# Patient Record
Sex: Female | Born: 1950 | Race: White | Hispanic: No | Marital: Married | Smoking: Current every day smoker
Health system: Southern US, Community
[De-identification: ages and names within clinical notes are randomized; demographics above are authoritative.]

## PROBLEM LIST (undated history)

## (undated) DIAGNOSIS — F329 Major depressive disorder, single episode, unspecified: Secondary | ICD-10-CM

## (undated) DIAGNOSIS — J439 Emphysema, unspecified: Secondary | ICD-10-CM

## (undated) DIAGNOSIS — G709 Myoneural disorder, unspecified: Secondary | ICD-10-CM

## (undated) DIAGNOSIS — E785 Hyperlipidemia, unspecified: Secondary | ICD-10-CM

## (undated) DIAGNOSIS — J45909 Unspecified asthma, uncomplicated: Secondary | ICD-10-CM

## (undated) DIAGNOSIS — I509 Heart failure, unspecified: Secondary | ICD-10-CM

## (undated) DIAGNOSIS — E079 Disorder of thyroid, unspecified: Secondary | ICD-10-CM

## (undated) DIAGNOSIS — Z5189 Encounter for other specified aftercare: Secondary | ICD-10-CM

## (undated) DIAGNOSIS — D649 Anemia, unspecified: Secondary | ICD-10-CM

## (undated) DIAGNOSIS — N189 Chronic kidney disease, unspecified: Secondary | ICD-10-CM

## (undated) DIAGNOSIS — K219 Gastro-esophageal reflux disease without esophagitis: Secondary | ICD-10-CM

## (undated) DIAGNOSIS — R569 Unspecified convulsions: Secondary | ICD-10-CM

## (undated) DIAGNOSIS — F32A Depression, unspecified: Secondary | ICD-10-CM

## (undated) DIAGNOSIS — J449 Chronic obstructive pulmonary disease, unspecified: Secondary | ICD-10-CM

## (undated) DIAGNOSIS — C4490 Unspecified malignant neoplasm of skin, unspecified: Secondary | ICD-10-CM

## (undated) DIAGNOSIS — G4730 Sleep apnea, unspecified: Secondary | ICD-10-CM

## (undated) DIAGNOSIS — M810 Age-related osteoporosis without current pathological fracture: Secondary | ICD-10-CM

## (undated) DIAGNOSIS — M1990 Unspecified osteoarthritis, unspecified site: Secondary | ICD-10-CM

## (undated) DIAGNOSIS — H269 Unspecified cataract: Secondary | ICD-10-CM

## (undated) DIAGNOSIS — T7840XA Allergy, unspecified, initial encounter: Secondary | ICD-10-CM

## (undated) DIAGNOSIS — E119 Type 2 diabetes mellitus without complications: Secondary | ICD-10-CM

## (undated) DIAGNOSIS — I10 Essential (primary) hypertension: Secondary | ICD-10-CM

## (undated) DIAGNOSIS — D689 Coagulation defect, unspecified: Secondary | ICD-10-CM

## (undated) DIAGNOSIS — F419 Anxiety disorder, unspecified: Secondary | ICD-10-CM

## (undated) HISTORY — PX: OTHER SURGICAL HISTORY: SHX169

## (undated) HISTORY — DX: Depression, unspecified: F32.A

## (undated) HISTORY — DX: Unspecified osteoarthritis, unspecified site: M19.90

## (undated) HISTORY — DX: Unspecified cataract: H26.9

## (undated) HISTORY — DX: Hyperlipidemia, unspecified: E78.5

## (undated) HISTORY — DX: Anemia, unspecified: D64.9

## (undated) HISTORY — DX: Emphysema, unspecified: J43.9

## (undated) HISTORY — DX: Gastro-esophageal reflux disease without esophagitis: K21.9

## (undated) HISTORY — PX: APPENDECTOMY: SHX54

## (undated) HISTORY — PX: CAROTID ENDARTERECTOMY: SUR193

## (undated) HISTORY — DX: Major depressive disorder, single episode, unspecified: F32.9

## (undated) HISTORY — DX: Myoneural disorder, unspecified: G70.9

## (undated) HISTORY — DX: Type 2 diabetes mellitus without complications: E11.9

## (undated) HISTORY — DX: Encounter for other specified aftercare: Z51.89

## (undated) HISTORY — PX: JOINT REPLACEMENT: SHX530

## (undated) HISTORY — DX: Chronic obstructive pulmonary disease, unspecified: J44.9

## (undated) HISTORY — DX: Anxiety disorder, unspecified: F41.9

## (undated) HISTORY — PX: CERVICAL SPINE SURGERY: SHX589

## (undated) HISTORY — DX: Essential (primary) hypertension: I10

## (undated) HISTORY — PX: EYE SURGERY: SHX253

## (undated) HISTORY — DX: Unspecified asthma, uncomplicated: J45.909

## (undated) HISTORY — PX: FRACTURE SURGERY: SHX138

## (undated) HISTORY — DX: Heart failure, unspecified: I50.9

## (undated) HISTORY — PX: SPINE SURGERY: SHX786

## (undated) HISTORY — DX: Unspecified convulsions: R56.9

## (undated) HISTORY — DX: Chronic kidney disease, unspecified: N18.9

## (undated) HISTORY — DX: Unspecified malignant neoplasm of skin, unspecified: C44.90

## (undated) HISTORY — DX: Allergy, unspecified, initial encounter: T78.40XA

## (undated) HISTORY — DX: Age-related osteoporosis without current pathological fracture: M81.0

## (undated) HISTORY — DX: Coagulation defect, unspecified: D68.9

## (undated) HISTORY — DX: Disorder of thyroid, unspecified: E07.9

## (undated) HISTORY — DX: Sleep apnea, unspecified: G47.30

---

## 1983-07-13 DIAGNOSIS — G4730 Sleep apnea, unspecified: Secondary | ICD-10-CM

## 1983-07-13 HISTORY — DX: Sleep apnea, unspecified: G47.30

## 2013-07-12 HISTORY — PX: SPINAL CORD STIMULATOR IMPLANT: SHX2422

## 2017-08-24 ENCOUNTER — Encounter (INDEPENDENT_AMBULATORY_CARE_PROVIDER_SITE_OTHER): Payer: Self-pay

## 2017-08-24 ENCOUNTER — Encounter: Payer: Self-pay | Admitting: Family Medicine

## 2017-08-24 ENCOUNTER — Ambulatory Visit: Payer: Medicare PPO | Admitting: Family Medicine

## 2017-08-24 VITALS — BP 124/65 | HR 89 | Temp 98.5°F | Ht 65.0 in | Wt 225.0 lb

## 2017-08-24 DIAGNOSIS — G894 Chronic pain syndrome: Secondary | ICD-10-CM

## 2017-08-24 DIAGNOSIS — J449 Chronic obstructive pulmonary disease, unspecified: Secondary | ICD-10-CM | POA: Insufficient documentation

## 2017-08-24 DIAGNOSIS — E039 Hypothyroidism, unspecified: Secondary | ICD-10-CM

## 2017-08-24 DIAGNOSIS — M19072 Primary osteoarthritis, left ankle and foot: Secondary | ICD-10-CM

## 2017-08-24 DIAGNOSIS — F339 Major depressive disorder, recurrent, unspecified: Secondary | ICD-10-CM | POA: Diagnosis not present

## 2017-08-24 DIAGNOSIS — J439 Emphysema, unspecified: Secondary | ICD-10-CM

## 2017-08-24 DIAGNOSIS — G40909 Epilepsy, unspecified, not intractable, without status epilepticus: Secondary | ICD-10-CM | POA: Diagnosis not present

## 2017-08-24 DIAGNOSIS — E785 Hyperlipidemia, unspecified: Secondary | ICD-10-CM | POA: Diagnosis not present

## 2017-08-24 DIAGNOSIS — I10 Essential (primary) hypertension: Secondary | ICD-10-CM

## 2017-08-24 DIAGNOSIS — M810 Age-related osteoporosis without current pathological fracture: Secondary | ICD-10-CM

## 2017-08-24 NOTE — Progress Notes (Signed)
BP 124/65   Pulse 89   Temp 98.5 F (36.9 C) (Oral)   Ht '5\' 5"'  (1.651 m)   Wt 225 lb (102.1 kg)   BMI 37.44 kg/m    Subjective:    Patient ID: Tasha Clarke, female    DOB: 1951-05-02, 67 y.o.   MRN: [id number]  HPI: Tasha Clarke is a 67 y.o. female presenting on 08/24/2017 for [place] (sees pain management specialist in [place], Dr. Leontine Locket)   HPI Hyperlipidemia Patient comes in to establish care with our office.  Patient is coming in for recheck of his hyperlipidemia. The patient is currently taking Lipitor. They deny any issues with myalgias or history of liver damage from it. They deny any focal numbness or weakness or chest pain.   Hypertension Patient is currently on losartan and metoprolol, and their blood pressure today is 124/65. Patient denies any lightheadedness or dizziness. Patient denies headaches, blurred vision, chest pains, shortness of breath, or weakness. Denies any side effects from medication and is content with current medication.   Hypothyroidism recheck Patient is coming in for thyroid recheck today as well. They deny any issues with hair changes or heat or cold problems or diarrhea or constipation. They deny any chest pain or palpitations. They are currently on levothyroxine 59mcrograms   COPD Has known COPD and is coming to establish care for this.  She is currently on DuoNeb and Brio Ellipta and Advair albuterol.  She has been feeling more stable on this recently and feels like her breathing is doing good.  She still does have cough and congestion and wheezing.  She is chronically on 3 L nasal cannula oxygen.  Osteoporosis Patient says she was just diagnosed with osteoporosis late last year and has been on Fosamax for about 4 or 5 months.  She has no major fractures in the past few years and has been taking the Fosamax along with vitamin D3 and calcium.  Chronic pain syndrome and left ankle arthritis Patient has chronic pain in both ankle and  back and [place] is still planning to be seen there for management of this.  She takes Cymbalta for this and for her depression as well as gabapentin.  She says she is stable on these and will continue to see them for this.  Seizure disorder Patient is currently on [place] seizure disorder and denies any seizures in the last year at least.  She does not currently have a neurologist after moving here and needs one.  Relevant past medical, surgical, family and social history reviewed and updated as indicated. Interim medical history since our last visit reviewed. Allergies and medications reviewed and updated.  Review of Systems  Constitutional: Negative for chills and fever.  HENT: Positive for congestion. Negative for ear discharge, ear pain, postnasal drip, rhinorrhea, sinus pressure, sneezing and sore throat.   Eyes: Negative for pain, redness and visual disturbance.  Respiratory: Positive for cough, shortness of breath and wheezing. Negative for chest tightness.   Cardiovascular: Negative for chest pain, palpitations and leg swelling.  Endocrine: Negative for cold intolerance and heat intolerance.  Genitourinary: Negative for difficulty urinating and dysuria.  Musculoskeletal: Positive for arthralgias and back pain. Negative for gait problem.  Skin: Negative for rash.  Neurological: Negative for seizures, light-headedness and headaches.  Psychiatric/Behavioral: Negative for agitation and behavioral problems.  All other systems reviewed and are negative.   Per HPI unless specifically indicated above  Social History  Socioeconomic History  . Marital status: Married    Spouse name: Not on file  . Number of children: Not on file  . Years of education: Not on file  . Highest education level: Not on file  Social Needs  . Financial resource strain: Not on file  . Food insecurity - worry: Not on file  . Food insecurity - inability: Not on  file  . Transportation needs - medical: Not on file  . Transportation needs - non-medical: Not on file  Occupational History  . Not on file  Tobacco Use  . Smoking status: Current Every Day Smoker    Packs/day: 0.75    Types: Cigarettes  . Smokeless tobacco: Never Used  Substance and Sexual Activity  . Alcohol use: No    Frequency: Never  . Drug use: No  . Sexual activity: Not on file  Other Topics Concern  . Not on file  Social History Narrative  . Not on file    Past Surgical History:  Procedure Laterality Date  . APPENDECTOMY    . EYE SURGERY    . FRACTURE SURGERY    . heart stents    . JOINT REPLACEMENT    . SPINAL CORD STIMULATOR IMPLANT  2015  . SPINE SURGERY      Family History  Problem Relation Age of Onset  . Arthritis Mother   . Asthma Mother   . Cancer Mother        lung  . Depression Mother   . Diabetes Mother   . Arthritis Father   . Asthma Father   . Depression Father   . Asthma Sister   . Depression Sister   . Diabetes Sister   . Arthritis Maternal Grandmother   . Asthma Maternal Grandmother   . Depression Maternal Grandmother   . Arthritis Maternal Grandfather   . Asthma Maternal Grandfather   . Arthritis Paternal Grandmother   . Asthma Paternal Grandmother   . Arthritis Paternal Grandfather   . Asthma Paternal Grandfather   . COPD Paternal Grandfather     Allergies as of 08/24/2017      Reactions   Cephalexin Hives   Ketorolac Tromethamine Hives   Lac Bovis Nausea And Vomiting   Imdur  [isosorbide Dinitrate]    Iodinated Diagnostic Agents    Other Other (See Comments)   Alka seltzer causes blurred vision, fainting   Sodium Bicarbonate-citric Acid    Milk-related Compounds Nausea And Vomiting      Medication List        Accurate as of 08/24/17 10:52 AM. Always use your most recent med list.          albuterol 108 (90 Base) MCG/ACT inhaler Commonly known as:  PROVENTIL HFA;VENTOLIN HFA Inhale 2 puffs into the lungs every 6  (six) hours as needed for wheezing or shortness of breath.   alendronate 70 MG tablet Commonly known as:  FOSAMAX Take 70 mg by mouth once a week. Take with a full glass of water on an empty stomach.   aspirin EC 81 MG tablet Take 81 mg by mouth daily.   atorvastatin 40 MG tablet Commonly known as:  LIPITOR Take 40 mg by mouth daily.   BREO ELLIPTA 200-25 MCG/INH Aepb Generic drug:  fluticasone furoate-vilanterol Inhale 1 puff into the lungs daily.   cetirizine 10 MG tablet Commonly known as:  ZYRTEC Take 10 mg by mouth daily.   cyclobenzaprine 10 MG tablet Commonly known as:  FLEXERIL Take 10 mg  by mouth 2 (two) times daily as needed for muscle spasms.   DULoxetine 30 MG capsule Commonly known as:  CYMBALTA Take 30 mg by mouth daily.   EPINEPHrine 0.3 mg/0.3 mL Soaj injection Commonly known as:  EPI-PEN Inject 0.3 mg into the muscle as needed.   Fish Oil 1000 MG Caps Take 1,000 mg by mouth daily.   FLONASE 50 MCG/ACT nasal spray Generic drug:  fluticasone Place 2 sprays into both nostrils daily.   Fluticasone-Salmeterol 250-50 MCG/DOSE Aepb Commonly known as:  ADVAIR Inhale 1 puff into the lungs 2 (two) times daily.   gabapentin 600 MG tablet Commonly known as:  NEURONTIN Take 600 mg by mouth 4 (four) times daily.   ipratropium-albuterol 0.5-2.5 (3) MG/3ML Soln Commonly known as:  DUONEB Take 3 mLs by nebulization every 6 (six) hours as needed.   Iron 325 (65 Fe) MG Tabs Take 68 mg by mouth daily.   levETIRAcetam 750 MG tablet Commonly known as:  KEPPRA Take 750 mg by mouth every morning.   levETIRAcetam 1000 MG tablet Commonly known as:  KEPPRA Take 1,000 mg by mouth at bedtime.   levothyroxine 50 MCG tablet Commonly known as:  SYNTHROID, LEVOTHROID Take 50 mcg by mouth daily before breakfast.   losartan 50 MG tablet Commonly known as:  COZAAR Take 50 mg by mouth daily.   Magnesium 400 MG Caps Take 400 mg by mouth daily.   metoprolol  tartrate 25 MG tablet Commonly known as:  LOPRESSOR Take 25 mg by mouth 2 (two) times daily.   Morphine-Naltrexone 20-0.8 MG Cpcr Take 1 capsule by mouth 2 (two) times daily.   multivitamin tablet Take 1 tablet by mouth daily.   NARCAN 4 MG/0.1ML Liqd nasal spray kit Generic drug:  naloxone Place 1 spray into the nose.   TYLENOL PM EXTRA STRENGTH 25-500 MG Tabs tablet Generic drug:  diphenhydramine-acetaminophen Take 1 tablet by mouth at bedtime as needed.   Vitamin D-3 5000 units Tabs Take 5,000 Units by mouth daily.          Objective:    BP 124/65   Pulse 89   Temp 98.5 F (36.9 C) (Oral)   Ht '5\' 5"'  (1.651 m)   Wt 225 lb (102.1 kg)   BMI 37.44 kg/m   Wt Readings from Last 3 Encounters:  08/24/17 225 lb (102.1 kg)    Physical Exam  Constitutional: She is oriented to person, place, and time. She appears well-developed and well-nourished. No distress.  Eyes: Conjunctivae are normal.  Neck: Neck supple. No thyromegaly present.  Cardiovascular: Normal rate, regular rhythm, normal heart sounds and intact distal pulses.  No murmur heard. Pulmonary/Chest: Effort normal. No respiratory distress. She has wheezes. She has no rales. She exhibits no tenderness.  Musculoskeletal: Normal range of motion. She exhibits edema (Trace edema).  Lymphadenopathy:    She has no cervical adenopathy.  Neurological: She is alert and oriented to person, place, and time. Coordination normal.  Skin: Skin is warm and dry. No rash noted. She is not diaphoretic.  Psychiatric: She has a normal mood and affect. Her behavior is normal.  Nursing note and vitals reviewed.   No results found for this or any previous visit.    Assessment & Plan:   Problem List Items Addressed This Visit      Cardiovascular and Mediastinum   Hypertension - Primary   Relevant Medications   atorvastatin (LIPITOR) 40 MG tablet   losartan (COZAAR) 50 MG tablet   metoprolol  tartrate (LOPRESSOR) 25 MG tablet    EPINEPHrine 0.3 mg/0.3 mL IJ SOAJ injection   aspirin EC 81 MG tablet     Respiratory   COPD (chronic obstructive pulmonary disease) (HCC)   Relevant Medications   fluticasone (FLONASE) 50 MCG/ACT nasal spray   fluticasone furoate-vilanterol (BREO ELLIPTA) 200-25 MCG/INH AEPB   cetirizine (ZYRTEC) 10 MG tablet   ipratropium-albuterol (DUONEB) 0.5-2.5 (3) MG/3ML SOLN   albuterol (PROVENTIL HFA;VENTOLIN HFA) 108 (90 Base) MCG/ACT inhaler   Fluticasone-Salmeterol (ADVAIR) 250-50 MCG/DOSE AEPB   Other Relevant Orders   CBC with Differential/Platelet (Completed)   Ambulatory referral to Pulmonology     Endocrine   Hypothyroid   Relevant Medications   levothyroxine (SYNTHROID, LEVOTHROID) 50 MCG tablet   metoprolol tartrate (LOPRESSOR) 25 MG tablet   Other Relevant Orders   TSH (Completed)     Nervous and Auditory   Seizure disorder (HCC)   Relevant Medications   gabapentin (NEURONTIN) 600 MG tablet   levETIRAcetam (KEPPRA) 750 MG tablet   levETIRAcetam (KEPPRA) 1000 MG tablet   diphenhydramine-acetaminophen (TYLENOL PM EXTRA STRENGTH) 25-500 MG TABS tablet   Other Relevant Orders   CMP14+EGFR (Completed)     Musculoskeletal and Integument   Osteoporosis   Relevant Medications   alendronate (FOSAMAX) 70 MG tablet   Cholecalciferol (VITAMIN D-3) 5000 units TABS     Other   Hyperlipidemia LDL goal <100   Relevant Medications   atorvastatin (LIPITOR) 40 MG tablet   losartan (COZAAR) 50 MG tablet   metoprolol tartrate (LOPRESSOR) 25 MG tablet   EPINEPHrine 0.3 mg/0.3 mL IJ SOAJ injection   aspirin EC 81 MG tablet   Other Relevant Orders   Lipid panel (Completed)   Depression, recurrent (HCC)   Relevant Medications   DULoxetine (CYMBALTA) 30 MG capsule   Other Relevant Orders   CBC with Differential/Platelet (Completed)   Ambulatory referral to Psychiatry   Chronic pain syndrome    Other Visit Diagnoses    Arthritis of left ankle       Relevant Medications    cyclobenzaprine (FLEXERIL) 10 MG tablet   Morphine-Naltrexone 20-0.8 MG CPCR   aspirin EC 81 MG tablet   Other Relevant Orders   Ambulatory referral to Orthopedic Surgery       Follow up plan: Return in about 4 weeks (around 09/21/2017), or if symptoms worsen or fail to improve, for depression recheck and htn .  Caryl Pina, MD [facility] 08/24/2017, 10:52 AM

## 2017-08-25 ENCOUNTER — Inpatient Hospital Stay (HOSPITAL_COMMUNITY)
Admission: EM | Admit: 2017-08-25 | Discharge: 2017-08-27 | DRG: 812 | Disposition: A | Discharge status: Home / Self Care | Payer: Medicare Other | Attending: Family Medicine | Admitting: Family Medicine

## 2017-08-25 ENCOUNTER — Encounter (HOSPITAL_COMMUNITY): Payer: Self-pay | Admitting: Emergency Medicine

## 2017-08-25 ENCOUNTER — Emergency Department (HOSPITAL_COMMUNITY): Payer: Medicare Other

## 2017-08-25 ENCOUNTER — Other Ambulatory Visit: Payer: Self-pay

## 2017-08-25 ENCOUNTER — Inpatient Hospital Stay (HOSPITAL_COMMUNITY): Payer: Medicare Other

## 2017-08-25 ENCOUNTER — Encounter: Payer: Self-pay | Admitting: Family Medicine

## 2017-08-25 DIAGNOSIS — E039 Hypothyroidism, unspecified: Secondary | ICD-10-CM | POA: Diagnosis present

## 2017-08-25 DIAGNOSIS — Z79899 Other long term (current) drug therapy: Secondary | ICD-10-CM | POA: Diagnosis not present

## 2017-08-25 DIAGNOSIS — G894 Chronic pain syndrome: Secondary | ICD-10-CM | POA: Diagnosis present

## 2017-08-25 DIAGNOSIS — Z7982 Long term (current) use of aspirin: Secondary | ICD-10-CM | POA: Diagnosis not present

## 2017-08-25 DIAGNOSIS — F329 Major depressive disorder, single episode, unspecified: Secondary | ICD-10-CM | POA: Diagnosis present

## 2017-08-25 DIAGNOSIS — F17210 Nicotine dependence, cigarettes, uncomplicated: Secondary | ICD-10-CM | POA: Diagnosis present

## 2017-08-25 DIAGNOSIS — Z91041 Radiographic dye allergy status: Secondary | ICD-10-CM

## 2017-08-25 DIAGNOSIS — Z91018 Allergy to other foods: Secondary | ICD-10-CM

## 2017-08-25 DIAGNOSIS — D649 Anemia, unspecified: Principal | ICD-10-CM

## 2017-08-25 DIAGNOSIS — E785 Hyperlipidemia, unspecified: Secondary | ICD-10-CM | POA: Diagnosis present

## 2017-08-25 DIAGNOSIS — Z833 Family history of diabetes mellitus: Secondary | ICD-10-CM | POA: Diagnosis not present

## 2017-08-25 DIAGNOSIS — Z818 Family history of other mental and behavioral disorders: Secondary | ICD-10-CM

## 2017-08-25 DIAGNOSIS — Z825 Family history of asthma and other chronic lower respiratory diseases: Secondary | ICD-10-CM | POA: Diagnosis not present

## 2017-08-25 DIAGNOSIS — D509 Iron deficiency anemia, unspecified: Secondary | ICD-10-CM

## 2017-08-25 DIAGNOSIS — I509 Heart failure, unspecified: Secondary | ICD-10-CM

## 2017-08-25 DIAGNOSIS — J309 Allergic rhinitis, unspecified: Secondary | ICD-10-CM | POA: Diagnosis present

## 2017-08-25 DIAGNOSIS — Z7983 Long term (current) use of bisphosphonates: Secondary | ICD-10-CM | POA: Diagnosis not present

## 2017-08-25 DIAGNOSIS — R0602 Shortness of breath: Secondary | ICD-10-CM | POA: Diagnosis present

## 2017-08-25 DIAGNOSIS — Z79890 Hormone replacement therapy (postmenopausal): Secondary | ICD-10-CM

## 2017-08-25 DIAGNOSIS — Z888 Allergy status to other drugs, medicaments and biological substances status: Secondary | ICD-10-CM

## 2017-08-25 DIAGNOSIS — J449 Chronic obstructive pulmonary disease, unspecified: Secondary | ICD-10-CM | POA: Diagnosis present

## 2017-08-25 DIAGNOSIS — M810 Age-related osteoporosis without current pathological fracture: Secondary | ICD-10-CM | POA: Diagnosis present

## 2017-08-25 DIAGNOSIS — Z955 Presence of coronary angioplasty implant and graft: Secondary | ICD-10-CM

## 2017-08-25 DIAGNOSIS — I110 Hypertensive heart disease with heart failure: Secondary | ICD-10-CM | POA: Diagnosis present

## 2017-08-25 DIAGNOSIS — F339 Major depressive disorder, recurrent, unspecified: Secondary | ICD-10-CM | POA: Diagnosis present

## 2017-08-25 DIAGNOSIS — G40909 Epilepsy, unspecified, not intractable, without status epilepticus: Secondary | ICD-10-CM | POA: Diagnosis present

## 2017-08-25 LAB — URINALYSIS, ROUTINE W REFLEX MICROSCOPIC
BILIRUBIN URINE: NEGATIVE
Glucose, UA: NEGATIVE mg/dL
Hgb urine dipstick: NEGATIVE
Ketones, ur: NEGATIVE mg/dL
Nitrite: POSITIVE — AB
PROTEIN: NEGATIVE mg/dL
SPECIFIC GRAVITY, URINE: 1.008 (ref 1.005–1.030)
pH: 5 (ref 5.0–8.0)

## 2017-08-25 LAB — RETICULOCYTES
RBC.: 3.3 MIL/uL — ABNORMAL LOW (ref 3.87–5.11)
Retic Count, Absolute: 85.8 10*3/uL (ref 19.0–186.0)
Retic Ct Pct: 2.6 % (ref 0.4–3.1)

## 2017-08-25 LAB — CMP14+EGFR
ALK PHOS: 92 IU/L (ref 39–117)
ALT: 14 IU/L (ref 0–32)
AST: 19 IU/L (ref 0–40)
Albumin/Globulin Ratio: 1.3 (ref 1.2–2.2)
Albumin: 3.5 g/dL — ABNORMAL LOW (ref 3.6–4.8)
BUN / CREAT RATIO: 24 (ref 12–28)
BUN: 20 mg/dL (ref 8–27)
Bilirubin Total: <0.2 mg/dL (ref 0.0–1.2)
CO2: 20 mmol/L (ref 20–29)
Calcium: 9.2 mg/dL (ref 8.7–10.3)
Chloride: 106 mmol/L (ref 96–106)
Creatinine, Ser: 0.82 mg/dL (ref 0.57–1.00)
GFR calc Af Amer: 87 mL/min/{1.73_m2} (ref >59)
GFR, EST NON AFRICAN AMERICAN: 75 mL/min/{1.73_m2} (ref >59)
GLOBULIN, TOTAL: 2.7 g/dL (ref 1.5–4.5)
Glucose: 80 mg/dL (ref 65–99)
Potassium: 5.2 mmol/L (ref 3.5–5.2)
Sodium: 139 mmol/L (ref 134–144)
Total Protein: 6.2 g/dL (ref 6.0–8.5)

## 2017-08-25 LAB — CBC WITH DIFFERENTIAL/PLATELET
BASOS: 0 %
Basophils Absolute: 0 10*3/uL (ref 0.0–0.2)
Basophils Absolute: 0 10*3/uL (ref 0.0–0.1)
Basophils Relative: 0 %
EOS (ABSOLUTE): 0.2 10*3/uL (ref 0.0–0.4)
EOS PCT: 4 %
EOS: 3 %
Eosinophils Absolute: 0.3 10*3/uL (ref 0.0–0.7)
HCT: 24.8 % — ABNORMAL LOW (ref 36.0–46.0)
HEMATOCRIT: 24.1 % — AB (ref 34.0–46.6)
Hemoglobin: 6.6 g/dL — CL (ref 11.1–15.9)
Hemoglobin: 6.7 g/dL — CL (ref 12.0–15.0)
Immature Grans (Abs): 0 10*3/uL (ref 0.0–0.1)
Immature Granulocytes: 0 %
LYMPHS ABS: 1.5 10*3/uL (ref 0.7–3.1)
LYMPHS ABS: 1.7 10*3/uL (ref 0.7–4.0)
LYMPHS PCT: 21 %
Lymphs: 21 %
MCH: 20.1 pg — ABNORMAL LOW (ref 26.6–33.0)
MCH: 20.7 pg — AB (ref 26.0–34.0)
MCHC: 27.4 g/dL — AB (ref 31.5–35.7)
MCHC: 27 g/dL — ABNORMAL LOW (ref 30.0–36.0)
MCV: 74 fL — AB (ref 79–97)
MCV: 76.5 fL — ABNORMAL LOW (ref 78.0–100.0)
MONO ABS: 0.8 10*3/uL (ref 0.1–1.0)
MONOS ABS: 0.9 10*3/uL (ref 0.1–0.9)
MONOS PCT: 10 %
Monocytes: 12 %
NEUTROS ABS: 4.5 10*3/uL (ref 1.4–7.0)
Neutro Abs: 5.4 10*3/uL (ref 1.7–7.7)
Neutrophils Relative %: 65 %
Neutrophils: 64 %
PLATELETS: 301 10*3/uL (ref 150–400)
Platelets: 259 10*3/uL (ref 150–379)
RBC: 3.28 x10E6/uL — ABNORMAL LOW (ref 3.77–5.28)
RBC: 3.24 MIL/uL — ABNORMAL LOW (ref 3.87–5.11)
RDW: 20.3 % — AB (ref 12.3–15.4)
RDW: 20.5 % — AB (ref 11.5–15.5)
WBC: 7.1 10*3/uL (ref 3.4–10.8)
WBC: 8.2 10*3/uL (ref 4.0–10.5)

## 2017-08-25 LAB — COMPREHENSIVE METABOLIC PANEL
ALBUMIN: 3.5 g/dL (ref 3.5–5.0)
ALK PHOS: 92 U/L (ref 38–126)
ALT: 15 U/L (ref 14–54)
ANION GAP: 9 (ref 5–15)
AST: 22 U/L (ref 15–41)
BUN: 24 mg/dL — ABNORMAL HIGH (ref 6–20)
CO2: 24 mmol/L (ref 22–32)
Calcium: 9.3 mg/dL (ref 8.9–10.3)
Chloride: 105 mmol/L (ref 101–111)
Creatinine, Ser: 0.95 mg/dL (ref 0.44–1.00)
GFR calc Af Amer: >60 mL/min (ref >60)
GFR calc non Af Amer: >60 mL/min (ref >60)
Glucose, Bld: 108 mg/dL — ABNORMAL HIGH (ref 65–99)
POTASSIUM: 4.2 mmol/L (ref 3.5–5.1)
SODIUM: 138 mmol/L (ref 135–145)
Total Bilirubin: 0.4 mg/dL (ref 0.3–1.2)
Total Protein: 6.8 g/dL (ref 6.5–8.1)

## 2017-08-25 LAB — LIPID PANEL
CHOL/HDL RATIO: 5.2 ratio — AB (ref 0.0–4.4)
Cholesterol, Total: 134 mg/dL (ref 100–199)
HDL: 26 mg/dL — ABNORMAL LOW (ref >39)
LDL Calculated: 75 mg/dL (ref 0–99)
TRIGLYCERIDES: 164 mg/dL — AB (ref 0–149)
VLDL Cholesterol Cal: 33 mg/dL (ref 5–40)

## 2017-08-25 LAB — TSH: TSH: 3.5 u[IU]/mL (ref 0.450–4.500)

## 2017-08-25 LAB — BRAIN NATRIURETIC PEPTIDE: B Natriuretic Peptide: 202.3 pg/mL — ABNORMAL HIGH (ref 0.0–100.0)

## 2017-08-25 LAB — ABO/RH: ABO/RH(D): B POS

## 2017-08-25 LAB — TROPONIN I: TROPONIN I: 0.03 ng/mL — AB (ref <0.03)

## 2017-08-25 LAB — SAVE SMEAR

## 2017-08-25 LAB — POC OCCULT BLOOD, ED: Fecal Occult Bld: NEGATIVE

## 2017-08-25 LAB — DIRECT ANTIGLOBULIN TEST (NOT AT ARMC)
DAT, IgG: NEGATIVE
DAT, complement: NEGATIVE

## 2017-08-25 LAB — PREPARE RBC (CROSSMATCH)

## 2017-08-25 LAB — LACTATE DEHYDROGENASE: LDH: 153 U/L (ref 98–192)

## 2017-08-25 MED ORDER — VITAMIN D 1000 UNITS PO TABS
5000.0000 [IU] | ORAL_TABLET | Freq: Every day | ORAL | Status: DC
  Administered 2017-08-26 – 2017-08-27 (×2): 5000 [IU] via ORAL
  Filled 2017-08-25 (×2): qty 5

## 2017-08-25 MED ORDER — OXYCODONE HCL 5 MG PO TABS
5.0000 mg | ORAL_TABLET | ORAL | Status: DC | PRN
  Administered 2017-08-25 – 2017-08-27 (×4): 5 mg via ORAL
  Filled 2017-08-25 (×4): qty 1

## 2017-08-25 MED ORDER — DULOXETINE HCL 30 MG PO CPEP
30.0000 mg | ORAL_CAPSULE | Freq: Every day | ORAL | Status: DC
  Administered 2017-08-25 – 2017-08-27 (×3): 30 mg via ORAL
  Filled 2017-08-25 (×3): qty 1

## 2017-08-25 MED ORDER — ACETAMINOPHEN 500 MG PO TABS: 500.0000 mg | ORAL_TABLET | Freq: Every evening | ORAL | Status: DC | PRN

## 2017-08-25 MED ORDER — ASPIRIN EC 81 MG PO TBEC
81.0000 mg | DELAYED_RELEASE_TABLET | Freq: Every day | ORAL | Status: DC
  Administered 2017-08-25 – 2017-08-27 (×3): 81 mg via ORAL
  Filled 2017-08-25 (×3): qty 1

## 2017-08-25 MED ORDER — LEVETIRACETAM 500 MG PO TABS
750.0000 mg | ORAL_TABLET | Freq: Every day | ORAL | Status: DC
  Administered 2017-08-26 – 2017-08-27 (×2): 750 mg via ORAL
  Filled 2017-08-25 (×2): qty 1

## 2017-08-25 MED ORDER — ALBUTEROL SULFATE HFA 108 (90 BASE) MCG/ACT IN AERS: 2.0000 | INHALATION_SPRAY | Freq: Four times a day (QID) | RESPIRATORY_TRACT | Status: DC | PRN

## 2017-08-25 MED ORDER — METOPROLOL TARTRATE 25 MG PO TABS
25.0000 mg | ORAL_TABLET | Freq: Two times a day (BID) | ORAL | Status: DC
  Administered 2017-08-25 – 2017-08-26 (×3): 25 mg via ORAL
  Filled 2017-08-25 (×4): qty 1

## 2017-08-25 MED ORDER — ONDANSETRON HCL 4 MG/2ML IJ SOLN: 4.0000 mg | Freq: Four times a day (QID) | INTRAMUSCULAR | Status: DC | PRN

## 2017-08-25 MED ORDER — FERROUS SULFATE 325 (65 FE) MG PO TABS
325.0000 mg | ORAL_TABLET | Freq: Every day | ORAL | Status: DC
  Administered 2017-08-26: 325 mg via ORAL
  Filled 2017-08-25: qty 1

## 2017-08-25 MED ORDER — GABAPENTIN 300 MG PO CAPS
600.0000 mg | ORAL_CAPSULE | Freq: Four times a day (QID) | ORAL | Status: DC
  Administered 2017-08-25 – 2017-08-27 (×9): 600 mg via ORAL
  Filled 2017-08-25 (×9): qty 2

## 2017-08-25 MED ORDER — ACETAMINOPHEN 325 MG PO TABS: 650.0000 mg | ORAL_TABLET | Freq: Four times a day (QID) | ORAL | Status: DC | PRN

## 2017-08-25 MED ORDER — CYCLOBENZAPRINE HCL 10 MG PO TABS: 10.0000 mg | ORAL_TABLET | Freq: Two times a day (BID) | ORAL | Status: DC | PRN

## 2017-08-25 MED ORDER — ENOXAPARIN SODIUM 60 MG/0.6ML ~~LOC~~ SOLN
50.0000 mg | SUBCUTANEOUS | Status: DC
  Administered 2017-08-25 – 2017-08-26 (×2): 50 mg via SUBCUTANEOUS
  Filled 2017-08-25 (×2): qty 0.6

## 2017-08-25 MED ORDER — OMEGA-3-ACID ETHYL ESTERS 1 G PO CAPS
1.0000 g | ORAL_CAPSULE | Freq: Two times a day (BID) | ORAL | Status: DC
  Administered 2017-08-25 – 2017-08-27 (×4): 1 g via ORAL
  Filled 2017-08-25 (×4): qty 1

## 2017-08-25 MED ORDER — IRON 325 (65 FE) MG PO TABS: 68.0000 mg | ORAL_TABLET | Freq: Every day | ORAL | Status: DC

## 2017-08-25 MED ORDER — MORPHINE-NALTREXONE 20-0.8 MG PO CPCR
1.0000 | ORAL_CAPSULE | Freq: Two times a day (BID) | ORAL | Status: DC
  Administered 2017-08-26 (×3): 1 via ORAL

## 2017-08-25 MED ORDER — LOSARTAN POTASSIUM 50 MG PO TABS
50.0000 mg | ORAL_TABLET | Freq: Every day | ORAL | Status: DC
  Administered 2017-08-25 – 2017-08-27 (×3): 50 mg via ORAL
  Filled 2017-08-25 (×3): qty 1

## 2017-08-25 MED ORDER — FLUTICASONE FUROATE-VILANTEROL 200-25 MCG/INH IN AEPB
1.0000 | INHALATION_SPRAY | Freq: Every day | RESPIRATORY_TRACT | Status: DC
  Filled 2017-08-25: qty 28

## 2017-08-25 MED ORDER — DIPHENHYDRAMINE HCL 25 MG PO CAPS: 25.0000 mg | ORAL_CAPSULE | Freq: Every evening | ORAL | Status: DC | PRN

## 2017-08-25 MED ORDER — ALBUTEROL SULFATE (2.5 MG/3ML) 0.083% IN NEBU: 2.5000 mg | INHALATION_SOLUTION | Freq: Four times a day (QID) | RESPIRATORY_TRACT | Status: DC | PRN

## 2017-08-25 MED ORDER — FLUTICASONE PROPIONATE 50 MCG/ACT NA SUSP
2.0000 | Freq: Every day | NASAL | Status: DC
  Filled 2017-08-25: qty 16

## 2017-08-25 MED ORDER — LEVETIRACETAM 500 MG PO TABS
1000.0000 mg | ORAL_TABLET | Freq: Every day | ORAL | Status: DC
  Administered 2017-08-25 – 2017-08-26 (×2): 1000 mg via ORAL
  Filled 2017-08-25 (×2): qty 2

## 2017-08-25 MED ORDER — ATORVASTATIN CALCIUM 40 MG PO TABS
40.0000 mg | ORAL_TABLET | Freq: Every day | ORAL | Status: DC
  Administered 2017-08-25 – 2017-08-27 (×3): 40 mg via ORAL
  Filled 2017-08-25 (×3): qty 1

## 2017-08-25 MED ORDER — IPRATROPIUM-ALBUTEROL 0.5-2.5 (3) MG/3ML IN SOLN
3.0000 mL | Freq: Once | RESPIRATORY_TRACT | Status: AC
  Administered 2017-08-25: 3 mL via RESPIRATORY_TRACT
  Filled 2017-08-25: qty 3

## 2017-08-25 MED ORDER — ALENDRONATE SODIUM 70 MG PO TABS: 70.0000 mg | ORAL_TABLET | ORAL | Status: DC

## 2017-08-25 MED ORDER — FUROSEMIDE 10 MG/ML IJ SOLN
40.0000 mg | Freq: Once | INTRAMUSCULAR | Status: AC
  Administered 2017-08-25: 40 mg via INTRAVENOUS
  Filled 2017-08-25: qty 4

## 2017-08-25 MED ORDER — LORATADINE 10 MG PO TABS
10.0000 mg | ORAL_TABLET | Freq: Every day | ORAL | Status: DC
  Administered 2017-08-26 – 2017-08-27 (×2): 10 mg via ORAL
  Filled 2017-08-25 (×2): qty 1

## 2017-08-25 MED ORDER — ADULT MULTIVITAMIN W/MINERALS CH
1.0000 | ORAL_TABLET | Freq: Every day | ORAL | Status: DC
  Administered 2017-08-26 – 2017-08-27 (×2): 1 via ORAL
  Filled 2017-08-25 (×2): qty 1

## 2017-08-25 MED ORDER — SODIUM CHLORIDE 0.9 % IV SOLN
Freq: Once | INTRAVENOUS | Status: AC
  Administered 2017-08-25: 15:00:00 via INTRAVENOUS

## 2017-08-25 MED ORDER — DIPHENHYDRAMINE-APAP (SLEEP) 25-500 MG PO TABS: 1.0000 | ORAL_TABLET | Freq: Every evening | ORAL | Status: DC | PRN

## 2017-08-25 MED ORDER — FUROSEMIDE 10 MG/ML IJ SOLN
40.0000 mg | Freq: Two times a day (BID) | INTRAMUSCULAR | Status: DC
  Administered 2017-08-25 – 2017-08-27 (×5): 40 mg via INTRAVENOUS
  Filled 2017-08-25 (×5): qty 4

## 2017-08-25 MED ORDER — POLYETHYLENE GLYCOL 3350 17 G PO PACK: 17.0000 g | PACK | Freq: Every day | ORAL | Status: DC | PRN

## 2017-08-25 MED ORDER — ACETAMINOPHEN 650 MG RE SUPP: 650.0000 mg | Freq: Four times a day (QID) | RECTAL | Status: DC | PRN

## 2017-08-25 MED ORDER — ONDANSETRON HCL 4 MG PO TABS: 4.0000 mg | ORAL_TABLET | Freq: Four times a day (QID) | ORAL | Status: DC | PRN

## 2017-08-25 MED ORDER — LEVOTHYROXINE SODIUM 50 MCG PO TABS
50.0000 ug | ORAL_TABLET | Freq: Every day | ORAL | Status: DC
  Administered 2017-08-26 – 2017-08-27 (×2): 50 ug via ORAL
  Filled 2017-08-25 (×2): qty 1

## 2017-08-25 MED ORDER — MOMETASONE FURO-FORMOTEROL FUM 200-5 MCG/ACT IN AERO
2.0000 | INHALATION_SPRAY | Freq: Two times a day (BID) | RESPIRATORY_TRACT | Status: DC
  Administered 2017-08-25 – 2017-08-27 (×4): 2 via RESPIRATORY_TRACT
  Filled 2017-08-25: qty 8.8

## 2017-08-25 NOTE — ED Triage Notes (Signed)
Patient reports that she saw her PCP yesterday and was called today saying that HGB was low (6.6) and needs a transfusion. Pt has hx of transfusions. Denies any bleeding or dark stools that she is aware of.

## 2017-08-25 NOTE — ED Notes (Signed)
Date and time results received: 08/25/17 1204  Test: Troponin 1 Critical Value: 0.03  Name of Provider Notified: [facility]  Orders Received? Or Actions Taken?: Awaiting orders

## 2017-08-25 NOTE — ED Notes (Signed)
Xray at bedside to take to xray. Pt given warm blanket and aware will have IV started with her return.

## 2017-08-25 NOTE — ED Notes (Signed)
ED TO INPATIENT HANDOFF REPORT  Name/Age/Gender Tasha Clarke 67 y.o. female  Code Status    Code Status Orders  (From admission, onward)        Start     Ordered   08/25/17 1515  Full code  Continuous     08/25/17 1514    Code Status History    Date Active Date Inactive Code Status Order ID Comments User Context   This patient has a current code status but no historical code status.    Advance Directive Documentation     Most Recent Value  Type of Advance Directive  Healthcare Power of Attorney, Living will  Pre-existing out of facility DNR order (yellow form or pink MOST form)  No data  "MOST" Form in Place?  No data      Home/SNF/Other Home  Chief Complaint Needs Transfusion  Level of Care/Admitting Diagnosis ED Disposition    ED Disposition Condition Comment   Admit  Hospital Area: [facility] [[id number]]  Level of Care: Telemetry [5]  Admit to tele based on following criteria: Acute CHF  Diagnosis: Anemia [242683]  Admitting Physician: Cristy Folks [[id number]]  Attending Physician: Cristy Folks [phone]  Estimated length of stay: past midnight tomorrow  Certification:: I certify this patient will need inpatient services for at least 2 midnights  PT Class (Do Not Modify): Inpatient [101]  PT Acc Code (Do Not Modify): Private [1]       Medical History Past Medical History:  Diagnosis Date  . Allergy   . Anemia   . Anxiety   . Arthritis   . Asthma   . Blood transfusion without reported diagnosis   . Cancer ([place])   . Cataract   . CHF (congestive heart failure) ([place])   . Chronic kidney disease   . Clotting disorder ([place])   . COPD (chronic obstructive pulmonary disease) ([place])   . Depression   . Diabetes mellitus without complication ([place])   . Emphysema of lung ([place])   . GERD (gastroesophageal reflux disease)   . Hyperlipidemia   . Hypertension   . Neuromuscular disorder ([place])   . Osteoporosis   . Oxygen deficiency    . Seizures ([place])   . Sleep apnea 1985   wears cpap nightly  . Thyroid disease     Allergies Allergies  Allergen Reactions  . Cephalexin Hives  . Ketorolac Tromethamine Hives  . Lac Bovis Nausea And Vomiting  . Imdur  [Isosorbide Dinitrate]   . Iodinated Diagnostic Agents   . Other Other (See Comments)    Alka seltzer causes blurred vision, fainting  . Sodium Bicarbonate-Citric Acid   . Milk-Related Compounds Nausea And Vomiting    IV Location/Drains/Wounds Patient Lines/Drains/Airways Status   Active Line/Drains/Airways    Name:   Placement date:   Placement time:   Site:   Days:   Peripheral IV 08/25/17 Left Forearm   08/25/17    1120    Forearm   less than 1          Labs/Imaging Results for orders placed or performed during the hospital encounter of 08/25/17 (from the past 48 hour(s))  ABO/Rh     Status: None   Collection Time: 08/25/17 10:11 AM  Result Value Ref Range   ABO/RH(D)      B POS Performed at [facility], [place] [address], [place], [place] [address]   Comprehensive metabolic panel     Status: Abnormal   Collection Time:  08/25/17 10:20 AM  Result Value Ref Range   Sodium 138 135 - 145 mmol/L   Potassium 4.2 3.5 - 5.1 mmol/L   Chloride 105 101 - 111 mmol/L   CO2 24 22 - 32 mmol/L   Glucose, Bld 108 (H) 65 - 99 mg/dL   BUN 24 (H) 6 - 20 mg/dL   Creatinine, Ser 0.95 0.44 - 1.00 mg/dL   Calcium 9.3 8.9 - 10.3 mg/dL   Total Protein 6.8 6.5 - 8.1 g/dL   Albumin 3.5 3.5 - 5.0 g/dL   AST 22 15 - 41 U/L   ALT 15 14 - 54 U/L   Alkaline Phosphatase 92 38 - 126 U/L   Total Bilirubin 0.4 0.3 - 1.2 mg/dL   GFR calc non Af Amer >60 >60 mL/min   GFR calc Af Amer >60 >60 mL/min    Comment: (NOTE) The eGFR has been calculated using the CKD EPI equation. This calculation has not been validated in all clinical situations. eGFR's persistently <60 mL/min signify possible Chronic Kidney Disease.    Anion gap 9 5 - 15    Comment: Performed at  [facility], [place] [address], [place], [place] [address]  Type and screen [place]     Status: None (Preliminary result)   Collection Time: 08/25/17 10:20 AM  Result Value Ref Range   ABO/RH(D) B POS    Antibody Screen NEG    Sample Expiration 08/28/2017    Unit Number [id number]    Blood Component Type RED CELLS,LR    Unit division 00    Status of Unit ISSUED    Transfusion Status OK TO TRANSFUSE    Crossmatch Result      Compatible Performed at [facility], [place] [address], [place], [place] [address]   CBC with Differential/Platelet     Status: Abnormal   Collection Time: 08/25/17 10:20 AM  Result Value Ref Range   WBC 8.2 4.0 - 10.5 K/uL   RBC 3.24 (L) 3.87 - 5.11 MIL/uL   Hemoglobin 6.7 (LL) 12.0 - 15.0 g/dL    Comment: REPEATED TO VERIFY CRITICAL RESULT CALLED TO, READ BACK BY AND VERIFIED WITH:  YOSIF, M AT 1105 ON 08/25/17 BY MOHAMED, A    HCT 24.8 (L) 36.0 - 46.0 %   MCV 76.5 (L) 78.0 - 100.0 fL   MCH 20.7 (L) 26.0 - 34.0 pg   MCHC 27.0 (L) 30.0 - 36.0 g/dL   RDW 20.5 (H) 11.5 - 15.5 %   Platelets 301 150 - 400 K/uL   Neutrophils Relative % 65 %   Neutro Abs 5.4 1.7 - 7.7 K/uL   Lymphocytes Relative 21 %   Lymphs Abs 1.7 0.7 - 4.0 K/uL   Monocytes Relative 10 %   Monocytes Absolute 0.8 0.1 - 1.0 K/uL   Eosinophils Relative 4 %   Eosinophils Absolute 0.3 0.0 - 0.7 K/uL   Basophils Relative 0 %   Basophils Absolute 0.0 0.0 - 0.1 K/uL    Comment: Performed at [facility], [place] [address] [place], [place] [address]  Brain natriuretic peptide     Status: Abnormal   Collection Time: 08/25/17 10:20 AM  Result Value Ref Range   B Natriuretic Peptide 202.3 (H) 0.0 - 100.0 pg/mL    Comment: Performed at [facility], [place] [address], [place],  [address]  Troponin I     Status: Abnormal   Collection Time: 08/25/17 10:20 AM  Result Value Ref Range   Troponin I  0.03 (HH) <0.03 ng/mL    Comment: CRITICAL RESULT CALLED TO, READ BACK BY AND VERIFIED WITH: [facility] RN AT 1203 08/25/17 MULLINS,T Performed at [facility], [place] [address] [place], [place] [address]   POC occult blood, ED     Status: None   Collection Time: 08/25/17 11:55 AM  Result Value Ref Range   Fecal Occult Bld NEGATIVE NEGATIVE  Prepare RBC     Status: None   Collection Time: 08/25/17 12:00 PM  Result Value Ref Range   Order Confirmation      ORDER PROCESSED BY BLOOD BANK Performed at [facility] [address], [place], [place] [address]   Urinalysis, Routine w reflex microscopic     Status: Abnormal   Collection Time: 08/25/17  2:04 PM  Result Value Ref Range   Color, Urine YELLOW YELLOW   APPearance CLEAR CLEAR   Specific Gravity, Urine 1.008 1.005 - 1.030   pH 5.0 5.0 - 8.0   Glucose, UA NEGATIVE NEGATIVE mg/dL   Hgb urine dipstick NEGATIVE NEGATIVE   Bilirubin Urine NEGATIVE NEGATIVE   Ketones, ur NEGATIVE NEGATIVE mg/dL   Protein, ur NEGATIVE NEGATIVE mg/dL   Nitrite POSITIVE (A) NEGATIVE   Leukocytes, UA TRACE (A) NEGATIVE   RBC / HPF 0-5 0 - 5 RBC/hpf   WBC, UA 0-5 0 - 5 WBC/hpf   Bacteria, UA RARE (A) NONE SEEN   Squamous Epithelial / LPF 0-5 (A) NONE SEEN   Mucus PRESENT     Comment: Performed at [facility], [place] [address] [place], [place] [address]   Dg Chest 2 View  Result Date: 08/25/2017 CLINICAL DATA:  Shortness of breath, low hemoglobin. History of asthma-COPD, current smoker. EXAM: CHEST  2 VIEW COMPARISON:  None in PACs FINDINGS: The lungs are adequately inflated. There is no focal infiltrate. The cardiac silhouette is enlarged. The pulmonary vascularity is engorged. There is mild diffuse interstitial prominence. There is calcification in the wall of the aortic arch. There is mild multilevel degenerative disc disease of the thoracic spine. Nerve stimulator electrodes are present at  approximately the T9-T10 level of the thoracic spine. The patient has undergone previous lower anterior cervical fusion. IMPRESSION: CHF with mild interstitial edema.  No acute pneumonia. Thoracic aortic atherosclerosis. Electronically Signed   By: David  Martinique M.D.   On: 08/25/2017 11:16    Pending Labs Unresulted Labs (From admission, onward)   Start     Ordered   08/26/17 0350  Basic metabolic panel  Tomorrow morning,   R     08/25/17 1514   08/26/17 0500  CBC  Tomorrow morning,   R     08/25/17 1514   08/25/17 1515  HIV antibody (Routine Testing)  Once,   R     08/25/17 1514   08/25/17 1515  Reticulocytes  STAT,   AD     08/25/17 1514   08/25/17 1515  Vitamin B12  STAT,   AD     08/25/17 1514   08/25/17 1515  Folate RBC  STAT,   AD     08/25/17 1514   08/25/17 1515  Iron and TIBC  STAT,   AD     08/25/17 1514   08/25/17 1515  Ferritin  STAT,   AD     08/25/17 1514   08/25/17 1515  Save smear  STAT,   AD     08/25/17 1514  08/25/17 1515  Lactate dehydrogenase  STAT,   AD     08/25/17 1514   08/25/17 1515  Direct antiglobulin test (not at [facility])  STAT,   AD     08/25/17 1514   08/25/17 1515  Haptoglobin  STAT,   AD     08/25/17 1514      Vitals/Pain Today's Vitals   08/25/17 1330 08/25/17 1407 08/25/17 1435 08/25/17 1453  BP: (!) 152/73 127/67 120/76 (!) 146/72  Pulse: 69 69 69 70  Resp: '12 12 14 12  ' Temp:   98.2 F (36.8 C) 98 F (36.7 C)  TempSrc:   Oral Oral  SpO2: 100% 100% 96% 100%  Weight:      Height:      PainSc:        Isolation Precautions No active isolations  Medications Medications  albuterol (PROVENTIL HFA;VENTOLIN HFA) 108 (90 Base) MCG/ACT inhaler 2 puff (not administered)  alendronate (FOSAMAX) tablet 70 mg (not administered)  aspirin EC tablet 81 mg (not administered)  atorvastatin (LIPITOR) tablet 40 mg (not administered)  loratadine (CLARITIN) tablet 10 mg (not administered)  Vitamin D-3 TABS 5,000 Units (not administered)   cyclobenzaprine (FLEXERIL) tablet 10 mg (not administered)  diphenhydramine-acetaminophen (TYLENOL PM) 25-500 MG per tablet 1 tablet (not administered)  DULoxetine (CYMBALTA) DR capsule 30 mg (not administered)  Iron TABS 325 mg (not administered)  fluticasone (FLONASE) 50 MCG/ACT nasal spray 2 spray (not administered)  fluticasone furoate-vilanterol (BREO ELLIPTA) 200-25 MCG/INH 1 puff (not administered)  mometasone-formoterol (DULERA) 200-5 MCG/ACT inhaler 2 puff (not administered)  gabapentin (NEURONTIN) tablet 600 mg (not administered)  levETIRAcetam (KEPPRA) tablet 1,000 mg (not administered)  levETIRAcetam (KEPPRA) tablet 750 mg (not administered)  levothyroxine (SYNTHROID, LEVOTHROID) tablet 50 mcg (not administered)  losartan (COZAAR) tablet 50 mg (not administered)  metoprolol tartrate (LOPRESSOR) tablet 25 mg (not administered)  Morphine-Naltrexone 20-0.8 MG CPCR 1 capsule (not administered)  multivitamin tablet 1 tablet (not administered)  omega-3 acid ethyl esters (LOVAZA) capsule 1 g (not administered)  enoxaparin (LOVENOX) injection 40 mg (not administered)  acetaminophen (TYLENOL) tablet 650 mg (not administered)    Or  acetaminophen (TYLENOL) suppository 650 mg (not administered)  polyethylene glycol (MIRALAX / GLYCOLAX) packet 17 g (not administered)  oxyCODONE (Oxy IR/ROXICODONE) immediate release tablet 5 mg (not administered)  ondansetron (ZOFRAN) tablet 4 mg (not administered)    Or  ondansetron (ZOFRAN) injection 4 mg (not administered)  furosemide (LASIX) injection 40 mg (not administered)  ipratropium-albuterol (DUONEB) 0.5-2.5 (3) MG/3ML nebulizer solution 3 mL (3 mLs Nebulization Given 08/25/17 1157)  0.9 %  sodium chloride infusion ( Intravenous New Bag/Given 08/25/17 1438)  furosemide (LASIX) injection 40 mg (40 mg Intravenous Given 08/25/17 1158)    Mobility walks with device (wheelchair, walker, and cane)

## 2017-08-25 NOTE — ED Notes (Signed)
Unsuccessful IV attempt to right forearm. Pt has an IV established to left forearm.

## 2017-08-25 NOTE — ED Notes (Signed)
Pure wick in place 

## 2017-08-25 NOTE — H&P (Signed)
History and Physical    Asaiah Hunnicutt KZS:[id number] DOB: [birth year] DOA: 08/25/2017  PCP: Dettinger, Fransisca Kaufmann, MD  Patient coming from: Home    Chief Complaint: Anemia  HPI: Tasha Clarke is a 67 y.o. female with medical history significant of multiple back surgeries complicated by chronic low back pain status post spinal cord stimulator, anemia of unclear etiology, hypothyroidism, coronary artery disease status post 2 stents, seizure disorder, COPD on 2 L chronically, active tobacco abuse, hypertension, hyperlipidemia who was called by her PCP and told to present to the ED for anemia.  Patient reports that she has been progressively more fatigued for the past several weeks.  She denies any blood in her stool.  She denies any hematemesis.  She denies any lower extremity edema that is new or worsening.  She denies any orthopnea that is new or worsening.  She denies any chest pain, abdominal pain, diarrhea, nausea vomiting.  She does report that she is had a worsening cough for the past several weeks.  She has not had any worsening wheezing.   She reports a long history of anemia ever since she was a child.  She has intermittently required transfusions throughout her life.  Her last transfusion was approximately 2 years ago.  She reports that this is "never been worked up".  She last had a colonoscopy about 6-7 years ago which per her family was unremarkable.  She denies any family history of anemia problems.  She has had a total abdominal hysterectomy and bilateral salpingo-oophorectomy several years ago.  However this was not for dysfunctional uterine bleeding.  Exline family had recently moved here from elsewhere [place] and had to establish care.  PCP check labs yesterday and noted that she was anemic and told her to present to the ED.   ED Course: In the ED vitals were fairly unremarkable.  Labs are notable for hemoglobin of 6.7.  Troponin was 0.03.  Patient was noted on chest x-ray to  have mild heart failure.  1 unit of blood was ordered and IV furosemide was ordered to be given along with it.  Patient was admitted for observation.  Review of Systems: As per HPI otherwise 10 point review of systems negative.     Past Medical History:  Diagnosis Date  . Allergy   . Anemia   . Anxiety   . Arthritis   . Asthma   . Blood transfusion without reported diagnosis   . Cancer ([place])   . Cataract   . CHF (congestive heart failure) ([place])   . Chronic kidney disease   . Clotting disorder ([place])   . COPD (chronic obstructive pulmonary disease) ([place])   . Depression   . Diabetes mellitus without complication ([place])   . Emphysema of lung ([place])   . GERD (gastroesophageal reflux disease)   . Hyperlipidemia   . Hypertension   . Neuromuscular disorder ([place])   . Osteoporosis   . Oxygen deficiency   . Seizures ([place])   . Sleep apnea 1985   wears cpap nightly  . Thyroid disease     Past Surgical History:  Procedure Laterality Date  . APPENDECTOMY    . EYE SURGERY    . FRACTURE SURGERY    . heart stents    . JOINT REPLACEMENT    . SPINAL CORD STIMULATOR IMPLANT  2015  . SPINE SURGERY       reports that she has been smoking cigarettes.  She has been smoking about 0.75 packs per  day. she has never used smokeless tobacco. She reports that she does not drink alcohol or use drugs.  Allergies  Allergen Reactions  . Cephalexin Hives  . Ketorolac Tromethamine Hives  . Lac Bovis Nausea And Vomiting  . Imdur  [Isosorbide Dinitrate]   . Iodinated Diagnostic Agents   . Other Other (See Comments)    Alka seltzer causes blurred vision, fainting  . Sodium Bicarbonate-Citric Acid   . Milk-Related Compounds Nausea And Vomiting    Family History  Problem Relation Age of Onset  . Arthritis Mother   . Asthma Mother   . Cancer Mother        lung  . Depression Mother   . Diabetes Mother   . Arthritis Father   . Asthma Father   . Depression Father   . Asthma Sister   .  Depression Sister   . Diabetes Sister   . Arthritis Maternal Grandmother   . Asthma Maternal Grandmother   . Depression Maternal Grandmother   . Arthritis Maternal Grandfather   . Asthma Maternal Grandfather   . Arthritis Paternal Grandmother   . Asthma Paternal Grandmother   . Arthritis Paternal Grandfather   . Asthma Paternal Grandfather   . COPD Paternal Grandfather    Unacceptable: Noncontributory, unremarkable, or negative. Acceptable: Family history reviewed and not pertinent (If you reviewed it)  Prior to Admission medications   Medication Sig Start Date End Date Taking? Authorizing Provider  albuterol (PROVENTIL HFA;VENTOLIN HFA) 108 (90 Base) MCG/ACT inhaler Inhale 2 puffs into the lungs every 6 (six) hours as needed for wheezing or shortness of breath.   Yes [provider]  alendronate (FOSAMAX) 70 MG tablet Take 70 mg by mouth every Monday. Take with a full glass of water on an empty stomach.    Yes [provider]  aspirin EC 81 MG tablet Take 81 mg by mouth daily.   Yes [provider]  atorvastatin (LIPITOR) 40 MG tablet Take 40 mg by mouth daily.   Yes [provider]  cetirizine (ZYRTEC) 10 MG tablet Take 10 mg by mouth daily.   Yes [provider]  Cholecalciferol (VITAMIN D-3) 5000 units TABS Take 5,000 Units by mouth daily.   Yes [provider]  cyclobenzaprine (FLEXERIL) 10 MG tablet Take 10 mg by mouth 2 (two) times daily as needed for muscle spasms.   Yes [provider]  diphenhydramine-acetaminophen (TYLENOL PM EXTRA STRENGTH) 25-500 MG TABS tablet Take 1 tablet by mouth at bedtime as needed (pain).    Yes [provider]  DULoxetine (CYMBALTA) 30 MG capsule Take 30 mg by mouth daily.   Yes [provider]  EPINEPHrine 0.3 mg/0.3 mL IJ SOAJ injection Inject 0.3 mg into the muscle as needed.   Yes [provider]  Ferrous Sulfate (IRON) 325 (65 Fe) MG TABS Take 68 mg by mouth  daily.   Yes [provider]  fluticasone (FLONASE) 50 MCG/ACT nasal spray Place 2 sprays into both nostrils daily.   Yes [provider]  fluticasone furoate-vilanterol (BREO ELLIPTA) 200-25 MCG/INH AEPB Inhale 1 puff into the lungs daily.   Yes [provider]  Fluticasone-Salmeterol (ADVAIR) 250-50 MCG/DOSE AEPB Inhale 1 puff into the lungs 2 (two) times daily.   Yes [provider]  gabapentin (NEURONTIN) 600 MG tablet Take 600 mg by mouth 4 (four) times daily.   Yes [provider]  ipratropium-albuterol (DUONEB) 0.5-2.5 (3) MG/3ML SOLN Take 3 mLs by nebulization every 6 (six)  hours as needed (shortness of breathe).    Yes [provider]  levETIRAcetam (KEPPRA) 1000 MG tablet Take 1,000 mg by mouth at bedtime.   Yes [provider]  levETIRAcetam (KEPPRA) 750 MG tablet Take 750 mg by mouth every morning.   Yes [provider]  levothyroxine (SYNTHROID, LEVOTHROID) 50 MCG tablet Take 50 mcg by mouth daily before breakfast.   Yes [provider]  losartan (COZAAR) 50 MG tablet Take 50 mg by mouth daily.   Yes [provider]  Magnesium 400 MG CAPS Take 400 mg by mouth daily.   Yes [provider]  metoprolol tartrate (LOPRESSOR) 25 MG tablet Take 25 mg by mouth 2 (two) times daily.   Yes [provider]  Morphine-Naltrexone 20-0.8 MG CPCR Take 1 capsule by mouth 2 (two) times daily.   Yes [provider]  Multiple Vitamin (MULTIVITAMIN) tablet Take 1 tablet by mouth daily.   Yes [provider]  naloxone (NARCAN) nasal spray 4 mg/0.1 mL Place 1 spray into the nose daily as needed (overdose.).    Yes [provider]  Omega-3 Fatty Acids (FISH OIL) 1000 MG CAPS Take 1,000 mg by mouth daily.   Yes [provider]    Physical Exam: Vitals:   08/25/17 1153 08/25/17 1205 08/25/17 1230 08/25/17 1242  BP: (!) 134/53 (!) 134/106 (!) 168/83 (!) 168/83  Pulse:  70 73 66 67  Resp: 14 17 11 11   Temp:      TempSrc:      SpO2: 98% 100% 100% 100%  Weight:      Height:        Constitutional: NAD, calm, comfortable Vitals:   08/25/17 1153 08/25/17 1205 08/25/17 1230 08/25/17 1242  BP: (!) 134/53 (!) 134/106 (!) 168/83 (!) 168/83  Pulse: 70 73 66 67  Resp: 14 17 11 11   Temp:      TempSrc:      SpO2: 98% 100% 100% 100%  Weight:      Height:       Eyes: Anicteric sclera, chronically ill-appearing, appears older than stated age ENMT: Moist mucous membranes, nasal cannula in place, very pale appearing mucous membranes.  Neck: normal, supple Respiratory: Audible wheezing, diffuse expiratory wheezes throughout, crackles at bases Cardiovascular: 2 out of 6 systolic murmur heard asked over right upper sternal border, mildly hyperdynamic, no rubs Abdomen: no tenderness, no masses palpated. No hepatosplenomegaly. Bowel sounds positive.  Musculoskeletal: Trace lower extremity edema.  Skin: Chronic venous stasis changes Neurologic: Intact, moving all extremities Psychiatric: Normal judgment and insight. Alert and oriented x 3. Normal mood.    Labs on Admission: I have personally reviewed following labs and imaging studies  CBC: Recent Labs  Lab 08/24/17 1102 08/25/17 1020  WBC 7.1 8.2  NEUTROABS 4.5 5.4  HGB 6.6* 6.7*  HCT 24.1* 24.8*  MCV 74* 76.5*  PLT 259 673   Basic Metabolic Panel: Recent Labs  Lab 08/24/17 1102 08/25/17 1020  NA 139 138  K 5.2 4.2  CL 106 105  CO2 20 24  GLUCOSE 80 108*  BUN 20 24*  CREATININE 0.82 0.95  CALCIUM 9.2 9.3   GFR: Estimated Creatinine Clearance: 69.9 mL/min (by C-G formula based on SCr of 0.95 mg/dL). Liver Function Tests: Recent Labs  Lab 08/24/17 1102 08/25/17 1020  AST 19 22  ALT 14 15  ALKPHOS 92 92  BILITOT <0.2 0.4  PROT 6.2 6.8  ALBUMIN 3.5* 3.5   No results for input(s):  LIPASE, AMYLASE in the last 168 hours. No results for input(s): AMMONIA in the last 168  hours. Coagulation Profile: No results for input(s): INR, PROTIME in the last 168 hours. Cardiac Enzymes: Recent Labs  Lab 08/25/17 1020  TROPONINI 0.03*   BNP (last 3 results) No results for input(s): PROBNP in the last 8760 hours. HbA1C: No results for input(s): HGBA1C in the last 72 hours. CBG: No results for input(s): GLUCAP in the last 168 hours. Lipid Profile: Recent Labs    08/24/17 1102  CHOL 134  HDL 26*  LDLCALC 75  TRIG 164*  CHOLHDL 5.2*   Thyroid Function Tests: Recent Labs    08/24/17 1102  TSH 3.500   Anemia Panel: No results for input(s): VITAMINB12, FOLATE, FERRITIN, TIBC, IRON, RETICCTPCT in the last 72 hours. Urine analysis: No results found for: COLORURINE, APPEARANCEUR, LABSPEC, PHURINE, GLUCOSEU, HGBUR, BILIRUBINUR, KETONESUR, PROTEINUR, UROBILINOGEN, NITRITE, LEUKOCYTESUR  Radiological Exams on Admission: Dg Chest 2 View  Result Date: 08/25/2017 CLINICAL DATA:  Shortness of breath, low hemoglobin. History of asthma-COPD, current smoker. EXAM: CHEST  2 VIEW COMPARISON:  None in PACs FINDINGS: The lungs are adequately inflated. There is no focal infiltrate. The cardiac silhouette is enlarged. The pulmonary vascularity is engorged. There is mild diffuse interstitial prominence. There is calcification in the wall of the aortic arch. There is mild multilevel degenerative disc disease of the thoracic spine. Nerve stimulator electrodes are present at approximately the T9-T10 level of the thoracic spine. The patient has undergone previous lower anterior cervical fusion. IMPRESSION: CHF with mild interstitial edema.  No acute pneumonia. Thoracic aortic atherosclerosis. Electronically Signed   By: David  Martinique M.D.   On: 08/25/2017 11:16    EKG: Independently reviewed.  Sinus, no acute ST segment changes  Assessment/Plan Principal Problem:   Anemia Active Problems:   COPD (chronic obstructive pulmonary disease) ([facility])   Seizure disorder ([facility])    Depression, recurrent ([facility])   Osteoporosis   Chronic pain syndrome   Hypothyroid   Hypertension    ##) Anemia: Unclear etiology.  She does not appear to have chronic GI losses as she had a colonoscopy approximately 6-7 years ago.  It is unlikely that she is having heavy periods that she has had a surgery to remove her uterus.  It is not clear that she is chronically hemolyzing.  She does have a murmur and could have macrovascular hemolysis however she does not have any evidence on her CMP of an elevated unconjugated bilirubin.  Her anemia is mildly microcytic however the RDW is extremely elevated.   -iron studies, B12, folate, reticulocyte count -LDH, haptoglobin, Coombs test, smear -We will transfuse 1 unit and give IV furosemide -Continue iron supplement  ##) Mild heart failure exacerbation: No echo on system to see if this is systolic or diastolic.  Would suspect likely diastolic is above patient does have coronary artery disease she has never had an MI.  Likely in the setting of-up with heart failure in the setting of symptomatic anemia. -furosemide 40 mg IV twice daily -Sodium and fluid restricted diet -Weigh daily, strict ins and outs  ##) COPD on 2 L: - PRN duo nebs -Continue inhaled corticosteroid, long-acting beta agonist  ##) Hypothyroidism: - Continue levothyroxine 50 mg  ##) Seizure disorder: -Continue levetiracetam  ##) Coronary artery disease status post 2 stents: -Atorvastatin 40 mg daily -Continue aspirin 81 mg -Continue metoprolol tartrate 25 mg twice a day -Continue losartan 50 mg daily  ##) Chronic pain: -Continue as needed  cyclobenzaprine - Continue duloxetine 30 mill grams daily - Continue gabapentin 600 mg 4 times daily -Continue morphine naltrexone combination pill twice a day excellent ##) Osteoporosis: -Continue alendronate 70 mg weekly  ##) Allergic rhinitis: -Continue cetirizine 10 mg daily -Continue inhaled fluticasone  ##) Hypothyroidism -  Continue levothyroxine 50 mcg daily  ##) Hypertension: - arb, beta-blocker  Cristy Folks MD [facility]  If 7PM-7AM, please contact night-coverage [email or web address] Password [email or web address]  08/25/2017, 1:05 PM

## 2017-08-25 NOTE — ED Provider Notes (Signed)
[facility] DEPT Provider Note  CSN: [id number] Arrival date & time: 08/25/17 1002  Chief Complaint(s) low HGb  HPI Tasha Clarke is a 67 y.o. female with an extensive past medical history listed below including anemia without known source requiring blood transfusions, COPD on 2 L nasal cannula at home, CHF.  She presents after screening labs while establishing care with a primary care provider yesterday that revealed a hemoglobin of 6.6.  Patient does report that she is been having worsening fatigue and dyspnea on exertion for the past several months.  Improves with rest.  she endorses associated lower extremity edema which has been improving.  Denies any chest pain, nausea, vomiting, abdominal pain, diarrhea.  Denies any other physical complaints.  The history is provided by the patient and the spouse.     Past Medical History Past Medical History:  Diagnosis Date  . Allergy   . Anemia   . Anxiety   . Arthritis   . Asthma   . Blood transfusion without reported diagnosis   . Cancer ([place])   . Cataract   . CHF (congestive heart failure) ([place])   . Chronic kidney disease   . Clotting disorder ([place])   . COPD (chronic obstructive pulmonary disease) ([place])   . Depression   . Diabetes mellitus without complication ([place])   . Emphysema of lung ([place])   . GERD (gastroesophageal reflux disease)   . Hyperlipidemia   . Hypertension   . Neuromuscular disorder ([place])   . Osteoporosis   . Oxygen deficiency   . Seizures ([place])   . Sleep apnea 1985   wears cpap nightly  . Thyroid disease    Patient Active Problem List   Diagnosis Date Noted  . COPD (chronic obstructive pulmonary disease) ([place]) 08/24/2017  . Hyperlipidemia LDL goal <100 08/24/2017  . Seizure disorder ([place]) 08/24/2017  . Depression, recurrent ([place]) 08/24/2017  . Osteoporosis 08/24/2017  . Chronic pain syndrome 08/24/2017  . Hypothyroid 08/24/2017  . Hypertension 08/24/2017   Home  Medication(s) Prior to Admission medications   Medication Sig Start Date End Date Taking? Authorizing Provider  albuterol (PROVENTIL HFA;VENTOLIN HFA) 108 (90 Base) MCG/ACT inhaler Inhale 2 puffs into the lungs every 6 (six) hours as needed for wheezing or shortness of breath.   Yes [provider]  alendronate (FOSAMAX) 70 MG tablet Take 70 mg by mouth every Monday. Take with a full glass of water on an empty stomach.    Yes [provider]  aspirin EC 81 MG tablet Take 81 mg by mouth daily.   Yes [provider]  atorvastatin (LIPITOR) 40 MG tablet Take 40 mg by mouth daily.   Yes [provider]  cetirizine (ZYRTEC) 10 MG tablet Take 10 mg by mouth daily.   Yes [provider]  Cholecalciferol (VITAMIN D-3) 5000 units TABS Take 5,000 Units by mouth daily.   Yes [provider]  cyclobenzaprine (FLEXERIL) 10 MG tablet Take 10 mg by mouth 2 (two) times daily as needed for muscle spasms.   Yes [provider]  diphenhydramine-acetaminophen (TYLENOL PM EXTRA STRENGTH) 25-500 MG TABS tablet Take 1 tablet by mouth at bedtime as needed (pain).    Yes [provider]  DULoxetine (CYMBALTA) 30 MG capsule Take 30 mg by mouth daily.   Yes [provider]  EPINEPHrine 0.3 mg/0.3 mL IJ SOAJ injection Inject 0.3 mg into the muscle as needed.   Yes [provider]  Ferrous Sulfate (IRON) 325 (65  Fe) MG TABS Take 68 mg by mouth daily.   Yes [provider]  fluticasone (FLONASE) 50 MCG/ACT nasal spray Place 2 sprays into both nostrils daily.   Yes [provider]  fluticasone furoate-vilanterol (BREO ELLIPTA) 200-25 MCG/INH AEPB Inhale 1 puff into the lungs daily.   Yes [provider]  Fluticasone-Salmeterol (ADVAIR) 250-50 MCG/DOSE AEPB Inhale 1 puff into the lungs 2 (two) times daily.   Yes [provider]  gabapentin (NEURONTIN) 600 MG tablet Take 600 mg by mouth 4 (four) times daily.    Yes [provider]  ipratropium-albuterol (DUONEB) 0.5-2.5 (3) MG/3ML SOLN Take 3 mLs by nebulization every 6 (six) hours as needed (shortness of breathe).    Yes [provider]  levETIRAcetam (KEPPRA) 1000 MG tablet Take 1,000 mg by mouth at bedtime.   Yes [provider]  levETIRAcetam (KEPPRA) 750 MG tablet Take 750 mg by mouth every morning.   Yes [provider]  levothyroxine (SYNTHROID, LEVOTHROID) 50 MCG tablet Take 50 mcg by mouth daily before breakfast.   Yes [provider]  losartan (COZAAR) 50 MG tablet Take 50 mg by mouth daily.   Yes [provider]  Magnesium 400 MG CAPS Take 400 mg by mouth daily.   Yes [provider]  metoprolol tartrate (LOPRESSOR) 25 MG tablet Take 25 mg by mouth 2 (two) times daily.   Yes [provider]  Morphine-Naltrexone 20-0.8 MG CPCR Take 1 capsule by mouth 2 (two) times daily.   Yes [provider]  Multiple Vitamin (MULTIVITAMIN) tablet Take 1 tablet by mouth daily.   Yes [provider]  naloxone (NARCAN) nasal spray 4 mg/0.1 mL Place 1 spray into the nose daily as needed (overdose.).    Yes [provider]  Omega-3 Fatty Acids (FISH OIL) 1000 MG CAPS Take 1,000 mg by mouth daily.   Yes [provider]                                                                                                                                    Past Surgical History Past Surgical History:  Procedure Laterality Date  . APPENDECTOMY    . EYE SURGERY    . FRACTURE SURGERY    . heart stents    . JOINT REPLACEMENT    . SPINAL CORD STIMULATOR IMPLANT  2015  . SPINE SURGERY     Family History Family History  Problem Relation Age of Onset  . Arthritis Mother   . Asthma Mother   . Cancer Mother        lung  . Depression Mother   . Diabetes Mother   . Arthritis Father   . Asthma Father   . Depression Father   . Asthma Sister   . Depression Sister    . Diabetes Sister   . Arthritis Maternal Grandmother   . Asthma Maternal Grandmother   .  Depression Maternal Grandmother   . Arthritis Maternal Grandfather   . Asthma Maternal Grandfather   . Arthritis Paternal Grandmother   . Asthma Paternal Grandmother   . Arthritis Paternal Grandfather   . Asthma Paternal Grandfather   . COPD Paternal Grandfather     Social History Social History   Tobacco Use  . Smoking status: Current Every Day Smoker    Packs/day: 0.75    Types: Cigarettes  . Smokeless tobacco: Never Used  Substance Use Topics  . Alcohol use: No    Frequency: Never  . Drug use: No   Allergies Cephalexin; Ketorolac tromethamine; [place]; Imdur  [isosorbide dinitrate]; Iodinated diagnostic agents; Other; Sodium bicarbonate-citric acid; and Milk-related compounds  Review of Systems Review of Systems All other systems are reviewed and are negative for acute change except as noted in the HPI  Physical Exam Vital Signs  I have reviewed the triage vital signs BP 104/68   Pulse 64   Temp 98.6 F (37 C) (Oral)   Resp 16   Ht 5\' 5"  (1.651 m)   Wt 102.1 kg (225 lb)   SpO2 90%   BMI 37.44 kg/m   Physical Exam  Constitutional: She is oriented to person, place, and time. She appears well-developed and well-nourished. No distress.  HENT:  Head: Normocephalic and atraumatic.  Nose: Nose normal.  Eyes: Conjunctivae and EOM are normal. Pupils are equal, round, and reactive to light. Right eye exhibits no discharge. Left eye exhibits no discharge. No scleral icterus.  Pale conjunctiva  Neck: Normal range of motion. Neck supple.  Cardiovascular: Normal rate and regular rhythm. Exam reveals no gallop and no friction rub.  No murmur heard. Pulmonary/Chest: Effort normal. No stridor. No respiratory distress. She has wheezes in the right upper field, the right middle field, the right lower field, the left upper field, the left middle field and the left lower field. She has  no rales.  Abdominal: Soft. She exhibits no distension. There is no tenderness.  Musculoskeletal: She exhibits no edema or tenderness.  1+ BLE edema  Neurological: She is alert and oriented to person, place, and time.  Skin: Skin is warm and dry. No rash noted. She is not diaphoretic. No erythema.  Psychiatric: She has a normal mood and affect.  Vitals reviewed.   ED Results and Treatments Labs (all labs ordered are listed, but only abnormal results are displayed) Labs Reviewed  COMPREHENSIVE METABOLIC PANEL - Abnormal; Notable for the following components:      Result Value   Glucose, Bld 108 (*)    BUN 24 (*)    All other components within normal limits  CBC WITH DIFFERENTIAL/PLATELET - Abnormal; Notable for the following components:   RBC 3.24 (*)    Hemoglobin 6.7 (*)    HCT 24.8 (*)    MCV 76.5 (*)    MCH 20.7 (*)    MCHC 27.0 (*)    RDW 20.5 (*)    All other components within normal limits  BRAIN NATRIURETIC PEPTIDE - Abnormal; Notable for the following components:   B Natriuretic Peptide 202.3 (*)    All other components within normal limits  TROPONIN I - Abnormal; Notable for the following components:   Troponin I 0.03 (*)    All other components within normal limits  URINALYSIS, ROUTINE W REFLEX MICROSCOPIC  POC OCCULT BLOOD, ED  TYPE AND SCREEN  ABO/RH  PREPARE RBC (CROSSMATCH)  EKG  EKG Interpretation  Date/Time:    Ventricular Rate:    PR Interval:    QRS Duration:   QT Interval:    QTC Calculation:   R Axis:     Text Interpretation:        Radiology Dg Chest 2 View  Result Date: 08/25/2017 CLINICAL DATA:  Shortness of breath, low hemoglobin. History of asthma-COPD, current smoker. EXAM: CHEST  2 VIEW COMPARISON:  None in PACs FINDINGS: The lungs are adequately inflated. There is no focal infiltrate. The cardiac silhouette is  enlarged. The pulmonary vascularity is engorged. There is mild diffuse interstitial prominence. There is calcification in the wall of the aortic arch. There is mild multilevel degenerative disc disease of the thoracic spine. Nerve stimulator electrodes are present at approximately the T9-T10 level of the thoracic spine. The patient has undergone previous lower anterior cervical fusion. IMPRESSION: CHF with mild interstitial edema.  No acute pneumonia. Thoracic aortic atherosclerosis. Electronically Signed   By: David  Martinique M.D.   On: 08/25/2017 11:16   Pertinent labs & imaging results that were available during my care of the patient were reviewed by me and considered in my medical decision making (see chart for details).  Medications Ordered in ED Medications  0.9 %  sodium chloride infusion (not administered)  ipratropium-albuterol (DUONEB) 0.5-2.5 (3) MG/3ML nebulizer solution 3 mL (3 mLs Nebulization Given 08/25/17 1157)  furosemide (LASIX) injection 40 mg (40 mg Intravenous Given 08/25/17 1158)                                                                                                                                    Procedures Procedures CRITICAL CARE Performed by: Grayce Sessions Cardama Total critical care time: 45 minutes Critical care time was exclusive of separately billable procedures and treating other patients. Critical care was necessary to treat or prevent imminent or life-threatening deterioration. Critical care was time spent personally by me on the following activities: development of treatment plan with patient and/or surrogate as well as nursing, discussions with consultants, evaluation of patient's response to treatment, examination of patient, obtaining history from patient or surrogate, ordering and performing treatments and interventions, ordering and review of laboratory studies, ordering and review of radiographic studies, pulse oximetry and re-evaluation of patient's  condition.  (including critical care time)  Medical Decision Making / ED Course I have reviewed the nursing notes for this encounter and the patient's prior records (if available in EHR or on provided paperwork).    Hemoglobin confirmed at 6.7. Stool guaiac negative but unable to obtain a good stool sample.  Workup also remarkable for CHF exacerbation with pulmonary edema.  Mildly elevated troponin likely secondary to demand ischemia from heart failure.  EKG without acute ischemic changes or evidence of pericarditis.  Will transfuse initial packed red blood cells in the emergency department at a slow rate and provide Lasix.   Case was discussed with  hospitalist who will admit the patient for further workup and management.  Final Clinical Impression(s) / ED Diagnoses Final diagnoses:  SOB (shortness of breath)  Symptomatic anemia  Acute on chronic congestive heart failure, unspecified heart failure type [facility])      This chart was dictated using voice recognition software.  Despite best efforts to proofread,  errors can occur which can change the documentation meaning.   Fatima Blank, MD 08/25/17 1210

## 2017-08-25 NOTE — CV Procedure (Signed)
Attempted 2D Echo, nurses were with patient, will try again tomorrow.  Tasha Clarke

## 2017-08-25 NOTE — Progress Notes (Signed)
Report received from ongoing nurse. Agreed with nurse assessment of patient and will cont to monitor. 

## 2017-08-25 NOTE — ED Notes (Signed)
Date and time results received: 08/25/17 1105 Test: hemoglobin  Critical Value: 6.7  Name of Provider Notified: Cardama MD  Orders Received? Or Actions Taken?: awaiting orders

## 2017-08-25 NOTE — ED Notes (Signed)
Lab at bedside attempting lab draw.

## 2017-08-26 ENCOUNTER — Inpatient Hospital Stay (HOSPITAL_COMMUNITY): Payer: Medicare Other

## 2017-08-26 DIAGNOSIS — D508 Other iron deficiency anemias: Secondary | ICD-10-CM

## 2017-08-26 DIAGNOSIS — R0602 Shortness of breath: Secondary | ICD-10-CM | POA: Diagnosis not present

## 2017-08-26 DIAGNOSIS — I509 Heart failure, unspecified: Secondary | ICD-10-CM | POA: Diagnosis not present

## 2017-08-26 DIAGNOSIS — I361 Nonrheumatic tricuspid (valve) insufficiency: Secondary | ICD-10-CM | POA: Diagnosis not present

## 2017-08-26 DIAGNOSIS — D649 Anemia, unspecified: Secondary | ICD-10-CM | POA: Diagnosis not present

## 2017-08-26 LAB — IRON AND TIBC
Iron: 24 ug/dL — ABNORMAL LOW (ref 28–170)
Saturation Ratios: 5 % — ABNORMAL LOW (ref 10.4–31.8)
TIBC: 484 ug/dL — ABNORMAL HIGH (ref 250–450)
UIBC: 460 ug/dL

## 2017-08-26 LAB — BASIC METABOLIC PANEL WITH GFR
BUN: 21 mg/dL — ABNORMAL HIGH (ref 6–20)
Calcium: 8.8 mg/dL — ABNORMAL LOW (ref 8.9–10.3)
Creatinine, Ser: 0.89 mg/dL (ref 0.44–1.00)
GFR calc non Af Amer: >60 mL/min (ref >60)
Sodium: 138 mmol/L (ref 135–145)

## 2017-08-26 LAB — CBC
HCT: 24.8 % — ABNORMAL LOW (ref 36.0–46.0)
Hemoglobin: 7.2 g/dL — ABNORMAL LOW (ref 12.0–15.0)
MCH: 22.4 pg — ABNORMAL LOW (ref 26.0–34.0)
MCHC: 29 g/dL — ABNORMAL LOW (ref 30.0–36.0)
MCV: 77 fL — ABNORMAL LOW (ref 78.0–100.0)
Platelets: 249 K/uL (ref 150–400)
RBC: 3.22 MIL/uL — ABNORMAL LOW (ref 3.87–5.11)
RDW: 20.1 % — ABNORMAL HIGH (ref 11.5–15.5)
WBC: 8.2 K/uL (ref 4.0–10.5)

## 2017-08-26 LAB — BASIC METABOLIC PANEL
Anion gap: 9 (ref 5–15)
CO2: 22 mmol/L (ref 22–32)
Chloride: 107 mmol/L (ref 101–111)
GFR calc Af Amer: >60 mL/min (ref >60)
Glucose, Bld: 85 mg/dL (ref 65–99)
Potassium: 4.7 mmol/L (ref 3.5–5.1)

## 2017-08-26 LAB — ECHOCARDIOGRAM COMPLETE
Height: 65 in
Weight: 3679.04 oz

## 2017-08-26 LAB — VITAMIN B12: Vitamin B-12: 194 pg/mL (ref 180–914)

## 2017-08-26 LAB — FERRITIN: Ferritin: 6 ng/mL — ABNORMAL LOW (ref 11–307)

## 2017-08-26 LAB — HIV ANTIBODY (ROUTINE TESTING W REFLEX): HIV Screen 4th Generation wRfx: NONREACTIVE

## 2017-08-26 LAB — GLUCOSE, CAPILLARY: Glucose-Capillary: 98 mg/dL (ref 65–99)

## 2017-08-26 MED ORDER — FERROUS SULFATE 325 (65 FE) MG PO TABS
325.0000 mg | ORAL_TABLET | Freq: Three times a day (TID) | ORAL | Status: DC
  Administered 2017-08-26 – 2017-08-27 (×4): 325 mg via ORAL
  Filled 2017-08-26 (×4): qty 1

## 2017-08-26 NOTE — Progress Notes (Signed)
  Echocardiogram 2D Echocardiogram has been performed.  Ritter Helsley T Maryella Abood 08/26/2017, 9:35 AM

## 2017-08-26 NOTE — Care Management Note (Signed)
Case Management Note  Patient Details  Name: Tasha Clarke MRN: [id number] Date of Birth: 11-26-1950  Subjective/Objective:  67 y/o f admitted w/anemia. From home-recent move from [place] @ home-[place]-active w/home [employer] Deeds are not in her current service area-they will d/c patient, & arrange for pick up of their home 02-[facility] rep Santiago Glad will follow for DME-arranging home 02-with qualifying 02 sats,dx,& order. PT-recc HHPT-provided patient w/HHC agency list-await choice,& HHPT order.                  Action/Plan:d/c home w/HHC   Expected Discharge Date:  (unknown)               Expected Discharge Plan:  [place]  In-House Referral:     Discharge planning Services  CM Consult  Post Acute Care Choice:  Durable Medical Equipment(home 02-Healthy @ home,rw) Choice offered to:  Patient  DME Arranged:    DME Agency:     HH Arranged:    [place] Agency:     Status of Service:     If discussed at [employer], dates discussed:    Additional Comments:  Dessa Phi, RN 08/26/2017, 4:13 PM

## 2017-08-26 NOTE — Progress Notes (Signed)
PROGRESS NOTE    Tasha Clarke  WEX:[id number] DOB: 1951-06-23 DOA: 08/25/2017 PCP: Dettinger, Fransisca Kaufmann, MD    Brief Narrative:  67 y.o. female with medical history significant of multiple back surgeries complicated by chronic low back pain status post spinal cord stimulator, anemia of unclear etiology, hypothyroidism, coronary artery disease status post 2 stents, seizure disorder, COPD on 2 L chronically, active tobacco abuse, hypertension, hyperlipidemia who was called by her PCP and told to present to the ED for anemia.  Currently getting work up and patient is s/p transfusion   Assessment & Plan:   Principal Problem:   Anemia - no active bleeding noted on exam. LDH is within normal limits - MCV is below 80 and iron levels are low. As such I suspect iron deficiency anemia. - Patient reportedly had a normal colonoscopy roughly 6 years ago. I would recommend follow-up with GI after hospital discharge - Patient will require iron supplementation, will place order for ferrous sulfate 325 mg po tid  Active Problems:   COPD (chronic obstructive pulmonary disease) ([facility]) - stable currently    Seizure disorder ([place]) - stable on Keppra    Depression, recurrent ([facility]) - Pt on cymbalta    Osteoporosis   Chronic pain syndrome - stable on current medication regimen.    Hypothyroid - stable on synthroid    Hypertension - Pt on cozaar, metoprolol   DVT prophylaxis: Lovenox Code Status: Full Family Communication: d/c patient directly and spouse at bedside. Disposition Plan: pending improvement in condition   Consultants:   None   Procedures: Echocardiogram: EF of 60-65% no regional wall motion abnormalities and normal Left ventricular diastolic function parameters  Per report   Antimicrobials: none   Subjective: Pt has no new complaints  Objective: Vitals:   08/26/17 1000 08/26/17 1327 08/26/17 1330 08/26/17 1331  BP: (!) 102/48 (!) 83/41 (!) 98/48 (!) 108/50    Pulse: 66 84    Resp:  18    Temp:  99 F (37.2 C)    TempSrc:  Oral    SpO2:  97%    Weight:      Height:        Intake/Output Summary (Last 24 hours) at 08/26/2017 1541 Last data filed at 08/26/2017 1300 Gross per 24 hour  Intake 869.92 ml  Output 1100 ml  Net -230.08 ml   Filed Weights   08/25/17 1009 08/25/17 1557 08/26/17 0519  Weight: 102.1 kg (225 lb) 102.9 kg (226 lb 13.7 oz) 104.3 kg (229 lb 15 oz)    Examination:  General exam: Appears calm and comfortable, in nad. Respiratory system: Clear to auscultation. Respiratory effort normal. Equal chest rise Cardiovascular system: S1 & S2 heard, RRR. No JVD, murmurs, rubs, gallops or clicks. No pedal edema. Gastrointestinal system: Abdomen is nondistended, soft and nontender. No organomegaly or masses felt. Normal bowel sounds heard. Central nervous system: Alert and oriented. No focal neurological deficits. Extremities: Symmetric 5 x 5 power. Skin: No rashes, lesions or ulcers Psychiatry:  Mood & affect appropriate.     Data Reviewed: I have personally reviewed following labs and imaging studies  CBC: Recent Labs  Lab 08/24/17 1102 08/25/17 1020 08/26/17 0454  WBC 7.1 8.2 8.2  NEUTROABS 4.5 5.4  --   HGB 6.6* 6.7* 7.2*  HCT 24.1* 24.8* 24.8*  MCV 74* 76.5* 77.0*  PLT 259 301 938   Basic Metabolic Panel: Recent Labs  Lab 08/24/17 1102 08/25/17 1020 08/26/17 0454  NA 139 138 138  K 5.2 4.2 4.7  CL 106 105 107  CO2 20 24 22   GLUCOSE 80 108* 85  BUN 20 24* 21*  CREATININE 0.82 0.95 0.89  CALCIUM 9.2 9.3 8.8*   GFR: Estimated Creatinine Clearance: 75.5 mL/min (by C-G formula based on SCr of 0.89 mg/dL). Liver Function Tests: Recent Labs  Lab 08/24/17 1102 08/25/17 1020  AST 19 22  ALT 14 15  ALKPHOS 92 92  BILITOT <0.2 0.4  PROT 6.2 6.8  ALBUMIN 3.5* 3.5   No results for input(s): LIPASE, AMYLASE in the last 168 hours. No results for input(s): AMMONIA in the last 168 hours. Coagulation  Profile: No results for input(s): INR, PROTIME in the last 168 hours. Cardiac Enzymes: Recent Labs  Lab 08/25/17 1020  TROPONINI 0.03*   BNP (last 3 results) No results for input(s): PROBNP in the last 8760 hours. HbA1C: No results for input(s): HGBA1C in the last 72 hours. CBG: Recent Labs  Lab 08/26/17 0728  GLUCAP 98   Lipid Profile: Recent Labs    08/24/17 1102  CHOL 134  HDL 26*  LDLCALC 75  TRIG 164*  CHOLHDL 5.2*   Thyroid Function Tests: Recent Labs    08/24/17 1102  TSH 3.500   Anemia Panel: Recent Labs    08/25/17 2054  VITAMINB12 194  FERRITIN 6*  TIBC 484*  IRON 24*  RETICCTPCT 2.6   Sepsis Labs: No results for input(s): PROCALCITON, LATICACIDVEN in the last 168 hours.  No results found for this or any previous visit (from the past 240 hour(s)).       Radiology Studies: Dg Chest 2 View  Result Date: 08/25/2017 CLINICAL DATA:  Shortness of breath, low hemoglobin. History of asthma-COPD, current smoker. EXAM: CHEST  2 VIEW COMPARISON:  None in PACs FINDINGS: The lungs are adequately inflated. There is no focal infiltrate. The cardiac silhouette is enlarged. The pulmonary vascularity is engorged. There is mild diffuse interstitial prominence. There is calcification in the wall of the aortic arch. There is mild multilevel degenerative disc disease of the thoracic spine. Nerve stimulator electrodes are present at approximately the T9-T10 level of the thoracic spine. The patient has undergone previous lower anterior cervical fusion. IMPRESSION: CHF with mild interstitial edema.  No acute pneumonia. Thoracic aortic atherosclerosis. Electronically Signed   By: David  Martinique M.D.   On: 08/25/2017 11:16        Scheduled Meds: . aspirin EC  81 mg Oral Daily  . atorvastatin  40 mg Oral Daily  . cholecalciferol  5,000 Units Oral Daily  . DULoxetine  30 mg Oral Daily  . enoxaparin (LOVENOX) injection  50 mg Subcutaneous Q24H  . ferrous sulfate  325  mg Oral Q breakfast  . fluticasone  2 spray Each Nare Daily  . furosemide  40 mg Intravenous BID  . gabapentin  600 mg Oral QID  . levETIRAcetam  1,000 mg Oral QHS  . levETIRAcetam  750 mg Oral Daily  . levothyroxine  50 mcg Oral QAC breakfast  . loratadine  10 mg Oral Daily  . losartan  50 mg Oral Daily  . metoprolol tartrate  25 mg Oral BID  . mometasone-formoterol  2 puff Inhalation BID  . Morphine-Naltrexone  1 capsule Oral BID  . multivitamin with minerals  1 tablet Oral Daily  . omega-3 acid ethyl esters  1 g Oral BID   Continuous Infusions:   LOS: 1 day    Time spent: > 35 minutes    Ivana Nicastro  Wendee Beavers, MD [facility] Pager [phone]  If 7PM-7AM, please contact night-coverage [email or web address] Password [facility] 08/26/2017, 3:41 PM

## 2017-08-26 NOTE — Progress Notes (Signed)
  Echocardiogram 2D Echocardiogram has been performed.  Saabir Blyth T Xena Propst 08/26/2017, 9:34 AM

## 2017-08-26 NOTE — Evaluation (Signed)
Physical Therapy Evaluation Patient Details Name: Tasha Clarke MRN: [id number] DOB: [birth year] Today's Date: 08/26/2017   History of Present Illness  67 y.o. female with medical history significant of multiple back surgeries complicated by chronic low back pain status post spinal cord stimulator, anemia of unclear etiology, hypothyroidism, coronary artery disease status post 2 stents, seizure disorder, COPD on 2 L chronically, active tobacco abuse, hypertension, hyperlipidemia who was called by her PCP and told to present to the ED for anemia.    Clinical Impression  Pt admitted with above diagnosis. Pt currently with functional limitations due to the deficits listed below (see PT Problem List). Supervision for transfer from bed to recliner. Pt reported 7/10 low back pain and mild dizziness in standing. BP supine 122/51, sitting 133/64, standing 108/45. I expect pt will progress and be able to return to her baseline of ambulating short, household distances independently. Pt will benefit from skilled PT to increase their independence and safety with mobility to allow discharge to the venue listed below.       Follow Up Recommendations Home health PT    Equipment Recommendations  None recommended by PT    Recommendations for Other Services       Precautions / Restrictions Precautions Precautions: Fall Precaution Comments: 10-15 falls in past 1 year Restrictions Weight Bearing Restrictions: No      Mobility  Bed Mobility Overal bed mobility: Needs Assistance Bed Mobility: Supine to Sit     Supine to sit: Modified independent (Device/Increase time);HOB elevated     General bed mobility comments: used rail, HOB up  Transfers Overall transfer level: Needs assistance Equipment used: Rolling walker (2 wheeled) Transfers: Sit to/from [facility] Sit to Stand: Supervision Stand pivot transfers: Supervision       General transfer comment: steady, no LOB, pt  reported mild dizziness (see orthostatic BPs in flowsheets)  Ambulation/Gait             General Gait Details: deferred 2* pain/fatigue/dizziness  Stairs            Wheelchair Mobility    Modified Rankin (Stroke Patients Only)       Balance Overall balance assessment: History of Falls;Needs assistance   Sitting balance-Leahy Scale: Good     Standing balance support: Bilateral upper extremity supported Standing balance-Leahy Scale: Poor Standing balance comment: relies on BUE support                             Pertinent Vitals/Pain Pain Assessment: 0-10 Pain Score: 7  Pain Location: low back and head (pt reports chronic pain) Pain Descriptors / Indicators: Aching Pain Intervention(s): Limited activity within patient's tolerance;Monitored during session;Patient requesting pain meds-RN notified    Home Living Family/patient expects to be discharged to:: Private residence Living Arrangements: Spouse/significant other Available Help at Discharge: Family;Available PRN/intermittently   Home Access: Stairs to enter Entrance Stairs-Rails: Can reach both;Right;Left Entrance Stairs-Number of Steps: 3 Home Layout: One level Home Equipment: Wheelchair - manual;Cane - single point;Walker - 4 wheels;Shower seat;Grab bars - tub/shower;Toilet riser      Prior Function Level of Independence: Needs assistance   Gait / Transfers Assistance Needed: assist for stairs, mod I for household ambulation     Comments: furniture walks to bathroom, assist for shower transfer, on 2L O2 at baseline     Hand Dominance        Extremity/Trunk Assessment   Upper Extremity Assessment Upper Extremity Assessment: Defer  to OT evaluation;Overall WFL for tasks assessed    Lower Extremity Assessment Lower Extremity Assessment: Generalized weakness(knee ext AROM -20*B, knee ext strength +3/5 within available ROM)    Cervical / Trunk Assessment Cervical / Trunk  Assessment: Normal  Communication   Communication: No difficulties  Cognition Arousal/Alertness: Awake/alert Behavior During Therapy: WFL for tasks assessed/performed Overall Cognitive Status: Within Functional Limits for tasks assessed                                        General Comments      Exercises     Assessment/Plan    PT Assessment Patient needs continued PT services  PT Problem List Decreased strength;Decreased mobility;Decreased activity tolerance;Cardiopulmonary status limiting activity;Decreased balance       PT Treatment Interventions Gait training;Therapeutic activities;Balance training;Functional mobility training;Patient/family education    PT Goals (Current goals can be found in the Care Plan section)  Acute Rehab PT Goals Patient Stated Goal: to be with her grandkids PT Goal Formulation: With patient Time For Goal Achievement: 09/09/17 Potential to Achieve Goals: Good    Frequency Min 3X/week   Barriers to discharge        Co-evaluation               AM-PAC PT "6 Clicks" Daily Activity  Outcome Measure Difficulty turning over in bed (including adjusting bedclothes, sheets and blankets)?: A Little Difficulty moving from lying on back to sitting on the side of the bed? : A Little Difficulty sitting down on and standing up from a chair with arms (e.g., wheelchair, bedside commode, etc,.)?: A Little Help needed moving to and from a bed to chair (including a wheelchair)?: A Little Help needed walking in hospital room?: A Lot Help needed climbing 3-5 steps with a railing? : Total 6 Click Score: 15    End of Session Equipment Utilized During Treatment: Oxygen;Gait belt Activity Tolerance: Patient limited by fatigue Patient left: in chair;with call bell/phone within reach;with nursing/sitter in room Nurse Communication: Mobility status PT Visit Diagnosis: Muscle weakness (generalized) (M62.81);Difficulty in walking, not  elsewhere classified (R26.2);Dizziness and giddiness (R42);Pain;History of falling (Z91.81) Pain - part of body: (back)    Time: 9485-4627 PT Time Calculation (min) (ACUTE ONLY): 32 min   Charges:   PT Evaluation $PT Eval Low Complexity: 1 Low PT Treatments $Therapeutic Activity: 8-22 mins   PT G Codes:          Philomena Doheny 08/26/2017, 2:38 PM [phone]

## 2017-08-27 DIAGNOSIS — I509 Heart failure, unspecified: Secondary | ICD-10-CM

## 2017-08-27 DIAGNOSIS — D649 Anemia, unspecified: Secondary | ICD-10-CM | POA: Diagnosis not present

## 2017-08-27 DIAGNOSIS — R0602 Shortness of breath: Secondary | ICD-10-CM | POA: Diagnosis not present

## 2017-08-27 LAB — CBC
HEMATOCRIT: 27.4 % — AB (ref 36.0–46.0)
HEMOGLOBIN: 7.6 g/dL — AB (ref 12.0–15.0)
MCH: 21.4 pg — AB (ref 26.0–34.0)
MCHC: 27.7 g/dL — AB (ref 30.0–36.0)
MCV: 77.2 fL — AB (ref 78.0–100.0)
Platelets: 238 10*3/uL (ref 150–400)
RBC: 3.55 MIL/uL — AB (ref 3.87–5.11)
RDW: 20 % — ABNORMAL HIGH (ref 11.5–15.5)
WBC: 9.3 10*3/uL (ref 4.0–10.5)

## 2017-08-27 LAB — HEMOGLOBIN AND HEMATOCRIT, BLOOD
HCT: 31.7 % — ABNORMAL LOW (ref 36.0–46.0)
Hemoglobin: 9 g/dL — ABNORMAL LOW (ref 12.0–15.0)

## 2017-08-27 LAB — PREPARE RBC (CROSSMATCH)

## 2017-08-27 LAB — HAPTOGLOBIN: Haptoglobin: 112 mg/dL (ref 34–200)

## 2017-08-27 MED ORDER — SODIUM CHLORIDE 0.9 % IV SOLN: Freq: Once | INTRAVENOUS | Status: DC

## 2017-08-27 MED ORDER — IRON 325 (65 FE) MG PO TABS: 68.0000 mg | ORAL_TABLET | Freq: Every day | ORAL | 0 refills | Status: AC

## 2017-08-27 NOTE — Care Management Note (Signed)
Case Management Note  Patient Details  Name: Tasha Clarke MRN: [id number] Date of Birth: 1951/03/28  Subjective/Objective:  COPD exac                  Action/Plan: Spoke to pt and she has oxygen at home. Pt has RW, wheelchair, RW with seat,  CPAP, neb machine, cane, and oxygen. [facility] arranged for HHPT. (please see previous NCM notes). Pt states she will keep her oxygen company. She has oxygen concentrator at home.   Expected Discharge Date:  08/27/17               Expected Discharge Plan:  [place]  In-House Referral:  NA  Discharge planning Services  CM Consult  Post Acute Care Choice:  Durable Medical Equipment(home 02-Healthy @ home,rw) Choice offered to:  Patient  DME Arranged:  N/A DME Agency:  NA  HH Arranged:  PT [place] Agency:  [place]  Status of Service:  Completed, signed off  If discussed at [place] of Stay Meetings, dates discussed:    Additional Comments:  Erenest Rasher, RN 08/27/2017, 4:51 PM

## 2017-08-27 NOTE — Discharge Summary (Signed)
Physician Discharge Summary  Tasha Clarke XIP:[id number] DOB: [birth year] DOA: 08/25/2017  PCP: Dettinger, Fransisca Kaufmann, MD  Admit date: 08/25/2017 Discharge date: 08/27/2017  Time spent: > 35 minutes  Recommendations for Outpatient Follow-up:  1. Monitor hgb levels 2. Ensure patient is taking Iron supplementation accordingly. Will d/c with Ferrous sulfate 325 mg po tid   Discharge Diagnoses:  Principal Problem:   Anemia Active Problems:   COPD (chronic obstructive pulmonary disease) (HCC)   Seizure disorder (HCC)   Depression, recurrent (HCC)   Osteoporosis   Chronic pain syndrome   Hypothyroid   Hypertension   Discharge Condition: stable  Diet recommendation:   Filed Weights   08/25/17 1557 08/26/17 0519 08/27/17 0649  Weight: 102.9 kg (226 lb 13.7 oz) 104.3 kg (229 lb 15 oz) 100.7 kg (222 lb 0.1 oz)    History of present illness:  67 y.o. female with medical history significant of multiple back surgeries complicated by chronic low back pain status post spinal cord stimulator, anemia of unclear etiology, hypothyroidism, coronary artery disease status post 2 stents, seizure disorder, COPD on 2 L chronically, active tobacco abuse, hypertension, hyperlipidemia who was called by her PCP and told to present to the ED for anemia.   Hospital Course:  Principal Problem:   Anemia - no active bleeding noted on exam. LDH is within normal limits - MCV is below 80 and iron levels are low. As such I suspect iron deficiency anemia. - Patient reportedly had a normal colonoscopy roughly 6 years ago. I would recommend follow-up with GI after hospital discharge - Will d/c on ferrous sulfate 325 mg po tid  Active Problems:   COPD (chronic obstructive pulmonary disease) ([place]) - stable currently continue prior to admission medication regimen.    Seizure disorder ([place]) - stable on Keppra continue on d/c    Depression, recurrent ([place]) - Pt on cymbalta continue on d/c    Osteoporosis   Chronic pain syndrome - continue prior to admission medication regimen.    Hypothyroid - stable on synthroid will continue on d/c    Hypertension - continue on d/c prior to admission medication regimen.   Procedures:  None  Consultations:  None  Discharge Exam: Vitals:   08/27/17 1136 08/27/17 1221  BP: (!) 117/43 (!) 128/45  Pulse: 72 70  Resp: 16 18  Temp: 98.3 F (36.8 C) 98.8 F (37.1 C)  SpO2: 97% 97%    General: Pt in nad, alert and awake Cardiovascular: rrr, no rubs Respiratory: no increased wob, no wheezes  Discharge Instructions   Discharge Instructions    Call MD for:  extreme fatigue   Complete by:  As directed    Call MD for:  temperature >100.4   Complete by:  As directed    Diet - low sodium heart healthy   Complete by:  As directed    Discharge instructions   Complete by:  As directed    Please follow up with your primary care physician in 1-2 weeks for further evaluation and recommendations regarding your recent iron deficiency anemia.   Increase activity slowly   Complete by:  As directed      Allergies as of 08/27/2017      Reactions   Cephalexin Hives   Ketorolac Tromethamine Hives   Lac Bovis Nausea And Vomiting   Imdur  [isosorbide Dinitrate]    Iodinated Diagnostic Agents    Other Other (See Comments)   Alka seltzer causes blurred vision, fainting   Sodium Bicarbonate-citric Acid  Milk-related Compounds Nausea And Vomiting      Medication List    TAKE these medications   albuterol 108 (90 Base) MCG/ACT inhaler Commonly known as:  PROVENTIL HFA;VENTOLIN HFA Inhale 2 puffs into the lungs every 6 (six) hours as needed for wheezing or shortness of breath.   alendronate 70 MG tablet Commonly known as:  FOSAMAX Take 70 mg by mouth every Monday. Take with a full glass of water on an empty stomach.   aspirin EC 81 MG tablet Take 81 mg by mouth daily.   atorvastatin 40 MG tablet Commonly known as:  LIPITOR Take 40 mg by  mouth daily.   BREO ELLIPTA 200-25 MCG/INH Aepb Generic drug:  fluticasone furoate-vilanterol Inhale 1 puff into the lungs daily.   cetirizine 10 MG tablet Commonly known as:  ZYRTEC Take 10 mg by mouth daily.   cyclobenzaprine 10 MG tablet Commonly known as:  FLEXERIL Take 10 mg by mouth 2 (two) times daily as needed for muscle spasms.   DULoxetine 30 MG capsule Commonly known as:  CYMBALTA Take 30 mg by mouth daily.   EPINEPHrine 0.3 mg/0.3 mL Soaj injection Commonly known as:  EPI-PEN Inject 0.3 mg into the muscle as needed.   Fish Oil 1000 MG Caps Take 1,000 mg by mouth daily.   FLONASE 50 MCG/ACT nasal spray Generic drug:  fluticasone Place 2 sprays into both nostrils daily.   Fluticasone-Salmeterol 250-50 MCG/DOSE Aepb Commonly known as:  ADVAIR Inhale 1 puff into the lungs 2 (two) times daily.   gabapentin 600 MG tablet Commonly known as:  NEURONTIN Take 600 mg by mouth 4 (four) times daily.   ipratropium-albuterol 0.5-2.5 (3) MG/3ML Soln Commonly known as:  DUONEB Take 3 mLs by nebulization every 6 (six) hours as needed (shortness of breathe).   Iron 325 (65 Fe) MG Tabs Take 1 tablet (325 mg total) by mouth daily. What changed:  how much to take   levETIRAcetam 750 MG tablet Commonly known as:  KEPPRA Take 750 mg by mouth every morning.   levETIRAcetam 1000 MG tablet Commonly known as:  KEPPRA Take 1,000 mg by mouth at bedtime.   levothyroxine 50 MCG tablet Commonly known as:  SYNTHROID, LEVOTHROID Take 50 mcg by mouth daily before breakfast.   losartan 50 MG tablet Commonly known as:  COZAAR Take 50 mg by mouth daily.   Magnesium 400 MG Caps Take 400 mg by mouth daily.   metoprolol tartrate 25 MG tablet Commonly known as:  LOPRESSOR Take 25 mg by mouth 2 (two) times daily.   Morphine-Naltrexone 20-0.8 MG Cpcr Take 1 capsule by mouth 2 (two) times daily.   multivitamin tablet Take 1 tablet by mouth daily.   NARCAN 4 MG/0.1ML Liqd nasal  spray kit Generic drug:  naloxone Place 1 spray into the nose daily as needed (overdose.).   TYLENOL PM EXTRA STRENGTH 25-500 MG Tabs tablet Generic drug:  diphenhydramine-acetaminophen Take 1 tablet by mouth at bedtime as needed (pain).   Vitamin D-3 5000 units Tabs Take 5,000 Units by mouth daily.      Allergies  Allergen Reactions  . Cephalexin Hives  . Ketorolac Tromethamine Hives  . Lac Bovis Nausea And Vomiting  . Imdur  [Isosorbide Dinitrate]   . Iodinated Diagnostic Agents   . Other Other (See Comments)    Alka seltzer causes blurred vision, fainting  . Sodium Bicarbonate-Citric Acid   . Milk-Related Compounds Nausea And Vomiting      The results of significant diagnostics  from this hospitalization (including imaging, microbiology, ancillary and laboratory) are listed below for reference.    Significant Diagnostic Studies: Dg Chest 2 View  Result Date: 08/25/2017 CLINICAL DATA:  Shortness of breath, low hemoglobin. History of asthma-COPD, current smoker. EXAM: CHEST  2 VIEW COMPARISON:  None in PACs FINDINGS: The lungs are adequately inflated. There is no focal infiltrate. The cardiac silhouette is enlarged. The pulmonary vascularity is engorged. There is mild diffuse interstitial prominence. There is calcification in the wall of the aortic arch. There is mild multilevel degenerative disc disease of the thoracic spine. Nerve stimulator electrodes are present at approximately the T9-T10 level of the thoracic spine. The patient has undergone previous lower anterior cervical fusion. IMPRESSION: CHF with mild interstitial edema.  No acute pneumonia. Thoracic aortic atherosclerosis. Electronically Signed   By: David  Martinique M.D.   On: 08/25/2017 11:16    Microbiology: No results found for this or any previous visit (from the past 240 hour(s)).   Labs: Basic Metabolic Panel: Recent Labs  Lab 08/24/17 1102 08/25/17 1020 08/26/17 0454  NA 139 138 138  K 5.2 4.2 4.7  CL  106 105 107  CO2 '20 24 22  ' GLUCOSE 80 108* 85  BUN 20 24* 21*  CREATININE 0.82 0.95 0.89  CALCIUM 9.2 9.3 8.8*   Liver Function Tests: Recent Labs  Lab 08/24/17 1102 08/25/17 1020  AST 19 22  ALT 14 15  ALKPHOS 92 92  BILITOT <0.2 0.4  PROT 6.2 6.8  ALBUMIN 3.5* 3.5   No results for input(s): LIPASE, AMYLASE in the last 168 hours. No results for input(s): AMMONIA in the last 168 hours. CBC: Recent Labs  Lab 08/24/17 1102 08/25/17 1020 08/26/17 0454 08/27/17 0006  WBC 7.1 8.2 8.2 9.3  NEUTROABS 4.5 5.4  --   --   HGB 6.6* 6.7* 7.2* 7.6*  HCT 24.1* 24.8* 24.8* 27.4*  MCV 74* 76.5* 77.0* 77.2*  PLT 259 301 249 238   Cardiac Enzymes: Recent Labs  Lab 08/25/17 1020  TROPONINI 0.03*   BNP: BNP (last 3 results) Recent Labs    08/25/17 1020  BNP 202.3*    ProBNP (last 3 results) No results for input(s): PROBNP in the last 8760 hours.  CBG: Recent Labs  Lab 08/26/17 0728  GLUCAP 98    Signed:  Velvet Bathe MD.  [facility] 08/27/2017, 2:56 PM

## 2017-08-28 LAB — TYPE AND SCREEN
ABO/RH(D): B POS
Antibody Screen: NEGATIVE
UNIT DIVISION: 0
Unit division: 0

## 2017-08-28 LAB — BPAM RBC
BLOOD PRODUCT EXPIRATION DATE: 201903062359
Blood Product Expiration Date: 201903052359
ISSUE DATE / TIME: 201902141423
ISSUE DATE / TIME: 201902161154
UNIT TYPE AND RH: 7300
Unit Type and Rh: 7300

## 2017-08-29 LAB — FOLATE RBC
Folate, Hemolysate: 333.9 ng/mL
Folate, RBC: 1274 ng/mL (ref >498)
Hematocrit: 26.2 % — ABNORMAL LOW (ref 34.0–46.6)

## 2017-08-30 ENCOUNTER — Other Ambulatory Visit: Payer: Self-pay | Admitting: Family Medicine

## 2017-08-30 NOTE — Telephone Encounter (Signed)
Patients husband will call us tomorrow with what medications are needed

## 2017-08-31 ENCOUNTER — Other Ambulatory Visit: Payer: Self-pay | Admitting: Family Medicine

## 2017-08-31 MED ORDER — FLUTICASONE FUROATE-VILANTEROL 200-25 MCG/INH IN AEPB: 1.0000 | INHALATION_SPRAY | Freq: Every day | RESPIRATORY_TRACT | 2 refills | Status: DC

## 2017-08-31 MED ORDER — LEVETIRACETAM 750 MG PO TABS: 750.0000 mg | ORAL_TABLET | ORAL | 2 refills | Status: DC

## 2017-08-31 MED ORDER — FLUTICASONE PROPIONATE 50 MCG/ACT NA SUSP: 2.0000 | Freq: Every day | NASAL | 2 refills | Status: DC

## 2017-08-31 MED ORDER — LEVETIRACETAM 1000 MG PO TABS: 1000.0000 mg | ORAL_TABLET | Freq: Every day | ORAL | 2 refills | Status: DC

## 2017-08-31 MED ORDER — CETIRIZINE HCL 10 MG PO TABS: 10.0000 mg | ORAL_TABLET | Freq: Every day | ORAL | 3 refills | Status: DC

## 2017-08-31 NOTE — Telephone Encounter (Signed)
Meds sent to pharmacy per patients request 

## 2017-09-14 DIAGNOSIS — M47812 Spondylosis without myelopathy or radiculopathy, cervical region: Secondary | ICD-10-CM | POA: Diagnosis not present

## 2017-09-14 DIAGNOSIS — Z9689 Presence of other specified functional implants: Secondary | ICD-10-CM | POA: Diagnosis not present

## 2017-09-14 DIAGNOSIS — G894 Chronic pain syndrome: Secondary | ICD-10-CM | POA: Diagnosis not present

## 2017-09-14 DIAGNOSIS — M961 Postlaminectomy syndrome, not elsewhere classified: Secondary | ICD-10-CM | POA: Diagnosis not present

## 2017-09-23 ENCOUNTER — Encounter: Payer: Self-pay | Admitting: Family Medicine

## 2017-09-23 ENCOUNTER — Ambulatory Visit: Payer: Medicare PPO | Admitting: Family Medicine

## 2017-09-23 VITALS — BP 124/69 | HR 71 | Temp 98.9°F

## 2017-09-23 DIAGNOSIS — I10 Essential (primary) hypertension: Secondary | ICD-10-CM | POA: Diagnosis not present

## 2017-09-23 DIAGNOSIS — M65332 Trigger finger, left middle finger: Secondary | ICD-10-CM

## 2017-09-23 DIAGNOSIS — D508 Other iron deficiency anemias: Secondary | ICD-10-CM

## 2017-09-23 DIAGNOSIS — F339 Major depressive disorder, recurrent, unspecified: Secondary | ICD-10-CM

## 2017-09-23 LAB — CBC WITH DIFFERENTIAL/PLATELET
BASOS ABS: 0.1 10*3/uL (ref 0.0–0.2)
BASOS: 1 %
EOS (ABSOLUTE): 0.3 10*3/uL (ref 0.0–0.4)
Eos: 5 %
Hematocrit: 31.5 % — ABNORMAL LOW (ref 34.0–46.6)
Hemoglobin: 9.1 g/dL — ABNORMAL LOW (ref 11.1–15.9)
IMMATURE GRANS (ABS): 0 10*3/uL (ref 0.0–0.1)
Immature Granulocytes: 0 %
LYMPHS: 27 %
Lymphocytes Absolute: 1.8 10*3/uL (ref 0.7–3.1)
MCH: 23.2 pg — AB (ref 26.6–33.0)
MCHC: 28.9 g/dL — AB (ref 31.5–35.7)
MCV: 80 fL (ref 79–97)
MONOS ABS: 0.8 10*3/uL (ref 0.1–0.9)
Monocytes: 12 %
NEUTROS ABS: 3.8 10*3/uL (ref 1.4–7.0)
Neutrophils: 55 %
PLATELETS: 267 10*3/uL (ref 150–379)
RBC: 3.93 x10E6/uL (ref 3.77–5.28)
RDW: 21.8 % — AB (ref 12.3–15.4)
WBC: 6.8 10*3/uL (ref 3.4–10.8)

## 2017-09-23 LAB — BMP8+EGFR
BUN / CREAT RATIO: 15 (ref 12–28)
BUN: 13 mg/dL (ref 8–27)
CHLORIDE: 101 mmol/L (ref 96–106)
CO2: 26 mmol/L (ref 20–29)
Calcium: 9.2 mg/dL (ref 8.7–10.3)
Creatinine, Ser: 0.88 mg/dL (ref 0.57–1.00)
GFR calc Af Amer: 80 mL/min/{1.73_m2} (ref >59)
GFR calc non Af Amer: 69 mL/min/{1.73_m2} (ref >59)
Glucose: 83 mg/dL (ref 65–99)
POTASSIUM: 4.5 mmol/L (ref 3.5–5.2)
SODIUM: 142 mmol/L (ref 134–144)

## 2017-09-23 MED ORDER — FUROSEMIDE 20 MG PO TABS: 20.0000 mg | ORAL_TABLET | Freq: Every day | ORAL | 2 refills | Status: DC | PRN

## 2017-09-26 ENCOUNTER — Telehealth: Payer: Self-pay | Admitting: Family Medicine

## 2017-09-26 MED ORDER — TELMISARTAN 40 MG PO TABS: 40.0000 mg | ORAL_TABLET | Freq: Every day | ORAL | 1 refills | Status: DC

## 2017-09-26 NOTE — Telephone Encounter (Signed)
Patient's husband aware °

## 2017-09-26 NOTE — Telephone Encounter (Signed)
Please tell the patient that I sent in micardis for her

## 2017-09-26 NOTE — Telephone Encounter (Signed)
-----   Message from Carrolyn Leigh, LPN sent at 02/05/6183 11:37 AM EDT ----- Husband aware, Her losartan has been recalled, needs replacement BP med

## 2017-09-27 ENCOUNTER — Telehealth: Payer: Self-pay | Admitting: Family Medicine

## 2017-09-27 MED ORDER — LOSARTAN POTASSIUM 100 MG PO TABS: ORAL_TABLET | ORAL | 1 refills | Status: DC

## 2017-09-27 NOTE — Telephone Encounter (Signed)
FYI, received call from [facility].  Since patient's Losartan 50 mg was on recall/backorder, we had substituted this with Telmisartan 40 mg.  The pharmacist called to let us know this drug is on backorder until May.  They do have Losartan 100 mg which has not been recalled, patient could take 1/2 tablet daily.  Calling for okay to substitute this.  I advised them this would be fine and sent in a new prescription for this.

## 2017-09-27 NOTE — Telephone Encounter (Signed)
Okay sounds good. thanks ?

## 2017-09-28 NOTE — Progress Notes (Signed)
BP 124/69   Pulse 71   Temp 98.9 F (37.2 C) (Oral)    Subjective:    Patient ID: Tasha Clarke, female    DOB: February 01, 1951, 67 y.o.   MRN: [id number]  HPI: Tasha Clarke is a 67 y.o. female presenting on 09/23/2017 for Hypertension (4 week follow up; discuss Losartan, received letter from [facility] about recalls) and Depression   HPI Hypertension Patient is currently on losartan and metoprolol, and their blood pressure today is 124/69. Patient denies any lightheadedness or dizziness. Patient denies headaches, blurred vision, chest pains, shortness of breath, or weakness. Denies any side effects from medication and is content with current medication.   Iron deficiency anemia Patient denies any known bleeding but just has been feeling more fatigued recently and wants to her labs checked out.  She has been trying to increase her dietary intake of protein and iron and also takes an iron supplement.  Depression Patient comes in to discuss depression and anxiety, patient is currently on Cymbalta and still has it from her previous provider and has been doing well on it.  From her anemia and wants to get both checked out.  Finger catching and popping Feels like the middle finger and pinky finger on her left hand will sometimes get caught flexed and is not to pop them to get them unlocked or get him back to straight completely.  She says she has had a similar surgery for her middle finger on her left hand but these just started bothering her over the past 2 weeks.  She denies any pain in it except for when they get caught.  She denies any redness or warmth  Relevant past medical, surgical, family and social history reviewed and updated as indicated. Interim medical history since our last visit reviewed. Allergies and medications reviewed and updated.  Review of Systems  Constitutional: Positive for fatigue. Negative for chills and fever.  HENT: Negative for congestion, ear discharge and ear  pain.   Eyes: Negative for visual disturbance.  Respiratory: Negative for chest tightness and shortness of breath.   Cardiovascular: Negative for chest pain and leg swelling.  Musculoskeletal: Positive for arthralgias. Negative for back pain, gait problem, joint swelling and myalgias.  Skin: Negative for color change and rash.  Neurological: Negative for light-headedness and headaches.  Psychiatric/Behavioral: Positive for dysphoric mood. Negative for agitation, behavioral problems, self-injury, sleep disturbance and suicidal ideas. The patient is nervous/anxious.   All other systems reviewed and are negative.   Per HPI unless specifically indicated above   Allergies as of 09/23/2017      Reactions   Cephalexin Hives   Ketorolac Tromethamine Hives   Lac Bovis Nausea And Vomiting   Imdur  [isosorbide Dinitrate]    Iodinated Diagnostic Agents    Other Other (See Comments)   Alka seltzer causes blurred vision, fainting   Sodium Bicarbonate-citric Acid    Milk-related Compounds Nausea And Vomiting      Medication List        Accurate as of 09/23/17 11:59 PM. Always use your most recent med list.          albuterol 108 (90 Base) MCG/ACT inhaler Commonly known as:  PROVENTIL HFA;VENTOLIN HFA Inhale 2 puffs into the lungs every 6 (six) hours as needed for wheezing or shortness of breath.   alendronate 70 MG tablet Commonly known as:  FOSAMAX Take 70 mg by mouth every Monday. Take with a full glass of water on an empty stomach.  aspirin EC 81 MG tablet Take 81 mg by mouth daily.   atorvastatin 40 MG tablet Commonly known as:  LIPITOR Take 40 mg by mouth daily.   cetirizine 10 MG tablet Commonly known as:  ZYRTEC Take 1 tablet (10 mg total) by mouth daily.   cyclobenzaprine 10 MG tablet Commonly known as:  FLEXERIL Take 10 mg by mouth 2 (two) times daily as needed for muscle spasms.   DULoxetine 30 MG capsule Commonly known as:  CYMBALTA Take 30 mg by mouth daily.     EPINEPHrine 0.3 mg/0.3 mL Soaj injection Commonly known as:  EPI-PEN Inject 0.3 mg into the muscle as needed.   Fish Oil 1000 MG Caps Take 1,000 mg by mouth daily.   fluticasone 50 MCG/ACT nasal spray Commonly known as:  FLONASE Place 2 sprays into both nostrils daily.   fluticasone furoate-vilanterol 200-25 MCG/INH Aepb Commonly known as:  BREO ELLIPTA Inhale 1 puff into the lungs daily.   Fluticasone-Salmeterol 250-50 MCG/DOSE Aepb Commonly known as:  ADVAIR Inhale 1 puff into the lungs 2 (two) times daily.   furosemide 20 MG tablet Commonly known as:  LASIX Take 1 tablet (20 mg total) by mouth daily as needed.   gabapentin 600 MG tablet Commonly known as:  NEURONTIN Take 600 mg by mouth 4 (four) times daily.   ipratropium-albuterol 0.5-2.5 (3) MG/3ML Soln Commonly known as:  DUONEB Take 3 mLs by nebulization every 6 (six) hours as needed (shortness of breathe).   Iron 325 (65 Fe) MG Tabs Take 1 tablet (325 mg total) by mouth daily.   levETIRAcetam 1000 MG tablet Commonly known as:  KEPPRA Take 1 tablet (1,000 mg total) by mouth at bedtime.   levETIRAcetam 750 MG tablet Commonly known as:  KEPPRA Take 1 tablet (750 mg total) by mouth every morning.   levothyroxine 50 MCG tablet Commonly known as:  SYNTHROID, LEVOTHROID Take 50 mcg by mouth daily before breakfast.   losartan 50 MG tablet Commonly known as:  COZAAR Take 50 mg by mouth daily.   Magnesium 400 MG Caps Take 400 mg by mouth daily.   metoprolol tartrate 25 MG tablet Commonly known as:  LOPRESSOR Take 25 mg by mouth 2 (two) times daily.   Morphine-Naltrexone 20-0.8 MG Cpcr Take 1 capsule by mouth 2 (two) times daily.   multivitamin tablet Take 1 tablet by mouth daily.   NARCAN 4 MG/0.1ML Liqd nasal spray kit Generic drug:  naloxone Place 1 spray into the nose daily as needed (overdose.).   TYLENOL PM EXTRA STRENGTH 25-500 MG Tabs tablet Generic drug:  diphenhydramine-acetaminophen Take  1 tablet by mouth at bedtime as needed (pain).   Vitamin D-3 5000 units Tabs Take 5,000 Units by mouth daily.          Objective:    BP 124/69   Pulse 71   Temp 98.9 F (37.2 C) (Oral)   Wt Readings from Last 3 Encounters:  08/27/17 222 lb 0.1 oz (100.7 kg)  08/24/17 225 lb (102.1 kg)    Physical Exam  Constitutional: She is oriented to person, place, and time. She appears well-developed and well-nourished. No distress.  Eyes: Conjunctivae are normal.  Neck: Neck supple. No thyromegaly present.  Cardiovascular: Normal rate, regular rhythm, normal heart sounds and intact distal pulses.  No murmur heard. Pulmonary/Chest: Effort normal and breath sounds normal. No respiratory distress. She has no wheezes. She has no rales.  Musculoskeletal: Normal range of motion. She exhibits tenderness (Tenderness and small nodule  over tendon on the palm just proximal to her left ring finger). She exhibits no edema.  Lymphadenopathy:    She has no cervical adenopathy.  Neurological: She is alert and oriented to person, place, and time. Coordination normal.  Skin: Skin is warm and dry. No rash noted. She is not diaphoretic.  Psychiatric: She has a normal mood and affect. Her behavior is normal.  Nursing note and vitals reviewed.       Assessment & Plan:   Problem List Items Addressed This Visit      Cardiovascular and Mediastinum   Hypertension   Relevant Medications   furosemide (LASIX) 20 MG tablet   Other Relevant Orders   BMP8+EGFR (Completed)     Other   Depression, recurrent (HCC)   Anemia - Primary   Relevant Orders   CBC with Differential/Platelet (Completed)   CBC with Differential/Platelet    Other Visit Diagnoses    Trigger middle finger of left hand       Also has an pinky finger of left hand, will do anti-inflammatories consider injection in future and orthopedic referral if needed       Follow up plan: Return if symptoms worsen or fail to  improve.  Counseling provided for all of the vaccine components Orders Placed This Encounter  Procedures  . CBC with Differential/Platelet  . CBC with Differential/Platelet  . BMP8+EGFR    Caryl Pina, MD [facility] 09/23/2017, 4:53 PM

## 2017-10-14 DIAGNOSIS — M47812 Spondylosis without myelopathy or radiculopathy, cervical region: Secondary | ICD-10-CM | POA: Diagnosis not present

## 2017-10-24 ENCOUNTER — Encounter: Payer: Self-pay | Admitting: Family Medicine

## 2017-10-24 ENCOUNTER — Ambulatory Visit (INDEPENDENT_AMBULATORY_CARE_PROVIDER_SITE_OTHER): Payer: Medicare PPO

## 2017-10-24 ENCOUNTER — Ambulatory Visit: Payer: Medicare PPO | Admitting: Family Medicine

## 2017-10-24 VITALS — BP 124/72 | HR 77 | Temp 98.4°F

## 2017-10-24 DIAGNOSIS — R222 Localized swelling, mass and lump, trunk: Secondary | ICD-10-CM

## 2017-10-24 DIAGNOSIS — R6889 Other general symptoms and signs: Secondary | ICD-10-CM | POA: Diagnosis not present

## 2017-10-24 DIAGNOSIS — I700 Atherosclerosis of aorta: Secondary | ICD-10-CM | POA: Diagnosis not present

## 2017-10-24 DIAGNOSIS — D508 Other iron deficiency anemias: Secondary | ICD-10-CM

## 2017-10-24 DIAGNOSIS — J439 Emphysema, unspecified: Secondary | ICD-10-CM | POA: Diagnosis not present

## 2017-10-24 NOTE — Progress Notes (Signed)
BP 124/72   Pulse 77   Temp 98.4 F (36.9 C) (Oral)    Subjective:    Patient ID: Tasha Clarke, female    DOB: 19-Nov-1950, 67 y.o.   MRN: [id number]  HPI: Tasha Clarke is a 67 y.o. female presenting on 10/24/2017 for Medication Refill (Gabapentin)   HPI Anemia recheck Patient has been feeling weak and just with less energy.  She does not mostly feel like it is of breathing but does need it refill on her inhaler.  She feels like it is her iron but slow would like to have it rechecked.  She denies any signs of bleeding that she has noticed.  She just feels more lightheaded and more weak than she was previously.  She has felt intermittently like this over the past month.  Anterior chest wall nodule Patient feels a palpable nodule/lump on the lower part of her sternum near the xiphoid process that has been there for a few weeks.  She does not know what the lump is and wants to get it checked out.  She says that it is nontender but she feels like she can move it around.  She does not know if one time in the past she may have fallen and broken her xiphoid process but has mainly noticed it over the past month.  Relevant past medical, surgical, family and social history reviewed and updated as indicated. Interim medical history since our last visit reviewed. Allergies and medications reviewed and updated.  Review of Systems  Constitutional: Positive for fatigue. Negative for chills and fever.  HENT: Negative for congestion, ear discharge and ear pain.   Eyes: Negative for redness and visual disturbance.  Respiratory: Positive for cough. Negative for chest tightness, shortness of breath and wheezing.   Cardiovascular: Negative for chest pain and leg swelling.  Genitourinary: Negative for difficulty urinating and dysuria.  Musculoskeletal: Negative for back pain and gait problem.  Skin: Negative for rash.  Neurological: Positive for weakness. Negative for dizziness, speech difficulty,  light-headedness, numbness and headaches.  Psychiatric/Behavioral: Negative for agitation and behavioral problems.  All other systems reviewed and are negative.   Per HPI unless specifically indicated above   Allergies as of 10/24/2017      Reactions   Cephalexin Hives   Ketorolac Tromethamine Hives   Lac Bovis Nausea And Vomiting   Imdur  [isosorbide Dinitrate]    Iodinated Diagnostic Agents    Other Other (See Comments)   Alka seltzer causes blurred vision, fainting   Sodium Bicarbonate-citric Acid    Milk-related Compounds Nausea And Vomiting      Medication List        Accurate as of 10/24/17  8:44 AM. Always use your most recent med list.          albuterol 108 (90 Base) MCG/ACT inhaler Commonly known as:  PROVENTIL HFA;VENTOLIN HFA Inhale 2 puffs into the lungs every 6 (six) hours as needed for wheezing or shortness of breath.   alendronate 70 MG tablet Commonly known as:  FOSAMAX Take 70 mg by mouth every Monday. Take with a full glass of water on an empty stomach.   aspirin EC 81 MG tablet Take 81 mg by mouth daily.   atorvastatin 40 MG tablet Commonly known as:  LIPITOR Take 40 mg by mouth daily.   cetirizine 10 MG tablet Commonly known as:  ZYRTEC Take 1 tablet (10 mg total) by mouth daily.   cyclobenzaprine 10 MG tablet Commonly known as:  FLEXERIL Take 10 mg by mouth 2 (two) times daily as needed for muscle spasms.   DULoxetine 30 MG capsule Commonly known as:  CYMBALTA Take 30 mg by mouth daily.   EPINEPHrine 0.3 mg/0.3 mL Soaj injection Commonly known as:  EPI-PEN Inject 0.3 mg into the muscle as needed.   Fish Oil 1000 MG Caps Take 1,000 mg by mouth daily.   fluticasone 50 MCG/ACT nasal spray Commonly known as:  FLONASE Place 2 sprays into both nostrils daily.   fluticasone furoate-vilanterol 200-25 MCG/INH Aepb Commonly known as:  BREO ELLIPTA Inhale 1 puff into the lungs daily.   Fluticasone-Salmeterol 250-50 MCG/DOSE  Aepb Commonly known as:  ADVAIR Inhale 1 puff into the lungs 2 (two) times daily.   furosemide 20 MG tablet Commonly known as:  LASIX Take 1 tablet (20 mg total) by mouth daily as needed.   gabapentin 600 MG tablet Commonly known as:  NEURONTIN Take 600 mg by mouth 4 (four) times daily.   ipratropium-albuterol 0.5-2.5 (3) MG/3ML Soln Commonly known as:  DUONEB Take 3 mLs by nebulization every 6 (six) hours as needed (shortness of breathe).   Iron 325 (65 Fe) MG Tabs Take 1 tablet (325 mg total) by mouth daily.   levETIRAcetam 1000 MG tablet Commonly known as:  KEPPRA Take 1 tablet (1,000 mg total) by mouth at bedtime.   levETIRAcetam 750 MG tablet Commonly known as:  KEPPRA Take 1 tablet (750 mg total) by mouth every morning.   levothyroxine 50 MCG tablet Commonly known as:  SYNTHROID, LEVOTHROID Take 50 mcg by mouth daily before breakfast.   losartan 100 MG tablet Commonly known as:  COZAAR Take 1/2 tablet daily.   Magnesium 400 MG Caps Take 400 mg by mouth daily.   metoprolol tartrate 25 MG tablet Commonly known as:  LOPRESSOR Take 25 mg by mouth 2 (two) times daily.   Morphine-Naltrexone 20-0.8 MG Cpcr Take 1 capsule by mouth 2 (two) times daily.   multivitamin tablet Take 1 tablet by mouth daily.   NARCAN 4 MG/0.1ML Liqd nasal spray kit Generic drug:  naloxone Place 1 spray into the nose daily as needed (overdose.).   TYLENOL PM EXTRA STRENGTH 25-500 MG Tabs tablet Generic drug:  diphenhydramine-acetaminophen Take 1 tablet by mouth at bedtime as needed (pain).   Vitamin D-3 5000 units Tabs Take 5,000 Units by mouth daily.          Objective:    BP 124/72   Pulse 77   Temp 98.4 F (36.9 C) (Oral)   Wt Readings from Last 3 Encounters:  08/27/17 222 lb 0.1 oz (100.7 kg)  08/24/17 225 lb (102.1 kg)    Physical Exam  Constitutional: She is oriented to person, place, and time. She appears well-developed and well-nourished. No distress.  Eyes:  Conjunctivae are normal.  Cardiovascular: Normal rate, regular rhythm, normal heart sounds and intact distal pulses.  No murmur heard. Pulmonary/Chest: Effort normal and breath sounds normal. No respiratory distress. She has no wheezes.  Abdominal: Soft. Bowel sounds are normal. She exhibits no distension. There is no tenderness.  Musculoskeletal: Normal range of motion. She exhibits no edema or tenderness.  Lymphadenopathy:  Palpable mobile nodule in the lower sternal region central, could be part of the xiphoid process or possibly a lymph node.  Neurological: She is alert and oriented to person, place, and time. Coordination normal.  Skin: Skin is warm and dry. No rash noted. She is not diaphoretic.  Psychiatric: She has a normal  mood and affect. Her behavior is normal.  Nursing note and vitals reviewed.       Assessment & Plan:   Problem List Items Addressed This Visit      Respiratory   COPD (chronic obstructive pulmonary disease) ([place])     Other   Anemia - Primary   Relevant Orders   Vitamin B12   Anemia Profile B (Completed)    Other Visit Diagnoses    Nodule of anterior chest wall       Relevant Orders   DG Chest 2 View (Completed)       Follow up plan: Return if symptoms worsen or fail to improve.  Counseling provided for all of the vaccine components Orders Placed This Encounter  Procedures  . DG Chest 2 View  . Vitamin B12  . Anemia Profile B    Caryl Pina, MD [facility] 10/24/2017, 8:44 AM

## 2017-10-25 ENCOUNTER — Other Ambulatory Visit: Payer: Self-pay | Admitting: *Deleted

## 2017-10-25 LAB — ANEMIA PROFILE B
BASOS ABS: 0 10*3/uL (ref 0.0–0.2)
Basos: 0 %
EOS (ABSOLUTE): 0.4 10*3/uL (ref 0.0–0.4)
Eos: 6 %
FOLATE: 3.6 ng/mL (ref >3.0)
Ferritin: 11 ng/mL — ABNORMAL LOW (ref 15–150)
Hematocrit: 33.1 % — ABNORMAL LOW (ref 34.0–46.6)
Hemoglobin: 9.7 g/dL — ABNORMAL LOW (ref 11.1–15.9)
Immature Grans (Abs): 0 10*3/uL (ref 0.0–0.1)
Immature Granulocytes: 0 %
Iron Saturation: 4 % — CL (ref 15–55)
Iron: 17 ug/dL — ABNORMAL LOW (ref 27–139)
LYMPHS ABS: 2 10*3/uL (ref 0.7–3.1)
Lymphs: 29 %
MCH: 23.3 pg — AB (ref 26.6–33.0)
MCHC: 29.3 g/dL — AB (ref 31.5–35.7)
MCV: 79 fL (ref 79–97)
MONOS ABS: 0.8 10*3/uL (ref 0.1–0.9)
Monocytes: 11 %
NEUTROS ABS: 3.8 10*3/uL (ref 1.4–7.0)
Neutrophils: 54 %
PLATELETS: 342 10*3/uL (ref 150–379)
RBC: 4.17 x10E6/uL (ref 3.77–5.28)
RDW: 18.9 % — AB (ref 12.3–15.4)
Retic Ct Pct: 1.3 % (ref 0.6–2.6)
Total Iron Binding Capacity: 463 ug/dL — ABNORMAL HIGH (ref 250–450)
UIBC: 446 ug/dL — AB (ref 118–369)
VITAMIN B 12: 286 pg/mL (ref 232–1245)
WBC: 7 10*3/uL (ref 3.4–10.8)

## 2017-10-26 ENCOUNTER — Other Ambulatory Visit: Payer: Self-pay

## 2017-10-26 ENCOUNTER — Other Ambulatory Visit: Payer: Self-pay | Admitting: *Deleted

## 2017-10-26 ENCOUNTER — Telehealth: Payer: Self-pay | Admitting: Family Medicine

## 2017-10-26 ENCOUNTER — Encounter (HOSPITAL_COMMUNITY): Payer: Self-pay | Admitting: Emergency Medicine

## 2017-10-26 DIAGNOSIS — M799 Soft tissue disorder, unspecified: Secondary | ICD-10-CM

## 2017-10-26 DIAGNOSIS — D508 Other iron deficiency anemias: Secondary | ICD-10-CM

## 2017-10-26 DIAGNOSIS — M7989 Other specified soft tissue disorders: Secondary | ICD-10-CM

## 2017-10-26 MED ORDER — GABAPENTIN 600 MG PO TABS: 600.0000 mg | ORAL_TABLET | Freq: Four times a day (QID) | ORAL | 2 refills | Status: DC

## 2017-10-26 NOTE — Telephone Encounter (Signed)
I guess we will just have to do the scan without contrast then

## 2017-10-26 NOTE — Telephone Encounter (Signed)
Anterior chest wall soft tissue mass in the lower sternal border, could be part of the xiphoid that is deformed a fracture.  Also could be lymphadenopathy, will do CT chest with contrast, chest x-ray was noninformative

## 2017-10-26 NOTE — Telephone Encounter (Signed)
Patient is allergic to contrast.  She says it is on her allergy list.  Please advise.

## 2017-10-28 ENCOUNTER — Emergency Department (HOSPITAL_COMMUNITY): Payer: Medicare PPO

## 2017-10-28 ENCOUNTER — Encounter (HOSPITAL_COMMUNITY): Payer: Self-pay

## 2017-10-28 ENCOUNTER — Inpatient Hospital Stay (HOSPITAL_COMMUNITY)
Admission: EM | Admit: 2017-10-28 | Discharge: 2017-10-31 | DRG: 871 | Disposition: A | Discharge status: Home / Self Care | Payer: Medicare PPO | Attending: Internal Medicine | Admitting: Internal Medicine

## 2017-10-28 DIAGNOSIS — F339 Major depressive disorder, recurrent, unspecified: Secondary | ICD-10-CM | POA: Diagnosis present

## 2017-10-28 DIAGNOSIS — Z9989 Dependence on other enabling machines and devices: Secondary | ICD-10-CM | POA: Diagnosis not present

## 2017-10-28 DIAGNOSIS — I959 Hypotension, unspecified: Secondary | ICD-10-CM | POA: Diagnosis not present

## 2017-10-28 DIAGNOSIS — E861 Hypovolemia: Secondary | ICD-10-CM | POA: Diagnosis not present

## 2017-10-28 DIAGNOSIS — D509 Iron deficiency anemia, unspecified: Secondary | ICD-10-CM | POA: Diagnosis present

## 2017-10-28 DIAGNOSIS — I493 Ventricular premature depolarization: Secondary | ICD-10-CM | POA: Diagnosis present

## 2017-10-28 DIAGNOSIS — M810 Age-related osteoporosis without current pathological fracture: Secondary | ICD-10-CM | POA: Diagnosis present

## 2017-10-28 DIAGNOSIS — N179 Acute kidney failure, unspecified: Secondary | ICD-10-CM | POA: Diagnosis not present

## 2017-10-28 DIAGNOSIS — Z7951 Long term (current) use of inhaled steroids: Secondary | ICD-10-CM

## 2017-10-28 DIAGNOSIS — Z825 Family history of asthma and other chronic lower respiratory diseases: Secondary | ICD-10-CM

## 2017-10-28 DIAGNOSIS — R579 Shock, unspecified: Secondary | ICD-10-CM | POA: Diagnosis not present

## 2017-10-28 DIAGNOSIS — G4730 Sleep apnea, unspecified: Secondary | ICD-10-CM | POA: Diagnosis present

## 2017-10-28 DIAGNOSIS — Z9049 Acquired absence of other specified parts of digestive tract: Secondary | ICD-10-CM

## 2017-10-28 DIAGNOSIS — Z79890 Hormone replacement therapy (postmenopausal): Secondary | ICD-10-CM

## 2017-10-28 DIAGNOSIS — Z888 Allergy status to other drugs, medicaments and biological substances status: Secondary | ICD-10-CM

## 2017-10-28 DIAGNOSIS — J439 Emphysema, unspecified: Secondary | ICD-10-CM | POA: Diagnosis present

## 2017-10-28 DIAGNOSIS — I10 Essential (primary) hypertension: Secondary | ICD-10-CM | POA: Diagnosis not present

## 2017-10-28 DIAGNOSIS — G40909 Epilepsy, unspecified, not intractable, without status epilepticus: Secondary | ICD-10-CM | POA: Diagnosis not present

## 2017-10-28 DIAGNOSIS — E785 Hyperlipidemia, unspecified: Secondary | ICD-10-CM | POA: Diagnosis present

## 2017-10-28 DIAGNOSIS — Z91011 Allergy to milk products: Secondary | ICD-10-CM

## 2017-10-28 DIAGNOSIS — I2510 Atherosclerotic heart disease of native coronary artery without angina pectoris: Secondary | ICD-10-CM | POA: Diagnosis present

## 2017-10-28 DIAGNOSIS — N390 Urinary tract infection, site not specified: Secondary | ICD-10-CM | POA: Diagnosis present

## 2017-10-28 DIAGNOSIS — Z8261 Family history of arthritis: Secondary | ICD-10-CM

## 2017-10-28 DIAGNOSIS — Z9689 Presence of other specified functional implants: Secondary | ICD-10-CM | POA: Diagnosis present

## 2017-10-28 DIAGNOSIS — G894 Chronic pain syndrome: Secondary | ICD-10-CM | POA: Diagnosis present

## 2017-10-28 DIAGNOSIS — F419 Anxiety disorder, unspecified: Secondary | ICD-10-CM | POA: Diagnosis present

## 2017-10-28 DIAGNOSIS — A4159 Other Gram-negative sepsis: Secondary | ICD-10-CM | POA: Diagnosis not present

## 2017-10-28 DIAGNOSIS — A419 Sepsis, unspecified organism: Secondary | ICD-10-CM | POA: Diagnosis present

## 2017-10-28 DIAGNOSIS — Z833 Family history of diabetes mellitus: Secondary | ICD-10-CM

## 2017-10-28 DIAGNOSIS — R911 Solitary pulmonary nodule: Secondary | ICD-10-CM | POA: Diagnosis present

## 2017-10-28 DIAGNOSIS — D508 Other iron deficiency anemias: Secondary | ICD-10-CM

## 2017-10-28 DIAGNOSIS — K449 Diaphragmatic hernia without obstruction or gangrene: Secondary | ICD-10-CM | POA: Diagnosis not present

## 2017-10-28 DIAGNOSIS — J449 Chronic obstructive pulmonary disease, unspecified: Secondary | ICD-10-CM | POA: Diagnosis present

## 2017-10-28 DIAGNOSIS — Z881 Allergy status to other antibiotic agents status: Secondary | ICD-10-CM

## 2017-10-28 DIAGNOSIS — N170 Acute kidney failure with tubular necrosis: Secondary | ICD-10-CM | POA: Diagnosis not present

## 2017-10-28 DIAGNOSIS — F17210 Nicotine dependence, cigarettes, uncomplicated: Secondary | ICD-10-CM | POA: Diagnosis present

## 2017-10-28 DIAGNOSIS — Z885 Allergy status to narcotic agent status: Secondary | ICD-10-CM

## 2017-10-28 DIAGNOSIS — Z79891 Long term (current) use of opiate analgesic: Secondary | ICD-10-CM

## 2017-10-28 DIAGNOSIS — R079 Chest pain, unspecified: Secondary | ICD-10-CM | POA: Diagnosis not present

## 2017-10-28 DIAGNOSIS — Z79899 Other long term (current) drug therapy: Secondary | ICD-10-CM

## 2017-10-28 DIAGNOSIS — Z955 Presence of coronary angioplasty implant and graft: Secondary | ICD-10-CM

## 2017-10-28 DIAGNOSIS — R6521 Severe sepsis with septic shock: Secondary | ICD-10-CM | POA: Diagnosis not present

## 2017-10-28 DIAGNOSIS — Z801 Family history of malignant neoplasm of trachea, bronchus and lung: Secondary | ICD-10-CM

## 2017-10-28 DIAGNOSIS — Z7982 Long term (current) use of aspirin: Secondary | ICD-10-CM

## 2017-10-28 DIAGNOSIS — B372 Candidiasis of skin and nail: Secondary | ICD-10-CM | POA: Diagnosis present

## 2017-10-28 DIAGNOSIS — E039 Hypothyroidism, unspecified: Secondary | ICD-10-CM | POA: Diagnosis not present

## 2017-10-28 DIAGNOSIS — E1136 Type 2 diabetes mellitus with diabetic cataract: Secondary | ICD-10-CM | POA: Diagnosis not present

## 2017-10-28 DIAGNOSIS — Z91041 Radiographic dye allergy status: Secondary | ICD-10-CM

## 2017-10-28 DIAGNOSIS — Z7983 Long term (current) use of bisphosphonates: Secondary | ICD-10-CM

## 2017-10-28 DIAGNOSIS — Z818 Family history of other mental and behavioral disorders: Secondary | ICD-10-CM

## 2017-10-28 LAB — BASIC METABOLIC PANEL
Anion gap: 13 (ref 5–15)
BUN: 30 mg/dL — AB (ref 6–20)
CALCIUM: 8.5 mg/dL — AB (ref 8.9–10.3)
CHLORIDE: 99 mmol/L — AB (ref 101–111)
CO2: 23 mmol/L (ref 22–32)
Creatinine, Ser: 1.83 mg/dL — ABNORMAL HIGH (ref 0.44–1.00)
GFR calc non Af Amer: 28 mL/min — ABNORMAL LOW (ref >60)
GFR, EST AFRICAN AMERICAN: 32 mL/min — AB (ref >60)
Glucose, Bld: 112 mg/dL — ABNORMAL HIGH (ref 65–99)
Potassium: 4.1 mmol/L (ref 3.5–5.1)
SODIUM: 135 mmol/L (ref 135–145)

## 2017-10-28 LAB — URINALYSIS, ROUTINE W REFLEX MICROSCOPIC
Bilirubin Urine: NEGATIVE
Glucose, UA: NEGATIVE mg/dL
Hgb urine dipstick: NEGATIVE
KETONES UR: NEGATIVE mg/dL
Nitrite: POSITIVE — AB
PH: 5 (ref 5.0–8.0)
Protein, ur: NEGATIVE mg/dL
SPECIFIC GRAVITY, URINE: 1.014 (ref 1.005–1.030)
SQUAMOUS EPITHELIAL / LPF: NONE SEEN

## 2017-10-28 LAB — CBC
HCT: 34.8 % — ABNORMAL LOW (ref 36.0–46.0)
Hemoglobin: 9.6 g/dL — ABNORMAL LOW (ref 12.0–15.0)
MCH: 22.7 pg — AB (ref 26.0–34.0)
MCHC: 27.6 g/dL — ABNORMAL LOW (ref 30.0–36.0)
MCV: 82.5 fL (ref 78.0–100.0)
PLATELETS: 334 10*3/uL (ref 150–400)
RBC: 4.22 MIL/uL (ref 3.87–5.11)
RDW: 18.5 % — ABNORMAL HIGH (ref 11.5–15.5)
WBC: 11.7 10*3/uL — ABNORMAL HIGH (ref 4.0–10.5)

## 2017-10-28 LAB — LACTIC ACID, PLASMA
LACTIC ACID, VENOUS: 1.4 mmol/L (ref 0.5–1.9)
LACTIC ACID, VENOUS: 1.3 mmol/L (ref 0.5–1.9)

## 2017-10-28 LAB — TROPONIN I

## 2017-10-28 LAB — BRAIN NATRIURETIC PEPTIDE: B Natriuretic Peptide: 53 pg/mL (ref 0.0–100.0)

## 2017-10-28 MED ORDER — ASPIRIN EC 81 MG PO TBEC
81.0000 mg | DELAYED_RELEASE_TABLET | Freq: Every day | ORAL | Status: DC
  Administered 2017-10-29 – 2017-10-31 (×3): 81 mg via ORAL
  Filled 2017-10-28 (×3): qty 1

## 2017-10-28 MED ORDER — OMEGA-3-ACID ETHYL ESTERS 1 G PO CAPS
1.0000 g | ORAL_CAPSULE | Freq: Every day | ORAL | Status: DC
  Administered 2017-10-29 – 2017-10-31 (×3): 1 g via ORAL
  Filled 2017-10-28 (×3): qty 1

## 2017-10-28 MED ORDER — SODIUM CHLORIDE 0.9 % IV BOLUS
1000.0000 mL | Freq: Once | INTRAVENOUS | Status: AC
  Administered 2017-10-28: 1000 mL via INTRAVENOUS

## 2017-10-28 MED ORDER — SODIUM CHLORIDE 0.9 % IV SOLN
Freq: Once | INTRAVENOUS | Status: AC
  Administered 2017-10-28: 21:00:00 via INTRAVENOUS

## 2017-10-28 MED ORDER — ATORVASTATIN CALCIUM 40 MG PO TABS
40.0000 mg | ORAL_TABLET | Freq: Every day | ORAL | Status: DC
  Administered 2017-10-29 – 2017-10-30 (×2): 40 mg via ORAL
  Filled 2017-10-28 (×2): qty 1

## 2017-10-28 MED ORDER — IPRATROPIUM-ALBUTEROL 0.5-2.5 (3) MG/3ML IN SOLN: 3.0000 mL | Freq: Four times a day (QID) | RESPIRATORY_TRACT | Status: DC | PRN

## 2017-10-28 MED ORDER — ACETAMINOPHEN 650 MG RE SUPP: 650.0000 mg | Freq: Four times a day (QID) | RECTAL | Status: DC | PRN

## 2017-10-28 MED ORDER — SODIUM CHLORIDE 0.9 % IV SOLN
INTRAVENOUS | Status: AC
  Administered 2017-10-28 – 2017-10-29 (×2): via INTRAVENOUS

## 2017-10-28 MED ORDER — MOMETASONE FURO-FORMOTEROL FUM 200-5 MCG/ACT IN AERO
2.0000 | INHALATION_SPRAY | Freq: Two times a day (BID) | RESPIRATORY_TRACT | Status: DC
  Administered 2017-10-29 – 2017-10-31 (×5): 2 via RESPIRATORY_TRACT
  Filled 2017-10-28: qty 8.8

## 2017-10-28 MED ORDER — LEVOTHYROXINE SODIUM 50 MCG PO TABS
50.0000 ug | ORAL_TABLET | Freq: Every day | ORAL | Status: DC
  Administered 2017-10-29 – 2017-10-31 (×3): 50 ug via ORAL
  Filled 2017-10-28: qty 2
  Filled 2017-10-28: qty 1
  Filled 2017-10-28: qty 2

## 2017-10-28 MED ORDER — LACTATED RINGERS IV BOLUS
1000.0000 mL | Freq: Once | INTRAVENOUS | Status: AC
  Administered 2017-10-28: 1000 mL via INTRAVENOUS

## 2017-10-28 MED ORDER — ONDANSETRON HCL 4 MG PO TABS: 4.0000 mg | ORAL_TABLET | Freq: Four times a day (QID) | ORAL | Status: DC | PRN

## 2017-10-28 MED ORDER — LEVETIRACETAM 500 MG PO TABS
750.0000 mg | ORAL_TABLET | Freq: Every day | ORAL | Status: DC
  Administered 2017-10-29 – 2017-10-31 (×3): 750 mg via ORAL
  Filled 2017-10-28 (×3): qty 1

## 2017-10-28 MED ORDER — FAMOTIDINE IN NACL 20-0.9 MG/50ML-% IV SOLN
20.0000 mg | Freq: Two times a day (BID) | INTRAVENOUS | Status: DC
  Administered 2017-10-29 (×3): 20 mg via INTRAVENOUS
  Filled 2017-10-28 (×3): qty 50

## 2017-10-28 MED ORDER — HEPARIN SODIUM (PORCINE) 5000 UNIT/ML IJ SOLN
5000.0000 [IU] | Freq: Three times a day (TID) | INTRAMUSCULAR | Status: DC
  Administered 2017-10-29 – 2017-10-31 (×8): 5000 [IU] via SUBCUTANEOUS
  Filled 2017-10-28 (×8): qty 1

## 2017-10-28 MED ORDER — VITAMIN D 1000 UNITS PO TABS
5000.0000 [IU] | ORAL_TABLET | Freq: Every day | ORAL | Status: DC
  Administered 2017-10-29 – 2017-10-31 (×3): 5000 [IU] via ORAL
  Filled 2017-10-28 (×3): qty 5

## 2017-10-28 MED ORDER — ACETAMINOPHEN 325 MG PO TABS
650.0000 mg | ORAL_TABLET | Freq: Four times a day (QID) | ORAL | Status: DC | PRN
  Filled 2017-10-28: qty 2

## 2017-10-28 MED ORDER — SODIUM CHLORIDE 0.9% FLUSH
3.0000 mL | Freq: Two times a day (BID) | INTRAVENOUS | Status: DC
  Administered 2017-10-29 – 2017-10-31 (×6): 3 mL via INTRAVENOUS

## 2017-10-28 MED ORDER — LEVOFLOXACIN IN D5W 750 MG/150ML IV SOLN
750.0000 mg | Freq: Once | INTRAVENOUS | Status: AC
  Administered 2017-10-28: 750 mg via INTRAVENOUS
  Filled 2017-10-28: qty 150

## 2017-10-28 MED ORDER — FLUCONAZOLE 100 MG PO TABS
200.0000 mg | ORAL_TABLET | Freq: Once | ORAL | Status: AC
Start: 2017-10-28 — End: 2017-10-29
  Administered 2017-10-29: 200 mg via ORAL
  Filled 2017-10-28: qty 2

## 2017-10-28 MED ORDER — DULOXETINE HCL 30 MG PO CPEP
30.0000 mg | ORAL_CAPSULE | Freq: Every day | ORAL | Status: DC
  Administered 2017-10-29 – 2017-10-31 (×3): 30 mg via ORAL
  Filled 2017-10-28 (×3): qty 1

## 2017-10-28 MED ORDER — SODIUM CHLORIDE 0.9 % IV SOLN
2.0000 g | Freq: Once | INTRAVENOUS | Status: AC
  Administered 2017-10-29: 2 g via INTRAVENOUS
  Filled 2017-10-28 (×2): qty 2

## 2017-10-28 MED ORDER — BISACODYL 5 MG PO TBEC: 5.0000 mg | DELAYED_RELEASE_TABLET | Freq: Every day | ORAL | Status: DC | PRN

## 2017-10-28 MED ORDER — HYDROCODONE-ACETAMINOPHEN 5-325 MG PO TABS
1.0000 | ORAL_TABLET | ORAL | Status: DC | PRN
  Administered 2017-10-29: 1 via ORAL
  Administered 2017-10-29 – 2017-10-31 (×6): 2 via ORAL
  Filled 2017-10-28: qty 1
  Filled 2017-10-28 (×6): qty 2

## 2017-10-28 MED ORDER — GABAPENTIN 300 MG PO CAPS
600.0000 mg | ORAL_CAPSULE | Freq: Four times a day (QID) | ORAL | Status: DC
  Administered 2017-10-29 – 2017-10-31 (×10): 600 mg via ORAL
  Filled 2017-10-28 (×8): qty 2
  Filled 2017-10-28: qty 6
  Filled 2017-10-28: qty 2

## 2017-10-28 MED ORDER — MAGNESIUM OXIDE 400 (241.3 MG) MG PO TABS
400.0000 mg | ORAL_TABLET | Freq: Every day | ORAL | Status: DC
  Administered 2017-10-29 – 2017-10-31 (×3): 400 mg via ORAL
  Filled 2017-10-28 (×3): qty 1

## 2017-10-28 MED ORDER — ONDANSETRON HCL 4 MG/2ML IJ SOLN
4.0000 mg | Freq: Four times a day (QID) | INTRAMUSCULAR | Status: DC | PRN
  Administered 2017-10-29: 4 mg via INTRAVENOUS
  Filled 2017-10-28: qty 2

## 2017-10-28 MED ORDER — ADULT MULTIVITAMIN W/MINERALS CH
1.0000 | ORAL_TABLET | Freq: Every day | ORAL | Status: DC
  Administered 2017-10-29 – 2017-10-31 (×3): 1 via ORAL
  Filled 2017-10-28 (×3): qty 1

## 2017-10-28 MED ORDER — NOREPINEPHRINE BITARTRATE 1 MG/ML IV SOLN
0.0000 ug/min | INTRAVENOUS | Status: DC
  Administered 2017-10-29: 2 ug/min via INTRAVENOUS
  Filled 2017-10-28: qty 4

## 2017-10-28 MED ORDER — SENNOSIDES-DOCUSATE SODIUM 8.6-50 MG PO TABS: 1.0000 | ORAL_TABLET | Freq: Every evening | ORAL | Status: DC | PRN

## 2017-10-28 MED ORDER — LEVETIRACETAM 500 MG PO TABS
1000.0000 mg | ORAL_TABLET | Freq: Every day | ORAL | Status: DC
  Administered 2017-10-29 – 2017-10-30 (×3): 1000 mg via ORAL
  Filled 2017-10-28 (×3): qty 2

## 2017-10-28 NOTE — ED Provider Notes (Signed)
Emergency Department Provider Note   I have reviewed the triage vital signs and the nursing notes.   HISTORY  Chief Complaint Chest Pain   HPI Tasha Clarke is a 67 y.o. female with multiple medical problems as diagnosed below the presents to the emergency department today secondary to hypotension and weakness.  The husband provides most the history as the patient is sleepy and not able to participate well in history.  The husband states that she has been more weak over the last 1 to 2 weeks but this specifically over the last few days he has noticed that she has been sniffily more weak and unable to help with ADLs as she normally would be able to do.  Today he was taken her blood pressures and they were going up and down but then they consistently were having's readings of 60 systolic so he brought her here for evaluation.  She is currently being treated for iron deficiency anemia and has had transfusions in the past but most recently she had hemoglobin 9.6 on Monday. No other associated or modifying symptoms.    Past Medical History:  Diagnosis Date  . Allergy   . Anemia   . Anxiety   . Arthritis   . Asthma   . Blood transfusion without reported diagnosis   . Cancer ([place])   . Cataract   . CHF (congestive heart failure) ([place])   . Chronic kidney disease   . Clotting disorder ([place])   . COPD (chronic obstructive pulmonary disease) ([place])   . Depression   . Diabetes mellitus without complication ([place])   . Emphysema of lung ([place])   . GERD (gastroesophageal reflux disease)   . Hyperlipidemia   . Hypertension   . Neuromuscular disorder ([place])   . Osteoporosis   . Oxygen deficiency   . Seizures ([place])   . Sleep apnea 1985   wears cpap nightly  . Thyroid disease     Patient Active Problem List   Diagnosis Date Noted  . AKI (acute kidney injury) ([place]) 10/28/2017  . Hypotension (arterial) 10/28/2017  . Anemia 08/25/2017  . COPD (chronic obstructive pulmonary disease) ([place])  08/24/2017  . Hyperlipidemia LDL goal <100 08/24/2017  . Seizure disorder ([place]) 08/24/2017  . Depression, recurrent ([place]) 08/24/2017  . Osteoporosis 08/24/2017  . Chronic pain syndrome 08/24/2017  . Hypothyroid 08/24/2017  . Hypertension 08/24/2017    Past Surgical History:  Procedure Laterality Date  . APPENDECTOMY    . EYE SURGERY    . FRACTURE SURGERY    . heart stents    . JOINT REPLACEMENT    . SPINAL CORD STIMULATOR IMPLANT  2015  . SPINE SURGERY      Current Outpatient Rx  . Order #: [id number] Class: Historical Med  . Order #: [id number] Class: Historical Med  . Order #: [id number] Class: Historical Med  . Order #: [id number] Class: Historical Med  . Order #: [id number] Class: Normal  . Order #: [id number] Class: Historical Med  . Order #: [id number] Class: Historical Med  . Order #: [id number] Class: Historical Med  . Order #: [id number] Class: Historical Med  . Order #: [id number] Class: Historical Med  . Order #: [id number] Class: Print  . Order #: [id number] Class: Normal  . Order #: [id number] Class: Normal  . Order #: [id number] Class: Historical Med  . Order #: [id number] Class: Normal  . Order #: [id number] Class: Normal  . Order #: [id number] Class: Historical Med  . Order #: [id number] Class: Normal  . Order #: [id number] Class: Normal  . Order #: [id number] Class: Historical Med  .  Order #: [id number] Class: Normal  . Order #: [id number] Class: Historical Med  . Order #: [id number] Class: Historical Med  . Order #: [id number] Class: Historical Med  . Order #: [id number] Class: Historical Med  . Order #: [id number] Class: Historical Med  . Order #: [id number] Class: Historical Med    Allergies Cephalexin; Ketorolac tromethamine; Lac bovis; Imdur  [isosorbide dinitrate]; Iodinated diagnostic agents; Other; Sodium bicarbonate-citric acid; and Milk-related compounds  Family History  Problem Relation Age of Onset  . Arthritis Mother   . Asthma Mother   . Cancer Mother        lung  . Depression  Mother   . Diabetes Mother   . Arthritis Father   . Asthma Father   . Depression Father   . Asthma Sister   . Depression Sister   . Diabetes Sister   . Arthritis Maternal Grandmother   . Asthma Maternal Grandmother   . Depression Maternal Grandmother   . Arthritis Maternal Grandfather   . Asthma Maternal Grandfather   . Arthritis Paternal Grandmother   . Asthma Paternal Grandmother   . Arthritis Paternal Grandfather   . Asthma Paternal Grandfather   . COPD Paternal Grandfather     Social History Social History   Tobacco Use  . Smoking status: Current Every Day Smoker    Packs/day: 0.75    Types: Cigarettes  . Smokeless tobacco: Never Used  Substance Use Topics  . Alcohol use: No    Frequency: Never  . Drug use: No    Review of Systems  All other systems negative except as documented in the HPI. All pertinent positives and negatives as reviewed in the HPI. ____________________________________________   PHYSICAL EXAM:  VITAL SIGNS: ED Triage Vitals  Enc Vitals Group     BP 10/28/17 1535 111/72     Pulse Rate 10/28/17 1535 63     Resp 10/28/17 1535 (!) 24     Temp 10/28/17 1535 97.6 F (36.4 C)     Temp Source 10/28/17 1535 Oral     SpO2 10/28/17 1535 94 %     Weight 10/28/17 1536 222 lb (100.7 kg)     Height --      Head Circumference --      Peak Flow --      Pain Score 10/28/17 1536 9     Pain Loc --      Pain Edu? --      Excl. in [place]? --     Constitutional: Alert and but not able to give history well. Well appearing and in no acute distress. Eyes: Conjunctivae are normal. PERRL. EOMI. Head: Atraumatic. Nose: No congestion/rhinnorhea. Mouth/Throat: Mucous membranes are moist.  Oropharynx non-erythematous. Neck: No stridor.  No meningeal signs.   Cardiovascular: Normal rate, regular rhythm. Good peripheral circulation. Grossly normal heart sounds.   Respiratory: Normal respiratory effort.  No retractions. Lungs with diffuse wheezing and  crackles. Gastrointestinal: Soft and nontender. No distention.  Musculoskeletal: No lower extremity tenderness nor edema. No gross deformities of extremities. Anterior chest ttp Neurologic:  Normal speech and language. No gross focal neurologic deficits are appreciated.  Skin:  Skin is warm, dry and intact. No rash noted.   ____________________________________________   LABS (all labs ordered are listed, but only abnormal results are displayed)  Labs Reviewed  BASIC METABOLIC PANEL - Abnormal; Notable for the following components:      Result Value   Chloride 99 (*)    Glucose, Bld 112 (*)    BUN 30 (*)  Creatinine, Ser 1.83 (*)    Calcium 8.5 (*)    GFR calc non Af Amer 28 (*)    GFR calc Af Amer 32 (*)    All other components within normal limits  CBC - Abnormal; Notable for the following components:   WBC 11.7 (*)    Hemoglobin 9.6 (*)    HCT 34.8 (*)    MCH 22.7 (*)    MCHC 27.6 (*)    RDW 18.5 (*)    All other components within normal limits  URINALYSIS, ROUTINE W REFLEX MICROSCOPIC - Abnormal; Notable for the following components:   APPearance HAZY (*)    Nitrite POSITIVE (*)    Leukocytes, UA MODERATE (*)    Bacteria, UA RARE (*)    All other components within normal limits  TROPONIN I  BRAIN NATRIURETIC PEPTIDE  LACTIC ACID, PLASMA  LACTIC ACID, PLASMA   ____________________________________________  EKG   EKG Interpretation  Date/Time:  Friday October 28 2017 15:36:00 EDT Ventricular Rate:  61 PR Interval:  146 QRS Duration: 80 QT Interval:  456 QTC Calculation: 459 R Axis:   23 Text Interpretation:  Sinus rhythm with occasional Premature ventricular complexes Otherwise normal ECG given previous baseline wander,  No significant change since last tracing Confirmed by Merrily Pew [phone]) on 10/28/2017 7:04:00 PM       ____________________________________________  RADIOLOGY  Ct Chest Wo Contrast  Result Date: 10/28/2017 CLINICAL DATA:  Chest  pressure beginning yesterday with dyspnea. EXAM: CT CHEST WITHOUT CONTRAST TECHNIQUE: Multidetector CT imaging of the chest was performed following the standard protocol without IV contrast. COMPARISON:  Same day CXR FINDINGS: Cardiovascular: Moderate atherosclerosis at the origins of the great vessels with normal branch pattern. Moderate atherosclerosis of the thoracic aorta without aneurysm. Mild dilatation of the main pulmonary artery to 3 cm. Left main and three-vessel coronary arteriosclerosis. Cardiomegaly without pericardial effusion. Mediastinum/Nodes: Mild diffuse thickening of the mid to distal esophagus query esophagitis. Small hiatal hernia. No mediastinal adenopathy. No definite hilar adenopathy though assessment is limited by lack of IV contrast. There is no axillary adenopathy. Trachea and mainstem bronchi are patent. Lungs/Pleura: Mild peribronchial thickening with faint bilateral multilobar ground-glass opacities likely representing areas of alveolitis or pneumonitis versus hypoventilatory change. There is a nodule in the medial right middle lobe measuring approximately 5.2 mm in average, series 4 image 74. No effusion or pneumothorax. There is bibasilar atelectasis. Upper Abdomen: No acute abnormality. Musculoskeletal: ACDF of the included lower cervical spine. Neural stimulator device projects at the midthoracic spine to the T8 level. IMPRESSION: 1. Cardiomegaly with moderate left main and three-vessel coronary arteriosclerosis. 2. Moderate atherosclerosis of the thoracic aorta without aneurysm. 3. Mild peribronchial thickening and ground-glass opacities bilaterally likely reflecting bronchitic change, alveolitis and/or pneumonitis. Hypoventilatory change may also contribute to some of the ground-glass appearance. 4. Mild diffuse thickening of the esophagus query esophagitis. Small hiatal hernia. 5. Nodule in the right middle lobe averaging 5.2 mm. Initial follow-up with CT at 6-12 months is  recommended to confirm persistence. If persistent, repeat CT is recommended every 2 years until 5 years of stability has been established. This recommendation follows the consensus statement: Guidelines for Management of Incidental Pulmonary Nodules Detected on CT Images: From the Fleischner Society 2017; Radiology 2017; 284:228-243. Aortic Atherosclerosis (ICD10-I70.0). Electronically Signed   By: Ashley Royalty M.D.   On: 10/28/2017 20:29   Dg Chest Port 1 View  Result Date: 10/28/2017 CLINICAL DATA:  Chest pain EXAM: PORTABLE CHEST 1 VIEW  COMPARISON:  10/24/2017 FINDINGS: Cardiac shadow remains enlarged. Aortic calcifications are again noted. Postsurgical changes are again seen and stable. The lungs are well aerated bilaterally. No focal infiltrate or sizable effusion is seen. No acute bony abnormality is noted. IMPRESSION: No active disease. Electronically Signed   By: [name] M.D.   On: 10/28/2017 16:51    ____________________________________________   PROCEDURES  Procedure(s) performed:   .Central Line Date/Time: 10/28/2017 10:52 PM Performed by: Merrily Pew, MD Authorized by: Merrily Pew, MD   Consent:    Consent obtained:  Verbal   Consent given by:  Patient and spouse   Risks discussed:  Incorrect placement and arterial puncture   Alternatives discussed:  No treatment Pre-procedure details:    Hand hygiene: Hand hygiene performed prior to insertion     Sterile barrier technique: All elements of maximal sterile technique followed     Skin preparation:  2% chlorhexidine   Skin preparation agent: Skin preparation agent completely dried prior to procedure   Anesthesia (see MAR for exact dosages):    Anesthesia method:  Local infiltration   Local anesthetic:  Lidocaine 1% w/o epi Procedure details:    Location:  R femoral   Site selection rationale:  Patient preference   Patient position:  Flat   Procedural supplies:  Triple lumen   Catheter size:  7.5 Fr   Landmarks  identified: no     Ultrasound guidance: yes     Sterile ultrasound techniques: Sterile gel and sterile probe covers were used     Number of attempts:  1   Successful placement: yes   Post-procedure details:    Post-procedure:  Dressing applied and line sutured   Assessment:  Blood return through all ports and free fluid flow   Patient tolerance of procedure:  Tolerated well, no immediate complications    CRITICAL CARE Performed by: Merrily Pew Total critical care time: 40 minutes Critical care time was exclusive of separately billable procedures and treating other patients. Critical care was necessary to treat or prevent imminent or life-threatening deterioration. Critical care was time spent personally by me on the following activities: development of treatment plan with patient and/or surrogate as well as nursing, discussions with consultants, evaluation of patient's response to treatment, examination of patient, obtaining history from patient or surrogate, ordering and performing treatments and interventions, ordering and review of laboratory studies, ordering and review of radiographic studies, pulse oximetry and re-evaluation of patient's condition.  ____________________________________________   INITIAL IMPRESSION / ASSESSMENT AND PLAN / ED COURSE  Hypotensive. Infectious vs hypovolemia? H/o CHF, but extremities warm, doubt cardiogenic shock. Will workup w/ cxr/urinalysis/labs.   Review of records she apparently has a nodule that was was get a CT scan of her chest.  Of her x-ray here is okay I will likely do a CT scan to evaluate this to make sure is not cancer or symptoms of infection that could be causing her to be sick.  Blood pressures improving on fluids, AKI, normal lactic, normal cxr. Will ct to eval for cause of crackles/wheezing. Continue fluid hydration. In and out for urine.   BP improved and stable after fluids. Still no UOP. Will admit.   On hbospitalist  evaluation, patient once again hypotensive. Will give more fluids, but may end up needing pressors so cvc placed as per procedure note. Patient preferred femoral line however she had some mild yeast infection on both sides.  The right side was selected as it was not as bad and  the line was placed well below the yeast infection.  Pertinent labs & imaging results that were available during my care of the patient were reviewed by me and considered in my medical decision making (see chart for details).  ____________________________________________  FINAL CLINICAL IMPRESSION(S) / ED DIAGNOSES  Final diagnoses:  Hypotension, unspecified hypotension type  Hypovolemia  AKI (acute kidney injury) ([place])     MEDICATIONS GIVEN DURING THIS VISIT:  Medications  lactated ringers bolus 1,000 mL (0 mLs Intravenous Stopped 10/28/17 1852)  lactated ringers bolus 1,000 mL (0 mLs Intravenous Stopped 10/28/17 2044)  0.9 %  sodium chloride infusion ( Intravenous New Bag/Given 10/28/17 2040)     NEW OUTPATIENT MEDICATIONS STARTED DURING THIS VISIT:  New Prescriptions   No medications on file    Note:  This note was prepared with assistance of Dragon voice recognition software. Occasional wrong-word or sound-a-like substitutions may have occurred due to the inherent limitations of voice recognition software.   Merrily Pew, MD 10/28/17 [phone]

## 2017-10-28 NOTE — ED Triage Notes (Addendum)
Pt brought in due to problems with BP. Using manual wrist machine to check BP  States bp has been as low as 60 systolically this morning and BP81/40 prior to arrival. States chest pressure began yesterday and more SOB. Husband states problems with anemia and has been referred to hematologist

## 2017-10-28 NOTE — ED Notes (Signed)
EDP made aware of pt's continued hypotension.

## 2017-10-28 NOTE — ED Notes (Signed)
EDP notified of pt's low BP.

## 2017-10-28 NOTE — H&P (Signed)
History and Physical    Tasha Clarke BTD:[id number] DOB: 09/23/1950 DOA: 10/28/2017  PCP: Dettinger, Fransisca Kaufmann, MD   Patient coming from: Home  Chief Complaint: Low BP, malaise, gen weakness   HPI: Tasha Clarke is a 67 y.o. female with medical history significant for hypertension, COPD, chronic pain, hypothyroidism, seizure disorder, and iron deficiency anemia, now presenting to the emergency department for evaluation of general malaise and low blood pressures.  Patient reports that she has been generally weak and experiencing a nonspecific malaise for the past several days.  She has been monitoring her blood pressure at home and it is been low, but today was worse with SBP in the 60s, persisting that way throughout the day, and eventually prompting her presentation here in the ED.  She denies fevers or chills, denies significant cough, reports mild chronic abdominal pain, reports chronic headaches, but denies change in vision or hearing, or focal numbness or weakness.  Reports that her chronic bilateral lower extremity swelling has gone down.  ED Course: Upon arrival to the ED, patient is found to be afebrile, saturating adequately on room air, blood pressure 69/29, and normal heart rate.  EKG features a sinus rhythm with PVCs and chest x-ray is negative for acute cardiopulmonary disease.  Chemistry panel reveals a BUN of 30 and creatinine 1.83, up from 0.88 last month.  CBC features a new leukocytosis to 11,700 and a stable chronic normocytic anemia with hemoglobin of 9.6.  Troponin is undetectable, BNP is normal, and lactic acid is reassuringly normal.  CT of the chest reveals cardiomegaly, coronary artery disease, mild peribronchial thickening, mild esophageal thickening, and an incidental lung nodule.  Patient was given 2 L of lactated Ringer's in the ED, blood pressure improved initially, but has since come back down.  She will be admitted to the ICU for ongoing evaluation and management of  shock, most likely hypovolemic.  Review of Systems:  All other systems reviewed and apart from HPI, are negative.  Past Medical History:  Diagnosis Date  . Allergy   . Anemia   . Anxiety   . Arthritis   . Asthma   . Blood transfusion without reported diagnosis   . Cancer ([place])   . Cataract   . CHF (congestive heart failure) ([place])   . Chronic kidney disease   . Clotting disorder ([place])   . COPD (chronic obstructive pulmonary disease) ([place])   . Depression   . Diabetes mellitus without complication ([place])   . Emphysema of lung ([place])   . GERD (gastroesophageal reflux disease)   . Hyperlipidemia   . Hypertension   . Neuromuscular disorder ([place])   . Osteoporosis   . Oxygen deficiency   . Seizures ([place])   . Sleep apnea 1985   wears cpap nightly  . Thyroid disease     Past Surgical History:  Procedure Laterality Date  . APPENDECTOMY    . EYE SURGERY    . FRACTURE SURGERY    . heart stents    . JOINT REPLACEMENT    . SPINAL CORD STIMULATOR IMPLANT  2015  . SPINE SURGERY       reports that she has been smoking cigarettes.  She has been smoking about 0.75 packs per day. She has never used smokeless tobacco. She reports that she does not drink alcohol or use drugs.  Allergies  Allergen Reactions  . Cephalexin Hives  . Ketorolac Tromethamine Hives  . Lac Bovis Nausea And Vomiting  . Imdur  [Isosorbide Dinitrate]   .  Iodinated Diagnostic Agents   . Other Other (See Comments)    Alka seltzer causes blurred vision, fainting  . Sodium Bicarbonate-Citric Acid   . Milk-Related Compounds Nausea And Vomiting    Family History  Problem Relation Age of Onset  . Arthritis Mother   . Asthma Mother   . Cancer Mother        lung  . Depression Mother   . Diabetes Mother   . Arthritis Father   . Asthma Father   . Depression Father   . Asthma Sister   . Depression Sister   . Diabetes Sister   . Arthritis Maternal Grandmother   . Asthma Maternal Grandmother   . Depression  Maternal Grandmother   . Arthritis Maternal Grandfather   . Asthma Maternal Grandfather   . Arthritis Paternal Grandmother   . Asthma Paternal Grandmother   . Arthritis Paternal Grandfather   . Asthma Paternal Grandfather   . COPD Paternal Grandfather      Prior to Admission medications   Medication Sig Start Date End Date Taking? Authorizing Provider  albuterol (PROVENTIL HFA;VENTOLIN HFA) 108 (90 Base) MCG/ACT inhaler Inhale 2 puffs into the lungs every 6 (six) hours as needed for wheezing or shortness of breath.   Yes [provider]  alendronate (FOSAMAX) 70 MG tablet Take 70 mg by mouth every Monday. Take with a full glass of water on an empty stomach.    Yes [provider]  aspirin EC 81 MG tablet Take 81 mg by mouth daily.   Yes [provider]  atorvastatin (LIPITOR) 40 MG tablet Take 40 mg by mouth daily.   Yes [provider]  cetirizine (ZYRTEC) 10 MG tablet Take 1 tablet (10 mg total) by mouth daily. 08/31/17  Yes Dettinger, Fransisca Kaufmann, MD  Cholecalciferol (VITAMIN D-3) 5000 units TABS Take 5,000 Units by mouth daily.   Yes [provider]  cyclobenzaprine (FLEXERIL) 10 MG tablet Take 10 mg by mouth 2 (two) times daily as needed for muscle spasms.   Yes [provider]  diphenhydramine-acetaminophen (TYLENOL PM EXTRA STRENGTH) 25-500 MG TABS tablet Take 1 tablet by mouth at bedtime as needed (pain).    Yes [provider]  DULoxetine (CYMBALTA) 30 MG capsule Take 30 mg by mouth daily.   Yes [provider]  EPINEPHrine 0.3 mg/0.3 mL IJ SOAJ injection Inject 0.3 mg into the muscle as needed.   Yes [provider]  Ferrous Sulfate (IRON) 325 (65 Fe) MG TABS Take 1 tablet (325 mg total) by mouth daily. Patient taking differently: Take 68 mg by mouth 2 (two) times daily.  08/27/17  Yes Velvet Bathe, MD  fluticasone (FLONASE) 50 MCG/ACT nasal spray Place 2 sprays into both nostrils daily. 08/31/17  Yes  Dettinger, Fransisca Kaufmann, MD  fluticasone furoate-vilanterol (BREO ELLIPTA) 200-25 MCG/INH AEPB Inhale 1 puff into the lungs daily. 08/31/17  Yes Dettinger, Fransisca Kaufmann, MD  Fluticasone-Salmeterol (ADVAIR) 250-50 MCG/DOSE AEPB Inhale 1 puff into the lungs 2 (two) times daily.   Yes [provider]  furosemide (LASIX) 20 MG tablet Take 1 tablet (20 mg total) by mouth daily as needed. 09/23/17  Yes Dettinger, Fransisca Kaufmann, MD  gabapentin (NEURONTIN) 600 MG tablet Take 1 tablet (600 mg total) by mouth 4 (four) times daily. 10/26/17  Yes Dettinger, Fransisca Kaufmann, MD  ipratropium-albuterol (DUONEB) 0.5-2.5 (3) MG/3ML SOLN Take 3 mLs by nebulization every 6 (six) hours as needed (shortness of breathe).    Yes [provider]  levETIRAcetam (KEPPRA) 1000 MG tablet Take 1 tablet (1,000 mg total) by mouth at bedtime. 08/31/17  Yes Dettinger, Fransisca Kaufmann, MD  levETIRAcetam (KEPPRA) 750 MG tablet Take 1 tablet (750 mg total) by mouth every morning. 08/31/17  Yes Dettinger, Fransisca Kaufmann, MD  levothyroxine (SYNTHROID, LEVOTHROID) 50 MCG tablet Take 50 mcg by mouth daily before breakfast.   Yes [provider]  losartan (COZAAR) 100 MG tablet Take 1/2 tablet daily. Patient taking differently: Take 50 mg by mouth daily. Take 1/2 tablet daily. 09/27/17  Yes Dettinger, Fransisca Kaufmann, MD  Magnesium 400 MG CAPS Take 400 mg by mouth daily.   Yes [provider]  metoprolol tartrate (LOPRESSOR) 25 MG tablet Take 25 mg by mouth 2 (two) times daily.   Yes [provider]  Morphine-Naltrexone 20-0.8 MG CPCR Take 1 capsule by mouth 2 (two) times daily.   Yes [provider]  Multiple Vitamin (MULTIVITAMIN) tablet Take 1 tablet by mouth daily.   Yes [provider]  naloxone (NARCAN) nasal spray 4 mg/0.1 mL Place 1 spray into the nose daily as needed (overdose.).    Yes [provider]  Omega-3 Fatty Acids (FISH OIL) 1000 MG CAPS Take 1,000 mg by mouth daily.   Yes [provider]     Physical Exam: Vitals:   10/28/17 1845 10/28/17 1900 10/28/17 1915 10/28/17 1930  BP: (!) 84/69 (!) 86/50 (!) 81/47 (!) 92/45  Pulse: (!) 52 (!) 52 (!) 53 (!) 52  Resp: 14 12 17 14   Temp:      TempSrc:      SpO2: 99% 99% 99% 93%  Weight:          Constitutional: NAD, calm  Eyes: PERTLA, lids and conjunctivae normal ENMT: Mucous membranes are moist. Posterior pharynx clear of any exudate or lesions.   Neck: normal, supple, no masses, no thyromegaly Respiratory: clear to auscultation bilaterally, occasional wheeze. No accessory muscle use.  Cardiovascular: S1 & S2 heard, regular rate and rhythm. No extremity edema. No significant JVD. Abdomen: No distension, soft, mild generalized tenderness. Bowel sounds actove.  Musculoskeletal: no clubbing / cyanosis. No joint deformity upper and lower extremities.   Skin: no significant rashes, lesions, ulcers. Poor turgor. Neurologic: CN 2-12 grossly intact. Sensation intact. Strength 5/5 in all 4 limbs.  Psychiatric: Alert and oriented x 3. Pleasant and cooperative.     Labs on Admission: I have personally reviewed following labs and imaging studies  CBC: Recent Labs  Lab 10/24/17 0851 10/28/17 1545  WBC 7.0 11.7*  NEUTROABS 3.8  --   HGB 9.7* 9.6*  HCT 33.1* 34.8*  MCV 79 82.5  PLT 342 403   Basic Metabolic Panel: Recent Labs  Lab 10/28/17 1545  NA 135  K 4.1  CL 99*  CO2 23  GLUCOSE 112*  BUN 30*  CREATININE 1.83*  CALCIUM 8.5*   GFR: Estimated Creatinine Clearance: 36 mL/min (A) (by C-G formula based on SCr of 1.83 mg/dL (H)). Liver Function Tests: No results for input(s): AST, ALT, ALKPHOS, BILITOT, PROT, ALBUMIN in the last 168 hours. No results for input(s): LIPASE, AMYLASE in the last 168 hours. No results for input(s): AMMONIA in the last 168 hours. Coagulation Profile: No results for input(s): INR, PROTIME in the last 168 hours. Cardiac Enzymes: Recent Labs  Lab 10/28/17 1545  TROPONINI <0.03    BNP (last 3 results) No results for input(s): PROBNP in the last 8760 hours. HbA1C: No results for input(s): HGBA1C in the last  72 hours. CBG: No results for input(s): GLUCAP in the last 168 hours. Lipid Profile: No results for input(s): CHOL, HDL, LDLCALC, TRIG, CHOLHDL, LDLDIRECT in the last 72 hours. Thyroid Function Tests: No results for input(s): TSH, T4TOTAL, FREET4, T3FREE, THYROIDAB in the last 72 hours. Anemia Panel: No results for input(s): VITAMINB12, FOLATE, FERRITIN, TIBC, IRON, RETICCTPCT in the last 72 hours. Urine analysis:    Component Value Date/Time   COLORURINE YELLOW 10/28/2017 2100   APPEARANCEUR HAZY (A) 10/28/2017 2100   LABSPEC 1.014 10/28/2017 2100   PHURINE 5.0 10/28/2017 2100   GLUCOSEU NEGATIVE 10/28/2017 2100   HGBUR NEGATIVE 10/28/2017 2100   [place] NEGATIVE 10/28/2017 2100   [place] NEGATIVE 10/28/2017 2100   PROTEINUR NEGATIVE 10/28/2017 2100   NITRITE POSITIVE (A) 10/28/2017 2100   LEUKOCYTESUR MODERATE (A) 10/28/2017 2100   Sepsis Labs: @LABRCNTIP (procalcitonin:4,lacticidven:4) )No results found for this or any previous visit (from the past 240 hour(s)).   Radiological Exams on Admission: Ct Chest Wo Contrast  Result Date: 10/28/2017 CLINICAL DATA:  Chest pressure beginning yesterday with dyspnea. EXAM: CT CHEST WITHOUT CONTRAST TECHNIQUE: Multidetector CT imaging of the chest was performed following the standard protocol without IV contrast. COMPARISON:  Same day CXR FINDINGS: Cardiovascular: Moderate atherosclerosis at the origins of the great vessels with normal branch pattern. Moderate atherosclerosis of the thoracic aorta without aneurysm. Mild dilatation of the main pulmonary artery to 3 cm. Left main and three-vessel coronary arteriosclerosis. Cardiomegaly without pericardial effusion. Mediastinum/Nodes: Mild diffuse thickening of the mid to distal esophagus query esophagitis. Small hiatal hernia. No mediastinal adenopathy. No  definite hilar adenopathy though assessment is limited by lack of IV contrast. There is no axillary adenopathy. Trachea and mainstem bronchi are patent. Lungs/Pleura: Mild peribronchial thickening with faint bilateral multilobar ground-glass opacities likely representing areas of alveolitis or pneumonitis versus hypoventilatory change. There is a nodule in the medial right middle lobe measuring approximately 5.2 mm in average, series 4 image 74. No effusion or pneumothorax. There is bibasilar atelectasis. Upper Abdomen: No acute abnormality. Musculoskeletal: ACDF of the included lower cervical spine. Neural stimulator device projects at the midthoracic spine to the T8 level. IMPRESSION: 1. Cardiomegaly with moderate left main and three-vessel coronary arteriosclerosis. 2. Moderate atherosclerosis of the thoracic aorta without aneurysm. 3. Mild peribronchial thickening and ground-glass opacities bilaterally likely reflecting bronchitic change, alveolitis and/or pneumonitis. Hypoventilatory change may also contribute to some of the ground-glass appearance. 4. Mild diffuse thickening of the esophagus query esophagitis. Small hiatal hernia. 5. Nodule in the right middle lobe averaging 5.2 mm. Initial follow-up with CT at 6-12 months is recommended to confirm persistence. If persistent, repeat CT is recommended every 2 years until 5 years of stability has been established. This recommendation follows the consensus statement: Guidelines for Management of Incidental Pulmonary Nodules Detected on CT Images: From the Fleischner Society 2017; Radiology 2017; 284:228-243. Aortic Atherosclerosis (ICD10-I70.0). Electronically Signed   By: Ashley Royalty M.D.   On: 10/28/2017 20:29   Dg Chest Port 1 View  Result Date: 10/28/2017 CLINICAL DATA:  Chest pain EXAM: PORTABLE CHEST 1 VIEW COMPARISON:  10/24/2017 FINDINGS: Cardiac shadow remains enlarged. Aortic calcifications are again noted. Postsurgical changes are again seen and  stable. The lungs are well aerated bilaterally. No focal infiltrate or sizable effusion is seen. No acute bony abnormality is noted. IMPRESSION: No active disease. Electronically Signed   By: [name] M.D.   On: 10/28/2017 16:51    EKG: Independently reviewed. Sinus rhythm, PVC's.   Assessment/Plan  1. Hypotension  - Presents with several days of malaise and low BP at home, worse today with SBP persisting in 60's  - SBP 70's in ED, improved with 2 liters LR, but soon dipped back down to 70's with MAP low-mid 50's  - Suspect this is hypovolemic but infection (UTI) could also be contributing; continued use of antihypertensives also contributing   - Plan to give a 3rd liter IVF now as bolus, continue NS infusion, and ED physician will place central line for pressors if she fails to stabilize with additional IVF    2. UTI  - UA suggests possible infection; no dysuria, but with abd discomfort, leukocytosis, and hypotension, will treat  - Send urine for culture, start Levaquin and Azactam in light of pen allergy   3. Acute kidney injury  - SCr is 1.83 on admission, up from 0.88 last month  - Likely prerenal azotemia in setting of hypotension; ATN possible  - Check urine studies, continue fluid-resuscitation, renally-dose medications, avoid nephrotoxins, repeat chem panel in am    4. Normocytic anemia  - Hgb is 9.6 on admission, similar to priors  - Denies melena or hematochezia  - She will continue iron-supplements and outpatient hematology follow-up   5. Chronic pain  - No pain complaints on admission  - Hold opiates until BP improves, continue Cymbalta   6. Hypothyroidism  - TSH wnl in February   - Continue Synthroid    7. Seizure disorder  - No seizure-like activity   - Continue Keppra   8. COPD  - Occasional wheezing noted, but no dyspnea on admission  - Continue ICS/LABA and prn nebs    DVT prophylaxis: sq heparin  Code Status: Full  Family Communication: Family  updated at bedside Consults called: None Admission status: Inpatient    Vianne Bulls, MD [facility] Pager [phone]  If 7PM-7AM, please contact night-coverage [email or web address] Password [email or web address]  10/28/2017, 10:14 PM

## 2017-10-28 NOTE — ED Notes (Signed)
ED Provider at bedside. 

## 2017-10-29 ENCOUNTER — Other Ambulatory Visit: Payer: Self-pay

## 2017-10-29 DIAGNOSIS — R6521 Severe sepsis with septic shock: Secondary | ICD-10-CM

## 2017-10-29 DIAGNOSIS — A419 Sepsis, unspecified organism: Secondary | ICD-10-CM

## 2017-10-29 LAB — COMPREHENSIVE METABOLIC PANEL
ALT: 13 U/L — ABNORMAL LOW (ref 14–54)
ANION GAP: 9 (ref 5–15)
AST: 19 U/L (ref 15–41)
Albumin: 2.8 g/dL — ABNORMAL LOW (ref 3.5–5.0)
Alkaline Phosphatase: 87 U/L (ref 38–126)
BUN: 24 mg/dL — ABNORMAL HIGH (ref 6–20)
CHLORIDE: 108 mmol/L (ref 101–111)
CO2: 22 mmol/L (ref 22–32)
Calcium: 7.6 mg/dL — ABNORMAL LOW (ref 8.9–10.3)
Creatinine, Ser: 1.05 mg/dL — ABNORMAL HIGH (ref 0.44–1.00)
GFR calc non Af Amer: 55 mL/min — ABNORMAL LOW (ref >60)
Glucose, Bld: 85 mg/dL (ref 65–99)
POTASSIUM: 4.6 mmol/L (ref 3.5–5.1)
SODIUM: 139 mmol/L (ref 135–145)
Total Bilirubin: 0.3 mg/dL (ref 0.3–1.2)
Total Protein: 5.8 g/dL — ABNORMAL LOW (ref 6.5–8.1)

## 2017-10-29 LAB — CBC WITH DIFFERENTIAL/PLATELET
Basophils Absolute: 0 10*3/uL (ref 0.0–0.1)
Basophils Relative: 0 %
Eosinophils Absolute: 0.4 10*3/uL (ref 0.0–0.7)
Eosinophils Relative: 5 %
HEMATOCRIT: 31 % — AB (ref 36.0–46.0)
HEMOGLOBIN: 8.5 g/dL — AB (ref 12.0–15.0)
LYMPHS ABS: 1.9 10*3/uL (ref 0.7–4.0)
Lymphocytes Relative: 22 %
MCH: 23.1 pg — AB (ref 26.0–34.0)
MCHC: 27.4 g/dL — AB (ref 30.0–36.0)
MCV: 84.2 fL (ref 78.0–100.0)
Monocytes Absolute: 0.9 10*3/uL (ref 0.1–1.0)
Monocytes Relative: 11 %
NEUTROS ABS: 5.4 10*3/uL (ref 1.7–7.7)
NEUTROS PCT: 62 %
Platelets: 273 10*3/uL (ref 150–400)
RBC: 3.68 MIL/uL — AB (ref 3.87–5.11)
RDW: 18.6 % — ABNORMAL HIGH (ref 11.5–15.5)
WBC: 8.6 10*3/uL (ref 4.0–10.5)

## 2017-10-29 LAB — TROPONIN I
Troponin I: <0.03 ng/mL (ref <0.03)
Troponin I: <0.03 ng/mL (ref <0.03)

## 2017-10-29 LAB — SODIUM, URINE, RANDOM: SODIUM UR: 50 mmol/L

## 2017-10-29 LAB — GLUCOSE, CAPILLARY: Glucose-Capillary: 91 mg/dL (ref 65–99)

## 2017-10-29 LAB — MRSA PCR SCREENING: MRSA BY PCR: POSITIVE — AB

## 2017-10-29 LAB — CREATININE, URINE, RANDOM: CREATININE, URINE: 94.05 mg/dL

## 2017-10-29 MED ORDER — NOREPINEPHRINE 4 MG/250ML-% IV SOLN
INTRAVENOUS | Status: AC
  Filled 2017-10-29: qty 250

## 2017-10-29 MED ORDER — LEVOFLOXACIN IN D5W 500 MG/100ML IV SOLN
500.0000 mg | INTRAVENOUS | Status: DC
  Administered 2017-10-29: 500 mg via INTRAVENOUS
  Filled 2017-10-29: qty 100

## 2017-10-29 MED ORDER — ORAL CARE MOUTH RINSE
15.0000 mL | Freq: Two times a day (BID) | OROMUCOSAL | Status: DC
  Administered 2017-10-29 – 2017-10-30 (×4): 15 mL via OROMUCOSAL

## 2017-10-29 MED ORDER — MUPIROCIN 2 % EX OINT
1.0000 "application " | TOPICAL_OINTMENT | Freq: Two times a day (BID) | CUTANEOUS | Status: DC
  Administered 2017-10-29 – 2017-10-31 (×5): 1 via NASAL
  Filled 2017-10-29: qty 22

## 2017-10-29 MED ORDER — CHLORHEXIDINE GLUCONATE CLOTH 2 % EX PADS
6.0000 | MEDICATED_PAD | Freq: Every day | CUTANEOUS | Status: DC
  Administered 2017-10-29 – 2017-10-31 (×3): 6 via TOPICAL

## 2017-10-29 MED ORDER — SODIUM CHLORIDE 0.9 % IV BOLUS
1000.0000 mL | Freq: Once | INTRAVENOUS | Status: AC
  Administered 2017-10-29: 1000 mL via INTRAVENOUS

## 2017-10-29 NOTE — Progress Notes (Signed)
PROGRESS NOTE    Tasha Clarke  LAG:[id number] DOB: 1950/11/03 DOA: 10/28/2017 PCP: Dettinger, Fransisca Kaufmann, MD     Brief Narrative:  67 year old woman admitted from home on 4/19 due to low blood pressure and generalized weakness.  Husband has noted low blood pressures for the past 2-3 days, however on day of admission systolic blood pressure was in the 60s prompting her presentation to the emergency department.  She has been feeling dizzy and fainting.  She has a past medical history significant for hypertension and has continue taking her chronic antihypertensive agents despite low blood pressures at home, COPD, hypothyroidism, seizure disorder as well as iron deficiency anemia.  Admission was requested.  She was found to have a UTI.   Assessment & Plan:   Principal Problem:   Severe sepsis with septic shock (HCC) Active Problems:   Acute lower UTI   COPD (chronic obstructive pulmonary disease) (HCC)   Seizure disorder (HCC)   Depression, recurrent (HCC)   Chronic pain syndrome   Hypothyroid   Iron deficiency anemia   AKI (acute kidney injury) ([place])   Severe sepsis with septic shock -Due to UTI. -She is currently pressor dependent on levophed received via femoral central line. -We will give bolus of IV fluids today and try to wean off pressors. -Continue Levaquin, will DC aztreonam. -Await urine and blood culture data.  Acute renal failure -Baseline creatinine around 0.88, was 1.83 on admission down to 1.05 today after IV fluid administration -Acute renal failure secondary to prerenal azotemia and ATN in the face of severe sepsis with shock.  Iron deficiency anemia -Hemoglobin around the low 9 range at baseline, 8.5 today, likely due to dilution effect.   -Recent iron studies show ferritin of 11 which is quite consistent with iron deficiency anemia. -Once off pressors we will give dose of IV iron and continue ferrous sulfate orally.  Next  Hypothyroidism -Continue  Synthroid  Seizure disorder -On Keppra, so far no seizure activity while hospitalized.  COPD -Compensated -Continue ICS/L ABA, as needed nebs.  Depression -Continue Cymbalta  Hyperlipidemia -Continue statin  History of hypertension -Now with septic shock, bowel home antihypertensive agents are being held which include metoprolol, losartan, Lasix.   DVT prophylaxis: Subcutaneous heparin Code Status: Full code Family Communication: Husband at bedside updated on plan of care and all questions answered Disposition Plan: Keep in ICU today, attempt to wean off pressors  Consultants:   None  Procedures:   None  Antimicrobials:  Anti-infectives (From admission, onward)   Start     Dose/Rate Route Frequency Ordered Stop   10/29/17 2200  levofloxacin (LEVAQUIN) IVPB 500 mg     500 mg 100 mL/hr over 60 Minutes Intravenous Every 24 hours 10/29/17 0733     10/29/17 0000  aztreonam (AZACTAM) 2 g in sodium chloride 0.9 % 100 mL IVPB     2 g 200 mL/hr over 30 Minutes Intravenous  Once 10/28/17 2214 10/29/17 0114   10/28/17 2300  fluconazole (DIFLUCAN) tablet 200 mg     200 mg Oral  Once 10/28/17 2254 10/29/17 0040   10/28/17 2215  levofloxacin (LEVAQUIN) IVPB 750 mg     750 mg 100 mL/hr over 90 Minutes Intravenous  Once 10/28/17 2214 10/29/17 0027       Subjective: Lying in bed, states she feels a little better, still weak.  Objective: Vitals:   10/29/17 0615 10/29/17 0630 10/29/17 0645 10/29/17 0755  BP: (!) 89/51 (!) 84/68 109/72   Pulse: 65 64  66   Resp: 15 14 11    Temp:    99.5 F (37.5 C)  TempSrc:      SpO2: 99% 98% 99%   Weight:      Height:        Intake/Output Summary (Last 24 hours) at 10/29/2017 0942 Last data filed at 10/29/2017 0160 Gross per 24 hour  Intake 1935.42 ml  Output 1000 ml  Net 935.42 ml   Filed Weights   10/28/17 1536 10/29/17 0007 10/29/17 0500  Weight: 100.7 kg (222 lb) 101 kg (222 lb 10.6 oz) 101 kg (222 lb 10.6 oz)     Examination:  General exam: Alert, awake, oriented x 3, drowsy, morbidly obese Respiratory system: Clear to auscultation. Respiratory effort normal. Cardiovascular system:RRR. No murmurs, rubs, gallops. Gastrointestinal system: Abdomen is nondistended, soft and nontender. No organomegaly or masses felt. Normal bowel sounds heard. Central nervous system: Alert and oriented. No focal neurological deficits. Extremities: No C/C/E, +pedal pulses Skin: No rashes, lesions or ulcers Psychiatry: Judgement and insight appear normal. Mood & affect appropriate.     Data Reviewed: I have personally reviewed following labs and imaging studies  CBC: Recent Labs  Lab 10/24/17 0851 10/28/17 1545 10/29/17 0413  WBC 7.0 11.7* 8.6  NEUTROABS 3.8  --  5.4  HGB 9.7* 9.6* 8.5*  HCT 33.1* 34.8* 31.0*  MCV 79 82.5 84.2  PLT 342 334 109   Basic Metabolic Panel: Recent Labs  Lab 10/28/17 1545 10/29/17 0413  NA 135 139  K 4.1 4.6  CL 99* 108  CO2 23 22  GLUCOSE 112* 85  BUN 30* 24*  CREATININE 1.83* 1.05*  CALCIUM 8.5* 7.6*   GFR: Estimated Creatinine Clearance: 62.9 mL/min (A) (by C-G formula based on SCr of 1.05 mg/dL (H)). Liver Function Tests: Recent Labs  Lab 10/29/17 0413  AST 19  ALT 13*  ALKPHOS 87  BILITOT 0.3  PROT 5.8*  ALBUMIN 2.8*   No results for input(s): LIPASE, AMYLASE in the last 168 hours. No results for input(s): AMMONIA in the last 168 hours. Coagulation Profile: No results for input(s): INR, PROTIME in the last 168 hours. Cardiac Enzymes: Recent Labs  Lab 10/28/17 1545 10/28/17 2300 10/29/17 0413  TROPONINI <0.03 <0.03 <0.03   BNP (last 3 results) No results for input(s): PROBNP in the last 8760 hours. HbA1C: No results for input(s): HGBA1C in the last 72 hours. CBG: Recent Labs  Lab 10/29/17 0743  GLUCAP 91   Lipid Profile: No results for input(s): CHOL, HDL, LDLCALC, TRIG, CHOLHDL, LDLDIRECT in the last 72 hours. Thyroid Function  Tests: No results for input(s): TSH, T4TOTAL, FREET4, T3FREE, THYROIDAB in the last 72 hours. Anemia Panel: No results for input(s): VITAMINB12, FOLATE, FERRITIN, TIBC, IRON, RETICCTPCT in the last 72 hours. Urine analysis:    Component Value Date/Time   COLORURINE YELLOW 10/28/2017 2100   APPEARANCEUR HAZY (A) 10/28/2017 2100   LABSPEC 1.014 10/28/2017 2100   PHURINE 5.0 10/28/2017 2100   GLUCOSEU NEGATIVE 10/28/2017 2100   HGBUR NEGATIVE 10/28/2017 2100   [place] NEGATIVE 10/28/2017 2100   [place] NEGATIVE 10/28/2017 2100   PROTEINUR NEGATIVE 10/28/2017 2100   NITRITE POSITIVE (A) 10/28/2017 2100   LEUKOCYTESUR MODERATE (A) 10/28/2017 2100   Sepsis Labs: @LABRCNTIP (procalcitonin:4,lacticidven:4)  ) Recent Results (from the past 240 hour(s))  Culture, blood (x 2)     Status: None (Preliminary result)   Collection Time: 10/28/17 11:00 PM  Result Value Ref Range Status   Specimen Description A-LINE  Final  Special Requests   Final    BOTTLES DRAWN AEROBIC AND ANAEROBIC Blood Culture adequate volume DRAWN BY RN   Culture   Final    NO GROWTH < 12 HOURS Performed at [facility], [address] [place], [place] [address]    Report Status PENDING  Incomplete  Culture, blood (x 2)     Status: None (Preliminary result)   Collection Time: 10/28/17 11:19 PM  Result Value Ref Range Status   Specimen Description BLOOD LEFT WRIST  Final   Special Requests   Final    BOTTLES DRAWN AEROBIC ONLY Blood Culture adequate volume   Culture   Final    NO GROWTH < 12 HOURS Performed at [facility], [address] [place], [place] [address]    Report Status PENDING  Incomplete  MRSA PCR Screening     Status: Abnormal   Collection Time: 10/28/17 11:50 PM  Result Value Ref Range Status   MRSA by PCR POSITIVE (A) NEGATIVE Final    Comment:        The GeneXpert MRSA Assay (FDA approved for NASAL specimens only), is one component of a comprehensive MRSA colonization surveillance  program. It is not intended to diagnose MRSA infection nor to guide or monitor treatment for MRSA infections. RESULT CALLED TO, READ BACK BY AND VERIFIED WITH: GAMMON,S@0630  BY MATTHEWS, B 4.20.19 Performed at [facility], [address] [place],  [address]          Radiology Studies: Ct Chest Wo Contrast  Result Date: 10/28/2017 CLINICAL DATA:  Chest pressure beginning yesterday with dyspnea. EXAM: CT CHEST WITHOUT CONTRAST TECHNIQUE: Multidetector CT imaging of the chest was performed following the standard protocol without IV contrast. COMPARISON:  Same day CXR FINDINGS: Cardiovascular: Moderate atherosclerosis at the origins of the great vessels with normal branch pattern. Moderate atherosclerosis of the thoracic aorta without aneurysm. Mild dilatation of the main pulmonary artery to 3 cm. Left main and three-vessel coronary arteriosclerosis. Cardiomegaly without pericardial effusion. Mediastinum/Nodes: Mild diffuse thickening of the mid to distal esophagus query esophagitis. Small hiatal hernia. No mediastinal adenopathy. No definite hilar adenopathy though assessment is limited by lack of IV contrast. There is no axillary adenopathy. Trachea and mainstem bronchi are patent. Lungs/Pleura: Mild peribronchial thickening with faint bilateral multilobar ground-glass opacities likely representing areas of alveolitis or pneumonitis versus hypoventilatory change. There is a nodule in the medial right middle lobe measuring approximately 5.2 mm in average, series 4 image 74. No effusion or pneumothorax. There is bibasilar atelectasis. Upper Abdomen: No acute abnormality. Musculoskeletal: ACDF of the included lower cervical spine. Neural stimulator device projects at the midthoracic spine to the T8 level. IMPRESSION: 1. Cardiomegaly with moderate left main and three-vessel coronary arteriosclerosis. 2. Moderate atherosclerosis of the thoracic aorta without aneurysm. 3. Mild peribronchial  thickening and ground-glass opacities bilaterally likely reflecting bronchitic change, alveolitis and/or pneumonitis. Hypoventilatory change may also contribute to some of the ground-glass appearance. 4. Mild diffuse thickening of the esophagus query esophagitis. Small hiatal hernia. 5. Nodule in the right middle lobe averaging 5.2 mm. Initial follow-up with CT at 6-12 months is recommended to confirm persistence. If persistent, repeat CT is recommended every 2 years until 5 years of stability has been established. This recommendation follows the consensus statement: Guidelines for Management of Incidental Pulmonary Nodules Detected on CT Images: From the Fleischner Society 2017; Radiology 2017; 284:228-243. Aortic Atherosclerosis (ICD10-I70.0). Electronically Signed   By: Ashley Royalty M.D.   On: 10/28/2017 20:29   Dg  Chest Port 1 View  Result Date: 10/28/2017 CLINICAL DATA:  Chest pain EXAM: PORTABLE CHEST 1 VIEW COMPARISON:  10/24/2017 FINDINGS: Cardiac shadow remains enlarged. Aortic calcifications are again noted. Postsurgical changes are again seen and stable. The lungs are well aerated bilaterally. No focal infiltrate or sizable effusion is seen. No acute bony abnormality is noted. IMPRESSION: No active disease. Electronically Signed   By: [name] M.D.   On: 10/28/2017 16:51        Scheduled Meds: . aspirin EC  81 mg Oral Daily  . atorvastatin  40 mg Oral q1800  . Chlorhexidine Gluconate Cloth  6 each Topical Q0600  . cholecalciferol  5,000 Units Oral Daily  . DULoxetine  30 mg Oral Daily  . gabapentin  600 mg Oral QID  . heparin  5,000 Units Subcutaneous Q8H  . levETIRAcetam  1,000 mg Oral QHS  . levETIRAcetam  750 mg Oral Daily  . levothyroxine  50 mcg Oral QAC breakfast  . magnesium oxide  400 mg Oral Daily  . mouth rinse  15 mL Mouth Rinse BID  . mometasone-formoterol  2 puff Inhalation BID  . multivitamin with minerals  1 tablet Oral Daily  . mupirocin ointment  1 application  Nasal BID  . omega-3 acid ethyl esters  1 g Oral Daily  . sodium chloride flush  3 mL Intravenous Q12H   Continuous Infusions: . sodium chloride 125 mL/hr at 10/29/17 0408  . famotidine (PEPCID) IV Stopped (10/29/17 0119)  . levofloxacin (LEVAQUIN) IV    . norepinephrine (LEVOPHED) Adult infusion Stopped (10/29/17 0800)  . sodium chloride       LOS: 1 day   Critical care time spent: 45 minutes. Greater than 50% of this time was spent in direct contact with this critically ill patient and patient's husband, coordinating care and discussing relevant ongoing clinical issues, including UTI, pressor dependent septic shock and treatment plan.Lelon Frohlich, MD [facility] Pager [phone]  If 7PM-7AM, please contact night-coverage [email or web address] Password [facility] 10/29/2017, 9:42 AM

## 2017-10-29 NOTE — Progress Notes (Signed)
Pharmacy Antibiotic Note  Tasha Clarke is a 67 y.o. female admitted on 10/28/2017 with UTI.  Pharmacy has been consulted for Levaquin dosing.  Plan: Levaquin 750mg  x 1 then 500mg  IV q24hrs Monitor labs, progress, c/s  Height: 5\' 5"  (165.1 cm) Weight: 222 lb 10.6 oz (101 kg) IBW/kg (Calculated) : 57  Temp (24hrs), Avg:98.4 F (36.9 C), Min:97.6 F (36.4 C), Max:99.5 F (37.5 C)  Recent Labs  Lab 10/24/17 0851 10/28/17 1545 10/28/17 1657 10/28/17 1941 10/29/17 0413  WBC 7.0 11.7*  --   --  8.6  CREATININE  --  1.83*  --   --  1.05*  LATICACIDVEN  --   --  1.4 1.3  --     Estimated Creatinine Clearance: 62.9 mL/min (A) (by C-G formula based on SCr of 1.05 mg/dL (H)).    Allergies  Allergen Reactions  . Cephalexin Hives  . Ketorolac Tromethamine Hives  . Lac Bovis Nausea And Vomiting  . Imdur  [Isosorbide Dinitrate]   . Iodinated Diagnostic Agents   . Other Other (See Comments)    Alka seltzer causes blurred vision, fainting  . Sodium Bicarbonate-Citric Acid   . Milk-Related Compounds Nausea And Vomiting   Antimicrobials this admission: Levaquin 4/19 >>  Aztreonam 4/19 x 1  Dose adjustments this admission:  Microbiology results: 4/19 BCx: pending 4/19 UCx: pending 4/19 MRSA PCR: positive  Thank you for allowing pharmacy to be a part of this patient's care.  Hart Robinsons A 10/29/2017 10:11 AM

## 2017-10-29 NOTE — Progress Notes (Signed)
Turned off Levophed for about 15/12minutes to see how patient tolerated, her BP dropped to the 40H systolic; Levophed resumed at 67mcg and pressure back up. Will continue to monitor.  Patient unable to tolerate BP on her upper arm; she requests forearm BP only.

## 2017-10-30 LAB — BASIC METABOLIC PANEL
Anion gap: 7 (ref 5–15)
BUN: 14 mg/dL (ref 6–20)
CO2: 22 mmol/L (ref 22–32)
Calcium: 8.5 mg/dL — ABNORMAL LOW (ref 8.9–10.3)
Chloride: 109 mmol/L (ref 101–111)
Creatinine, Ser: 0.54 mg/dL (ref 0.44–1.00)
GFR calc non Af Amer: >60 mL/min (ref >60)
Glucose, Bld: 89 mg/dL (ref 65–99)
Potassium: 5 mmol/L (ref 3.5–5.1)
SODIUM: 138 mmol/L (ref 135–145)

## 2017-10-30 LAB — CBC
HCT: 29.7 % — ABNORMAL LOW (ref 36.0–46.0)
Hemoglobin: 8.2 g/dL — ABNORMAL LOW (ref 12.0–15.0)
MCH: 23.2 pg — AB (ref 26.0–34.0)
MCHC: 27.6 g/dL — ABNORMAL LOW (ref 30.0–36.0)
MCV: 83.9 fL (ref 78.0–100.0)
PLATELETS: 260 10*3/uL (ref 150–400)
RBC: 3.54 MIL/uL — AB (ref 3.87–5.11)
RDW: 18 % — ABNORMAL HIGH (ref 11.5–15.5)
WBC: 6 10*3/uL (ref 4.0–10.5)

## 2017-10-30 LAB — UREA NITROGEN, URINE: Urea Nitrogen, Ur: 180 mg/dL

## 2017-10-30 LAB — GLUCOSE, CAPILLARY: Glucose-Capillary: 101 mg/dL — ABNORMAL HIGH (ref 65–99)

## 2017-10-30 MED ORDER — LEVOFLOXACIN 500 MG PO TABS
500.0000 mg | ORAL_TABLET | Freq: Every day | ORAL | Status: DC
  Administered 2017-10-30 – 2017-10-31 (×2): 500 mg via ORAL
  Filled 2017-10-30 (×2): qty 1

## 2017-10-30 MED ORDER — SODIUM CHLORIDE 0.9 % IV BOLUS: 500.0000 mL | Freq: Once | INTRAVENOUS | Status: DC

## 2017-10-30 MED ORDER — FAMOTIDINE 20 MG PO TABS
20.0000 mg | ORAL_TABLET | Freq: Two times a day (BID) | ORAL | Status: DC
  Administered 2017-10-30 – 2017-10-31 (×3): 20 mg via ORAL
  Filled 2017-10-30 (×3): qty 1

## 2017-10-30 MED ORDER — SODIUM CHLORIDE 0.9 % IV SOLN
510.0000 mg | Freq: Once | INTRAVENOUS | Status: AC
  Administered 2017-10-30: 510 mg via INTRAVENOUS
  Filled 2017-10-30: qty 17

## 2017-10-30 NOTE — Progress Notes (Signed)
Pharmacy Antibiotic Note  Tasha Clarke is a 67 y.o. female admitted on 10/28/2017 with UTI.  Pharmacy has been consulted for Levaquin dosing.  SCr has improved.   Plan: Levaquin 500mg  PO q24hrs x 5 days total Monitor labs, progress, c/s Pharmacy will sign off.  Height: 5\' 5"  (165.1 cm) Weight: 225 lb 1.4 oz (102.1 kg) IBW/kg (Calculated) : 57  Temp (24hrs), Avg:98.5 F (36.9 C), Min:98 F (36.7 C), Max:99 F (37.2 C)  Recent Labs  Lab 10/24/17 0851 10/28/17 1545 10/28/17 1657 10/28/17 1941 10/29/17 0413 10/30/17 0444  WBC 7.0 11.7*  --   --  8.6 6.0  CREATININE  --  1.83*  --   --  1.05* 0.54  LATICACIDVEN  --   --  1.4 1.3  --   --     Estimated Creatinine Clearance: 83 mL/min (by C-G formula based on SCr of 0.54 mg/dL).    Allergies  Allergen Reactions  . Cephalexin Hives  . Ketorolac Tromethamine Hives  . Lac Bovis Nausea And Vomiting  . Imdur  [Isosorbide Dinitrate]   . Iodinated Diagnostic Agents   . Other Other (See Comments)    Alka seltzer causes blurred vision, fainting  . Sodium Bicarbonate-Citric Acid   . Milk-Related Compounds Nausea And Vomiting   Antimicrobials this admission: Levaquin 4/19 >> 4/24 Aztreonam 4/19 x 1  Dose adjustments this admission:  Microbiology results: 4/19 BCx: pending 4/19 UCx: pending 4/19 MRSA PCR: positive  Thank you for allowing pharmacy to be a part of this patient's care.  Hart Robinsons A 10/30/2017 9:00 AM

## 2017-10-30 NOTE — Progress Notes (Signed)
The patient is receiving PEPCID by the intravenous route.  Based on criteria approved by the Pharmacy and [place], the medication is being converted to the equivalent oral dose form.  These criteria include: -No Active GI bleeding -Able to tolerate diet of full liquids (or better) or tube feeding OR able to tolerate other medications by the oral or enteral route  If you have any questions about this conversion, please contact the Pharmacy Department (ext [phone]).  Thank you.  Ena Dawley, [facility] 10/30/2017 9:03 AM

## 2017-10-30 NOTE — Progress Notes (Signed)
PROGRESS NOTE    Tasha Clarke  XTK:[id number] DOB: 1951-02-10 DOA: 10/28/2017 PCP: Dettinger, Fransisca Kaufmann, MD     Brief Narrative:  67 year old woman admitted from home on 4/19 due to low blood pressure and generalized weakness.  Husband has noted low blood pressures for the past 2-3 days, however on day of admission systolic blood pressure was in the 60s prompting her presentation to the emergency department.  She has been feeling dizzy and fainting.  She has a past medical history significant for hypertension and has continue taking her chronic antihypertensive agents despite low blood pressures at home, COPD, hypothyroidism, seizure disorder as well as iron deficiency anemia.  Admission was requested.  She was found to have a UTI.   Assessment & Plan:   Principal Problem:   Severe sepsis with septic shock (HCC) Active Problems:   Acute lower UTI   COPD (chronic obstructive pulmonary disease) (HCC)   Seizure disorder (HCC)   Depression, recurrent (HCC)   Chronic pain syndrome   Hypothyroid   Iron deficiency anemia   AKI (acute kidney injury) ([place])   Severe sepsis with septic shock -Due to UTI. -We were able to wean off pressors afternoon of 4/20. -Since off pressors for 24 hours and has good peripheral IV access, will DC femoral central line. -Continue Levaquin, while we await urine and blood culture data.  Acute renal failure -Resolved. Baseline creatinine around 0.88, was 1.83 on admission down to 0.54 on 4/21 today after IV fluid administration -Acute renal failure secondary to prerenal azotemia and ATN in the face of severe sepsis with shock.  Iron deficiency anemia -Hemoglobin around the low 9 range at baseline, 8.2 today, likely due to dilution effect.   -Recent iron studies show ferritin of 11 which is quite consistent with iron deficiency anemia. -Give ferraheme today and continue PO iron supplementation in am.  Hypothyroidism -Continue Synthroid  Seizure  disorder -On Keppra, so far no seizure activity while hospitalized.  COPD -Compensated -Continue ICS/L ABA, as needed nebs.  Depression -Continue Cymbalta  Hyperlipidemia -Continue statin  History of hypertension -Presented in septic shock. -Off pressors for close to 24 hours. -If BP remains elevated in am, consider reinitiating some of her home antihypertensive agents.   DVT prophylaxis: Subcutaneous heparin Code Status: Full code Family Communication: Patient only Disposition Plan: Transfer to floor.  Consultants:   None  Procedures:   None  Antimicrobials:  Anti-infectives (From admission, onward)   Start     Dose/Rate Route Frequency Ordered Stop   10/30/17 1800  levofloxacin (LEVAQUIN) tablet 500 mg     500 mg Oral Daily 10/30/17 0900 11/03/17 0959   10/29/17 2200  levofloxacin (LEVAQUIN) IVPB 500 mg  Status:  Discontinued     500 mg 100 mL/hr over 60 Minutes Intravenous Every 24 hours 10/29/17 0733 10/30/17 0900   10/29/17 0000  aztreonam (AZACTAM) 2 g in sodium chloride 0.9 % 100 mL IVPB     2 g 200 mL/hr over 30 Minutes Intravenous  Once 10/28/17 2214 10/29/17 0114   10/28/17 2300  fluconazole (DIFLUCAN) tablet 200 mg     200 mg Oral  Once 10/28/17 2254 10/29/17 0040   10/28/17 2215  levofloxacin (LEVAQUIN) IVPB 750 mg     750 mg 100 mL/hr over 90 Minutes Intravenous  Once 10/28/17 2214 10/29/17 0027       Subjective: Lying in bed, sleeping, no complaints other than weakness.  Objective: Vitals:   10/30/17 0302 10/30/17 0400 10/30/17 0500 10/30/17  0800  BP: (!) 87/64     Pulse: 66     Resp: 13     Temp:  98.6 F (37 C)  98 F (36.7 C)  TempSrc:  Oral  Oral  SpO2: 95%     Weight:   102.1 kg (225 lb 1.4 oz)   Height:        Intake/Output Summary (Last 24 hours) at 10/30/2017 0959 Last data filed at 10/30/2017 0650 Gross per 24 hour  Intake 62 ml  Output 2550 ml  Net -2488 ml   Filed Weights   10/29/17 0007 10/29/17 0500 10/30/17 0500    Weight: 101 kg (222 lb 10.6 oz) 101 kg (222 lb 10.6 oz) 102.1 kg (225 lb 1.4 oz)    Examination:  General exam: Alert, awake, oriented x 3, sleepy Respiratory system: Clear to auscultation. Respiratory effort normal. Cardiovascular system:RRR. No murmurs, rubs, gallops. Gastrointestinal system: Abdomen is nondistended, soft and nontender. No organomegaly or masses felt. Normal bowel sounds heard. Central nervous system: Alert and oriented. No focal neurological deficits. Extremities: No C/C/E, +pedal pulses Skin: No rashes, lesions or ulcers Psychiatry: Judgement and insight appear normal. Mood & affect appropriate.      Data Reviewed: I have personally reviewed following labs and imaging studies  CBC: Recent Labs  Lab 10/24/17 0851 10/28/17 1545 10/29/17 0413 10/30/17 0444  WBC 7.0 11.7* 8.6 6.0  NEUTROABS 3.8  --  5.4  --   HGB 9.7* 9.6* 8.5* 8.2*  HCT 33.1* 34.8* 31.0* 29.7*  MCV 79 82.5 84.2 83.9  PLT 342 334 273 824   Basic Metabolic Panel: Recent Labs  Lab 10/28/17 1545 10/29/17 0413 10/30/17 0444  NA 135 139 138  K 4.1 4.6 5.0  CL 99* 108 109  CO2 23 22 22   GLUCOSE 112* 85 89  BUN 30* 24* 14  CREATININE 1.83* 1.05* 0.54  CALCIUM 8.5* 7.6* 8.5*   GFR: Estimated Creatinine Clearance: 83 mL/min (by C-G formula based on SCr of 0.54 mg/dL). Liver Function Tests: Recent Labs  Lab 10/29/17 0413  AST 19  ALT 13*  ALKPHOS 87  BILITOT 0.3  PROT 5.8*  ALBUMIN 2.8*   No results for input(s): LIPASE, AMYLASE in the last 168 hours. No results for input(s): AMMONIA in the last 168 hours. Coagulation Profile: No results for input(s): INR, PROTIME in the last 168 hours. Cardiac Enzymes: Recent Labs  Lab 10/28/17 1545 10/28/17 2300 10/29/17 0413 10/29/17 1014  TROPONINI <0.03 <0.03 <0.03 <0.03   BNP (last 3 results) No results for input(s): PROBNP in the last 8760 hours. HbA1C: No results for input(s): HGBA1C in the last 72 hours. CBG: Recent Labs   Lab 10/29/17 0743 10/30/17 0751  GLUCAP 91 101*   Lipid Profile: No results for input(s): CHOL, HDL, LDLCALC, TRIG, CHOLHDL, LDLDIRECT in the last 72 hours. Thyroid Function Tests: No results for input(s): TSH, T4TOTAL, FREET4, T3FREE, THYROIDAB in the last 72 hours. Anemia Panel: No results for input(s): VITAMINB12, FOLATE, FERRITIN, TIBC, IRON, RETICCTPCT in the last 72 hours. Urine analysis:    Component Value Date/Time   COLORURINE YELLOW 10/28/2017 2100   APPEARANCEUR HAZY (A) 10/28/2017 2100   LABSPEC 1.014 10/28/2017 2100   PHURINE 5.0 10/28/2017 2100   GLUCOSEU NEGATIVE 10/28/2017 2100   HGBUR NEGATIVE 10/28/2017 2100   [place] NEGATIVE 10/28/2017 2100   [place] NEGATIVE 10/28/2017 2100   PROTEINUR NEGATIVE 10/28/2017 2100   NITRITE POSITIVE (A) 10/28/2017 2100   LEUKOCYTESUR MODERATE (A) 10/28/2017 2100  Sepsis Labs: @LABRCNTIP (procalcitonin:4,lacticidven:4)  ) Recent Results (from the past 240 hour(s))  Culture, blood (x 2)     Status: None (Preliminary result)   Collection Time: 10/28/17 11:00 PM  Result Value Ref Range Status   Specimen Description A-LINE  Final   Special Requests   Final    BOTTLES DRAWN AEROBIC AND ANAEROBIC Blood Culture adequate volume DRAWN BY RN   Culture   Final    NO GROWTH 2 DAYS Performed at [facility], [address] [place], [place] [address]    Report Status PENDING  Incomplete  Culture, blood (x 2)     Status: None (Preliminary result)   Collection Time: 10/28/17 11:19 PM  Result Value Ref Range Status   Specimen Description BLOOD LEFT WRIST  Final   Special Requests   Final    BOTTLES DRAWN AEROBIC ONLY Blood Culture adequate volume   Culture   Final    NO GROWTH 2 DAYS Performed at [facility], [address] [place], [place] [address]    Report Status PENDING  Incomplete  MRSA PCR Screening     Status: Abnormal   Collection Time: 10/28/17 11:50 PM  Result Value Ref Range Status   MRSA by PCR POSITIVE (A)  NEGATIVE Final    Comment:        The GeneXpert MRSA Assay (FDA approved for NASAL specimens only), is one component of a comprehensive MRSA colonization surveillance program. It is not intended to diagnose MRSA infection nor to guide or monitor treatment for MRSA infections. RESULT CALLED TO, READ BACK BY AND VERIFIED WITH: GAMMON,S@0630  BY MATTHEWS, B 4.20.19 Performed at [facility], [address], [place], [place] [address]          Radiology Studies: Ct Chest Wo Contrast  Result Date: 10/28/2017 CLINICAL DATA:  Chest pressure beginning yesterday with dyspnea. EXAM: CT CHEST WITHOUT CONTRAST TECHNIQUE: Multidetector CT imaging of the chest was performed following the standard protocol without IV contrast. COMPARISON:  Same day CXR FINDINGS: Cardiovascular: Moderate atherosclerosis at the origins of the great vessels with normal branch pattern. Moderate atherosclerosis of the thoracic aorta without aneurysm. Mild dilatation of the main pulmonary artery to 3 cm. Left main and three-vessel coronary arteriosclerosis. Cardiomegaly without pericardial effusion. Mediastinum/Nodes: Mild diffuse thickening of the mid to distal esophagus query esophagitis. Small hiatal hernia. No mediastinal adenopathy. No definite hilar adenopathy though assessment is limited by lack of IV contrast. There is no axillary adenopathy. Trachea and mainstem bronchi are patent. Lungs/Pleura: Mild peribronchial thickening with faint bilateral multilobar ground-glass opacities likely representing areas of alveolitis or pneumonitis versus hypoventilatory change. There is a nodule in the medial right middle lobe measuring approximately 5.2 mm in average, series 4 image 74. No effusion or pneumothorax. There is bibasilar atelectasis. Upper Abdomen: No acute abnormality. Musculoskeletal: ACDF of the included lower cervical spine. Neural stimulator device projects at the midthoracic spine to the T8 level. IMPRESSION: 1.  Cardiomegaly with moderate left main and three-vessel coronary arteriosclerosis. 2. Moderate atherosclerosis of the thoracic aorta without aneurysm. 3. Mild peribronchial thickening and ground-glass opacities bilaterally likely reflecting bronchitic change, alveolitis and/or pneumonitis. Hypoventilatory change may also contribute to some of the ground-glass appearance. 4. Mild diffuse thickening of the esophagus query esophagitis. Small hiatal hernia. 5. Nodule in the right middle lobe averaging 5.2 mm. Initial follow-up with CT at 6-12 months is recommended to confirm persistence. If persistent, repeat CT is recommended every 2 years until 5 years of stability has been established.  This recommendation follows the consensus statement: Guidelines for Management of Incidental Pulmonary Nodules Detected on CT Images: From the Fleischner Society 2017; Radiology 2017; 284:228-243. Aortic Atherosclerosis (ICD10-I70.0). Electronically Signed   By: Ashley Royalty M.D.   On: 10/28/2017 20:29   Dg Chest Port 1 View  Result Date: 10/28/2017 CLINICAL DATA:  Chest pain EXAM: PORTABLE CHEST 1 VIEW COMPARISON:  10/24/2017 FINDINGS: Cardiac shadow remains enlarged. Aortic calcifications are again noted. Postsurgical changes are again seen and stable. The lungs are well aerated bilaterally. No focal infiltrate or sizable effusion is seen. No acute bony abnormality is noted. IMPRESSION: No active disease. Electronically Signed   By: [name] M.D.   On: 10/28/2017 16:51        Scheduled Meds: . aspirin EC  81 mg Oral Daily  . atorvastatin  40 mg Oral q1800  . Chlorhexidine Gluconate Cloth  6 each Topical Q0600  . cholecalciferol  5,000 Units Oral Daily  . DULoxetine  30 mg Oral Daily  . famotidine  20 mg Oral BID  . gabapentin  600 mg Oral QID  . heparin  5,000 Units Subcutaneous Q8H  . levETIRAcetam  1,000 mg Oral QHS  . levETIRAcetam  750 mg Oral Daily  . levofloxacin  500 mg Oral Daily  . levothyroxine  50  mcg Oral QAC breakfast  . magnesium oxide  400 mg Oral Daily  . mouth rinse  15 mL Mouth Rinse BID  . mometasone-formoterol  2 puff Inhalation BID  . multivitamin with minerals  1 tablet Oral Daily  . mupirocin ointment  1 application Nasal BID  . omega-3 acid ethyl esters  1 g Oral Daily  . sodium chloride flush  3 mL Intravenous Q12H   Continuous Infusions: . norepinephrine (LEVOPHED) Adult infusion Stopped (10/29/17 0800)  . sodium chloride Stopped (10/30/17 0636)     LOS: 2 days   Time spent: 25 minutes.     Lelon Frohlich, MD [facility] Pager [phone]  If 7PM-7AM, please contact night-coverage [email or web address] Password [facility] 10/30/2017, 9:59 AM

## 2017-10-30 NOTE — Progress Notes (Signed)
Has been alert and oriented since transfer from CCU.  Right femoral line site dressing dry and intact since  line pulled earlier by [facility] from CCU.

## 2017-10-31 ENCOUNTER — Telehealth: Payer: Self-pay | Admitting: *Deleted

## 2017-10-31 LAB — URINE CULTURE

## 2017-10-31 LAB — CBC
HCT: 31.7 % — ABNORMAL LOW (ref 36.0–46.0)
HEMOGLOBIN: 9 g/dL — AB (ref 12.0–15.0)
MCH: 23.5 pg — ABNORMAL LOW (ref 26.0–34.0)
MCHC: 28.4 g/dL — AB (ref 30.0–36.0)
MCV: 82.8 fL (ref 78.0–100.0)
Platelets: 254 10*3/uL (ref 150–400)
RBC: 3.83 MIL/uL — ABNORMAL LOW (ref 3.87–5.11)
RDW: 17.2 % — AB (ref 11.5–15.5)
WBC: 5.7 10*3/uL (ref 4.0–10.5)

## 2017-10-31 LAB — BASIC METABOLIC PANEL
ANION GAP: 8 (ref 5–15)
BUN: 9 mg/dL (ref 6–20)
CALCIUM: 9.5 mg/dL (ref 8.9–10.3)
CO2: 27 mmol/L (ref 22–32)
Chloride: 103 mmol/L (ref 101–111)
Creatinine, Ser: 0.55 mg/dL (ref 0.44–1.00)
GFR calc Af Amer: >60 mL/min (ref >60)
GLUCOSE: 99 mg/dL (ref 65–99)
Potassium: 4.7 mmol/L (ref 3.5–5.1)
SODIUM: 138 mmol/L (ref 135–145)

## 2017-10-31 LAB — GLUCOSE, CAPILLARY: GLUCOSE-CAPILLARY: 97 mg/dL (ref 65–99)

## 2017-10-31 MED ORDER — LEVOFLOXACIN 500 MG PO TABS: 500.0000 mg | ORAL_TABLET | Freq: Every day | ORAL | 0 refills | Status: AC

## 2017-10-31 NOTE — Progress Notes (Signed)
Patient IV removed, tolerated well. Patient and husband given discharge at bedside.

## 2017-10-31 NOTE — Evaluation (Signed)
Physical Therapy Evaluation Patient Details Name: Tasha Clarke MRN: [id number] DOB: [birth year] Today's Date: 10/31/2017   History of Present Illness  Tasha Clarke is a 67 y.o. female with medical history significant for hypertension, COPD, chronic pain, hypothyroidism, seizure disorder, and iron deficiency anemia, now presenting to the emergency department for evaluation of general malaise and low blood pressures.  Patient reports that she has been generally weak and experiencing a nonspecific malaise for the past several days.  She has been monitoring her blood pressure at home and it is been low, but today was worse with SBP in the 60s, persisting that way throughout the day, and eventually prompting her presentation here in the ED.  She denies fevers or chills, denies significant cough, reports mild chronic abdominal pain, reports chronic headaches, but denies change in vision or hearing, or focal numbness or weakness.  Reports that her chronic bilateral lower extremity swelling has gone down.    Clinical Impression  Patient functioning at baseline for functional mobility and gait.  Patient states she limits herself to walking short distances only at home due to fear of falling secondary to having left foot drop and numerous neck and back surgeries.  Plan:  Patient discharged from physical therapy to care of nursing for out of bed as tolerated for length of stay.    Follow Up Recommendations No PT follow up    Equipment Recommendations  None recommended by PT    Recommendations for Other Services       Precautions / Restrictions Precautions Precautions: Fall Restrictions Weight Bearing Restrictions: No      Mobility  Bed Mobility Overal bed mobility: Modified Independent                Transfers Overall transfer level: Modified independent                  Ambulation/Gait Ambulation/Gait assistance: Supervision Ambulation Distance (Feet): 2 Feet Assistive  device: None Gait Pattern/deviations: Decreased step length - left;Decreased stance time - right;Decreased stride length Gait velocity: decreased   General Gait Details: limited to 3-4 steps at bedside due to c/o SOB and fear of falling  Stairs            Wheelchair Mobility    Modified Rankin (Stroke Patients Only)       Balance Overall balance assessment: Mild deficits observed, not formally tested                                           Pertinent Vitals/Pain Pain Assessment: No/denies pain    Home Living Family/patient expects to be discharged to:: Private residence Living Arrangements: Spouse/significant other Available Help at Discharge: Family;Available PRN/intermittently Type of Home: House Home Access: Ramped entrance     Home Layout: One level Home Equipment: Wheelchair - manual;Bedside commode;Shower seat;Walker - 4 wheels      Prior Function Level of Independence: Independent with assistive device(s)         Comments: ambulates short household distances with Rollator, uses wheelchair mostly, does not walk mostly due to fear of falling, O2 dependent     Hand Dominance        Extremity/Trunk Assessment   Upper Extremity Assessment Upper Extremity Assessment: Overall WFL for tasks assessed    Lower Extremity Assessment Lower Extremity Assessment: Overall WFL for tasks assessed    Cervical / Trunk Assessment Cervical /  Trunk Assessment: Normal  Communication   Communication: No difficulties  Cognition Arousal/Alertness: Awake/alert Behavior During Therapy: WFL for tasks assessed/performed Overall Cognitive Status: Within Functional Limits for tasks assessed                                        General Comments      Exercises     Assessment/Plan    PT Assessment Patent does not need any further PT services  PT Problem List         PT Treatment Interventions      PT Goals (Current goals  can be found in the Care Plan section)  Acute Rehab PT Goals Patient Stated Goal: return home with spouse to assist PT Goal Formulation: With patient Time For Goal Achievement: 11/14/2017 Potential to Achieve Goals: Good    Frequency     Barriers to discharge        Co-evaluation               AM-PAC PT "6 Clicks" Daily Activity  Outcome Measure Difficulty turning over in bed (including adjusting bedclothes, sheets and blankets)?: None Difficulty moving from lying on back to sitting on the side of the bed? : None Difficulty sitting down on and standing up from a chair with arms (e.g., wheelchair, bedside commode, etc,.)?: None Help needed moving to and from a bed to chair (including a wheelchair)?: None Help needed walking in hospital room?: A Little Help needed climbing 3-5 steps with a railing? : A Little 6 Click Score: 22    End of Session Equipment Utilized During Treatment: Oxygen Activity Tolerance: Patient tolerated treatment well;Patient limited by fatigue Patient left: in chair;with call bell/phone within reach Nurse Communication: Mobility status;Other (comment)(RN notified patient left in chair) PT Visit Diagnosis: Unsteadiness on feet (R26.81);Other abnormalities of gait and mobility (R26.89);Muscle weakness (generalized) (M62.81)    Time: 6606-3016 PT Time Calculation (min) (ACUTE ONLY): 23 min   Charges:   PT Evaluation $PT Eval Moderate Complexity: 1 Mod PT Treatments $Therapeutic Activity: 23-37 mins   PT G Codes:        11:43 AM, Nov 14, 2017 Lonell Grandchild, MPT Physical Therapist with [facility] 336 [phone] office [phone] mobile phone

## 2017-10-31 NOTE — Care Management Important Message (Signed)
Important Message  Patient Details  Name: Tasha Clarke MRN: [id number] Date of Birth: 02/17/1951   Medicare Important Message Given:  Yes    Carlissa Pesola, Chauncey [place], RN 10/31/2017, 10:26 AM

## 2017-10-31 NOTE — Discharge Summary (Signed)
Physician Discharge Summary  Tasha Clarke AYT:[id number] DOB: Mar 27, 1951 DOA: 10/28/2017  PCP: Dettinger, Fransisca Kaufmann, MD  Admit date: 10/28/2017 Discharge date: 10/31/2017  Time spent: 45 minutes  Recommendations for Outpatient Follow-up:  -To be discharged home today. -Advise follow-up with PCP in 1 week. -To complete a course of Levaquin for her UTI.  Discharge Diagnoses:  Principal Problem:   Severe sepsis with septic shock (HCC) Active Problems:   Acute lower UTI   COPD (chronic obstructive pulmonary disease) (HCC)   Seizure disorder (HCC)   Depression, recurrent (HCC)   Chronic pain syndrome   Hypothyroid   Iron deficiency anemia   AKI (acute kidney injury) ([place])   Discharge Condition: Stable and improved  Filed Weights   10/29/17 0500 10/30/17 0500 10/31/17 0550  Weight: 101 kg (222 lb 10.6 oz) 102.1 kg (225 lb 1.4 oz) 97.7 kg (215 lb 6.2 oz)    History of present illness:  As per Dr. Myna Hidalgo on 4/19: Tasha Clarke is a 67 y.o. female with medical history significant for hypertension, COPD, chronic pain, hypothyroidism, seizure disorder, and iron deficiency anemia, now presenting to the emergency department for evaluation of general malaise and low blood pressures.  Patient reports that she has been generally weak and experiencing a nonspecific malaise for the past several days.  She has been monitoring her blood pressure at home and it is been low, but today was worse with SBP in the 60s, persisting that way throughout the day, and eventually prompting her presentation here in the ED.  She denies fevers or chills, denies significant cough, reports mild chronic abdominal pain, reports chronic headaches, but denies change in vision or hearing, or focal numbness or weakness.  Reports that her chronic bilateral lower extremity swelling has gone down.  ED Course: Upon arrival to the ED, patient is found to be afebrile, saturating adequately on room air, blood pressure 69/29,  and normal heart rate.  EKG features a sinus rhythm with PVCs and chest x-ray is negative for acute cardiopulmonary disease.  Chemistry panel reveals a BUN of 30 and creatinine 1.83, up from 0.88 last month.  CBC features a new leukocytosis to 11,700 and a stable chronic normocytic anemia with hemoglobin of 9.6.  Troponin is undetectable, BNP is normal, and lactic acid is reassuringly normal.  CT of the chest reveals cardiomegaly, coronary artery disease, mild peribronchial thickening, mild esophageal thickening, and an incidental lung nodule.  Patient was given 2 L of lactated Ringer's in the ED, blood pressure improved initially, but has since come back down.  She will be admitted to the ICU for ongoing evaluation and management of shock, most likely hypovolemic.     Hospital Course:   Severe sepsis with septic shock -Due to UTI. -We were able to wean off pressors afternoon of 4/20. -Central line has been discontinued -Urine culture with pansensitive Klebsiella, continue Levaquin to complete 4 more days of therapy on discharge.  Acute renal failure -Resolved. -Baseline creatinine around 0.88, was 1.83 on admission down to 0.54 on 4/21 today after IV fluid administration -Acute renal failure secondary to prerenal azotemia and ATN in the face of severe sepsis with shock. -Losartan is being held on discharge.  Iron deficiency anemia -Hemoglobin around the low 9 range at baseline, 8.2 today, likely due to dilution effect.   -Recent iron studies show ferritin of 11 which is quite consistent with iron deficiency anemia. -Was provided with a dose of feraheme while in the hospital.  Hypothyroidism -  Continue Synthroid  Seizure disorder -On Keppra, so far no seizure activity while hospitalized.  COPD -Compensated -Continue ICS/L ABA, as needed nebs.  Depression -Continue Cymbalta  Hyperlipidemia -Continue statin  History of hypertension -Presented in septic shock. -Off  pressors for close to 24 hours. -We will resume metoprolol on discharge, losartan remains on hold.     Procedures:  None   Consultations:  None  Discharge Instructions  Discharge Instructions    Diet - low sodium heart healthy   Complete by:  As directed    Increase activity slowly   Complete by:  As directed      Allergies as of 10/31/2017      Reactions   Cephalexin Hives   Ketorolac Tromethamine Hives   Lac Bovis Nausea And Vomiting   Imdur  [isosorbide Dinitrate]    Iodinated Diagnostic Agents    Other Other (See Comments)   Alka seltzer causes blurred vision, fainting   Sodium Bicarbonate-citric Acid    Milk-related Compounds Nausea And Vomiting      Medication List    STOP taking these medications   butalbital-acetaminophen-caffeine 50-325-40 MG tablet Commonly known as:  FIORICET, ESGIC   furosemide 20 MG tablet Commonly known as:  LASIX   losartan 100 MG tablet Commonly known as:  COZAAR     TAKE these medications   albuterol 108 (90 Base) MCG/ACT inhaler Commonly known as:  PROVENTIL HFA;VENTOLIN HFA Inhale 2 puffs into the lungs every 6 (six) hours as needed for wheezing or shortness of breath.   alendronate 70 MG tablet Commonly known as:  FOSAMAX Take 70 mg by mouth every Monday. Take with a full glass of water on an empty stomach.   aspirin EC 81 MG tablet Take 81 mg by mouth daily.   atorvastatin 40 MG tablet Commonly known as:  LIPITOR Take 40 mg by mouth daily.   cetirizine 10 MG tablet Commonly known as:  ZYRTEC Take 1 tablet (10 mg total) by mouth daily.   cyclobenzaprine 10 MG tablet Commonly known as:  FLEXERIL Take 10 mg by mouth 2 (two) times daily as needed for muscle spasms.   DULoxetine 30 MG capsule Commonly known as:  CYMBALTA Take 30 mg by mouth daily.   EPINEPHrine 0.3 mg/0.3 mL Soaj injection Commonly known as:  EPI-PEN Inject 0.3 mg into the muscle as needed.   Fish Oil 1000 MG Caps Take 1,000 mg by mouth  daily.   fluticasone 50 MCG/ACT nasal spray Commonly known as:  FLONASE Place 2 sprays into both nostrils daily.   fluticasone furoate-vilanterol 200-25 MCG/INH Aepb Commonly known as:  BREO ELLIPTA Inhale 1 puff into the lungs daily.   Fluticasone-Salmeterol 250-50 MCG/DOSE Aepb Commonly known as:  ADVAIR Inhale 1 puff into the lungs 2 (two) times daily.   gabapentin 600 MG tablet Commonly known as:  NEURONTIN Take 1 tablet (600 mg total) by mouth 4 (four) times daily.   ipratropium-albuterol 0.5-2.5 (3) MG/3ML Soln Commonly known as:  DUONEB Take 3 mLs by nebulization every 6 (six) hours as needed (shortness of breathe).   Iron 325 (65 Fe) MG Tabs Take 1 tablet (325 mg total) by mouth daily. What changed:    how much to take  when to take this   levETIRAcetam 1000 MG tablet Commonly known as:  KEPPRA Take 1 tablet (1,000 mg total) by mouth at bedtime.   levETIRAcetam 750 MG tablet Commonly known as:  KEPPRA Take 1 tablet (750 mg total) by mouth every  morning.   levofloxacin 500 MG tablet Commonly known as:  LEVAQUIN Take 1 tablet (500 mg total) by mouth daily for 3 days. Start taking on:  11/01/2017   levothyroxine 50 MCG tablet Commonly known as:  SYNTHROID, LEVOTHROID Take 50 mcg by mouth daily before breakfast.   Magnesium 400 MG Caps Take 400 mg by mouth daily.   metoprolol tartrate 25 MG tablet Commonly known as:  LOPRESSOR Take 25 mg by mouth 2 (two) times daily.   Morphine-Naltrexone 20-0.8 MG Cpcr Take 1 capsule by mouth 2 (two) times daily.   multivitamin tablet Take 1 tablet by mouth daily.   NARCAN 4 MG/0.1ML Liqd nasal spray kit Generic drug:  naloxone Place 1 spray into the nose daily as needed (overdose.).   TYLENOL PM EXTRA STRENGTH 25-500 MG Tabs tablet Generic drug:  diphenhydramine-acetaminophen Take 1 tablet by mouth at bedtime as needed (pain).   Vitamin D-3 5000 units Tabs Take 5,000 Units by mouth daily.      Allergies    Allergen Reactions  . Cephalexin Hives  . Ketorolac Tromethamine Hives  . Lac Bovis Nausea And Vomiting  . Imdur  [Isosorbide Dinitrate]   . Iodinated Diagnostic Agents   . Other Other (See Comments)    Alka seltzer causes blurred vision, fainting  . Sodium Bicarbonate-Citric Acid   . Milk-Related Compounds Nausea And Vomiting   Follow-up Information    Dettinger, Fransisca Kaufmann, MD. Schedule an appointment as soon as possible for a visit on 11/16/2017.   Specialties:  Family Medicine, Cardiology Why:  1:10 Contact information: [place] [id number] [phone]            The results of significant diagnostics from this hospitalization (including imaging, microbiology, ancillary and laboratory) are listed below for reference.    Significant Diagnostic Studies: Dg Chest 2 View  Result Date: 10/24/2017 CLINICAL DATA:  Reported soft tissue prominence anterior chest wall EXAM: CHEST - 2 VIEW COMPARISON:  August 25, 2017 FINDINGS: There is no edema or consolidation. Heart is upper normal in size with pulmonary vascularity within normal limits. No adenopathy. There is aortic atherosclerosis. Thoracic stimulator tips are in the midthoracic region. There is postoperative change in the lower cervical spine. There is postoperative change in the neck region to the right and left of midline. No bony lesions are evident. No soft tissue masses appreciable by radiography. IMPRESSION: No evident soft tissue mass appreciable by radiography. No edema or consolidation. Heart upper normal in size. There is aortic atherosclerosis. No adenopathy evident. If there remains concern for soft tissue lesion, chest CT, ideally with intravenous contrast, would be the imaging study of choice for further assessment. Aortic Atherosclerosis (ICD10-I70.0). Electronically Signed   By: Lowella Grip III M.D.   On: 10/24/2017 08:57   Ct Chest Wo Contrast  Result Date: 10/28/2017 CLINICAL DATA:  Chest  pressure beginning yesterday with dyspnea. EXAM: CT CHEST WITHOUT CONTRAST TECHNIQUE: Multidetector CT imaging of the chest was performed following the standard protocol without IV contrast. COMPARISON:  Same day CXR FINDINGS: Cardiovascular: Moderate atherosclerosis at the origins of the great vessels with normal branch pattern. Moderate atherosclerosis of the thoracic aorta without aneurysm. Mild dilatation of the main pulmonary artery to 3 cm. Left main and three-vessel coronary arteriosclerosis. Cardiomegaly without pericardial effusion. Mediastinum/Nodes: Mild diffuse thickening of the mid to distal esophagus query esophagitis. Small hiatal hernia. No mediastinal adenopathy. No definite hilar adenopathy though assessment is limited by lack of IV contrast. There is  no axillary adenopathy. Trachea and mainstem bronchi are patent. Lungs/Pleura: Mild peribronchial thickening with faint bilateral multilobar ground-glass opacities likely representing areas of alveolitis or pneumonitis versus hypoventilatory change. There is a nodule in the medial right middle lobe measuring approximately 5.2 mm in average, series 4 image 74. No effusion or pneumothorax. There is bibasilar atelectasis. Upper Abdomen: No acute abnormality. Musculoskeletal: ACDF of the included lower cervical spine. Neural stimulator device projects at the midthoracic spine to the T8 level. IMPRESSION: 1. Cardiomegaly with moderate left main and three-vessel coronary arteriosclerosis. 2. Moderate atherosclerosis of the thoracic aorta without aneurysm. 3. Mild peribronchial thickening and ground-glass opacities bilaterally likely reflecting bronchitic change, alveolitis and/or pneumonitis. Hypoventilatory change may also contribute to some of the ground-glass appearance. 4. Mild diffuse thickening of the esophagus query esophagitis. Small hiatal hernia. 5. Nodule in the right middle lobe averaging 5.2 mm. Initial follow-up with CT at 6-12 months is  recommended to confirm persistence. If persistent, repeat CT is recommended every 2 years until 5 years of stability has been established. This recommendation follows the consensus statement: Guidelines for Management of Incidental Pulmonary Nodules Detected on CT Images: From the Fleischner Society 2017; Radiology 2017; 284:228-243. Aortic Atherosclerosis (ICD10-I70.0). Electronically Signed   By: Ashley Royalty M.D.   On: 10/28/2017 20:29   Dg Chest Port 1 View  Result Date: 10/28/2017 CLINICAL DATA:  Chest pain EXAM: PORTABLE CHEST 1 VIEW COMPARISON:  10/24/2017 FINDINGS: Cardiac shadow remains enlarged. Aortic calcifications are again noted. Postsurgical changes are again seen and stable. The lungs are well aerated bilaterally. No focal infiltrate or sizable effusion is seen. No acute bony abnormality is noted. IMPRESSION: No active disease. Electronically Signed   By: [name] M.D.   On: 10/28/2017 16:51    Microbiology: Recent Results (from the past 240 hour(s))  Urine culture     Status: Abnormal   Collection Time: 10/28/17  8:32 PM  Result Value Ref Range Status   Specimen Description   Final    URINE, CATHETERIZED Performed at [facility], [address], [place], [place] [address]    Special Requests   Final    NONE Performed at [facility], [address] [place], [place] [address]    Culture >=100,000 COLONIES/mL KLEBSIELLA PNEUMONIAE (A)  Final   Report Status 10/31/2017 FINAL  Final   Organism ID, Bacteria KLEBSIELLA PNEUMONIAE (A)  Final      Susceptibility   Klebsiella pneumoniae - MIC*    AMPICILLIN >=32 RESISTANT Resistant     CEFAZOLIN <=4 SENSITIVE Sensitive     CEFTRIAXONE <=1 SENSITIVE Sensitive     CIPROFLOXACIN <=0.25 SENSITIVE Sensitive     GENTAMICIN <=1 SENSITIVE Sensitive     IMIPENEM <=0.25 SENSITIVE Sensitive     NITROFURANTOIN 64 INTERMEDIATE Intermediate     TRIMETH/SULFA <=20 SENSITIVE Sensitive     AMPICILLIN/SULBACTAM 4 SENSITIVE Sensitive      PIP/TAZO <=4 SENSITIVE Sensitive     Extended ESBL NEGATIVE Sensitive     * >=100,000 COLONIES/mL KLEBSIELLA PNEUMONIAE  Culture, blood (x 2)     Status: None (Preliminary result)   Collection Time: 10/28/17 11:00 PM  Result Value Ref Range Status   Specimen Description A-LINE  Final   Special Requests   Final    BOTTLES DRAWN AEROBIC AND ANAEROBIC Blood Culture adequate volume DRAWN BY RN   Culture   Final    NO GROWTH 3 DAYS Performed at [facility], [address], [place], [place] [address]  Report Status PENDING  Incomplete  Culture, blood (x 2)     Status: None (Preliminary result)   Collection Time: 10/28/17 11:19 PM  Result Value Ref Range Status   Specimen Description BLOOD LEFT WRIST  Final   Special Requests   Final    BOTTLES DRAWN AEROBIC ONLY Blood Culture adequate volume   Culture   Final    NO GROWTH 3 DAYS Performed at [facility], [address], [place], [place] [address]    Report Status PENDING  Incomplete  MRSA PCR Screening     Status: Abnormal   Collection Time: 10/28/17 11:50 PM  Result Value Ref Range Status   MRSA by PCR POSITIVE (A) NEGATIVE Final    Comment:        The GeneXpert MRSA Assay (FDA approved for NASAL specimens only), is one component of a comprehensive MRSA colonization surveillance program. It is not intended to diagnose MRSA infection nor to guide or monitor treatment for MRSA infections. RESULT CALLED TO, READ BACK BY AND VERIFIED WITH: GAMMON,S'@0630'  BY MATTHEWS, B 4.20.19 Performed at [facility] [place], [place] [address]      Labs: Basic Metabolic Panel: Recent Labs  Lab 10/28/17 1545 10/29/17 0413 10/30/17 0444 10/31/17 0607  NA 135 139 138 138  K 4.1 4.6 5.0 4.7  CL 99* 108 109 103  CO2 '23 22 22 27  ' GLUCOSE 112* 85 89 99  BUN 30* 24* 14 9  CREATININE 1.83* 1.05* 0.54 0.55  CALCIUM 8.5* 7.6* 8.5* 9.5   Liver Function Tests: Recent Labs  Lab 10/29/17 0413  AST 19  ALT 13*  ALKPHOS 87   BILITOT 0.3  PROT 5.8*  ALBUMIN 2.8*   No results for input(s): LIPASE, AMYLASE in the last 168 hours. No results for input(s): AMMONIA in the last 168 hours. CBC: Recent Labs  Lab 10/28/17 1545 10/29/17 0413 10/30/17 0444 10/31/17 0607  WBC 11.7* 8.6 6.0 5.7  NEUTROABS  --  5.4  --   --   HGB 9.6* 8.5* 8.2* 9.0*  HCT 34.8* 31.0* 29.7* 31.7*  MCV 82.5 84.2 83.9 82.8  PLT 334 273 260 254   Cardiac Enzymes: Recent Labs  Lab 10/28/17 1545 10/28/17 2300 10/29/17 0413 10/29/17 1014  TROPONINI <0.03 <0.03 <0.03 <0.03   BNP: BNP (last 3 results) Recent Labs    08/25/17 1020 10/28/17 1657  BNP 202.3* 53.0    ProBNP (last 3 results) No results for input(s): PROBNP in the last 8760 hours.  CBG: Recent Labs  Lab 10/29/17 0743 10/30/17 0751 10/31/17 0726  GLUCAP 91 101* 97       Signed:  [facility] Pager: [phone] 10/31/2017, 12:51 PM

## 2017-10-31 NOTE — Telephone Encounter (Signed)
Left message on patient's personal to return my call for transitional care management. Patient previously scheduled a hospital follow up on 11/16/17.

## 2017-10-31 NOTE — Care Management (Addendum)
Patient discharging home. Evaluated by PT, patient at baseline, transfers only, reports not really ambulating at home. Declines HH services. Has had in the past and feels they are not needed.

## 2017-11-02 ENCOUNTER — Ambulatory Visit (HOSPITAL_COMMUNITY): Payer: Medicare PPO

## 2017-11-02 LAB — CULTURE, BLOOD (ROUTINE X 2)
Culture: NO GROWTH
Culture: NO GROWTH
Special Requests: ADEQUATE

## 2017-11-02 NOTE — Telephone Encounter (Signed)
2nd attempt to contact patient to discuss transitional care management was unsuccessful. Voicemail left for her to return my call to discuss transitional care management.   Because two contacts were attempted I will document in the appointment notes that transitional care management can be initiated at her hospital follow up appointment on 11/16/17.  Chong Sicilian, RN

## 2017-11-16 ENCOUNTER — Inpatient Hospital Stay (HOSPITAL_COMMUNITY): Payer: Medicare PPO

## 2017-11-16 ENCOUNTER — Inpatient Hospital Stay (HOSPITAL_COMMUNITY): Payer: Medicare PPO | Attending: Hematology | Admitting: Hematology

## 2017-11-16 ENCOUNTER — Ambulatory Visit: Payer: Medicare PPO | Admitting: Family Medicine

## 2017-11-16 ENCOUNTER — Other Ambulatory Visit: Payer: Self-pay

## 2017-11-16 ENCOUNTER — Encounter (HOSPITAL_COMMUNITY): Payer: Self-pay | Admitting: Hematology

## 2017-11-16 VITALS — BP 110/57 | HR 73 | Temp 98.5°F | Resp 18 | Ht 65.0 in | Wt 220.0 lb

## 2017-11-16 DIAGNOSIS — M810 Age-related osteoporosis without current pathological fracture: Secondary | ICD-10-CM | POA: Diagnosis not present

## 2017-11-16 DIAGNOSIS — Z801 Family history of malignant neoplasm of trachea, bronchus and lung: Secondary | ICD-10-CM | POA: Diagnosis not present

## 2017-11-16 DIAGNOSIS — E785 Hyperlipidemia, unspecified: Secondary | ICD-10-CM | POA: Insufficient documentation

## 2017-11-16 DIAGNOSIS — D649 Anemia, unspecified: Secondary | ICD-10-CM

## 2017-11-16 DIAGNOSIS — I509 Heart failure, unspecified: Secondary | ICD-10-CM | POA: Diagnosis not present

## 2017-11-16 DIAGNOSIS — R5382 Chronic fatigue, unspecified: Secondary | ICD-10-CM

## 2017-11-16 DIAGNOSIS — G4730 Sleep apnea, unspecified: Secondary | ICD-10-CM | POA: Diagnosis not present

## 2017-11-16 DIAGNOSIS — I130 Hypertensive heart and chronic kidney disease with heart failure and stage 1 through stage 4 chronic kidney disease, or unspecified chronic kidney disease: Secondary | ICD-10-CM | POA: Insufficient documentation

## 2017-11-16 DIAGNOSIS — D508 Other iron deficiency anemias: Secondary | ICD-10-CM

## 2017-11-16 DIAGNOSIS — J449 Chronic obstructive pulmonary disease, unspecified: Secondary | ICD-10-CM | POA: Insufficient documentation

## 2017-11-16 DIAGNOSIS — Z7982 Long term (current) use of aspirin: Secondary | ICD-10-CM | POA: Diagnosis not present

## 2017-11-16 DIAGNOSIS — Z79899 Other long term (current) drug therapy: Secondary | ICD-10-CM | POA: Insufficient documentation

## 2017-11-16 DIAGNOSIS — K219 Gastro-esophageal reflux disease without esophagitis: Secondary | ICD-10-CM

## 2017-11-16 DIAGNOSIS — N189 Chronic kidney disease, unspecified: Secondary | ICD-10-CM

## 2017-11-16 DIAGNOSIS — F17210 Nicotine dependence, cigarettes, uncomplicated: Secondary | ICD-10-CM | POA: Diagnosis not present

## 2017-11-16 DIAGNOSIS — Z9989 Dependence on other enabling machines and devices: Secondary | ICD-10-CM | POA: Diagnosis not present

## 2017-11-16 DIAGNOSIS — H539 Unspecified visual disturbance: Secondary | ICD-10-CM | POA: Diagnosis not present

## 2017-11-16 DIAGNOSIS — R51 Headache: Secondary | ICD-10-CM | POA: Diagnosis not present

## 2017-11-16 DIAGNOSIS — Z9981 Dependence on supplemental oxygen: Secondary | ICD-10-CM | POA: Diagnosis not present

## 2017-11-16 DIAGNOSIS — E119 Type 2 diabetes mellitus without complications: Secondary | ICD-10-CM | POA: Diagnosis not present

## 2017-11-16 LAB — IRON AND TIBC
IRON: 37 ug/dL (ref 28–170)
Saturation Ratios: 12 % (ref 10.4–31.8)
TIBC: 319 ug/dL (ref 250–450)
UIBC: 282 ug/dL

## 2017-11-16 LAB — CBC WITH DIFFERENTIAL/PLATELET
BASOS ABS: 0 10*3/uL (ref 0.0–0.1)
Basophils Relative: 0 %
EOS ABS: 0.3 10*3/uL (ref 0.0–0.7)
Eosinophils Relative: 3 %
HCT: 35.7 % — ABNORMAL LOW (ref 36.0–46.0)
HEMOGLOBIN: 10.1 g/dL — AB (ref 12.0–15.0)
LYMPHS PCT: 17 %
Lymphs Abs: 1.5 10*3/uL (ref 0.7–4.0)
MCH: 24.5 pg — ABNORMAL LOW (ref 26.0–34.0)
MCHC: 28.3 g/dL — ABNORMAL LOW (ref 30.0–36.0)
MCV: 86.4 fL (ref 78.0–100.0)
Monocytes Absolute: 0.5 10*3/uL (ref 0.1–1.0)
Monocytes Relative: 6 %
NEUTROS ABS: 6.3 10*3/uL (ref 1.7–7.7)
NEUTROS PCT: 74 %
Platelets: 332 10*3/uL (ref 150–400)
RBC: 4.13 MIL/uL (ref 3.87–5.11)
RDW: 21.2 % — ABNORMAL HIGH (ref 11.5–15.5)
WBC: 8.6 10*3/uL (ref 4.0–10.5)

## 2017-11-16 LAB — COMPREHENSIVE METABOLIC PANEL
ALBUMIN: 3.5 g/dL (ref 3.5–5.0)
ALK PHOS: 94 U/L (ref 38–126)
ALT: 15 U/L (ref 14–54)
AST: 20 U/L (ref 15–41)
Anion gap: 8 (ref 5–15)
BUN: 31 mg/dL — ABNORMAL HIGH (ref 6–20)
CALCIUM: 9 mg/dL (ref 8.9–10.3)
CO2: 25 mmol/L (ref 22–32)
Chloride: 104 mmol/L (ref 101–111)
Creatinine, Ser: 0.91 mg/dL (ref 0.44–1.00)
GFR calc Af Amer: >60 mL/min (ref >60)
GFR calc non Af Amer: >60 mL/min (ref >60)
GLUCOSE: 90 mg/dL (ref 65–99)
POTASSIUM: 4 mmol/L (ref 3.5–5.1)
SODIUM: 137 mmol/L (ref 135–145)
Total Bilirubin: 0.5 mg/dL (ref 0.3–1.2)
Total Protein: 6.9 g/dL (ref 6.5–8.1)

## 2017-11-16 LAB — TSH: TSH: 0.854 u[IU]/mL (ref 0.350–4.500)

## 2017-11-16 LAB — RETICULOCYTES
RBC.: 4.13 MIL/uL (ref 3.87–5.11)
Retic Count, Absolute: 161.1 10*3/uL (ref 19.0–186.0)
Retic Ct Pct: 3.9 % — ABNORMAL HIGH (ref 0.4–3.1)

## 2017-11-16 LAB — FOLATE: Folate: 7 ng/mL (ref >5.9)

## 2017-11-16 LAB — FERRITIN: FERRITIN: 57 ng/mL (ref 11–307)

## 2017-11-16 LAB — VITAMIN B12: Vitamin B-12: 398 pg/mL (ref 180–914)

## 2017-11-16 NOTE — Assessment & Plan Note (Signed)
1.  Normocytic anemia: - Recent admission with 2 units of blood transfusion.  Patient has been on oral iron supplements for a long time.  Currently taking 1 tablet 3 times a day.  Stool was tested negative for occult blood on 08/25/2017.  Reportedly had a colonoscopy 3 years ago which was normal.  This was done at an outside facility.  Iron panel and ferritin during last admission shows iron deficiency anemia.  Patient received parenteral iron once in the hospital.  Vitamin W62 and folic acid were borderline.  Patient taking vitamin B12 1 tablet once a month. - I have recommended parenteral iron therapy weekly x2.  I will obtain baseline labs today and repeat them in 6 weeks.  We will see her back in 6 weeks to discuss the results and see the improvement in hemoglobin after parenteral iron.  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  She will likely need maintenance iron infusions.

## 2017-11-16 NOTE — Progress Notes (Signed)
[facility]  CONSULT NOTE  Patient Care Team: Dettinger, Fransisca Kaufmann, MD as PCP - General (Family Medicine)  CHIEF COMPLAINTS/PURPOSE OF CONSULTATION:  Anemia   HISTORY OF PRESENTING ILLNESS:  Tasha Clarke 67 y.o. female is here for initial consultation for anemia.   Chart reviewed. Recent hospitalization noted from 10/28/17-10/31/17 for septic shock.  She was noted to be anemic during hospitalization. She reportedly received IV iron in the hospital; her last Hgb on 10/31/17 was 9 g/dL.   Here today with her husband.    Denies blood in her stools, dark/tarry stools.  Her husband shares that he thinks she had colonoscopy done in [place], [place] about 3-4 years ago; he is not sure about the results "but I don't remember them ever telling us that anything was wrong."   States that she used to take vitamin B12 injections in the past. She states that she takes "vitamin B12 pill once a month."   She has dealt with anemia "since I was a child."  She has been on oral iron for many years, now taking TID since recent hospitalization.  She does report h/o blood transfusion about 1 month ago at [facility];  She thinks she got 2 units PRBCs.  She has never had a bone marrow biopsy in the past that she is aware of.  She has never seen a hematologist in the past that she is aware of.  Reports that she had IV iron infusion during recent hospitalization; reports that she did not feel much better after the IV iron.   She states, "I had bleeding before, but they couldn't tell me where I was bleeding."  Her husband is not aware.   She has chronic fatigue and decreased energy; this has been going on for years. Her energy levels are slightly worse in the past few months.  Her appetite is low as well.  Endorses headaches and vision changes; she has h/o migraine headaches.   She is on chronic O2 via nasal cannula. She is seen today in a wheelchair; at home, she is walking more with rolling walker.    No  h/o gastric bypass. Denies fever, night sweats, or unintentional weight loss in the past 6 months.    She has chronic pain, which is managed by neurology.  She has chronic orthopedic issues as well; had a workplace injury many years ago.    Recommend 2 doses of IV iron in the coming weeks.  Will collect additional labs today. Return to cancer center in about 6 weeks for follow-up with labs a few days prior.     MEDICAL HISTORY:  Past Medical History:  Diagnosis Date  . Allergy   . Anemia   . Anxiety   . Arthritis   . Asthma   . Blood transfusion without reported diagnosis   . Cataract   . CHF (congestive heart failure) ([place])   . Chronic kidney disease   . Clotting disorder ([place])   . COPD (chronic obstructive pulmonary disease) ([place])   . Depression   . Diabetes mellitus without complication ([place])   . Emphysema of lung ([place])   . GERD (gastroesophageal reflux disease)   . Hyperlipidemia   . Hypertension   . Neuromuscular disorder ([place])   . Osteoporosis   . Oxygen deficiency   . Seizures ([place])   . Skin cancer    2 areas removed more than 10 years ago  . Sleep apnea 1985   wears cpap nightly  .  Thyroid disease     SURGICAL HISTORY: Past Surgical History:  Procedure Laterality Date  . APPENDECTOMY    . EYE SURGERY    . FRACTURE SURGERY    . heart stents    . JOINT REPLACEMENT    . SPINAL CORD STIMULATOR IMPLANT  2015  . SPINE SURGERY      SOCIAL HISTORY: Social History   Socioeconomic History  . Marital status: Married    Spouse name: Not on file  . Number of children: Not on file  . Years of education: Not on file  . Highest education level: Not on file  Occupational History  . Not on file  Social Needs  . Financial resource strain: Not on file  . Food insecurity:    Worry: Not on file    Inability: Not on file  . Transportation needs:    Medical: Not on file    Non-medical: Not on file  Tobacco Use  . Smoking status: Current Every Day Smoker     Packs/day: 0.75    Years: 43.00    Pack years: 32.25    Types: Cigarettes  . Smokeless tobacco: Never Used  Substance and Sexual Activity  . Alcohol use: No    Frequency: Never  . Drug use: No  . Sexual activity: Not on file  Lifestyle  . Physical activity:    Days per week: Not on file    Minutes per session: Not on file  . Stress: Not on file  Relationships  . Social connections:    Talks on phone: Not on file    Gets together: Not on file    Attends religious service: Not on file    Active member of club or organization: Not on file    Attends meetings of clubs or organizations: Not on file    Relationship status: Not on file  . Intimate partner violence:    Fear of current or ex partner: Not on file    Emotionally abused: Not on file    Physically abused: Not on file    Forced sexual activity: Not on file  Other Topics Concern  . Not on file  Social History Narrative  . Not on file    FAMILY HISTORY: Family History  Problem Relation Age of Onset  . Arthritis Mother   . Asthma Mother   . Cancer Mother        lung  . Depression Mother   . Diabetes Mother   . Arthritis Father   . Asthma Father   . Depression Father   . Asthma Sister   . Depression Sister   . Diabetes Sister   . Arthritis Maternal Grandmother   . Asthma Maternal Grandmother   . Depression Maternal Grandmother   . Arthritis Maternal Grandfather   . Asthma Maternal Grandfather   . Arthritis Paternal Grandmother   . Asthma Paternal Grandmother   . Arthritis Paternal Grandfather   . Asthma Paternal Grandfather   . COPD Paternal Grandfather   . Diabetes Sister   . Mental illness Sister   . Anemia Sister   . Hypertension Son   . Post-traumatic stress disorder Son   . Allergies Daughter     ALLERGIES:  is allergic to cephalexin; ketorolac tromethamine; lac bovis; imdur  [isosorbide dinitrate]; iodinated diagnostic agents; other; sodium bicarbonate-citric acid; and milk-related  compounds.  MEDICATIONS:  Current Outpatient Medications  Medication Sig Dispense Refill  . albuterol (PROVENTIL HFA) 108 (90 Base) MCG/ACT inhaler Inhale into  the lungs.    Marland Kitchen alendronate (FOSAMAX) 70 MG tablet Take 70 mg by mouth every Monday. Take with a full glass of water on an empty stomach.     Marland Kitchen aspirin EC 81 MG tablet Take 81 mg by mouth daily.    Marland Kitchen atorvastatin (LIPITOR) 40 MG tablet Take 40 mg by mouth daily.    . cetirizine (ZYRTEC) 10 MG tablet Take 1 tablet (10 mg total) by mouth daily. 30 tablet 3  . Cholecalciferol (VITAMIN D-3) 5000 units TABS Take 5,000 Units by mouth daily.    . cyclobenzaprine (FLEXERIL) 10 MG tablet Take 10 mg by mouth 2 (two) times daily as needed for muscle spasms.    . diphenhydramine-acetaminophen (TYLENOL PM EXTRA STRENGTH) 25-500 MG TABS tablet Take 1 tablet by mouth at bedtime as needed (pain).     . DULoxetine (CYMBALTA) 30 MG capsule Take 30 mg by mouth daily.    Marland Kitchen EPINEPHrine 0.3 mg/0.3 mL IJ SOAJ injection Inject 0.3 mg into the muscle as needed.    . Ferrous Sulfate (IRON) 325 (65 Fe) MG TABS Take 1 tablet (325 mg total) by mouth daily. (Patient taking differently: Take 68 mg by mouth 2 (two) times daily. ) 90 each 0  . fluticasone (FLONASE) 50 MCG/ACT nasal spray Place 2 sprays into both nostrils daily. 16 g 2  . fluticasone furoate-vilanterol (BREO ELLIPTA) 200-25 MCG/INH AEPB Inhale 1 puff into the lungs daily. 28 each 2  . Fluticasone-Salmeterol (ADVAIR) 250-50 MCG/DOSE AEPB Inhale 1 puff into the lungs 2 (two) times daily.    Marland Kitchen gabapentin (NEURONTIN) 600 MG tablet Take 1 tablet (600 mg total) by mouth 4 (four) times daily. 120 tablet 2  . ipratropium-albuterol (DUONEB) 0.5-2.5 (3) MG/3ML SOLN Take 3 mLs by nebulization every 6 (six) hours as needed (shortness of breathe).     . levETIRAcetam (KEPPRA) 1000 MG tablet Take 1 tablet (1,000 mg total) by mouth at bedtime. 30 tablet 2  . levETIRAcetam (KEPPRA) 750 MG tablet Take 1 tablet (750 mg  total) by mouth every morning. 30 tablet 2  . levothyroxine (SYNTHROID, LEVOTHROID) 50 MCG tablet Take 50 mcg by mouth daily before breakfast.    . Magnesium 400 MG CAPS Take 400 mg by mouth daily.    . metoprolol tartrate (LOPRESSOR) 25 MG tablet Take 25 mg by mouth 2 (two) times daily.    . Morphine-Naltrexone 20-0.8 MG CPCR Take 1 capsule by mouth 2 (two) times daily.    . Multiple Vitamin (MULTIVITAMIN) tablet Take 1 tablet by mouth daily.    . naloxone (NARCAN) nasal spray 4 mg/0.1 mL Place into the nose.    . Omega-3 Fatty Acids (FISH OIL) 1000 MG CAPS Take 1,000 mg by mouth daily.     No current facility-administered medications for this visit.     REVIEW OF SYSTEMS:   Constitutional: Denies fevers, chills or abnormal night sweats Eyes: Denies blurriness of vision, double vision or watery eyes Ears, nose, mouth, throat, and face: Denies mucositis or sore throat Respiratory: Denies cough, or wheezes.  Dyspnea on exertion. Cardiovascular: Denies palpitation, chest discomfort or lower extremity swelling Gastrointestinal:  Denies nausea, heartburn or change in bowel habits Skin: Denies abnormal skin rashes Lymphatics: Denies new lymphadenopathy or easy bruising Neurological:Denies numbness, tingling or new weaknesses Behavioral/Psych: Mood is stable, no new changes  All other systems were reviewed with the patient and are negative.  PHYSICAL EXAMINATION: ECOG PERFORMANCE STATUS: 1 - Symptomatic but completely ambulatory  Vitals:  11/16/17 1146  BP: (!) 110/57  Pulse: 73  Resp: 18  Temp: 98.5 F (36.9 C)  SpO2: 96%   Filed Weights   11/16/17 1146  Weight: 220 lb (99.8 kg)    GENERAL:alert, no distress and comfortable SKIN: skin color, texture, turgor are normal, no rashes or significant lesions EYES: normal, conjunctiva are pink and non-injected, sclera clear OROPHARYNX:no exudate, no erythema and lips, buccal mucosa, and tongue normal  NECK: supple, thyroid normal  size, non-tender, without nodularity LYMPH:  no palpable lymphadenopathy in the cervical, axillary or inguinal LUNGS: clear to auscultation and percussion with normal breathing effort HEART: regular rate & rhythm and no murmurs and no lower extremity edema ABDOMEN:abdomen soft, non-tender and normal bowel sounds Musculoskeletal:no cyanosis of digits and no clubbing  PSYCH: alert & oriented x 3 with fluent speech  LABS: I have reviewed the data as listed Recent Results (from the past 2160 hour(s))  TSH     Status: None   Collection Time: 08/24/17 11:02 AM  Result Value Ref Range   TSH 3.500 0.450 - 4.500 uIU/mL  CBC with Differential/Platelet     Status: Abnormal   Collection Time: 08/24/17 11:02 AM  Result Value Ref Range   WBC 7.1 3.4 - 10.8 x10E3/uL   RBC 3.28 (L) 3.77 - 5.28 x10E6/uL   Hemoglobin 6.6 (LL) 11.1 - 15.9 g/dL    Comment:                   Client Requested Flag   Hematocrit 24.1 (L) 34.0 - 46.6 %   MCV 74 (L) 79 - 97 fL   MCH 20.1 (L) 26.6 - 33.0 pg   MCHC 27.4 (L) 31.5 - 35.7 g/dL   RDW 20.3 (H) 12.3 - 15.4 %   Platelets 259 150 - 379 x10E3/uL   Neutrophils 64 Not Estab. %   Lymphs 21 Not Estab. %   Monocytes 12 Not Estab. %   Eos 3 Not Estab. %   Basos 0 Not Estab. %   Neutrophils Absolute 4.5 1.4 - 7.0 x10E3/uL   Lymphocytes Absolute 1.5 0.7 - 3.1 x10E3/uL   Monocytes Absolute 0.9 0.1 - 0.9 x10E3/uL   EOS (ABSOLUTE) 0.2 0.0 - 0.4 x10E3/uL   Basophils Absolute 0.0 0.0 - 0.2 x10E3/uL   Immature Granulocytes 0 Not Estab. %   Immature Grans (Abs) 0.0 0.0 - 0.1 x10E3/uL  CMP14+EGFR     Status: Abnormal   Collection Time: 08/24/17 11:02 AM  Result Value Ref Range   Glucose 80 65 - 99 mg/dL   BUN 20 8 - 27 mg/dL   Creatinine, Ser 0.82 0.57 - 1.00 mg/dL   GFR calc non Af Amer 75 >59 mL/min/1.73   GFR calc Af Amer 87 >59 mL/min/1.73   BUN/Creatinine Ratio 24 12 - 28   Sodium 139 134 - 144 mmol/L   Potassium 5.2 3.5 - 5.2 mmol/L   Chloride 106 96 - 106  mmol/L   CO2 20 20 - 29 mmol/L   Calcium 9.2 8.7 - 10.3 mg/dL   Total Protein 6.2 6.0 - 8.5 g/dL   Albumin 3.5 (L) 3.6 - 4.8 g/dL   Globulin, Total 2.7 1.5 - 4.5 g/dL   Albumin/Globulin Ratio 1.3 1.2 - 2.2   Bilirubin Total <0.2 0.0 - 1.2 mg/dL   Alkaline Phosphatase 92 39 - 117 IU/L   AST 19 0 - 40 IU/L   ALT 14 0 - 32 IU/L  Lipid panel  Status: Abnormal   Collection Time: 08/24/17 11:02 AM  Result Value Ref Range   Cholesterol, Total 134 100 - 199 mg/dL   Triglycerides 164 (H) 0 - 149 mg/dL   HDL 26 (L) >39 mg/dL   VLDL Cholesterol Cal 33 5 - 40 mg/dL   LDL Calculated 75 0 - 99 mg/dL   Chol/HDL Ratio 5.2 (H) 0.0 - 4.4 ratio    Comment:                                   T. Chol/HDL Ratio                                             Men  Women                               1/2 Avg.Risk  3.4    3.3                                   Avg.Risk  5.0    4.4                                2X Avg.Risk  9.6    7.1                                3X Avg.Risk 23.4   11.0   ABO/Rh     Status: None   Collection Time: 08/25/17 10:11 AM  Result Value Ref Range   ABO/RH(D)      B POS Performed at [facility], [place] [address] [place], [place] [address]   Comprehensive metabolic panel     Status: Abnormal   Collection Time: 08/25/17 10:20 AM  Result Value Ref Range   Sodium 138 135 - 145 mmol/L   Potassium 4.2 3.5 - 5.1 mmol/L   Chloride 105 101 - 111 mmol/L   CO2 24 22 - 32 mmol/L   Glucose, Bld 108 (H) 65 - 99 mg/dL   BUN 24 (H) 6 - 20 mg/dL   Creatinine, Ser 0.95 0.44 - 1.00 mg/dL   Calcium 9.3 8.9 - 10.3 mg/dL   Total Protein 6.8 6.5 - 8.1 g/dL   Albumin 3.5 3.5 - 5.0 g/dL   AST 22 15 - 41 U/L   ALT 15 14 - 54 U/L   Alkaline Phosphatase 92 38 - 126 U/L   Total Bilirubin 0.4 0.3 - 1.2 mg/dL   GFR calc non Af Amer >60 >60 mL/min   GFR calc Af Amer >60 >60 mL/min    Comment: (NOTE) The eGFR has been calculated using the CKD EPI equation. This calculation has  not been validated in all clinical situations. eGFR's persistently <60 mL/min signify possible Chronic Kidney Disease.    Anion gap 9 5 - 15    Comment: Performed at [facility], [place] [address], [place], [place] [address]  Type and screen [place]     Status: None   Collection Time: 08/25/17 10:20 AM  Result  Value Ref Range   ABO/RH(D) B POS    Antibody Screen NEG    Sample Expiration 08/28/2017    Unit Number [id number]    Blood Component Type RED CELLS,LR    Unit division 00    Status of Unit ISSUED,FINAL    Transfusion Status OK TO TRANSFUSE    Crossmatch Result Compatible    Unit Number [id number]    Blood Component Type RBC LR PHER2    Unit division 00    Status of Unit ISSUED,FINAL    Transfusion Status OK TO TRANSFUSE    Crossmatch Result      Compatible Performed at [facility], [place] [address], [place], [place] [address]   CBC with Differential/Platelet     Status: Abnormal   Collection Time: 08/25/17 10:20 AM  Result Value Ref Range   WBC 8.2 4.0 - 10.5 K/uL   RBC 3.24 (L) 3.87 - 5.11 MIL/uL   Hemoglobin 6.7 (LL) 12.0 - 15.0 g/dL    Comment: REPEATED TO VERIFY CRITICAL RESULT CALLED TO, READ BACK BY AND VERIFIED WITH:  YOSIF, M AT 1105 ON 08/25/17 BY MOHAMED, A    HCT 24.8 (L) 36.0 - 46.0 %   MCV 76.5 (L) 78.0 - 100.0 fL   MCH 20.7 (L) 26.0 - 34.0 pg   MCHC 27.0 (L) 30.0 - 36.0 g/dL   RDW 20.5 (H) 11.5 - 15.5 %   Platelets 301 150 - 400 K/uL   Neutrophils Relative % 65 %   Neutro Abs 5.4 1.7 - 7.7 K/uL   Lymphocytes Relative 21 %   Lymphs Abs 1.7 0.7 - 4.0 K/uL   Monocytes Relative 10 %   Monocytes Absolute 0.8 0.1 - 1.0 K/uL   Eosinophils Relative 4 %   Eosinophils Absolute 0.3 0.0 - 0.7 K/uL   Basophils Relative 0 %   Basophils Absolute 0.0 0.0 - 0.1 K/uL    Comment: Performed at [facility], [place] [address], [place], [place] [address]  Brain natriuretic peptide      Status: Abnormal   Collection Time: 08/25/17 10:20 AM  Result Value Ref Range   B Natriuretic Peptide 202.3 (H) 0.0 - 100.0 pg/mL    Comment: Performed at [facility], [place] [address], [place], [place] [address]  Troponin I     Status: Abnormal   Collection Time: 08/25/17 10:20 AM  Result Value Ref Range   Troponin I 0.03 (HH) <0.03 ng/mL    Comment: CRITICAL RESULT CALLED TO, READ BACK BY AND VERIFIED WITH: [facility] RN AT 1203 08/25/17 MULLINS,T Performed at [facility], [place] [address], [place], [place] [address]   BPAM RBC     Status: None   Collection Time: 08/25/17 10:20 AM  Result Value Ref Range   ISSUE DATE / TIME 032122482500    Blood Product Unit Number [id number]    PRODUCT CODE E0336V00    Unit Type and Rh 7300    Blood Product Expiration Date 038882800349    ISSUE DATE / TIME [id number]    Blood Product Unit Number [id number]    PRODUCT CODE B8675Q49    Unit Type and Rh 7300    Blood Product Expiration Date [id number]   POC occult blood, ED     Status: None   Collection Time: 08/25/17 11:55 AM  Result Value Ref Range   Fecal Occult Bld NEGATIVE NEGATIVE  Prepare RBC     Status: None   Collection Time: 08/25/17 12:00  PM  Result Value Ref Range   Order Confirmation      ORDER PROCESSED BY BLOOD BANK Performed at [place] [address], [place], [place] [address]   Urinalysis, Routine w reflex microscopic     Status: Abnormal   Collection Time: 08/25/17  2:04 PM  Result Value Ref Range   Color, Urine YELLOW YELLOW   APPearance CLEAR CLEAR   Specific Gravity, Urine 1.008 1.005 - 1.030   pH 5.0 5.0 - 8.0   Glucose, UA NEGATIVE NEGATIVE mg/dL   Hgb urine dipstick NEGATIVE NEGATIVE   Bilirubin Urine NEGATIVE NEGATIVE   Ketones, ur NEGATIVE NEGATIVE mg/dL   Protein, ur NEGATIVE NEGATIVE mg/dL   Nitrite POSITIVE (A) NEGATIVE   Leukocytes, UA TRACE (A) NEGATIVE   RBC / HPF 0-5 0 - 5  RBC/hpf   WBC, UA 0-5 0 - 5 WBC/hpf   Bacteria, UA RARE (A) NONE SEEN   Squamous Epithelial / LPF 0-5 (A) NONE SEEN   Mucus PRESENT     Comment: Performed at [facility], [place] [address], [place], [place] [address]  HIV antibody (Routine Testing)     Status: None   Collection Time: 08/25/17  8:54 PM  Result Value Ref Range   HIV Screen 4th Generation wRfx Non Reactive Non Reactive    Comment: (NOTE) Performed At: [facility] [place], [place] [id number] Rush Farmer MD [id number] Performed at [facility], [place] [address], [place], [place] [address]   Reticulocytes     Status: Abnormal   Collection Time: 08/25/17  8:54 PM  Result Value Ref Range   Retic Ct Pct 2.6 0.4 - 3.1 %   RBC. 3.30 (L) 3.87 - 5.11 MIL/uL   Retic Count, Absolute 85.8 19.0 - 186.0 K/uL    Comment: Performed at [facility], [place] [address], [place], [place] [address]  Vitamin B12     Status: None   Collection Time: 08/25/17  8:54 PM  Result Value Ref Range   Vitamin B-12 194 180 - 914 pg/mL    Comment: (NOTE) This assay is not validated for testing neonatal or myeloproliferative syndrome specimens for Vitamin B12 levels. Performed at [facility], [place] [address], [place], [place] [address]   Folate RBC     Status: Abnormal   Collection Time: 08/25/17  8:54 PM  Result Value Ref Range   Folate, Hemolysate 333.9 Not Estab. ng/mL   Hematocrit 26.2 (L) 34.0 - 46.6 %   Folate, RBC 1,274 >498 ng/mL    Comment: (NOTE) Performed At: [facility] [place], [place] [id number] Rush Farmer MD [id number] Performed at [facility], [place] [address], [place], [place] [address]   Iron and TIBC     Status: Abnormal   Collection Time: 08/25/17  8:54 PM  Result Value Ref Range   Iron 24 (L) 28 - 170 ug/dL   TIBC 484 (H) 250 - 450 ug/dL   Saturation Ratios 5 (L) 10.4 - 31.8 %   UIBC  460 ug/dL    Comment: Performed at [facility], [place] [address], [place], [place] [address]  Ferritin     Status: Abnormal   Collection Time: 08/25/17  8:54 PM  Result Value Ref Range   Ferritin 6 (L) 11 - 307 ng/mL    Comment: Performed at [facility], [place] [address] [place], [place] [address]  Save smear  Status: None   Collection Time: 08/25/17  8:54 PM  Result Value Ref Range   Smear Review SMEAR STAINED AND AVAILABLE FOR REVIEW     Comment: Performed at [facility], [place] [address] [place], [place] [address]  Lactate dehydrogenase     Status: None   Collection Time: 08/25/17  8:54 PM  Result Value Ref Range   LDH 153 98 - 192 U/L    Comment: Performed at [facility], [place] [address] [place], [place] [address]  Direct antiglobulin test (not at [facility])     Status: None   Collection Time: 08/25/17  8:54 PM  Result Value Ref Range   DAT, complement NEG    DAT, IgG      NEG Performed at [facility], [place] [address] [place], [place] [address]   Haptoglobin     Status: None   Collection Time: 08/25/17  8:54 PM  Result Value Ref Range   Haptoglobin 112 34 - 200 mg/dL    Comment: (NOTE) Performed At: [facility] [place], [place] [id number] Rush Farmer MD [id number] Performed at [facility], [place] [address], [place], [place] [address]   Basic metabolic panel     Status: Abnormal   Collection Time: 08/26/17  4:54 AM  Result Value Ref Range   Sodium 138 135 - 145 mmol/L   Potassium 4.7 3.5 - 5.1 mmol/L   Chloride 107 101 - 111 mmol/L   CO2 22 22 - 32 mmol/L   Glucose, Bld 85 65 - 99 mg/dL   BUN 21 (H) 6 - 20 mg/dL   Creatinine, Ser 0.89 0.44 - 1.00 mg/dL   Calcium 8.8 (L) 8.9 - 10.3 mg/dL   GFR calc non Af Amer >60 >60 mL/min   GFR calc Af Amer >60 >60 mL/min    Comment: (NOTE) The eGFR has been calculated using the CKD EPI equation. This calculation  has not been validated in all clinical situations. eGFR's persistently <60 mL/min signify possible Chronic Kidney Disease.    Anion gap 9 5 - 15    Comment: Performed at [facility], [place] [address] [place], [place] [address]  CBC     Status: Abnormal   Collection Time: 08/26/17  4:54 AM  Result Value Ref Range   WBC 8.2 4.0 - 10.5 K/uL   RBC 3.22 (L) 3.87 - 5.11 MIL/uL   Hemoglobin 7.2 (L) 12.0 - 15.0 g/dL   HCT 24.8 (L) 36.0 - 46.0 %   MCV 77.0 (L) 78.0 - 100.0 fL   MCH 22.4 (L) 26.0 - 34.0 pg   MCHC 29.0 (L) 30.0 - 36.0 g/dL   RDW 20.1 (H) 11.5 - 15.5 %   Platelets 249 150 - 400 K/uL    Comment: Performed at [facility], [place] [address] [place], [place] [address]  Glucose, capillary     Status: None   Collection Time: 08/26/17  7:28 AM  Result Value Ref Range   Glucose-Capillary 98 65 - 99 mg/dL  ECHOCARDIOGRAM COMPLETE     Status: None   Collection Time: 08/26/17  9:34 AM  Result Value Ref Range   Weight 3,679.04 oz   Height 65 in   BP 109/47 mmHg  CBC     Status: Abnormal   Collection Time: 08/27/17 12:06 AM  Result Value Ref Range   WBC 9.3 4.0 - 10.5 K/uL   RBC 3.55 (L) 3.87 - 5.11  MIL/uL   Hemoglobin 7.6 (L) 12.0 - 15.0 g/dL   HCT 27.4 (L) 36.0 - 46.0 %   MCV 77.2 (L) 78.0 - 100.0 fL   MCH 21.4 (L) 26.0 - 34.0 pg   MCHC 27.7 (L) 30.0 - 36.0 g/dL   RDW 20.0 (H) 11.5 - 15.5 %   Platelets 238 150 - 400 K/uL    Comment: Performed at [facility], [place] [address] [place], [place] [address]  Prepare RBC     Status: None   Collection Time: 08/27/17  9:04 AM  Result Value Ref Range   Order Confirmation      ORDER PROCESSED BY BLOOD BANK Performed at [facility], [place] [address] [place], [place] [address]   Hemoglobin and hematocrit, blood     Status: Abnormal   Collection Time: 08/27/17  4:53 PM  Result Value Ref Range   Hemoglobin 9.0 (L) 12.0 - 15.0 g/dL   HCT 31.7 (L) 36.0 - 46.0 %     Comment: Performed at [facility], [place] [address], [place],  [address]  CBC with Differential/Platelet     Status: Abnormal   Collection Time: 09/23/17 10:25 AM  Result Value Ref Range   WBC 6.8 3.4 - 10.8 x10E3/uL   RBC 3.93 3.77 - 5.28 x10E6/uL   Hemoglobin 9.1 (L) 11.1 - 15.9 g/dL   Hematocrit 31.5 (L) 34.0 - 46.6 %   MCV 80 79 - 97 fL   MCH 23.2 (L) 26.6 - 33.0 pg   MCHC 28.9 (L) 31.5 - 35.7 g/dL   RDW 21.8 (H) 12.3 - 15.4 %   Platelets 267 150 - 379 x10E3/uL   Neutrophils 55 Not Estab. %   Lymphs 27 Not Estab. %   Monocytes 12 Not Estab. %   Eos 5 Not Estab. %   Basos 1 Not Estab. %   Neutrophils Absolute 3.8 1.4 - 7.0 x10E3/uL   Lymphocytes Absolute 1.8 0.7 - 3.1 x10E3/uL   Monocytes Absolute 0.8 0.1 - 0.9 x10E3/uL   EOS (ABSOLUTE) 0.3 0.0 - 0.4 x10E3/uL   Basophils Absolute 0.1 0.0 - 0.2 x10E3/uL   Immature Granulocytes 0 Not Estab. %   Immature Grans (Abs) 0.0 0.0 - 0.1 x10E3/uL  BMP8+EGFR     Status: None   Collection Time: 09/23/17 10:25 AM  Result Value Ref Range   Glucose 83 65 - 99 mg/dL   BUN 13 8 - 27 mg/dL   Creatinine, Ser 0.88 0.57 - 1.00 mg/dL   GFR calc non Af Amer 69 >59 mL/min/1.73   GFR calc Af Amer 80 >59 mL/min/1.73   BUN/Creatinine Ratio 15 12 - 28   Sodium 142 134 - 144 mmol/L   Potassium 4.5 3.5 - 5.2 mmol/L   Chloride 101 96 - 106 mmol/L   CO2 26 20 - 29 mmol/L   Calcium 9.2 8.7 - 10.3 mg/dL  Anemia Profile B     Status: Abnormal   Collection Time: 10/24/17  8:51 AM  Result Value Ref Range   Total Iron Binding Capacity 463 (H) 250 - 450 ug/dL   UIBC 446 (H) 118 - 369 ug/dL   Iron 17 (L) 27 - 139 ug/dL   Iron Saturation 4 (LL) 15 - 55 %   Ferritin 11 (L) 15 - 150 ng/mL   Vitamin B-12 286 232 - 1,245 pg/mL   Folate 3.6 >3.0 ng/mL    Comment: A serum folate concentration of less than 3.1 ng/mL is  considered to represent clinical deficiency.    WBC 7.0 3.4 - 10.8 x10E3/uL   RBC 4.17 3.77 - 5.28 x10E6/uL    Hemoglobin 9.7 (L) 11.1 - 15.9 g/dL   Hematocrit 33.1 (L) 34.0 - 46.6 %   MCV 79 79 - 97 fL   MCH 23.3 (L) 26.6 - 33.0 pg   MCHC 29.3 (L) 31.5 - 35.7 g/dL   RDW 18.9 (H) 12.3 - 15.4 %   Platelets 342 150 - 379 x10E3/uL   Neutrophils 54 Not Estab. %   Lymphs 29 Not Estab. %   Monocytes 11 Not Estab. %   Eos 6 Not Estab. %   Basos 0 Not Estab. %   Neutrophils Absolute 3.8 1.4 - 7.0 x10E3/uL   Lymphocytes Absolute 2.0 0.7 - 3.1 x10E3/uL   Monocytes Absolute 0.8 0.1 - 0.9 x10E3/uL   EOS (ABSOLUTE) 0.4 0.0 - 0.4 x10E3/uL   Basophils Absolute 0.0 0.0 - 0.2 x10E3/uL   Immature Granulocytes 0 Not Estab. %   Immature Grans (Abs) 0.0 0.0 - 0.1 x10E3/uL   Retic Ct Pct 1.3 0.6 - 2.6 %  Basic metabolic panel     Status: Abnormal   Collection Time: 10/28/17  3:45 PM  Result Value Ref Range   Sodium 135 135 - 145 mmol/L   Potassium 4.1 3.5 - 5.1 mmol/L   Chloride 99 (L) 101 - 111 mmol/L   CO2 23 22 - 32 mmol/L   Glucose, Bld 112 (H) 65 - 99 mg/dL   BUN 30 (H) 6 - 20 mg/dL   Creatinine, Ser 1.83 (H) 0.44 - 1.00 mg/dL   Calcium 8.5 (L) 8.9 - 10.3 mg/dL   GFR calc non Af Amer 28 (L) >60 mL/min   GFR calc Af Amer 32 (L) >60 mL/min    Comment: (NOTE) The eGFR has been calculated using the CKD EPI equation. This calculation has not been validated in all clinical situations. eGFR's persistently <60 mL/min signify possible Chronic Kidney Disease.    Anion gap 13 5 - 15    Comment: Performed at [facility], [address] [place], [place] [address]  CBC     Status: Abnormal   Collection Time: 10/28/17  3:45 PM  Result Value Ref Range   WBC 11.7 (H) 4.0 - 10.5 K/uL   RBC 4.22 3.87 - 5.11 MIL/uL   Hemoglobin 9.6 (L) 12.0 - 15.0 g/dL   HCT 34.8 (L) 36.0 - 46.0 %   MCV 82.5 78.0 - 100.0 fL   MCH 22.7 (L) 26.0 - 34.0 pg   MCHC 27.6 (L) 30.0 - 36.0 g/dL   RDW 18.5 (H) 11.5 - 15.5 %   Platelets 334 150 - 400 K/uL    Comment: Performed at [facility], [address], [place], [place] [address]   Troponin I     Status: None   Collection Time: 10/28/17  3:45 PM  Result Value Ref Range   Troponin I <0.03 <0.03 ng/mL    Comment: Performed at [facility], [address], [place], [place] [address]  Brain natriuretic peptide     Status: None   Collection Time: 10/28/17  4:57 PM  Result Value Ref Range   B Natriuretic Peptide 53.0 0.0 - 100.0 pg/mL    Comment: Performed at [facility], [address] [place], [place] [address]  Lactic acid, plasma     Status: None   Collection Time: 10/28/17  4:57 PM  Result Value Ref Range   Lactic Acid, Venous  1.4 0.5 - 1.9 mmol/L    Comment: Performed at [facility], [address], [place], [place] [address]  Lactic acid, plasma     Status: None   Collection Time: 10/28/17  7:41 PM  Result Value Ref Range   Lactic Acid, Venous 1.3 0.5 - 1.9 mmol/L    Comment: Performed at [facility], [address] [place], [place] [address]  Urine culture     Status: Abnormal   Collection Time: 10/28/17  8:32 PM  Result Value Ref Range   Specimen Description      URINE, CATHETERIZED Performed at [facility], [address] [place], [place] [address]    Special Requests      NONE Performed at [facility], [address] [place], [place] [address]    Culture >=100,000 COLONIES/mL KLEBSIELLA PNEUMONIAE (A)    Report Status 10/31/2017 FINAL    Organism ID, Bacteria KLEBSIELLA PNEUMONIAE (A)       Susceptibility   Klebsiella pneumoniae - MIC*    AMPICILLIN >=32 RESISTANT Resistant     CEFAZOLIN <=4 SENSITIVE Sensitive     CEFTRIAXONE <=1 SENSITIVE Sensitive     CIPROFLOXACIN <=0.25 SENSITIVE Sensitive     GENTAMICIN <=1 SENSITIVE Sensitive     IMIPENEM <=0.25 SENSITIVE Sensitive     NITROFURANTOIN 64 INTERMEDIATE Intermediate     TRIMETH/SULFA <=20 SENSITIVE Sensitive     AMPICILLIN/SULBACTAM 4 SENSITIVE Sensitive     PIP/TAZO <=4 SENSITIVE Sensitive     Extended ESBL NEGATIVE Sensitive     * >=100,000 COLONIES/mL KLEBSIELLA PNEUMONIAE   Creatinine, urine, random     Status: None   Collection Time: 10/28/17  8:32 PM  Result Value Ref Range   Creatinine, Urine 94.05 mg/dL    Comment: Performed at [facility], [address], [place], [place] [address]  Urea nitrogen, urine     Status: None   Collection Time: 10/28/17  8:32 PM  Result Value Ref Range   Urea Nitrogen, Ur 180 Not Estab. mg/dL    Comment: (NOTE) Performed At: [facility] [place], [place] [id number] Rush Farmer MD [id number] Performed at [facility], [address] [place], [place] [address]   Sodium, urine, random     Status: None   Collection Time: 10/28/17  8:32 PM  Result Value Ref Range   Sodium, Ur 50 mmol/L    Comment: Performed at [facility], [address], [place], [place] [address]  Urinalysis, Routine w reflex microscopic     Status: Abnormal   Collection Time: 10/28/17  9:00 PM  Result Value Ref Range   Color, Urine YELLOW YELLOW   APPearance HAZY (A) CLEAR   Specific Gravity, Urine 1.014 1.005 - 1.030   pH 5.0 5.0 - 8.0   Glucose, UA NEGATIVE NEGATIVE mg/dL   Hgb urine dipstick NEGATIVE NEGATIVE   Bilirubin Urine NEGATIVE NEGATIVE   Ketones, ur NEGATIVE NEGATIVE mg/dL   Protein, ur NEGATIVE NEGATIVE mg/dL   Nitrite POSITIVE (A) NEGATIVE   Leukocytes, UA MODERATE (A) NEGATIVE   RBC / HPF 0-5 0 - 5 RBC/hpf   WBC, UA TOO NUMEROUS TO COUNT 0 - 5 WBC/hpf   Bacteria, UA RARE (A) NONE SEEN   Squamous Epithelial / LPF NONE SEEN NONE SEEN   WBC Clumps PRESENT    Hyaline Casts, UA PRESENT     Comment: Performed at [facility], [address], [place], [place] [address]  Culture, blood (x 2)     Status: None  Collection Time: 10/28/17 11:00 PM  Result Value Ref Range   Specimen Description A-LINE    Special Requests      BOTTLES DRAWN AEROBIC AND ANAEROBIC Blood Culture adequate volume DRAWN BY RN   Culture      NO GROWTH 5 DAYS Performed at [facility], [address], [place], [place] [address]     Report Status 11/02/2017 FINAL   Troponin I     Status: None   Collection Time: 10/28/17 11:00 PM  Result Value Ref Range   Troponin I <0.03 <0.03 ng/mL    Comment: Performed at [facility], [address] [place], [place] [address]  Culture, blood (x 2)     Status: None   Collection Time: 10/28/17 11:19 PM  Result Value Ref Range   Specimen Description BLOOD LEFT WRIST    Special Requests      BOTTLES DRAWN AEROBIC ONLY Blood Culture adequate volume   Culture      NO GROWTH 5 DAYS Performed at [facility], [address], [place], [place] [address]    Report Status 11/02/2017 FINAL   MRSA PCR Screening     Status: Abnormal   Collection Time: 10/28/17 11:50 PM  Result Value Ref Range   MRSA by PCR POSITIVE (A) NEGATIVE    Comment:        The GeneXpert MRSA Assay (FDA approved for NASAL specimens only), is one component of a comprehensive MRSA colonization surveillance program. It is not intended to diagnose MRSA infection nor to guide or monitor treatment for MRSA infections. RESULT CALLED TO, READ BACK BY AND VERIFIED WITH: GAMMON,S'@[email or web address]'  BY MATTHEWS, B 4.20.19 Performed at [facility], [address], [place], [place] [address]   Comprehensive metabolic panel     Status: Abnormal   Collection Time: 10/29/17  4:13 AM  Result Value Ref Range   Sodium 139 135 - 145 mmol/L   Potassium 4.6 3.5 - 5.1 mmol/L   Chloride 108 101 - 111 mmol/L   CO2 22 22 - 32 mmol/L   Glucose, Bld 85 65 - 99 mg/dL   BUN 24 (H) 6 - 20 mg/dL   Creatinine, Ser 1.05 (H) 0.44 - 1.00 mg/dL   Calcium 7.6 (L) 8.9 - 10.3 mg/dL   Total Protein 5.8 (L) 6.5 - 8.1 g/dL   Albumin 2.8 (L) 3.5 - 5.0 g/dL   AST 19 15 - 41 U/L   ALT 13 (L) 14 - 54 U/L   Alkaline Phosphatase 87 38 - 126 U/L   Total Bilirubin 0.3 0.3 - 1.2 mg/dL   GFR calc non Af Amer 55 (L) >60 mL/min   GFR calc Af Amer >60 >60 mL/min    Comment: (NOTE) The eGFR has been calculated using the CKD EPI equation. This calculation has not  been validated in all clinical situations. eGFR's persistently <60 mL/min signify possible Chronic Kidney Disease.    Anion gap 9 5 - 15    Comment: Performed at [facility], [address] [place], [place] [address]  CBC WITH DIFFERENTIAL     Status: Abnormal   Collection Time: 10/29/17  4:13 AM  Result Value Ref Range   WBC 8.6 4.0 - 10.5 K/uL   RBC 3.68 (L) 3.87 - 5.11 MIL/uL   Hemoglobin 8.5 (L) 12.0 - 15.0 g/dL   HCT 31.0 (L) 36.0 - 46.0 %   MCV 84.2 78.0 - 100.0 fL   MCH 23.1 (L) 26.0 - 34.0 pg   MCHC 27.4 (L)  30.0 - 36.0 g/dL   RDW 18.6 (H) 11.5 - 15.5 %   Platelets 273 150 - 400 K/uL   Neutrophils Relative % 62 %   Neutro Abs 5.4 1.7 - 7.7 K/uL   Lymphocytes Relative 22 %   Lymphs Abs 1.9 0.7 - 4.0 K/uL   Monocytes Relative 11 %   Monocytes Absolute 0.9 0.1 - 1.0 K/uL   Eosinophils Relative 5 %   Eosinophils Absolute 0.4 0.0 - 0.7 K/uL   Basophils Relative 0 %   Basophils Absolute 0.0 0.0 - 0.1 K/uL    Comment: Performed at [facility], [address], [place], [place] [address]  Troponin I     Status: None   Collection Time: 10/29/17  4:13 AM  Result Value Ref Range   Troponin I <0.03 <0.03 ng/mL    Comment: Performed at [facility], [address], [place], [place] [address]  Glucose, capillary     Status: None   Collection Time: 10/29/17  7:43 AM  Result Value Ref Range   Glucose-Capillary 91 65 - 99 mg/dL  Troponin I     Status: None   Collection Time: 10/29/17 10:14 AM  Result Value Ref Range   Troponin I <0.03 <0.03 ng/mL    Comment: Performed at [facility], [address] [place], [place] [address]  Basic metabolic panel     Status: Abnormal   Collection Time: 10/30/17  4:44 AM  Result Value Ref Range   Sodium 138 135 - 145 mmol/L   Potassium 5.0 3.5 - 5.1 mmol/L   Chloride 109 101 - 111 mmol/L   CO2 22 22 - 32 mmol/L   Glucose, Bld 89 65 - 99 mg/dL   BUN 14 6 - 20 mg/dL   Creatinine, Ser 0.54 0.44 - 1.00 mg/dL   Calcium 8.5 (L) 8.9 - 10.3 mg/dL    GFR calc non Af Amer >60 >60 mL/min   GFR calc Af Amer >60 >60 mL/min    Comment: (NOTE) The eGFR has been calculated using the CKD EPI equation. This calculation has not been validated in all clinical situations. eGFR's persistently <60 mL/min signify possible Chronic Kidney Disease.    Anion gap 7 5 - 15    Comment: Performed at [facility], [address], [place], [place] [address]  CBC     Status: Abnormal   Collection Time: 10/30/17  4:44 AM  Result Value Ref Range   WBC 6.0 4.0 - 10.5 K/uL   RBC 3.54 (L) 3.87 - 5.11 MIL/uL   Hemoglobin 8.2 (L) 12.0 - 15.0 g/dL   HCT 29.7 (L) 36.0 - 46.0 %   MCV 83.9 78.0 - 100.0 fL   MCH 23.2 (L) 26.0 - 34.0 pg   MCHC 27.6 (L) 30.0 - 36.0 g/dL   RDW 18.0 (H) 11.5 - 15.5 %   Platelets 260 150 - 400 K/uL    Comment: Performed at [facility], [address], [place],  [address]  Glucose, capillary     Status: Abnormal   Collection Time: 10/30/17  7:51 AM  Result Value Ref Range   Glucose-Capillary 101 (H) 65 - 99 mg/dL  Basic metabolic panel     Status: None   Collection Time: 10/31/17  6:07 AM  Result Value Ref Range   Sodium 138 135 - 145 mmol/L   Potassium 4.7 3.5 - 5.1 mmol/L   Chloride 103 101 - 111 mmol/L   CO2 27 22 - 32 mmol/L   Glucose,  Bld 99 65 - 99 mg/dL   BUN 9 6 - 20 mg/dL   Creatinine, Ser 0.55 0.44 - 1.00 mg/dL   Calcium 9.5 8.9 - 10.3 mg/dL   GFR calc non Af Amer >60 >60 mL/min   GFR calc Af Amer >60 >60 mL/min    Comment: (NOTE) The eGFR has been calculated using the CKD EPI equation. This calculation has not been validated in all clinical situations. eGFR's persistently <60 mL/min signify possible Chronic Kidney Disease.    Anion gap 8 5 - 15    Comment: Performed at [facility], [address], [place], [place] [address]  CBC     Status: Abnormal   Collection Time: 10/31/17  6:07 AM  Result Value Ref Range   WBC 5.7 4.0 - 10.5 K/uL   RBC 3.83 (L) 3.87 - 5.11 MIL/uL   Hemoglobin 9.0 (L) 12.0 - 15.0  g/dL   HCT 31.7 (L) 36.0 - 46.0 %   MCV 82.8 78.0 - 100.0 fL   MCH 23.5 (L) 26.0 - 34.0 pg   MCHC 28.4 (L) 30.0 - 36.0 g/dL   RDW 17.2 (H) 11.5 - 15.5 %   Platelets 254 150 - 400 K/uL    Comment: Performed at [facility], [address], [place], [place] [address]  Glucose, capillary     Status: None   Collection Time: 10/31/17  7:26 AM  Result Value Ref Range   Glucose-Capillary 97 65 - 99 mg/dL  CBC with Differential/Platelet     Status: Abnormal   Collection Time: 11/16/17  1:03 PM  Result Value Ref Range   WBC 8.6 4.0 - 10.5 K/uL   RBC 4.13 3.87 - 5.11 MIL/uL   Hemoglobin 10.1 (L) 12.0 - 15.0 g/dL   HCT 35.7 (L) 36.0 - 46.0 %   MCV 86.4 78.0 - 100.0 fL   MCH 24.5 (L) 26.0 - 34.0 pg   MCHC 28.3 (L) 30.0 - 36.0 g/dL   RDW 21.2 (H) 11.5 - 15.5 %   Platelets 332 150 - 400 K/uL   Neutrophils Relative % 74 %   Lymphocytes Relative 17 %   Monocytes Relative 6 %   Eosinophils Relative 3 %   Basophils Relative 0 %   Neutro Abs 6.3 1.7 - 7.7 K/uL   Lymphs Abs 1.5 0.7 - 4.0 K/uL   Monocytes Absolute 0.5 0.1 - 1.0 K/uL   Eosinophils Absolute 0.3 0.0 - 0.7 K/uL   Basophils Absolute 0.0 0.0 - 0.1 K/uL   RBC Morphology ANISOCYTES     Comment: MICROCYTES Performed at [facility], [address], [place], [place] [address]   Comprehensive metabolic panel     Status: Abnormal   Collection Time: 11/16/17  1:03 PM  Result Value Ref Range   Sodium 137 135 - 145 mmol/L   Potassium 4.0 3.5 - 5.1 mmol/L   Chloride 104 101 - 111 mmol/L   CO2 25 22 - 32 mmol/L   Glucose, Bld 90 65 - 99 mg/dL   BUN 31 (H) 6 - 20 mg/dL   Creatinine, Ser 0.91 0.44 - 1.00 mg/dL   Calcium 9.0 8.9 - 10.3 mg/dL   Total Protein 6.9 6.5 - 8.1 g/dL   Albumin 3.5 3.5 - 5.0 g/dL   AST 20 15 - 41 U/L   ALT 15 14 - 54 U/L   Alkaline Phosphatase 94 38 - 126 U/L   Total Bilirubin 0.5 0.3 - 1.2 mg/dL   GFR calc non Af Amer >  60 >60 mL/min   GFR calc Af Amer >60 >60 mL/min    Comment: (NOTE) The eGFR has been  calculated using the CKD EPI equation. This calculation has not been validated in all clinical situations. eGFR's persistently <60 mL/min signify possible Chronic Kidney Disease.    Anion gap 8 5 - 15    Comment: Performed at [facility], [address], [place], [place] [address]  Reticulocytes     Status: Abnormal   Collection Time: 11/16/17  1:03 PM  Result Value Ref Range   Retic Ct Pct 3.9 (H) 0.4 - 3.1 %   RBC. 4.13 3.87 - 5.11 MIL/uL   Retic Count, Absolute 161.1 19.0 - 186.0 K/uL    Comment: Performed at [facility], [address], [place], [place] [address]  TSH     Status: None   Collection Time: 11/16/17  1:03 PM  Result Value Ref Range   TSH 0.854 0.350 - 4.500 uIU/mL    Comment: Performed by a 3rd Generation assay with a functional sensitivity of <=0.01 uIU/mL. Performed at [facility], [address], [place], [place] [address]     RADIOGRAPHIC STUDIES: I have personally reviewed the radiological images as listed and agreed with the findings in the report. Dg Chest 2 View  Result Date: 10/24/2017 CLINICAL DATA:  Reported soft tissue prominence anterior chest wall EXAM: CHEST - 2 VIEW COMPARISON:  August 25, 2017 FINDINGS: There is no edema or consolidation. Heart is upper normal in size with pulmonary vascularity within normal limits. No adenopathy. There is aortic atherosclerosis. Thoracic stimulator tips are in the midthoracic region. There is postoperative change in the lower cervical spine. There is postoperative change in the neck region to the right and left of midline. No bony lesions are evident. No soft tissue masses appreciable by radiography. IMPRESSION: No evident soft tissue mass appreciable by radiography. No edema or consolidation. Heart upper normal in size. There is aortic atherosclerosis. No adenopathy evident. If there remains concern for soft tissue lesion, chest CT, ideally with intravenous contrast, would be the imaging study of choice for further  assessment. Aortic Atherosclerosis (ICD10-I70.0). Electronically Signed   By: Lowella Grip III M.D.   On: 10/24/2017 08:57   Ct Chest Wo Contrast  Result Date: 10/28/2017 CLINICAL DATA:  Chest pressure beginning yesterday with dyspnea. EXAM: CT CHEST WITHOUT CONTRAST TECHNIQUE: Multidetector CT imaging of the chest was performed following the standard protocol without IV contrast. COMPARISON:  Same day CXR FINDINGS: Cardiovascular: Moderate atherosclerosis at the origins of the great vessels with normal branch pattern. Moderate atherosclerosis of the thoracic aorta without aneurysm. Mild dilatation of the main pulmonary artery to 3 cm. Left main and three-vessel coronary arteriosclerosis. Cardiomegaly without pericardial effusion. Mediastinum/Nodes: Mild diffuse thickening of the mid to distal esophagus query esophagitis. Small hiatal hernia. No mediastinal adenopathy. No definite hilar adenopathy though assessment is limited by lack of IV contrast. There is no axillary adenopathy. Trachea and mainstem bronchi are patent. Lungs/Pleura: Mild peribronchial thickening with faint bilateral multilobar ground-glass opacities likely representing areas of alveolitis or pneumonitis versus hypoventilatory change. There is a nodule in the medial right middle lobe measuring approximately 5.2 mm in average, series 4 image 74. No effusion or pneumothorax. There is bibasilar atelectasis. Upper Abdomen: No acute abnormality. Musculoskeletal: ACDF of the included lower cervical spine. Neural stimulator device projects at the midthoracic spine to the T8 level. IMPRESSION: 1. Cardiomegaly with moderate left main and three-vessel coronary arteriosclerosis. 2. Moderate atherosclerosis of the  thoracic aorta without aneurysm. 3. Mild peribronchial thickening and ground-glass opacities bilaterally likely reflecting bronchitic change, alveolitis and/or pneumonitis. Hypoventilatory change may also contribute to some of the  ground-glass appearance. 4. Mild diffuse thickening of the esophagus query esophagitis. Small hiatal hernia. 5. Nodule in the right middle lobe averaging 5.2 mm. Initial follow-up with CT at 6-12 months is recommended to confirm persistence. If persistent, repeat CT is recommended every 2 years until 5 years of stability has been established. This recommendation follows the consensus statement: Guidelines for Management of Incidental Pulmonary Nodules Detected on CT Images: From the Fleischner Society 2017; Radiology 2017; 284:228-243. Aortic Atherosclerosis (ICD10-I70.0). Electronically Signed   By: Ashley Royalty M.D.   On: 10/28/2017 20:29   Dg Chest Port 1 View  Result Date: 10/28/2017 CLINICAL DATA:  Chest pain EXAM: PORTABLE CHEST 1 VIEW COMPARISON:  10/24/2017 FINDINGS: Cardiac shadow remains enlarged. Aortic calcifications are again noted. Postsurgical changes are again seen and stable. The lungs are well aerated bilaterally. No focal infiltrate or sizable effusion is seen. No acute bony abnormality is noted. IMPRESSION: No active disease. Electronically Signed   By: [name] M.D.   On: 10/28/2017 16:51    ASSESSMENT & PLAN:  Iron deficiency anemia 1.  Normocytic anemia: - Recent admission with 2 units of blood transfusion.  Patient has been on oral iron supplements for a long time.  Currently taking 1 tablet 3 times a day.  Stool was tested negative for occult blood on 08/25/2017.  Reportedly had a colonoscopy 3 years ago which was normal.  This was done at an outside facility.  Iron panel and ferritin during last admission shows iron deficiency anemia.  Patient received parenteral iron once in the hospital.  Vitamin L07 and folic acid were borderline.  Patient taking vitamin B12 1 tablet once a month. - I have recommended parenteral iron therapy weekly x2.  I will obtain baseline labs today and repeat them in 6 weeks.  We will see her back in 6 weeks to discuss the results and see the  improvement in hemoglobin after parenteral iron.  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  She will likely need maintenance iron infusions.     All questions were answered. The patient knows to call the clinic with any problems, questions or concerns.   This note includes documentation from Mike Craze, NP, who was present during this patient's office visit and evaluation.  I have reviewed this note for its completeness and accuracy.  I have edited this note accordingly based on my findings and medical opinion.      Derek Jack, MD 11/16/17 9:20 PM

## 2017-11-17 LAB — PROTEIN ELECTROPHORESIS, SERUM
A/G Ratio: 1 (ref 0.7–1.7)
ALBUMIN ELP: 3.3 g/dL (ref 2.9–4.4)
ALPHA-2-GLOBULIN: 0.8 g/dL (ref 0.4–1.0)
Alpha-1-Globulin: 0.3 g/dL (ref 0.0–0.4)
Beta Globulin: 1.1 g/dL (ref 0.7–1.3)
GLOBULIN, TOTAL: 3.3 g/dL (ref 2.2–3.9)
Gamma Globulin: 1.1 g/dL (ref 0.4–1.8)
TOTAL PROTEIN ELP: 6.6 g/dL (ref 6.0–8.5)

## 2017-11-17 LAB — KAPPA/LAMBDA LIGHT CHAINS
KAPPA FREE LGHT CHN: 35.9 mg/L — AB (ref 3.3–19.4)
Kappa, lambda light chain ratio: 0.9 (ref 0.26–1.65)
LAMDA FREE LIGHT CHAINS: 39.8 mg/L — AB (ref 5.7–26.3)

## 2017-11-21 ENCOUNTER — Other Ambulatory Visit: Payer: Self-pay

## 2017-11-21 ENCOUNTER — Inpatient Hospital Stay (HOSPITAL_COMMUNITY): Payer: Medicare PPO

## 2017-11-21 ENCOUNTER — Encounter (HOSPITAL_COMMUNITY): Payer: Self-pay

## 2017-11-21 VITALS — BP 127/87 | HR 76 | Temp 98.1°F | Resp 18

## 2017-11-21 DIAGNOSIS — I130 Hypertensive heart and chronic kidney disease with heart failure and stage 1 through stage 4 chronic kidney disease, or unspecified chronic kidney disease: Secondary | ICD-10-CM | POA: Diagnosis not present

## 2017-11-21 DIAGNOSIS — E119 Type 2 diabetes mellitus without complications: Secondary | ICD-10-CM | POA: Diagnosis not present

## 2017-11-21 DIAGNOSIS — D649 Anemia, unspecified: Secondary | ICD-10-CM | POA: Diagnosis not present

## 2017-11-21 DIAGNOSIS — D508 Other iron deficiency anemias: Secondary | ICD-10-CM

## 2017-11-21 DIAGNOSIS — H539 Unspecified visual disturbance: Secondary | ICD-10-CM | POA: Diagnosis not present

## 2017-11-21 DIAGNOSIS — N189 Chronic kidney disease, unspecified: Secondary | ICD-10-CM | POA: Diagnosis not present

## 2017-11-21 DIAGNOSIS — J449 Chronic obstructive pulmonary disease, unspecified: Secondary | ICD-10-CM | POA: Diagnosis not present

## 2017-11-21 DIAGNOSIS — R5382 Chronic fatigue, unspecified: Secondary | ICD-10-CM | POA: Diagnosis not present

## 2017-11-21 DIAGNOSIS — R51 Headache: Secondary | ICD-10-CM | POA: Diagnosis not present

## 2017-11-21 DIAGNOSIS — E785 Hyperlipidemia, unspecified: Secondary | ICD-10-CM | POA: Diagnosis not present

## 2017-11-21 MED ORDER — SODIUM CHLORIDE 0.9 % IV SOLN
510.0000 mg | Freq: Once | INTRAVENOUS | Status: AC
  Administered 2017-11-21: 510 mg via INTRAVENOUS
  Filled 2017-11-21: qty 17

## 2017-11-21 MED ORDER — SODIUM CHLORIDE 0.9% FLUSH
3.0000 mL | Freq: Once | INTRAVENOUS | Status: DC | PRN
Start: 2017-11-21 — End: 2017-11-21

## 2017-11-21 MED ORDER — SODIUM CHLORIDE 0.9 % IV SOLN
Freq: Once | INTRAVENOUS | Status: AC
  Administered 2017-11-21: 12:00:00 via INTRAVENOUS

## 2017-11-21 MED ORDER — CYANOCOBALAMIN 1000 MCG/ML IJ SOLN
1000.0000 ug | Freq: Once | INTRAMUSCULAR | Status: AC
  Administered 2017-11-21: 1000 ug via INTRAMUSCULAR
  Filled 2017-11-21: qty 1

## 2017-11-21 NOTE — Progress Notes (Signed)
Tolerated infusion w/o adverse reaction.  Alert, in no distress.  VSS.  Discharged via wheelchair in c/o significant other.

## 2017-11-28 ENCOUNTER — Inpatient Hospital Stay (HOSPITAL_COMMUNITY): Payer: Medicare PPO

## 2017-11-28 VITALS — BP 118/60 | HR 69 | Temp 98.2°F | Resp 18

## 2017-11-28 DIAGNOSIS — D649 Anemia, unspecified: Secondary | ICD-10-CM | POA: Diagnosis not present

## 2017-11-28 DIAGNOSIS — D508 Other iron deficiency anemias: Secondary | ICD-10-CM

## 2017-11-28 DIAGNOSIS — H539 Unspecified visual disturbance: Secondary | ICD-10-CM | POA: Diagnosis not present

## 2017-11-28 DIAGNOSIS — J449 Chronic obstructive pulmonary disease, unspecified: Secondary | ICD-10-CM | POA: Diagnosis not present

## 2017-11-28 DIAGNOSIS — E785 Hyperlipidemia, unspecified: Secondary | ICD-10-CM | POA: Diagnosis not present

## 2017-11-28 DIAGNOSIS — R5382 Chronic fatigue, unspecified: Secondary | ICD-10-CM | POA: Diagnosis not present

## 2017-11-28 DIAGNOSIS — E119 Type 2 diabetes mellitus without complications: Secondary | ICD-10-CM | POA: Diagnosis not present

## 2017-11-28 DIAGNOSIS — I130 Hypertensive heart and chronic kidney disease with heart failure and stage 1 through stage 4 chronic kidney disease, or unspecified chronic kidney disease: Secondary | ICD-10-CM | POA: Diagnosis not present

## 2017-11-28 DIAGNOSIS — N189 Chronic kidney disease, unspecified: Secondary | ICD-10-CM | POA: Diagnosis not present

## 2017-11-28 DIAGNOSIS — R51 Headache: Secondary | ICD-10-CM | POA: Diagnosis not present

## 2017-11-28 MED ORDER — FERUMOXYTOL INJECTION 510 MG/17 ML
510.0000 mg | Freq: Once | INTRAVENOUS | Status: AC
  Administered 2017-11-28: 510 mg via INTRAVENOUS
  Filled 2017-11-28: qty 17

## 2017-11-28 MED ORDER — SODIUM CHLORIDE 0.9 % IV SOLN
Freq: Once | INTRAVENOUS | Status: AC
  Administered 2017-11-28: 15:00:00 via INTRAVENOUS

## 2017-11-28 NOTE — Progress Notes (Signed)
Tolerated infusion w/o adverse reaction.  Alert, in no distress.  VSS.  Discharged via wheelchair in c/o significant other.

## 2017-11-30 ENCOUNTER — Telehealth: Payer: Self-pay | Admitting: Family Medicine

## 2017-11-30 MED ORDER — ALENDRONATE SODIUM 70 MG PO TABS: 70.0000 mg | ORAL_TABLET | ORAL | 3 refills | Status: DC

## 2017-11-30 NOTE — Telephone Encounter (Signed)
I sent in the refill for the patient.

## 2017-11-30 NOTE — Telephone Encounter (Signed)
Patient aware.

## 2017-11-30 NOTE — Telephone Encounter (Signed)
What is the name of the medication? alendronate  Have you contacted your pharmacy to request a refill? No refills prescribed by previous provider  Which pharmacy would you like this sent to? [facility]   Patient notified that their request is being sent to the clinical staff for review and that they should receive a call once it is complete. If they do not receive a call within 24 hours they can check with their pharmacy or our office.

## 2017-12-07 DIAGNOSIS — M19072 Primary osteoarthritis, left ankle and foot: Secondary | ICD-10-CM | POA: Diagnosis not present

## 2017-12-07 DIAGNOSIS — M25572 Pain in left ankle and joints of left foot: Secondary | ICD-10-CM | POA: Diagnosis not present

## 2017-12-16 DIAGNOSIS — M47812 Spondylosis without myelopathy or radiculopathy, cervical region: Secondary | ICD-10-CM | POA: Diagnosis not present

## 2017-12-16 DIAGNOSIS — M961 Postlaminectomy syndrome, not elsewhere classified: Secondary | ICD-10-CM | POA: Diagnosis not present

## 2017-12-16 DIAGNOSIS — F1120 Opioid dependence, uncomplicated: Secondary | ICD-10-CM | POA: Diagnosis not present

## 2017-12-16 DIAGNOSIS — G894 Chronic pain syndrome: Secondary | ICD-10-CM | POA: Diagnosis not present

## 2017-12-26 ENCOUNTER — Inpatient Hospital Stay (HOSPITAL_COMMUNITY): Payer: Medicare PPO | Attending: Hematology

## 2017-12-26 DIAGNOSIS — I10 Essential (primary) hypertension: Secondary | ICD-10-CM | POA: Diagnosis not present

## 2017-12-26 DIAGNOSIS — R110 Nausea: Secondary | ICD-10-CM | POA: Diagnosis not present

## 2017-12-26 DIAGNOSIS — R5383 Other fatigue: Secondary | ICD-10-CM | POA: Insufficient documentation

## 2017-12-26 DIAGNOSIS — K219 Gastro-esophageal reflux disease without esophagitis: Secondary | ICD-10-CM | POA: Insufficient documentation

## 2017-12-26 DIAGNOSIS — E785 Hyperlipidemia, unspecified: Secondary | ICD-10-CM | POA: Diagnosis not present

## 2017-12-26 DIAGNOSIS — D509 Iron deficiency anemia, unspecified: Secondary | ICD-10-CM | POA: Insufficient documentation

## 2017-12-26 DIAGNOSIS — E119 Type 2 diabetes mellitus without complications: Secondary | ICD-10-CM | POA: Insufficient documentation

## 2017-12-26 DIAGNOSIS — Z79899 Other long term (current) drug therapy: Secondary | ICD-10-CM | POA: Insufficient documentation

## 2017-12-26 DIAGNOSIS — J449 Chronic obstructive pulmonary disease, unspecified: Secondary | ICD-10-CM | POA: Insufficient documentation

## 2017-12-26 DIAGNOSIS — R6883 Chills (without fever): Secondary | ICD-10-CM | POA: Insufficient documentation

## 2017-12-26 DIAGNOSIS — Z9989 Dependence on other enabling machines and devices: Secondary | ICD-10-CM | POA: Insufficient documentation

## 2017-12-26 DIAGNOSIS — F17210 Nicotine dependence, cigarettes, uncomplicated: Secondary | ICD-10-CM | POA: Insufficient documentation

## 2017-12-26 DIAGNOSIS — M1990 Unspecified osteoarthritis, unspecified site: Secondary | ICD-10-CM | POA: Insufficient documentation

## 2017-12-26 DIAGNOSIS — M810 Age-related osteoporosis without current pathological fracture: Secondary | ICD-10-CM | POA: Diagnosis not present

## 2017-12-26 DIAGNOSIS — D649 Anemia, unspecified: Secondary | ICD-10-CM

## 2017-12-26 DIAGNOSIS — G4730 Sleep apnea, unspecified: Secondary | ICD-10-CM | POA: Diagnosis not present

## 2017-12-26 LAB — CBC WITH DIFFERENTIAL/PLATELET
Basophils Absolute: 0 10*3/uL (ref 0.0–0.1)
Basophils Relative: 0 %
EOS ABS: 0.4 10*3/uL (ref 0.0–0.7)
Eosinophils Relative: 4 %
HEMATOCRIT: 37.3 % (ref 36.0–46.0)
HEMOGLOBIN: 11.2 g/dL — AB (ref 12.0–15.0)
LYMPHS ABS: 1.9 10*3/uL (ref 0.7–4.0)
LYMPHS PCT: 17 %
MCH: 27.1 pg (ref 26.0–34.0)
MCHC: 30 g/dL (ref 30.0–36.0)
MCV: 90.3 fL (ref 78.0–100.0)
MONOS PCT: 7 %
Monocytes Absolute: 0.8 10*3/uL (ref 0.1–1.0)
Neutro Abs: 8 10*3/uL — ABNORMAL HIGH (ref 1.7–7.7)
Neutrophils Relative %: 72 %
PLATELETS: 282 10*3/uL (ref 150–400)
RBC: 4.13 MIL/uL (ref 3.87–5.11)
RDW: 21.4 % — ABNORMAL HIGH (ref 11.5–15.5)
WBC: 11.2 10*3/uL — ABNORMAL HIGH (ref 4.0–10.5)

## 2017-12-26 LAB — IRON AND TIBC
Iron: 34 ug/dL (ref 28–170)
SATURATION RATIOS: 17 % (ref 10.4–31.8)
TIBC: 204 ug/dL — AB (ref 250–450)
UIBC: 170 ug/dL

## 2017-12-26 LAB — VITAMIN B12: Vitamin B-12: 704 pg/mL (ref 180–914)

## 2017-12-26 LAB — FOLATE: Folate: 12.4 ng/mL (ref >5.9)

## 2017-12-26 LAB — FERRITIN: Ferritin: 181 ng/mL (ref 11–307)

## 2017-12-27 ENCOUNTER — Other Ambulatory Visit: Payer: Self-pay | Admitting: Family Medicine

## 2017-12-27 NOTE — Telephone Encounter (Signed)
Last seen 12/16/17

## 2017-12-28 ENCOUNTER — Other Ambulatory Visit: Payer: Self-pay

## 2017-12-28 ENCOUNTER — Inpatient Hospital Stay (HOSPITAL_BASED_OUTPATIENT_CLINIC_OR_DEPARTMENT_OTHER): Payer: Medicare PPO | Admitting: Hematology

## 2017-12-28 ENCOUNTER — Encounter (HOSPITAL_COMMUNITY): Payer: Self-pay | Admitting: Hematology

## 2017-12-28 VITALS — BP 138/62 | HR 71 | Temp 98.8°F | Resp 18

## 2017-12-28 DIAGNOSIS — R110 Nausea: Secondary | ICD-10-CM | POA: Diagnosis not present

## 2017-12-28 DIAGNOSIS — R5383 Other fatigue: Secondary | ICD-10-CM | POA: Diagnosis not present

## 2017-12-28 DIAGNOSIS — J449 Chronic obstructive pulmonary disease, unspecified: Secondary | ICD-10-CM | POA: Diagnosis not present

## 2017-12-28 DIAGNOSIS — D508 Other iron deficiency anemias: Secondary | ICD-10-CM

## 2017-12-28 DIAGNOSIS — M810 Age-related osteoporosis without current pathological fracture: Secondary | ICD-10-CM

## 2017-12-28 DIAGNOSIS — G4730 Sleep apnea, unspecified: Secondary | ICD-10-CM

## 2017-12-28 DIAGNOSIS — R6883 Chills (without fever): Secondary | ICD-10-CM

## 2017-12-28 DIAGNOSIS — I10 Essential (primary) hypertension: Secondary | ICD-10-CM | POA: Diagnosis not present

## 2017-12-28 DIAGNOSIS — M1990 Unspecified osteoarthritis, unspecified site: Secondary | ICD-10-CM | POA: Diagnosis not present

## 2017-12-28 DIAGNOSIS — K219 Gastro-esophageal reflux disease without esophagitis: Secondary | ICD-10-CM | POA: Diagnosis not present

## 2017-12-28 DIAGNOSIS — D509 Iron deficiency anemia, unspecified: Secondary | ICD-10-CM | POA: Diagnosis not present

## 2017-12-28 DIAGNOSIS — E785 Hyperlipidemia, unspecified: Secondary | ICD-10-CM | POA: Diagnosis not present

## 2017-12-28 DIAGNOSIS — E119 Type 2 diabetes mellitus without complications: Secondary | ICD-10-CM | POA: Diagnosis not present

## 2017-12-28 DIAGNOSIS — F17210 Nicotine dependence, cigarettes, uncomplicated: Secondary | ICD-10-CM

## 2017-12-28 DIAGNOSIS — Z79899 Other long term (current) drug therapy: Secondary | ICD-10-CM

## 2017-12-28 DIAGNOSIS — Z9989 Dependence on other enabling machines and devices: Secondary | ICD-10-CM

## 2017-12-28 NOTE — Assessment & Plan Note (Signed)
1.  Normocytic anemia: - Recent admission in February 2019 with 2 units of blood transfusion.  Patient has been on oral iron supplements for a long time.  Currently taking 1 tablet 3 times a day.  Stool was tested negative for occult blood on 08/25/2017.  Reportedly had a colonoscopy 3 years ago which was normal.  This was done at an outside facility.  Iron panel and ferritin during last admission showed iron deficiency anemia.  Patient received parenteral iron once in the hospital.  Vitamin U72 and folic acid were borderline.  Patient taking vitamin B12 1 tablet once a month. - I have recommended parenteral iron, patient received 2 Feraheme infusions on 11/21/2017 and 11/28/2017.  She felt much better after the first infusion with improvement in energy levels.  Husband also reports that her energy levels have improved.  She did not have much improvement in energy after second infusion.  Even though her ferritin has improved to 181, I have recommended one more infusion of Feraheme to improve her fatigue.  We will see her back in 3 months for follow-up with repeat labs.  We have told her to cut back on iron to 1 tablet daily as it is causing dyspepsia.  She will continue B12 tablets daily as her B12 improved to normal levels.

## 2017-12-28 NOTE — Progress Notes (Signed)
[address] [place], [place] [address]   CLINIC:  Medical Oncology/Hematology  PCP:  Dettinger, Fransisca Kaufmann, MD [place] [phone]   REASON FOR VISIT:  Follow-up for iron deficiency anemia.  CURRENT THERAPY: Intermittent Feraheme infusions.    INTERVAL HISTORY:  [name] 67 y.o. female returns for follow-up of anemia.  She received 2 infusions of Feraheme on 11/21/2017 and 11/28/2017.  She felt great after the first infusion, and not so great after second one.  She is able to move better after iron infusions.  She is taking iron tablets 3 times a day.  She reports having some stomach upset with iron tablets.  She is also taking B12 tablet daily.  Denies any bleeding per rectum or melena.  Denies any recent hospitalizations or blood transfusions.    REVIEW OF SYSTEMS:  Review of Systems  Constitutional: Positive for chills and fatigue.  HENT:   Positive for trouble swallowing.   Gastrointestinal: Positive for nausea. Negative for constipation.  Skin: Positive for itching and rash.  All other systems reviewed and are negative.    PAST MEDICAL/SURGICAL HISTORY:  Past Medical History:  Diagnosis Date  . Allergy   . Anemia   . Anxiety   . Arthritis   . Asthma   . Blood transfusion without reported diagnosis   . Cataract   . CHF (congestive heart failure) ([place])   . Chronic kidney disease   . Clotting disorder ([place])   . COPD (chronic obstructive pulmonary disease) ([place])   . Depression   . Diabetes mellitus without complication ([place])   . Emphysema of lung ([place])   . GERD (gastroesophageal reflux disease)   . Hyperlipidemia   . Hypertension   . Neuromuscular disorder ([place])   . Osteoporosis   . Oxygen deficiency   . Seizures ([place])   . Skin cancer    2 areas removed more than 10 years ago  . Sleep apnea 1985   wears cpap nightly  . Thyroid disease    Past Surgical History:  Procedure Laterality Date  .  APPENDECTOMY    . EYE SURGERY    . FRACTURE SURGERY    . heart stents    . JOINT REPLACEMENT    . SPINAL CORD STIMULATOR IMPLANT  2015  . SPINE SURGERY       SOCIAL HISTORY:  Social History   Socioeconomic History  . Marital status: Married    Spouse name: Not on file  . Number of children: Not on file  . Years of education: Not on file  . Highest education level: Not on file  Occupational History  . Not on file  Social Needs  . Financial resource strain: Not on file  . Food insecurity:    Worry: Not on file    Inability: Not on file  . Transportation needs:    Medical: Not on file    Non-medical: Not on file  Tobacco Use  . Smoking status: Current Every Day Smoker    Packs/day: 0.75    Years: 43.00    Pack years: 32.25    Types: Cigarettes  . Smokeless tobacco: Never Used  Substance and Sexual Activity  . Alcohol use: No    Frequency: Never  . Drug use: No  . Sexual activity: Not on file  Lifestyle  . Physical activity:    Days per week: Not on file    Minutes per session: Not on file  .  Stress: Not on file  Relationships  . Social connections:    Talks on phone: Not on file    Gets together: Not on file    Attends religious service: Not on file    Active member of club or organization: Not on file    Attends meetings of clubs or organizations: Not on file    Relationship status: Not on file  . Intimate partner violence:    Fear of current or ex partner: Not on file    Emotionally abused: Not on file    Physically abused: Not on file    Forced sexual activity: Not on file  Other Topics Concern  . Not on file  Social History Narrative  . Not on file    FAMILY HISTORY:  Family History  Problem Relation Age of Onset  . Arthritis Mother   . Asthma Mother   . Cancer Mother        lung  . Depression Mother   . Diabetes Mother   . Arthritis Father   . Asthma Father   . Depression Father   . Asthma Sister   . Depression Sister   . Diabetes  Sister   . Arthritis Maternal Grandmother   . Asthma Maternal Grandmother   . Depression Maternal Grandmother   . Arthritis Maternal Grandfather   . Asthma Maternal Grandfather   . Arthritis Paternal Grandmother   . Asthma Paternal Grandmother   . Arthritis Paternal Grandfather   . Asthma Paternal Grandfather   . COPD Paternal Grandfather   . Diabetes Sister   . Mental illness Sister   . Anemia Sister   . Hypertension Son   . Post-traumatic stress disorder Son   . Allergies Daughter     CURRENT MEDICATIONS:  Outpatient Encounter Medications as of 12/28/2017  Medication Sig  . albuterol (PROVENTIL HFA) 108 (90 Base) MCG/ACT inhaler Inhale into the lungs.  Marland Kitchen alendronate (FOSAMAX) 70 MG tablet Take 1 tablet (70 mg total) by mouth every Monday. Take with a full glass of water on an empty stomach.  Marland Kitchen aspirin EC 81 MG tablet Take 81 mg by mouth daily.  Marland Kitchen atorvastatin (LIPITOR) 40 MG tablet Take 40 mg by mouth daily.  . cetirizine (ZYRTEC) 10 MG tablet Take 1 tablet (10 mg total) by mouth daily.  . Cholecalciferol (VITAMIN D-3) 5000 units TABS Take 5,000 Units by mouth daily.  . cyclobenzaprine (FLEXERIL) 10 MG tablet Take 10 mg by mouth 2 (two) times daily as needed for muscle spasms.  . diphenhydramine-acetaminophen (TYLENOL PM EXTRA STRENGTH) 25-500 MG TABS tablet Take 1 tablet by mouth at bedtime as needed (pain).   . DULoxetine (CYMBALTA) 30 MG capsule Take 30 mg by mouth daily.  Marland Kitchen EPINEPHrine 0.3 mg/0.3 mL IJ SOAJ injection Inject 0.3 mg into the muscle as needed.  . Ferrous Sulfate (IRON) 325 (65 Fe) MG TABS Take 1 tablet (325 mg total) by mouth daily. (Patient taking differently: Take 68 mg by mouth 2 (two) times daily. )  . fluticasone (FLONASE) 50 MCG/ACT nasal spray Place 2 sprays into both nostrils daily.  . fluticasone furoate-vilanterol (BREO ELLIPTA) 200-25 MCG/INH AEPB Inhale 1 puff into the lungs daily.  . Fluticasone-Salmeterol (ADVAIR) 250-50 MCG/DOSE AEPB Inhale 1 puff  into the lungs 2 (two) times daily.  Marland Kitchen gabapentin (NEURONTIN) 600 MG tablet Take 1 tablet (600 mg total) by mouth 4 (four) times daily.  Marland Kitchen ipratropium-albuterol (DUONEB) 0.5-2.5 (3) MG/3ML SOLN Take 3 mLs by nebulization every 6 (six)  hours as needed (shortness of breathe).   . levETIRAcetam (KEPPRA) 1000 MG tablet TAKE 1 TABLET BY MOUTH AT BEDTIME  . levothyroxine (SYNTHROID, LEVOTHROID) 50 MCG tablet Take 50 mcg by mouth daily before breakfast.  . Magnesium 400 MG CAPS Take 400 mg by mouth daily.  . metoprolol tartrate (LOPRESSOR) 25 MG tablet Take 25 mg by mouth 2 (two) times daily.  . Morphine-Naltrexone (EMBEDA) 30-1.2 MG CPCR Take by mouth.  . Multiple Vitamin (MULTIVITAMIN) tablet Take 1 tablet by mouth daily.  . naloxone (NARCAN) nasal spray 4 mg/0.1 mL Place into the nose.  . Omega-3 Fatty Acids (FISH OIL) 1000 MG CAPS Take 1,000 mg by mouth daily.  . [DISCONTINUED] levETIRAcetam (KEPPRA) 750 MG tablet Take 1 tablet (750 mg total) by mouth every morning.  . [DISCONTINUED] Morphine-Naltrexone 20-0.8 MG CPCR Take 1 capsule by mouth 2 (two) times daily.   No facility-administered encounter medications on file as of 12/28/2017.     ALLERGIES:  Allergies  Allergen Reactions  . Cephalexin Hives  . Ketorolac Tromethamine Hives  . Lac [place]ov[place] Nausea And Vomiting  . Imdur  [Isosorbide Dinitrate]   . Iodinated Diagnostic Agents   . Other Other (See Comments)    Alka seltzer causes blurred vision, fainting  . Sodium Bicarbonate-Citric Acid   . Milk-Related Compounds Nausea And Vomiting     PHYSICAL EXAM:  ECOG Performance status: 1  Vitals:   12/28/17 1404  BP: 138/62  Pulse: 71  Resp: 18  Temp: 98.8 F (37.1 C)  SpO2: 99%   There were no vitals filed for this visit.  Physical Exam   LABORATORY DATA:  I have reviewed the labs as listed.  CBC    Component Value Date/Time   WBC 11.2 (H) 12/26/2017 1530   RBC 4.13 12/26/2017 1530   HGB 11.2 (L) 12/26/2017 1530   HGB  9.7 (L) 10/24/2017 0851   HCT 37.3 12/26/2017 1530   HCT 33.1 (L) 10/24/2017 0851   PLT 282 12/26/2017 1530   PLT 342 10/24/2017 0851   MCV 90.3 12/26/2017 1530   MCV 79 10/24/2017 0851   MCH 27.1 12/26/2017 1530   MCHC 30.0 12/26/2017 1530   RDW 21.4 (H) 12/26/2017 1530   RDW 18.9 (H) 10/24/2017 0851   LYMPHSABS 1.9 12/26/2017 1530   LYMPHSABS 2.0 10/24/2017 0851   MONOABS 0.8 12/26/2017 1530   EOSABS 0.4 12/26/2017 1530   EOSABS 0.4 10/24/2017 0851   BASOSABS 0.0 12/26/2017 1530   BASOSABS 0.0 10/24/2017 0851   CMP Latest Ref Rng & Units 11/16/2017 10/31/2017 10/30/2017  Glucose 65 - 99 mg/dL 90 99 89  BUN 6 - 20 mg/dL 31(H) 9 14  Creatinine 0.44 - 1.00 mg/dL 0.91 0.55 0.54  Sodium 135 - 145 mmol/L 137 138 138  Potassium 3.5 - 5.1 mmol/L 4.0 4.7 5.0  Chloride 101 - 111 mmol/L 104 103 109  CO2 22 - 32 mmol/L 25 27 22   Calcium 8.9 - 10.3 mg/dL 9.0 9.5 8.5(L)  Total Protein 6.5 - 8.1 g/dL 6.9 - -  Total Bilirubin 0.3 - 1.2 mg/dL 0.5 - -  Alkaline Phos 38 - 126 U/L 94 - -  AST 15 - 41 U/L 20 - -  ALT 14 - 54 U/L 15 - -          ASSESSMENT & PLAN:   Iron deficiency anemia 1.  Normocytic anemia: - Recent admission in February 2019 with 2 units of blood transfusion.  Patient has been on oral  iron supplements for a long time.  Currently taking 1 tablet 3 times a day.  Stool was tested negative for occult blood on 08/25/2017.  Reportedly had a colonoscopy 3 years ago which was normal.  This was done at an outside facility.  Iron panel and ferritin during last admission showed iron deficiency anemia.  Patient received parenteral iron once in the hospital.  Vitamin F79 and folic acid were borderline.  Patient taking vitamin B12 1 tablet once a month. - I have recommended parenteral iron, patient received 2 Feraheme infusions on 11/21/2017 and 11/28/2017.  She felt much better after the first infusion with improvement in energy levels.  Husband also reports that her energy levels have  improved.  She did not have much improvement in energy after second infusion.  Even though her ferritin has improved to 181, I have recommended one more infusion of Feraheme to improve her fatigue.  We will see her back in 3 months for follow-up with repeat labs.  We have told her to cut back on iron to 1 tablet daily as it is causing dyspepsia.  She will continue B12 tablets daily as her B12 improved to normal levels.      Orders placed this encounter:  Orders Placed This Encounter  Procedures  . Comprehensive metabolic panel  . Iron and TIBC  . Ferritin  . CBC with Differential/Platelet      Derek Jack, MD [place] [phone]

## 2017-12-29 ENCOUNTER — Ambulatory Visit (HOSPITAL_COMMUNITY): Payer: Medicare PPO | Admitting: Hematology

## 2018-01-05 ENCOUNTER — Encounter (HOSPITAL_COMMUNITY): Payer: Self-pay

## 2018-01-05 ENCOUNTER — Inpatient Hospital Stay (HOSPITAL_COMMUNITY): Payer: Medicare PPO

## 2018-01-05 DIAGNOSIS — E119 Type 2 diabetes mellitus without complications: Secondary | ICD-10-CM | POA: Diagnosis not present

## 2018-01-05 DIAGNOSIS — I10 Essential (primary) hypertension: Secondary | ICD-10-CM | POA: Diagnosis not present

## 2018-01-05 DIAGNOSIS — R5383 Other fatigue: Secondary | ICD-10-CM | POA: Diagnosis not present

## 2018-01-05 DIAGNOSIS — J449 Chronic obstructive pulmonary disease, unspecified: Secondary | ICD-10-CM | POA: Diagnosis not present

## 2018-01-05 DIAGNOSIS — R110 Nausea: Secondary | ICD-10-CM | POA: Diagnosis not present

## 2018-01-05 DIAGNOSIS — E785 Hyperlipidemia, unspecified: Secondary | ICD-10-CM | POA: Diagnosis not present

## 2018-01-05 DIAGNOSIS — R6883 Chills (without fever): Secondary | ICD-10-CM | POA: Diagnosis not present

## 2018-01-05 DIAGNOSIS — K219 Gastro-esophageal reflux disease without esophagitis: Secondary | ICD-10-CM | POA: Diagnosis not present

## 2018-01-05 DIAGNOSIS — D509 Iron deficiency anemia, unspecified: Secondary | ICD-10-CM | POA: Diagnosis not present

## 2018-01-05 MED ORDER — SODIUM CHLORIDE 0.9 % IV SOLN
510.0000 mg | Freq: Once | INTRAVENOUS | Status: AC
  Administered 2018-01-05: 510 mg via INTRAVENOUS
  Filled 2018-01-05: qty 17

## 2018-01-05 MED ORDER — SODIUM CHLORIDE 0.9 % IV SOLN
INTRAVENOUS | Status: DC
  Administered 2018-01-05: 15:00:00 via INTRAVENOUS

## 2018-01-05 NOTE — Progress Notes (Signed)
Tasha Clarke tolerated Feraheme infusion well without complaints or incident. Peripheral IV site with positive blood return prior to and after infusion completed. VSS upon discharge. Pt discharged via wheelchair in satisfactory condition accompanied by her husband

## 2018-01-05 NOTE — Patient Instructions (Signed)
[facility] Discharge Instructions  Received Feraheme infusion today. Follow-up as scheduled. Call clinic for any questions or concerns   Thank you for choosing [facility] at [facility] to provide your oncology and hematology care.  To afford each patient quality time with our provider, please arrive at least 15 minutes before your scheduled appointment time.   If you have a lab appointment with the Cancer Center please come in thru the  Main Entrance and check in at the main information desk  You need to re-schedule your appointment should you arrive 10 or more minutes late.  We strive to give you quality time with our providers, and arriving late affects you and other patients whose appointments are after yours.  Also, if you no show three or more times for appointments you may be dismissed from the clinic at the providers discretion.     Again, thank you for choosing [facility].  Our hope is that these requests will decrease the amount of time that you wait before being seen by our physicians.       _____________________________________________________________  Should you have questions after your visit to [facility], please contact our office at [phone] between the hours of 8:30 a.m. and 4:30 p.m.  Voicemails left after 4:30 p.m. will not be returned until the following business day.  For prescription refill requests, have your pharmacy contact our office.       Resources For Cancer Patients and their Caregivers ? American Cancer Society: Can assist with transportation, wigs, general needs, runs Look Good Feel Better.        1-888-227-6333 ? Cancer Care: Provides financial assistance, online support groups, medication/co-pay assistance.  1-800-813-HOPE (4673) ? [facility] Assists [place] cancer patients and their families through emotional , educational and  financial support.  336-427-4357 ? [place] DSS Where to apply for food stamps, Medicaid and utility assistance. 336-342-1394 ? [facility]: Transportation to medical appointments. 336-347-2287 ? Social Security Administration: May apply for disability if have a Stage IV cancer. 336-342-7796 1-800-772-1213 ? [place] Aging, Disability and Transit Services: Assists with nutrition, care and transit needs. 336-349-2343  Cancer Center Support Programs:   > Cancer Support Group  2nd Tuesday of the month 1pm-2pm, Journey Room   > Creative Journey  3rd Tuesday of the month 1130am-1pm, Journey Room    

## 2018-01-17 ENCOUNTER — Other Ambulatory Visit: Payer: Self-pay

## 2018-01-17 ENCOUNTER — Inpatient Hospital Stay (HOSPITAL_COMMUNITY)
Admission: EM | Admit: 2018-01-17 | Discharge: 2018-01-24 | DRG: 100 | Disposition: A | Discharge status: Home Health | Payer: Medicare PPO | Attending: Family Medicine | Admitting: Family Medicine

## 2018-01-17 ENCOUNTER — Encounter (HOSPITAL_COMMUNITY): Payer: Self-pay | Admitting: Emergency Medicine

## 2018-01-17 ENCOUNTER — Emergency Department (HOSPITAL_COMMUNITY): Payer: Medicare PPO

## 2018-01-17 DIAGNOSIS — N189 Chronic kidney disease, unspecified: Secondary | ICD-10-CM | POA: Diagnosis present

## 2018-01-17 DIAGNOSIS — I509 Heart failure, unspecified: Secondary | ICD-10-CM | POA: Diagnosis present

## 2018-01-17 DIAGNOSIS — Z818 Family history of other mental and behavioral disorders: Secondary | ICD-10-CM

## 2018-01-17 DIAGNOSIS — Z978 Presence of other specified devices: Secondary | ICD-10-CM

## 2018-01-17 DIAGNOSIS — I130 Hypertensive heart and chronic kidney disease with heart failure and stage 1 through stage 4 chronic kidney disease, or unspecified chronic kidney disease: Secondary | ICD-10-CM | POA: Diagnosis present

## 2018-01-17 DIAGNOSIS — R1110 Vomiting, unspecified: Secondary | ICD-10-CM | POA: Diagnosis present

## 2018-01-17 DIAGNOSIS — E876 Hypokalemia: Secondary | ICD-10-CM | POA: Diagnosis present

## 2018-01-17 DIAGNOSIS — R569 Unspecified convulsions: Secondary | ICD-10-CM

## 2018-01-17 DIAGNOSIS — Z79899 Other long term (current) drug therapy: Secondary | ICD-10-CM

## 2018-01-17 DIAGNOSIS — F329 Major depressive disorder, single episode, unspecified: Secondary | ICD-10-CM | POA: Diagnosis present

## 2018-01-17 DIAGNOSIS — J439 Emphysema, unspecified: Secondary | ICD-10-CM | POA: Diagnosis not present

## 2018-01-17 DIAGNOSIS — R0689 Other abnormalities of breathing: Secondary | ICD-10-CM

## 2018-01-17 DIAGNOSIS — Z7983 Long term (current) use of bisphosphonates: Secondary | ICD-10-CM

## 2018-01-17 DIAGNOSIS — R112 Nausea with vomiting, unspecified: Secondary | ICD-10-CM | POA: Diagnosis not present

## 2018-01-17 DIAGNOSIS — I952 Hypotension due to drugs: Secondary | ICD-10-CM | POA: Diagnosis not present

## 2018-01-17 DIAGNOSIS — E785 Hyperlipidemia, unspecified: Secondary | ICD-10-CM | POA: Diagnosis present

## 2018-01-17 DIAGNOSIS — G894 Chronic pain syndrome: Secondary | ICD-10-CM | POA: Diagnosis present

## 2018-01-17 DIAGNOSIS — Z91018 Allergy to other foods: Secondary | ICD-10-CM

## 2018-01-17 DIAGNOSIS — J9811 Atelectasis: Secondary | ICD-10-CM | POA: Diagnosis not present

## 2018-01-17 DIAGNOSIS — Z91041 Radiographic dye allergy status: Secondary | ICD-10-CM

## 2018-01-17 DIAGNOSIS — M810 Age-related osteoporosis without current pathological fracture: Secondary | ICD-10-CM | POA: Diagnosis present

## 2018-01-17 DIAGNOSIS — J9601 Acute respiratory failure with hypoxia: Secondary | ICD-10-CM | POA: Diagnosis present

## 2018-01-17 DIAGNOSIS — R0989 Other specified symptoms and signs involving the circulatory and respiratory systems: Secondary | ICD-10-CM | POA: Diagnosis not present

## 2018-01-17 DIAGNOSIS — J449 Chronic obstructive pulmonary disease, unspecified: Secondary | ICD-10-CM | POA: Diagnosis present

## 2018-01-17 DIAGNOSIS — K219 Gastro-esophageal reflux disease without esophagitis: Secondary | ICD-10-CM | POA: Diagnosis present

## 2018-01-17 DIAGNOSIS — Z7982 Long term (current) use of aspirin: Secondary | ICD-10-CM

## 2018-01-17 DIAGNOSIS — R197 Diarrhea, unspecified: Secondary | ICD-10-CM

## 2018-01-17 DIAGNOSIS — F17210 Nicotine dependence, cigarettes, uncomplicated: Secondary | ICD-10-CM | POA: Diagnosis present

## 2018-01-17 DIAGNOSIS — G9340 Encephalopathy, unspecified: Secondary | ICD-10-CM | POA: Diagnosis not present

## 2018-01-17 DIAGNOSIS — I10 Essential (primary) hypertension: Secondary | ICD-10-CM | POA: Diagnosis not present

## 2018-01-17 DIAGNOSIS — Z955 Presence of coronary angioplasty implant and graft: Secondary | ICD-10-CM

## 2018-01-17 DIAGNOSIS — J9600 Acute respiratory failure, unspecified whether with hypoxia or hypercapnia: Secondary | ICD-10-CM

## 2018-01-17 DIAGNOSIS — R4182 Altered mental status, unspecified: Secondary | ICD-10-CM | POA: Diagnosis not present

## 2018-01-17 DIAGNOSIS — R1310 Dysphagia, unspecified: Secondary | ICD-10-CM | POA: Diagnosis not present

## 2018-01-17 DIAGNOSIS — Z8249 Family history of ischemic heart disease and other diseases of the circulatory system: Secondary | ICD-10-CM | POA: Diagnosis not present

## 2018-01-17 DIAGNOSIS — G40901 Epilepsy, unspecified, not intractable, with status epilepticus: Secondary | ICD-10-CM | POA: Diagnosis present

## 2018-01-17 DIAGNOSIS — Z825 Family history of asthma and other chronic lower respiratory diseases: Secondary | ICD-10-CM | POA: Diagnosis not present

## 2018-01-17 DIAGNOSIS — Z452 Encounter for adjustment and management of vascular access device: Secondary | ICD-10-CM | POA: Diagnosis not present

## 2018-01-17 DIAGNOSIS — Z85828 Personal history of other malignant neoplasm of skin: Secondary | ICD-10-CM

## 2018-01-17 DIAGNOSIS — Z4682 Encounter for fitting and adjustment of non-vascular catheter: Secondary | ICD-10-CM | POA: Diagnosis not present

## 2018-01-17 DIAGNOSIS — R063 Periodic breathing: Secondary | ICD-10-CM | POA: Diagnosis not present

## 2018-01-17 DIAGNOSIS — K529 Noninfective gastroenteritis and colitis, unspecified: Secondary | ICD-10-CM | POA: Diagnosis present

## 2018-01-17 DIAGNOSIS — Z888 Allergy status to other drugs, medicaments and biological substances status: Secondary | ICD-10-CM

## 2018-01-17 DIAGNOSIS — F419 Anxiety disorder, unspecified: Secondary | ICD-10-CM | POA: Diagnosis present

## 2018-01-17 DIAGNOSIS — J45909 Unspecified asthma, uncomplicated: Secondary | ICD-10-CM | POA: Diagnosis not present

## 2018-01-17 DIAGNOSIS — Z22322 Carrier or suspected carrier of Methicillin resistant Staphylococcus aureus: Secondary | ICD-10-CM

## 2018-01-17 DIAGNOSIS — Z9289 Personal history of other medical treatment: Secondary | ICD-10-CM

## 2018-01-17 DIAGNOSIS — E039 Hypothyroidism, unspecified: Secondary | ICD-10-CM

## 2018-01-17 DIAGNOSIS — Z79890 Hormone replacement therapy (postmenopausal): Secondary | ICD-10-CM

## 2018-01-17 DIAGNOSIS — G40501 Epileptic seizures related to external causes, not intractable, with status epilepticus: Secondary | ICD-10-CM | POA: Diagnosis not present

## 2018-01-17 LAB — GLUCOSE, CAPILLARY: GLUCOSE-CAPILLARY: 115 mg/dL — AB (ref 70–99)

## 2018-01-17 LAB — DIFFERENTIAL
BASOS ABS: 0 10*3/uL (ref 0.0–0.1)
BASOS PCT: 0 %
EOS ABS: 0.1 10*3/uL (ref 0.0–0.7)
Eosinophils Relative: 1 %
LYMPHS ABS: 0.9 10*3/uL (ref 0.7–4.0)
Lymphocytes Relative: 9 %
Monocytes Absolute: 1 10*3/uL (ref 0.1–1.0)
Monocytes Relative: 10 %
NEUTROS PCT: 80 %
Neutro Abs: 8 10*3/uL — ABNORMAL HIGH (ref 1.7–7.7)

## 2018-01-17 LAB — COMPREHENSIVE METABOLIC PANEL
ALBUMIN: 3.4 g/dL — AB (ref 3.5–5.0)
ALT: 15 U/L (ref 0–44)
AST: 23 U/L (ref 15–41)
Alkaline Phosphatase: 92 U/L (ref 38–126)
Anion gap: 13 (ref 5–15)
BUN: 10 mg/dL (ref 8–23)
CHLORIDE: 102 mmol/L (ref 98–111)
CO2: 23 mmol/L (ref 22–32)
Calcium: 9.1 mg/dL (ref 8.9–10.3)
Creatinine, Ser: 0.68 mg/dL (ref 0.44–1.00)
GFR calc Af Amer: >60 mL/min (ref >60)
Glucose, Bld: 164 mg/dL — ABNORMAL HIGH (ref 70–99)
POTASSIUM: 2.7 mmol/L — AB (ref 3.5–5.1)
Sodium: 138 mmol/L (ref 135–145)
Total Bilirubin: 0.6 mg/dL (ref 0.3–1.2)
Total Protein: 7 g/dL (ref 6.5–8.1)

## 2018-01-17 LAB — CBC
HEMATOCRIT: 43.2 % (ref 36.0–46.0)
Hemoglobin: 14.3 g/dL (ref 12.0–15.0)
MCH: 29.4 pg (ref 26.0–34.0)
MCHC: 33.1 g/dL (ref 30.0–36.0)
MCV: 88.9 fL (ref 78.0–100.0)
Platelets: 293 10*3/uL (ref 150–400)
RBC: 4.86 MIL/uL (ref 3.87–5.11)
RDW: 18.8 % — AB (ref 11.5–15.5)
WBC: 10.2 10*3/uL (ref 4.0–10.5)

## 2018-01-17 LAB — URINALYSIS, ROUTINE W REFLEX MICROSCOPIC
BILIRUBIN URINE: NEGATIVE
Glucose, UA: NEGATIVE mg/dL
Hgb urine dipstick: NEGATIVE
KETONES UR: NEGATIVE mg/dL
LEUKOCYTES UA: NEGATIVE
Nitrite: NEGATIVE
PROTEIN: 30 mg/dL — AB
SPECIFIC GRAVITY, URINE: 1.014 (ref 1.005–1.030)
pH: 6 (ref 5.0–8.0)

## 2018-01-17 LAB — LIPASE, BLOOD: Lipase: 27 U/L (ref 11–51)

## 2018-01-17 LAB — MRSA PCR SCREENING: MRSA BY PCR: NEGATIVE

## 2018-01-17 MED ORDER — POTASSIUM CHLORIDE 10 MEQ/100ML IV SOLN
10.0000 meq | Freq: Once | INTRAVENOUS | Status: AC
  Administered 2018-01-17: 10 meq via INTRAVENOUS
  Filled 2018-01-17: qty 100

## 2018-01-17 MED ORDER — ONDANSETRON HCL 4 MG/2ML IJ SOLN
INTRAMUSCULAR | Status: AC
  Filled 2018-01-17: qty 2

## 2018-01-17 MED ORDER — ACETAMINOPHEN 650 MG RE SUPP
650.0000 mg | Freq: Four times a day (QID) | RECTAL | Status: DC | PRN
  Administered 2018-01-17: 650 mg via RECTAL
  Filled 2018-01-17: qty 1

## 2018-01-17 MED ORDER — HYDRALAZINE HCL 20 MG/ML IJ SOLN
INTRAMUSCULAR | Status: AC
  Filled 2018-01-17: qty 1

## 2018-01-17 MED ORDER — SODIUM CHLORIDE 0.9 % IV BOLUS
1000.0000 mL | Freq: Once | INTRAVENOUS | Status: AC
  Administered 2018-01-17: 1000 mL via INTRAVENOUS

## 2018-01-17 MED ORDER — METOCLOPRAMIDE HCL 5 MG/ML IJ SOLN
10.0000 mg | Freq: Once | INTRAMUSCULAR | Status: AC
  Administered 2018-01-17: 10 mg via INTRAVENOUS
  Filled 2018-01-17: qty 2

## 2018-01-17 MED ORDER — HYDRALAZINE HCL 20 MG/ML IJ SOLN
10.0000 mg | Freq: Once | INTRAMUSCULAR | Status: AC
  Administered 2018-01-17: 10 mg via INTRAVENOUS
  Filled 2018-01-17: qty 1

## 2018-01-17 MED ORDER — LORAZEPAM 2 MG/ML IJ SOLN
2.0000 mg | INTRAMUSCULAR | Status: DC | PRN
  Administered 2018-01-18 – 2018-01-19 (×6): 2 mg via INTRAVENOUS
  Filled 2018-01-17 (×5): qty 1

## 2018-01-17 MED ORDER — FAMOTIDINE IN NACL 20-0.9 MG/50ML-% IV SOLN
20.0000 mg | Freq: Two times a day (BID) | INTRAVENOUS | Status: DC
  Administered 2018-01-18 – 2018-01-19 (×4): 20 mg via INTRAVENOUS
  Filled 2018-01-17 (×4): qty 50

## 2018-01-17 MED ORDER — IPRATROPIUM-ALBUTEROL 0.5-2.5 (3) MG/3ML IN SOLN
3.0000 mL | Freq: Four times a day (QID) | RESPIRATORY_TRACT | Status: DC
  Administered 2018-01-17: 3 mL via RESPIRATORY_TRACT
  Filled 2018-01-17: qty 3

## 2018-01-17 MED ORDER — POTASSIUM CHLORIDE IN NACL 40-0.9 MEQ/L-% IV SOLN
INTRAVENOUS | Status: DC
  Administered 2018-01-18 – 2018-01-19 (×5): 125 mL/h via INTRAVENOUS
  Administered 2018-01-20: 10 mL/h via INTRAVENOUS
  Filled 2018-01-17 (×2): qty 1000

## 2018-01-17 MED ORDER — IPRATROPIUM-ALBUTEROL 0.5-2.5 (3) MG/3ML IN SOLN
3.0000 mL | Freq: Three times a day (TID) | RESPIRATORY_TRACT | Status: DC
  Administered 2018-01-18 – 2018-01-19 (×3): 3 mL via RESPIRATORY_TRACT
  Filled 2018-01-17 (×4): qty 3

## 2018-01-17 MED ORDER — FAMOTIDINE IN NACL 20-0.9 MG/50ML-% IV SOLN
20.0000 mg | Freq: Once | INTRAVENOUS | Status: AC
  Administered 2018-01-17: 20 mg via INTRAVENOUS
  Filled 2018-01-17: qty 50

## 2018-01-17 MED ORDER — LOPERAMIDE HCL 2 MG PO CAPS
ORAL_CAPSULE | ORAL | Status: AC
  Filled 2018-01-17: qty 2

## 2018-01-17 MED ORDER — HYDRALAZINE HCL 20 MG/ML IJ SOLN: 10.0000 mg | INTRAMUSCULAR | Status: DC | PRN

## 2018-01-17 MED ORDER — ENOXAPARIN SODIUM 40 MG/0.4ML ~~LOC~~ SOLN
40.0000 mg | SUBCUTANEOUS | Status: DC
  Administered 2018-01-18 – 2018-01-23 (×6): 40 mg via SUBCUTANEOUS
  Filled 2018-01-17 (×6): qty 0.4

## 2018-01-17 MED ORDER — ONDANSETRON HCL 4 MG/2ML IJ SOLN
4.0000 mg | Freq: Once | INTRAMUSCULAR | Status: AC | PRN
  Administered 2018-01-17: 4 mg via INTRAVENOUS

## 2018-01-17 MED ORDER — ACETAMINOPHEN 325 MG PO TABS
650.0000 mg | ORAL_TABLET | Freq: Four times a day (QID) | ORAL | Status: DC | PRN
  Administered 2018-01-20 (×2): 650 mg via ORAL
  Filled 2018-01-17 (×2): qty 2

## 2018-01-17 MED ORDER — IPRATROPIUM-ALBUTEROL 0.5-2.5 (3) MG/3ML IN SOLN
3.0000 mL | Freq: Four times a day (QID) | RESPIRATORY_TRACT | Status: DC | PRN
  Administered 2018-01-18 – 2018-01-19 (×2): 3 mL via RESPIRATORY_TRACT
  Filled 2018-01-17: qty 3

## 2018-01-17 MED ORDER — ONDANSETRON HCL 4 MG/2ML IJ SOLN
4.0000 mg | Freq: Four times a day (QID) | INTRAMUSCULAR | Status: DC | PRN
  Administered 2018-01-18 – 2018-01-19 (×2): 4 mg via INTRAVENOUS
  Filled 2018-01-17 (×2): qty 2

## 2018-01-17 MED ORDER — LEVETIRACETAM IN NACL 1000 MG/100ML IV SOLN
1000.0000 mg | Freq: Once | INTRAVENOUS | Status: AC
  Administered 2018-01-17: 1000 mg via INTRAVENOUS
  Filled 2018-01-17: qty 100

## 2018-01-17 MED ORDER — LOPERAMIDE HCL 2 MG PO CAPS
4.0000 mg | ORAL_CAPSULE | Freq: Once | ORAL | Status: AC
  Administered 2018-01-17: 4 mg via ORAL

## 2018-01-17 MED ORDER — ONDANSETRON HCL 4 MG PO TABS: 4.0000 mg | ORAL_TABLET | Freq: Four times a day (QID) | ORAL | Status: DC | PRN

## 2018-01-17 MED ORDER — LEVETIRACETAM IN NACL 1000 MG/100ML IV SOLN
1000.0000 mg | Freq: Two times a day (BID) | INTRAVENOUS | Status: DC
  Administered 2018-01-18: 1000 mg via INTRAVENOUS
  Filled 2018-01-17 (×2): qty 100

## 2018-01-17 MED ORDER — LORAZEPAM 2 MG/ML IJ SOLN
INTRAMUSCULAR | Status: AC
  Administered 2018-01-17: 2 mg
  Filled 2018-01-17: qty 1

## 2018-01-17 MED ORDER — FLUTICASONE FUROATE-VILANTEROL 200-25 MCG/INH IN AEPB
1.0000 | INHALATION_SPRAY | Freq: Every day | RESPIRATORY_TRACT | Status: DC
  Filled 2018-01-17 (×2): qty 28

## 2018-01-17 MED ORDER — LORAZEPAM 2 MG/ML IJ SOLN
2.0000 mg | Freq: Once | INTRAMUSCULAR | Status: AC
  Administered 2018-01-17: 2 mg via INTRAVENOUS

## 2018-01-17 NOTE — ED Triage Notes (Signed)
Patient complaining of vomiting and diarrhea and generalized body aches since yesterday.

## 2018-01-17 NOTE — Progress Notes (Signed)
Rapid Response Event Note Called by 300 with rapid response. Arrived on floor with about 6-300 nurses, Dr. Roderic Palau, and Nurse Supervisor. Told that patient had seizures and had just come up from ER. Reminded to get blood sugar  Which was 115. Vital was being taken by 300 nurses when Dr. Roderic Palau made the decision was made to bring to ICU.  Overview:  All vitals were taken and labs that had been drawn in ED was being reviewed. Dr. Roderic Palau had never seen patient in ER and did not know he was being assigned to this patient. Patient having intermitten seizure like activity. Complaining of nausea with small amount of vomiting.     Initial Focused Assessment: Focused assessment was completed by Dr. Roderic Palau and was able to make all orders.   Interventions: Transferred to ICU room 7  Plan of Care (if not transferred):  Event Summary: Rapid response called 1610.   at  [facility] Dr. Roderic Palau at patients side.     at  14  Transferred to ICU.         Debarah Crape

## 2018-01-17 NOTE — Progress Notes (Addendum)
Pt not tolerating IV flush to the point of almost taking out IV in wrist. Pt is a difficult stick. Keppra and IV fluids placed on hold until new access obtained. Dr. Roderic Palau notified.  Contacted vascular wellness for PICC placement

## 2018-01-17 NOTE — Progress Notes (Signed)
Called to room by nurse tech. States patient was talking to her and then became unresponsive. Patient breathing and had pulse when nurse entered room but was unresponsive and staring off to the side. Rapid response called at 1610. Vitals obtained. Dr. Roderic Palau notified and was on unit. Patient noted to have seizure-like activity. Orders received for Ativan 2 mg IV x 1 now. Given as ordered. CBG checked - 115. MD present. Husband at bedside stated "She hasn't taken her medications for the past 2 days." history of seizures per ED nurse report. Stated patient received Keppra IV x 1 dose in the ED. Husband reports "she was doing this downstairs too". Patient noted to have second episode of seizure-like activity after Ativan given. MD at bedside. Orders received to transfer patient to stepdown unit. ICU nurse and nursing supervisor present for rapid response, assisted with transfer of patient once she was more alert and stable for transport. Pt alert on arrival to stepdown unit. Report given to Benjamine Mola, RN receiving nurse in stepdown. Donavan Foil, RN

## 2018-01-17 NOTE — Progress Notes (Signed)
Pt spouse at nurses station, requesting to see if POA is listed in chart. It was not. Copies made and put into chart, and contact information updated in chart as well. Will notify RN taking care of the patient.

## 2018-01-17 NOTE — H&P (Signed)
History and Physical    Tasha Clarke HMC:[id number] DOB: [birth year] DOA: 01/17/2018  PCP: Dettinger, Fransisca Kaufmann, MD  Patient coming from: Home  I have personally briefly reviewed patient's old medical records in [place]  Chief Complaint: Nausea vomiting  HPI: Tasha Clarke is a 67 y.o. female with medical history significant of COPD, seizure disorder, chronic pain syndrome, presents to the hospital with nausea, vomiting, diarrhea for the past 1 to 2 days.  Her p.o. intake has been poor.  She is unable to take any of her medications.  She has not had any sick contacts.  History is somewhat limited since patient cannot participate due to her mental status.  Details are obtained from her husband.  She has not had any melena or hematochezia.  She was evaluated in the emergency room where she was noted to be hypokalemic.  Remainder of lab work was relatively unrevealing.  Since she has not had her seizure medication several days, she did receive a dose of intravenous Keppra.  She was brought to the medical floor for further evaluation.  Upon arrival to the medical floor, the patient had a seizure.  Rapid response was called and she was given Ativan.  After a second seizure, decision was made to transfer the patient in stepdown unit for closer monitoring.  Husband reports that she has not had a seizure in over a year  Review of Systems: As per HPI otherwise 10 point review of systems negative.    Past Medical History:  Diagnosis Date  . Allergy   . Anemia   . Anxiety   . Arthritis   . Asthma   . Blood transfusion without reported diagnosis   . Cataract   . CHF (congestive heart failure) ([place])   . Chronic kidney disease   . Clotting disorder ([place])   . COPD (chronic obstructive pulmonary disease) ([place])   . Depression   . Diabetes mellitus without complication ([place])   . Emphysema of lung ([place])   . GERD (gastroesophageal reflux disease)   . Hyperlipidemia   . Hypertension   .  Neuromuscular disorder ([place])   . Osteoporosis   . Oxygen deficiency   . Seizures ([place])   . Skin cancer    2 areas removed more than 10 years ago  . Sleep apnea 1985   wears cpap nightly  . Thyroid disease     Past Surgical History:  Procedure Laterality Date  . APPENDECTOMY    . EYE SURGERY    . FRACTURE SURGERY    . heart stents    . JOINT REPLACEMENT    . SPINAL CORD STIMULATOR IMPLANT  2015  . SPINE SURGERY       reports that she has been smoking cigarettes.  She has a 32.25 pack-year smoking history. She has never used smokeless tobacco. She reports that she does not drink alcohol or use drugs.  Allergies  Allergen Reactions  . Cephalexin Hives  . Ketorolac Tromethamine Hives  . Lac Bovis Nausea And Vomiting  . Imdur  [Isosorbide Dinitrate]   . Iodinated Diagnostic Agents   . Other Other (See Comments)    Alka seltzer causes blurred vision, fainting  . Sodium Bicarbonate-Citric Acid   . Milk-Related Compounds Nausea And Vomiting    Family History  Problem Relation Age of Onset  . Arthritis Mother   . Asthma Mother   . Cancer Mother        lung  . Depression Mother   .  Diabetes Mother   . Arthritis Father   . Asthma Father   . Depression Father   . Asthma Sister   . Depression Sister   . Diabetes Sister   . Arthritis Maternal Grandmother   . Asthma Maternal Grandmother   . Depression Maternal Grandmother   . Arthritis Maternal Grandfather   . Asthma Maternal Grandfather   . Arthritis Paternal Grandmother   . Asthma Paternal Grandmother   . Arthritis Paternal Grandfather   . Asthma Paternal Grandfather   . COPD Paternal Grandfather   . Diabetes Sister   . Mental illness Sister   . Anemia Sister   . Hypertension Son   . Post-traumatic stress disorder Son   . Allergies Daughter      Prior to Admission medications   Medication Sig Start Date End Date Taking? Authorizing Provider  albuterol (PROVENTIL HFA) 108 (90 Base) MCG/ACT inhaler Inhale into  the lungs.   Yes [provider]  diphenhydramine-acetaminophen (TYLENOL PM EXTRA STRENGTH) 25-500 MG TABS tablet Take 1 tablet by mouth at bedtime as needed (pain).    Yes [provider]  EPINEPHrine 0.3 mg/0.3 mL IJ SOAJ injection Inject 0.3 mg into the muscle as needed.   Yes [provider]  ipratropium-albuterol (DUONEB) 0.5-2.5 (3) MG/3ML SOLN Take 3 mLs by nebulization every 6 (six) hours as needed (shortness of breathe).    Yes [provider]  Morphine-Naltrexone (EMBEDA) 30-1.2 MG CPCR Take 1 capsule by mouth 2 (two) times daily.   Yes [provider]  naloxone (NARCAN) nasal spray 4 mg/0.1 mL Place into the nose. 08/18/17  Yes [provider]  alendronate (FOSAMAX) 70 MG tablet Take 1 tablet (70 mg total) by mouth every Monday. Take with a full glass of water on an empty stomach. 12/05/17   Dettinger, Fransisca Kaufmann, MD  aspirin EC 81 MG tablet Take 81 mg by mouth daily.    [provider]  atorvastatin (LIPITOR) 40 MG tablet Take 40 mg by mouth daily.    [provider]  cetirizine (ZYRTEC) 10 MG tablet Take 1 tablet (10 mg total) by mouth daily. 08/31/17   Dettinger, Fransisca Kaufmann, MD  Cholecalciferol (VITAMIN D-3) 5000 units TABS Take 5,000 Units by mouth daily.    [provider]  cyclobenzaprine (FLEXERIL) 10 MG tablet Take 10 mg by mouth 2 (two) times daily as needed for muscle spasms.    [provider]  DULoxetine (CYMBALTA) 30 MG capsule Take 30 mg by mouth daily.    [provider]  Ferrous Sulfate (IRON) 325 (65 Fe) MG TABS Take 1 tablet (325 mg total) by mouth daily. 08/27/17   Velvet Bathe, MD  fluticasone (FLONASE) 50 MCG/ACT nasal spray Place 2 sprays into both nostrils daily. 08/31/17   Dettinger, Fransisca Kaufmann, MD  fluticasone furoate-vilanterol (BREO ELLIPTA) 200-25 MCG/INH AEPB Inhale 1 puff into the lungs daily. 08/31/17   Dettinger, Fransisca Kaufmann, MD  Fluticasone-Salmeterol (ADVAIR) 250-50  MCG/DOSE AEPB Inhale 1 puff into the lungs 2 (two) times daily.    [provider]  gabapentin (NEURONTIN) 600 MG tablet Take 1 tablet (600 mg total) by mouth 4 (four) times daily. 10/26/17   Dettinger, Fransisca Kaufmann, MD  levETIRAcetam (KEPPRA) 1000 MG tablet TAKE 1 TABLET BY MOUTH AT BEDTIME 12/28/17   Dettinger, Fransisca Kaufmann, MD  levothyroxine (SYNTHROID, LEVOTHROID) 50 MCG tablet Take 50 mcg by mouth daily before breakfast.    [provider]  losartan (COZAAR) 100 MG tablet TAKE 1  2 (ONE HALF) TABLET BY MOUTH ONCE DAILY 01/02/18   [provider]  Magnesium 400 MG CAPS Take 400 mg by mouth daily.    [provider]  metoprolol tartrate (LOPRESSOR) 25 MG tablet Take 25 mg by mouth 2 (two) times daily.    [provider]  Multiple Vitamin (MULTIVITAMIN) tablet Take 1 tablet by mouth daily.    [provider]  Omega-3 Fatty Acids (FISH OIL) 1000 MG CAPS Take 1,000 mg by mouth daily.    [provider]    Physical Exam: Vitals:   01/17/18 1530 01/17/18 1550 01/17/18 1613 01/17/18 1709  BP: (!) 146/74 130/70 (!) 186/88   Pulse: 92 88 94   Resp: 16 14 12    Temp:   99.2 F (37.3 C) (!) 100.4 F (38 C)  TempSrc:   Axillary Rectal  SpO2: 98% 98% 100%   Weight:    87.2 kg (192 lb 3.9 oz)  Height:    5\' 5"  (1.651 m)    Constitutional: NAD, calm, comfortable Vitals:   01/17/18 1530 01/17/18 1550 01/17/18 1613 01/17/18 1709  BP: (!) 146/74 130/70 (!) 186/88   Pulse: 92 88 94   Resp: 16 14 12    Temp:   99.2 F (37.3 C) (!) 100.4 F (38 C)  TempSrc:   Axillary Rectal  SpO2: 98% 98% 100%   Weight:    87.2 kg (192 lb 3.9 oz)  Height:    5\' 5"  (1.651 m)   Eyes: PERRL, lids and conjunctivae normal ENMT: Mucous membranes are moist. Posterior pharynx clear of any exudate or lesions.Normal dentition.  Neck: normal, supple, no masses, no thyromegaly Respiratory: Bilateral wheezes. Normal respiratory effort. No accessory muscle use.    Cardiovascular: Tachycardic, no murmurs / rubs / gallops. No extremity edema. 2+ pedal pulses. No carotid bruits.  Abdomen: Diffusely tender, soft, no masses palpated. No hepatosplenomegaly. Bowel sounds positive.  Musculoskeletal: no clubbing / cyanosis. No joint deformity upper and lower extremities. Good ROM, no contractures. Normal muscle tone.  Skin: no rashes, lesions, ulcers. No induration Neurologic: Moving all extremities spontaneously, lethargic so does not participate in exam Psychiatric: Lethargic, does not answer questions   Labs on Admission: I have personally reviewed following labs and imaging studies  CBC: Recent Labs  Lab 01/17/18 0736  WBC 10.2  NEUTROABS 8.0*  HGB 14.3  HCT 43.2  MCV 88.9  PLT 585   Basic Metabolic Panel: Recent Labs  Lab 01/17/18 0736  NA 138  K 2.7*  CL 102  CO2 23  GLUCOSE 164*  BUN 10  CREATININE 0.68  CALCIUM 9.1   GFR: Estimated Creatinine Clearance: 75.5 mL/min (by C-G formula based on SCr of 0.68 mg/dL). Liver Function Tests: Recent Labs  Lab 01/17/18 0736  AST 23  ALT 15  ALKPHOS 92  BILITOT 0.6  PROT 7.0  ALBUMIN 3.4*   Recent Labs  Lab 01/17/18 0736  LIPASE 27   No results for input(s): AMMONIA in the last 168 hours. Coagulation Profile: No results for input(s): INR, PROTIME in the last 168 hours. Cardiac Enzymes: No results for input(s): CKTOTAL, CKMB, CKMBINDEX, TROPONINI in the last 168 hours. BNP (last 3 results) No results for input(s): PROBNP in the last 8760 hours. HbA1C: No results for input(s): HGBA1C in the last 72 hours. CBG: Recent Labs  Lab 01/17/18 1617  GLUCAP 115*   Lipid Profile: No results for input(s): CHOL, HDL, LDLCALC, TRIG, CHOLHDL, LDLDIRECT in the last 72 hours. Thyroid Function  Tests: No results for input(s): TSH, T4TOTAL, FREET4, T3FREE, THYROIDAB in the last 72 hours. Anemia Panel: No results for input(s): VITAMINB12, FOLATE, FERRITIN, TIBC, IRON, RETICCTPCT in the  last 72 hours. Urine analysis:    Component Value Date/Time   COLORURINE YELLOW 01/17/2018 [place] 01/17/2018 1144   LABSPEC 1.014 01/17/2018 1144   PHURINE 6.0 01/17/2018 1144   GLUCOSEU NEGATIVE 01/17/2018 1144   HGBUR NEGATIVE 01/17/2018 [place] 01/17/2018 [place] 01/17/2018 1144   PROTEINUR 30 (A) 01/17/2018 1144   NITRITE NEGATIVE 01/17/2018 1144   LEUKOCYTESUR NEGATIVE 01/17/2018 1144    Radiological Exams on Admission: Ct Abdomen Pelvis Wo Contrast  Result Date: 01/17/2018 CLINICAL DATA:  Vomiting, diarrhea and body aches. EXAM: CT ABDOMEN AND PELVIS WITHOUT CONTRAST TECHNIQUE: Multidetector CT imaging of the abdomen and pelvis was performed following the standard protocol without IV contrast. COMPARISON:  None. FINDINGS: Lower chest: No acute abnormality. Hepatobiliary: No focal liver abnormality is seen. Status post cholecystectomy. No biliary dilatation. Pancreas: The pancreas appears atrophic. No obvious mass or inflammation by unenhanced CT. Spleen: Normal in size without focal abnormality. Adrenals/Urinary Tract: Nodularity along the superior aspect of the right adrenal gland measures approximately 13 mm and demonstrates low internal density of 4 Hounsfield units. This most likely corresponds to a benign adenoma. The left adrenal gland appears unremarkable. The kidneys show no evidence of hydronephrosis. There is a tiny nonobstructing calculus within the medial aspect of the mid right kidney measuring approximately 2 mm. The bladder is unremarkable. Stomach/Bowel: The stomach is partially distended with fluid. Along the posterior wall of the fundus, there is some potential nodular thickening with polypoid extension into the lumen. A mass cannot be excluded. This region of thickening measures roughly 3 cm in estimated diameter. Posterior aspect of the gastric fundus and cardia also demonstrates an area of focal outpouching  containing a gas bubble which could represent a small diverticulum versus area of ulceration. No evidence to suggest gastric perforation or obstruction. There is some fluid scattered throughout the small bowel which is nonspecific but may be consistent with enteritis. No evidence of small bowel obstruction. The colon is decompressed. No free air or focal abscess identified. Vascular/Lymphatic: No significant vascular findings are present. No enlarged abdominal or pelvic lymph nodes. Reproductive: Status post hysterectomy. No adnexal masses. Other: Spinal stimulator generator is positioned within the subcutaneous tissues of the left trans lumbar region with electrodes extending into the lower thoracic canal. Musculoskeletal: No acute or significant osseous findings. IMPRESSION: 1. Gastric wall thickening along the posterior aspect of the fundus and cardia with potential polypoid extension into the gastric lumen. Underlying gastric mass cannot be excluded. There also may be component of a small diverticulum versus area of ulceration along the posterior wall of the proximal stomach. Correlation with endoscopy may be helpful if there are any symptoms suggestive of gastric inflammation or pathology. 2. Possible enteritis involving the small bowel with scattered fluid throughout the small bowel. 3. 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 Electronically Signed   By: Aletta Edouard M.D.   On: 01/17/2018 10:31    EKG: Independently reviewed. Sinus rhythm without any ST-T changes  Assessment/Plan Active Problems:   COPD (chronic obstructive pulmonary disease) (HCC)   Chronic pain syndrome   Hypothyroid   Hypertension   Hypokalemia   Seizure ([facility])   Gastroenteritis     1. Acute gastroenteritis.  Will check GI pathogen panel.  Suspect this is viral.  Continue supportive treatment.  Abnormality noted on GI and stomach will need to be further evaluated with endoscopy.  Will  consult gastroenterology. 2. Seizure disorder.  Patient has had 2 breakthrough seizures since admission.  She will be continued on intravenous Keppra.  Ativan as needed for breakthrough seizures.  Continue to monitor closely in stepdown.  This is likely related to inability for her to tolerate p.o. medications with her acute GI illness. 3. Hypokalemia.  Likely related to GI losses.  Replace.  Check magnesium. 4. COPD.  She does have bilateral wheezes.  Continue bronchodilators for now. 5. Chronic pain syndrome.  Patient is still somewhat somnolent.  Will hold off on any further pain medications. 6. Hypothyroidism.  Resume oral levothyroxine once mental status has improved.  DVT prophylaxis: lovenox  Code Status:  Full code  Family Communication: Discussed with husband at the bedside Disposition Plan: Discharge home once improved Consults called:   Admission status: Observation, stepdown  Kathie Dike MD [facility] Pager [phone]  If 7PM-7AM, please contact night-coverage [email or web address] Password [facility]  01/17/2018, 7:31 PM

## 2018-01-18 ENCOUNTER — Inpatient Hospital Stay (HOSPITAL_COMMUNITY)
Admit: 2018-01-18 | Discharge: 2018-01-18 | Disposition: A | Discharge status: Home / Self Care | Payer: Medicare PPO | Attending: Internal Medicine | Admitting: Internal Medicine

## 2018-01-18 ENCOUNTER — Observation Stay (HOSPITAL_COMMUNITY): Payer: Medicare PPO

## 2018-01-18 DIAGNOSIS — J449 Chronic obstructive pulmonary disease, unspecified: Secondary | ICD-10-CM | POA: Diagnosis present

## 2018-01-18 DIAGNOSIS — R569 Unspecified convulsions: Secondary | ICD-10-CM | POA: Diagnosis not present

## 2018-01-18 DIAGNOSIS — I10 Essential (primary) hypertension: Secondary | ICD-10-CM | POA: Diagnosis not present

## 2018-01-18 DIAGNOSIS — R1110 Vomiting, unspecified: Secondary | ICD-10-CM | POA: Diagnosis present

## 2018-01-18 DIAGNOSIS — Z8249 Family history of ischemic heart disease and other diseases of the circulatory system: Secondary | ICD-10-CM | POA: Diagnosis not present

## 2018-01-18 DIAGNOSIS — Z79899 Other long term (current) drug therapy: Secondary | ICD-10-CM | POA: Diagnosis not present

## 2018-01-18 DIAGNOSIS — G9340 Encephalopathy, unspecified: Secondary | ICD-10-CM | POA: Diagnosis not present

## 2018-01-18 DIAGNOSIS — J9601 Acute respiratory failure with hypoxia: Secondary | ICD-10-CM | POA: Diagnosis present

## 2018-01-18 DIAGNOSIS — I509 Heart failure, unspecified: Secondary | ICD-10-CM | POA: Diagnosis present

## 2018-01-18 DIAGNOSIS — Z7983 Long term (current) use of bisphosphonates: Secondary | ICD-10-CM | POA: Diagnosis not present

## 2018-01-18 DIAGNOSIS — M810 Age-related osteoporosis without current pathological fracture: Secondary | ICD-10-CM | POA: Diagnosis present

## 2018-01-18 DIAGNOSIS — F329 Major depressive disorder, single episode, unspecified: Secondary | ICD-10-CM | POA: Diagnosis present

## 2018-01-18 DIAGNOSIS — G40901 Epilepsy, unspecified, not intractable, with status epilepticus: Secondary | ICD-10-CM | POA: Diagnosis present

## 2018-01-18 DIAGNOSIS — E785 Hyperlipidemia, unspecified: Secondary | ICD-10-CM | POA: Diagnosis present

## 2018-01-18 DIAGNOSIS — N189 Chronic kidney disease, unspecified: Secondary | ICD-10-CM | POA: Diagnosis present

## 2018-01-18 DIAGNOSIS — I130 Hypertensive heart and chronic kidney disease with heart failure and stage 1 through stage 4 chronic kidney disease, or unspecified chronic kidney disease: Secondary | ICD-10-CM | POA: Diagnosis present

## 2018-01-18 DIAGNOSIS — R1310 Dysphagia, unspecified: Secondary | ICD-10-CM | POA: Diagnosis not present

## 2018-01-18 DIAGNOSIS — I952 Hypotension due to drugs: Secondary | ICD-10-CM | POA: Diagnosis not present

## 2018-01-18 DIAGNOSIS — Z955 Presence of coronary angioplasty implant and graft: Secondary | ICD-10-CM | POA: Diagnosis not present

## 2018-01-18 DIAGNOSIS — R0689 Other abnormalities of breathing: Secondary | ICD-10-CM | POA: Diagnosis not present

## 2018-01-18 DIAGNOSIS — K529 Noninfective gastroenteritis and colitis, unspecified: Secondary | ICD-10-CM | POA: Diagnosis present

## 2018-01-18 DIAGNOSIS — Z85828 Personal history of other malignant neoplasm of skin: Secondary | ICD-10-CM | POA: Diagnosis not present

## 2018-01-18 DIAGNOSIS — E039 Hypothyroidism, unspecified: Secondary | ICD-10-CM | POA: Diagnosis present

## 2018-01-18 DIAGNOSIS — Z825 Family history of asthma and other chronic lower respiratory diseases: Secondary | ICD-10-CM | POA: Diagnosis not present

## 2018-01-18 DIAGNOSIS — J9600 Acute respiratory failure, unspecified whether with hypoxia or hypercapnia: Secondary | ICD-10-CM | POA: Diagnosis not present

## 2018-01-18 DIAGNOSIS — F17210 Nicotine dependence, cigarettes, uncomplicated: Secondary | ICD-10-CM | POA: Diagnosis present

## 2018-01-18 DIAGNOSIS — E876 Hypokalemia: Secondary | ICD-10-CM | POA: Diagnosis present

## 2018-01-18 DIAGNOSIS — G894 Chronic pain syndrome: Secondary | ICD-10-CM | POA: Diagnosis present

## 2018-01-18 DIAGNOSIS — J439 Emphysema, unspecified: Secondary | ICD-10-CM | POA: Diagnosis not present

## 2018-01-18 DIAGNOSIS — F419 Anxiety disorder, unspecified: Secondary | ICD-10-CM | POA: Diagnosis present

## 2018-01-18 DIAGNOSIS — K219 Gastro-esophageal reflux disease without esophagitis: Secondary | ICD-10-CM | POA: Diagnosis present

## 2018-01-18 LAB — CBC
HCT: 41 % (ref 36.0–46.0)
Hemoglobin: 12.8 g/dL (ref 12.0–15.0)
MCH: 27.8 pg (ref 26.0–34.0)
MCHC: 31.2 g/dL (ref 30.0–36.0)
MCV: 89.1 fL (ref 78.0–100.0)
PLATELETS: 284 10*3/uL (ref 150–400)
RBC: 4.6 MIL/uL (ref 3.87–5.11)
RDW: 18.6 % — ABNORMAL HIGH (ref 11.5–15.5)
WBC: 11.5 10*3/uL — ABNORMAL HIGH (ref 4.0–10.5)

## 2018-01-18 LAB — BASIC METABOLIC PANEL
Anion gap: 10 (ref 5–15)
BUN: 5 mg/dL — ABNORMAL LOW (ref 8–23)
CHLORIDE: 103 mmol/L (ref 98–111)
CO2: 26 mmol/L (ref 22–32)
CREATININE: 0.49 mg/dL (ref 0.44–1.00)
Calcium: 7.9 mg/dL — ABNORMAL LOW (ref 8.9–10.3)
GFR calc Af Amer: >60 mL/min (ref >60)
GFR calc non Af Amer: >60 mL/min (ref >60)
Glucose, Bld: 100 mg/dL — ABNORMAL HIGH (ref 70–99)
Potassium: 2.7 mmol/L — CL (ref 3.5–5.1)
Sodium: 139 mmol/L (ref 135–145)

## 2018-01-18 LAB — MAGNESIUM: Magnesium: 0.9 mg/dL — CL (ref 1.7–2.4)

## 2018-01-18 MED ORDER — POTASSIUM CHLORIDE 10 MEQ/100ML IV SOLN
10.0000 meq | INTRAVENOUS | Status: AC
  Administered 2018-01-18 (×6): 10 meq via INTRAVENOUS
  Filled 2018-01-18 (×6): qty 100

## 2018-01-18 MED ORDER — SODIUM CHLORIDE 0.9 % IV SOLN
200.0000 mg | Freq: Two times a day (BID) | INTRAVENOUS | Status: DC
  Administered 2018-01-18 – 2018-01-24 (×13): 200 mg via INTRAVENOUS
  Filled 2018-01-18 (×17): qty 20

## 2018-01-18 MED ORDER — MAGNESIUM SULFATE 4 GM/100ML IV SOLN
4.0000 g | Freq: Once | INTRAVENOUS | Status: AC
  Administered 2018-01-18: 4 g via INTRAVENOUS
  Filled 2018-01-18: qty 100

## 2018-01-18 MED ORDER — LEVETIRACETAM IN NACL 1000 MG/100ML IV SOLN
1000.0000 mg | Freq: Three times a day (TID) | INTRAVENOUS | Status: DC
  Administered 2018-01-18 – 2018-01-22 (×13): 1000 mg via INTRAVENOUS
  Filled 2018-01-18 (×14): qty 100

## 2018-01-18 NOTE — Progress Notes (Signed)
EEG Completed; Results Pending  

## 2018-01-18 NOTE — Progress Notes (Signed)
Pt had another seizure while EEG tech was setting up and getting the wired connected to scalp. She seized for about a minute at 338pm. Seizure was witnessed. Patient shakes/ shivers a bit after her siezures and states she is cold. EEG tech at bedside. MD aware.  Dreonna Hussein Rica Mote, RN 3:56 PM

## 2018-01-18 NOTE — Progress Notes (Signed)
Pt called out for [place] to change her bed and when we started to roll her she started seizing. 8:05 seized for 55 sec, 8:07 for 20 sec,8:09 for 50 sec, 8:10 for 10 sec, 8:12 for 5 sec. Now patient is talking and complaining of being very cold and being tearful. Gave 2 of ativan at 8:09.All seizure episodes witnessed. Will continue to monitor patient  Dash Cardarelli Rica Mote, RN

## 2018-01-18 NOTE — Procedures (Signed)
[place] A. Merlene Laughter, MD     [email or web address]           HISTORY: This is a 67 year old female who presents with multiple seizures.  MEDICATIONS: Scheduled Meds: . enoxaparin (LOVENOX) injection  40 mg Subcutaneous Q24H  . fluticasone furoate-vilanterol  1 puff Inhalation Daily  . ipratropium-albuterol  3 mL Nebulization TID   Continuous Infusions: . 0.9 % NaCl with KCl 40 mEq / L 125 mL/hr (01/18/18 1215)  . famotidine (PEPCID) IV Stopped (01/18/18 1056)  . lacosamide (VIMPAT) IV Stopped (01/18/18 0951)  . levETIRAcetam 1,000 mg (01/18/18 1646)   PRN Meds:.acetaminophen **OR** acetaminophen, hydrALAZINE, ipratropium-albuterol, LORazepam, ondansetron **OR** ondansetron (ZOFRAN) IV  Prior to Admission medications   Medication Sig Start Date End Date Taking? Authorizing Provider  albuterol (PROVENTIL HFA) 108 (90 Base) MCG/ACT inhaler Inhale into the lungs.   Yes [provider]  diphenhydramine-acetaminophen (TYLENOL PM EXTRA STRENGTH) 25-500 MG TABS tablet Take 1 tablet by mouth at bedtime as needed (pain).    Yes [provider]  EPINEPHrine 0.3 mg/0.3 mL IJ SOAJ injection Inject 0.3 mg into the muscle as needed.   Yes [provider]  ipratropium-albuterol (DUONEB) 0.5-2.5 (3) MG/3ML SOLN Take 3 mLs by nebulization every 6 (six) hours as needed (shortness of breathe).    Yes [provider]  Morphine-Naltrexone (EMBEDA) 30-1.2 MG CPCR Take 1 capsule by mouth 2 (two) times daily.   Yes [provider]  naloxone (NARCAN) nasal spray 4 mg/0.1 mL Place into the nose. 08/18/17  Yes [provider]  alendronate (FOSAMAX) 70 MG tablet Take 1 tablet (70 mg total) by mouth every Monday. Take with a full glass of water on an empty stomach. 12/05/17   Dettinger, Fransisca Kaufmann, MD  aspirin EC 81 MG tablet Take 81 mg by mouth daily.    [provider]  atorvastatin (LIPITOR) 40 MG tablet Take 40 mg by mouth daily.     [provider]  cetirizine (ZYRTEC) 10 MG tablet Take 1 tablet (10 mg total) by mouth daily. 08/31/17   Dettinger, Fransisca Kaufmann, MD  Cholecalciferol (VITAMIN D-3) 5000 units TABS Take 5,000 Units by mouth daily.    [provider]  cyclobenzaprine (FLEXERIL) 10 MG tablet Take 10 mg by mouth 2 (two) times daily as needed for muscle spasms.    [provider]  DULoxetine (CYMBALTA) 30 MG capsule Take 30 mg by mouth daily.    [provider]  Ferrous Sulfate (IRON) 325 (65 Fe) MG TABS Take 1 tablet (325 mg total) by mouth daily. 08/27/17   Velvet Bathe, MD  fluticasone (FLONASE) 50 MCG/ACT nasal spray Place 2 sprays into both nostrils daily. 08/31/17   Dettinger, Fransisca Kaufmann, MD  fluticasone furoate-vilanterol (BREO ELLIPTA) 200-25 MCG/INH AEPB Inhale 1 puff into the lungs daily. 08/31/17   Dettinger, Fransisca Kaufmann, MD  Fluticasone-Salmeterol (ADVAIR) 250-50 MCG/DOSE AEPB Inhale 1 puff into the lungs 2 (two) times daily.    [provider]  gabapentin (NEURONTIN) 600 MG tablet Take 1 tablet (600 mg total) by mouth 4 (four) times daily. 10/26/17   Dettinger, Fransisca Kaufmann, MD  levETIRAcetam (KEPPRA) 1000 MG tablet TAKE 1 TABLET BY MOUTH AT BEDTIME 12/28/17   Dettinger, Fransisca Kaufmann, MD  levothyroxine (SYNTHROID, LEVOTHROID) 50 MCG tablet Take 50 mcg by mouth daily before breakfast.    [provider]  losartan (COZAAR) 100 MG tablet TAKE 1 2 (ONE HALF) TABLET BY MOUTH ONCE DAILY 01/02/18   [provider]  Magnesium 400 MG CAPS Take 400 mg by mouth daily.    [provider]  metoprolol tartrate (LOPRESSOR) 25 MG tablet Take 25 mg by mouth 2 (two) times daily.    [provider]  Multiple Vitamin (MULTIVITAMIN) tablet Take 1 tablet by mouth daily.    [provider]  Omega-3 Fatty Acids (FISH OIL) 1000 MG CAPS Take 1,000 mg by mouth daily.    [provider]      ANALYSIS: A 16 channel recording using standard 10 20  measurements is conducted for 21 minutes.   The background activity gets as high as 8 hertz.  There is beta activity observed in the frontal areas.  There is a 6 is amount of a spindles seen throughout the recording.  Occasional K complexes are also observed.  Photic stimulation and hyperventilation are not carried out.  There is a brief episode of right temporal theta slowing at T4- T6.  There is no epileptiform activity is observed.   IMPRESSION: 1.  Single episode of right temporal slowing is observed.  This can be associated with epileptic focus.  There is no evidence of continues electrographic seizures. 2.  Excessive spindles are observed.  Undoubtedly, this is due to benzodiazepine.      Massiah Longanecker A. Merlene Laughter, M.D.  Diplomate, Tax adviser of Psychiatry and Neurology ( Neurology).

## 2018-01-18 NOTE — Consult Note (Signed)
[facility] A. Merlene Laughter, MD     [email or web address]          Tasha Clarke is an 67 y.o. female.   ASSESSMENT/PLAN: 1. Convulsive STATUS EPILEPTICUS:  This is likely due to abrupt discontinuation of the seizure medication due to intractable nausea and vomiting.The patient will be given a stat dose of Vimpat.  The Keppra will be increased.  EEG is obtained.  Continue with p.r.n. Doses of Ativan.  If further doses of Ativan are needed, she may need to be intubated is for airway protection.  We will try to obtain notes from her previous neurologist. tremor.   2.  Mild-to-moderate encephalopathy due to multiple seizures and medication effect:   A plain CT scan will be obtained however. 3.  Persistent nausea vomiting due to presumed gastroenteritis:    The patient is a 67 year old white female who presents to the hospital with intractable nausea and vomiting.  She has not been able to keep anything down including her medications.  She started having seizures in the hospital.  She has had multiple seizures and was sent to the hospital.  She has been given IV Keppra.  The dose has been the same as she typically gets at baseline for seizures.  The history is obtained from the chart and also the patient.  It appears that she has 15 year history of seizures.  She previously was being treated in the [place] area but located here recently.  He medical records are not available.  It appears that she has been well controlled and has not had a seizure in over a year.  The description of the seizures by the nurse indicates that she has stiffening and and the shaking with alteration of consciousness.  The episodes last between 20-60 seconds.  She had series of 3 back-to-back last night and was drowsy afterwards.  She was given IV Ativan.  The patient reports that she is tired and fatigued right now but has no other complaints.  The review of systems limited given the drowsiness but seems otherwise  unrevealing.    GENERAL:  The patient is laying in bed and in no acute distress although she appears to be in some discomfort.  HEENT:   Neck is supple and no evidence of trauma noted  ABDOMEN: soft  EXTREMITIES: No edema ; there is marked arthritic changes of the knees bilaterally.  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  There is marked pain with manipulation of the legs bilaterally especially on the right.   BACK: normal  SKIN: Normal by inspection.    MENTAL STATUS:  She lays in bed with eyes closed.  She opens her eyes to verbal commands.  She knows that she is in the [facility] but is not oriented to city.  She does follow commands well and speaking full 3-4 word sentences that is coherent.  She does require repeat stimulation to keep her eyes open.  CRANIAL NERVES: Pupils are equal, round and reactive to light; extra ocular movements are full, there is no significant nystagmus; visual fields are full; upper and lower facial muscles are normal in strength and symmetric, there is no flattening of the nasolabial folds; tongue is midline.  MOTOR:   She moves upper extremities well.  She has antigravity strength in the legs.  There appears to be difficulties moving the toes which she tells me she has had for a long time after work related injury.  COORDINATION: Left finger  to nose is normal, right finger to nose is normal, No rest tremor; no intention tremor; no postural tremor; no bradykinesia.  REFLEXES: Deep tendon reflexes are symmetrical and normal. Plantar reflexes are extensor bilaterally.   SENSATION: Normal to pain.     Blood pressure (!) 159/69, pulse 93, temperature 99 F (37.2 C), temperature source Oral, resp. rate 20, height '5\' 5"'  (1.651 m), weight 192 lb 3.9 oz (87.2 kg), SpO2 98 %.  Past Medical History:  Diagnosis Date  . Allergy   . Anemia   . Anxiety   . Arthritis   . Asthma   . Blood transfusion without reported diagnosis   . Cataract   .  CHF (congestive heart failure) ([place])   . Chronic kidney disease   . Clotting disorder ([place])   . COPD (chronic obstructive pulmonary disease) ([place])   . Depression   . Diabetes mellitus without complication ([place])   . Emphysema of lung ([place])   . GERD (gastroesophageal reflux disease)   . Hyperlipidemia   . Hypertension   . Neuromuscular disorder ([place])   . Osteoporosis   . Oxygen deficiency   . Seizures ([place])   . Skin cancer    2 areas removed more than 10 years ago  . Sleep apnea 1985   wears cpap nightly  . Thyroid disease     Past Surgical History:  Procedure Laterality Date  . APPENDECTOMY    . EYE SURGERY    . FRACTURE SURGERY    . heart stents    . JOINT REPLACEMENT    . SPINAL CORD STIMULATOR IMPLANT  2015  . SPINE SURGERY      Family History  Problem Relation Age of Onset  . Arthritis Mother   . Asthma Mother   . Cancer Mother        lung  . Depression Mother   . Diabetes Mother   . Arthritis Father   . Asthma Father   . Depression Father   . Asthma Sister   . Depression Sister   . Diabetes Sister   . Arthritis Maternal Grandmother   . Asthma Maternal Grandmother   . Depression Maternal Grandmother   . Arthritis Maternal Grandfather   . Asthma Maternal Grandfather   . Arthritis Paternal Grandmother   . Asthma Paternal Grandmother   . Arthritis Paternal Grandfather   . Asthma Paternal Grandfather   . COPD Paternal Grandfather   . Diabetes Sister   . Mental illness Sister   . Anemia Sister   . Hypertension Son   . Post-traumatic stress disorder Son   . Allergies Daughter     Social History:  reports that she has been smoking cigarettes.  She has a 32.25 pack-year smoking history. She has never used smokeless tobacco. She reports that she does not drink alcohol or use drugs.  Allergies:  Allergies  Allergen Reactions  . Cephalexin Hives  . Ketorolac Tromethamine Hives  . Lac Bovis Nausea And Vomiting  . Imdur  [Isosorbide Dinitrate]   .  Iodinated Diagnostic Agents   . Other Other (See Comments)    Alka seltzer causes blurred vision, fainting  . Sodium Bicarbonate-Citric Acid   . Milk-Related Compounds Nausea And Vomiting    Medications: Prior to Admission medications   Medication Sig Start Date End Date Taking? Authorizing Provider  albuterol (PROVENTIL HFA) 108 (90 Base) MCG/ACT inhaler Inhale into the lungs.   Yes [provider]  diphenhydramine-acetaminophen (TYLENOL PM EXTRA STRENGTH) 25-500 MG TABS tablet  Take 1 tablet by mouth at bedtime as needed (pain).    Yes [provider]  EPINEPHrine 0.3 mg/0.3 mL IJ SOAJ injection Inject 0.3 mg into the muscle as needed.   Yes [provider]  ipratropium-albuterol (DUONEB) 0.5-2.5 (3) MG/3ML SOLN Take 3 mLs by nebulization every 6 (six) hours as needed (shortness of breathe).    Yes [provider]  Morphine-Naltrexone (EMBEDA) 30-1.2 MG CPCR Take 1 capsule by mouth 2 (two) times daily.   Yes [provider]  naloxone (NARCAN) nasal spray 4 mg/0.1 mL Place into the nose. 08/18/17  Yes [provider]  alendronate (FOSAMAX) 70 MG tablet Take 1 tablet (70 mg total) by mouth every Monday. Take with a full glass of water on an empty stomach. 12/05/17   Dettinger, Fransisca Kaufmann, MD  aspirin EC 81 MG tablet Take 81 mg by mouth daily.    [provider]  atorvastatin (LIPITOR) 40 MG tablet Take 40 mg by mouth daily.    [provider]  cetirizine (ZYRTEC) 10 MG tablet Take 1 tablet (10 mg total) by mouth daily. 08/31/17   Dettinger, Fransisca Kaufmann, MD  Cholecalciferol (VITAMIN D-3) 5000 units TABS Take 5,000 Units by mouth daily.    [provider]  cyclobenzaprine (FLEXERIL) 10 MG tablet Take 10 mg by mouth 2 (two) times daily as needed for muscle spasms.    [provider]  DULoxetine (CYMBALTA) 30 MG capsule Take 30 mg by mouth daily.    [provider]  Ferrous Sulfate (IRON) 325 (65 Fe) MG TABS  Take 1 tablet (325 mg total) by mouth daily. 08/27/17   Velvet Bathe, MD  fluticasone (FLONASE) 50 MCG/ACT nasal spray Place 2 sprays into both nostrils daily. 08/31/17   Dettinger, Fransisca Kaufmann, MD  fluticasone furoate-vilanterol (BREO ELLIPTA) 200-25 MCG/INH AEPB Inhale 1 puff into the lungs daily. 08/31/17   Dettinger, Fransisca Kaufmann, MD  Fluticasone-Salmeterol (ADVAIR) 250-50 MCG/DOSE AEPB Inhale 1 puff into the lungs 2 (two) times daily.    [provider]  gabapentin (NEURONTIN) 600 MG tablet Take 1 tablet (600 mg total) by mouth 4 (four) times daily. 10/26/17   Dettinger, Fransisca Kaufmann, MD  levETIRAcetam (KEPPRA) 1000 MG tablet TAKE 1 TABLET BY MOUTH AT BEDTIME 12/28/17   Dettinger, Fransisca Kaufmann, MD  levothyroxine (SYNTHROID, LEVOTHROID) 50 MCG tablet Take 50 mcg by mouth daily before breakfast.    [provider]  losartan (COZAAR) 100 MG tablet TAKE 1 2 (ONE HALF) TABLET BY MOUTH ONCE DAILY 01/02/18   [provider]  Magnesium 400 MG CAPS Take 400 mg by mouth daily.    [provider]  metoprolol tartrate (LOPRESSOR) 25 MG tablet Take 25 mg by mouth 2 (two) times daily.    [provider]  Multiple Vitamin (MULTIVITAMIN) tablet Take 1 tablet by mouth daily.    [provider]  Omega-3 Fatty Acids (FISH OIL) 1000 MG CAPS Take 1,000 mg by mouth daily.    [provider]    Scheduled Meds: . enoxaparin (LOVENOX) injection  40 mg Subcutaneous Q24H  . fluticasone furoate-vilanterol  1 puff Inhalation Daily  . ipratropium-albuterol  3 mL Nebulization TID   Continuous Infusions: . 0.9 % NaCl with KCl 40 mEq / L 125 mL/hr (01/18/18 0828)  . famotidine (PEPCID) IV Stopped (01/18/18 0116)  . lacosamide (VIMPAT) IV    . levETIRAcetam    . magnesium sulfate 1 - 4 g bolus IVPB 4 g (01/18/18 0836)  .  potassium chloride 10 mEq (01/18/18 0836)   PRN Meds:.acetaminophen **OR** acetaminophen, hydrALAZINE, ipratropium-albuterol, LORazepam, ondansetron **OR**  ondansetron (ZOFRAN) IV     Results for orders placed or performed during the hospital encounter of 01/17/18 (from the past 48 hour(s))  Lipase, blood     Status: None   Collection Time: 01/17/18  7:36 AM  Result Value Ref Range   Lipase 27 11 - 51 U/L    Comment: Performed at [facility], [address], [place], [place] [address]  Comprehensive metabolic panel     Status: Abnormal   Collection Time: 01/17/18  7:36 AM  Result Value Ref Range   Sodium 138 135 - 145 mmol/L   Potassium 2.7 (LL) 3.5 - 5.1 mmol/L    Comment: CRITICAL RESULT CALLED TO, READ BACK BY AND VERIFIED WITH: PATRAW,B AT 8:20AM ON 01/17/18 BY FESTERMAN,C    Chloride 102 98 - 111 mmol/L    Comment: Please note change in reference range.   CO2 23 22 - 32 mmol/L   Glucose, Bld 164 (H) 70 - 99 mg/dL    Comment: Please note change in reference range.   BUN 10 8 - 23 mg/dL    Comment: Please note change in reference range.   Creatinine, Ser 0.68 0.44 - 1.00 mg/dL   Calcium 9.1 8.9 - 10.3 mg/dL   Total Protein 7.0 6.5 - 8.1 g/dL   Albumin 3.4 (L) 3.5 - 5.0 g/dL   AST 23 15 - 41 U/L   ALT 15 0 - 44 U/L    Comment: Please note change in reference range.   Alkaline Phosphatase 92 38 - 126 U/L   Total Bilirubin 0.6 0.3 - 1.2 mg/dL   GFR calc non Af Amer >60 >60 mL/min   GFR calc Af Amer >60 >60 mL/min    Comment: (NOTE) The eGFR has been calculated using the CKD EPI equation. This calculation has not been validated in all clinical situations. eGFR's persistently <60 mL/min signify possible Chronic Kidney Disease.    Anion gap 13 5 - 15    Comment: Performed at [facility], [address], [place], [place] [address]  CBC     Status: Abnormal   Collection Time: 01/17/18  7:36 AM  Result Value Ref Range   WBC 10.2 4.0 - 10.5 K/uL   RBC 4.86 3.87 - 5.11 MIL/uL   Hemoglobin 14.3 12.0 - 15.0 g/dL   HCT 43.2 36.0 - 46.0 %   MCV 88.9 78.0 - 100.0 fL   MCH 29.4 26.0 - 34.0 pg   MCHC 33.1 30.0 - 36.0 g/dL   RDW  18.8 (H) 11.5 - 15.5 %   Platelets 293 150 - 400 K/uL    Comment: Performed at [facility], [address] [place], [place] [address]  Differential     Status: Abnormal   Collection Time: 01/17/18  7:36 AM  Result Value Ref Range   Neutrophils Relative % 80 %   Neutro Abs 8.0 (H) 1.7 - 7.7 K/uL   Lymphocytes Relative 9 %   Lymphs Abs 0.9 0.7 - 4.0 K/uL   Monocytes Relative 10 %   Monocytes Absolute 1.0 0.1 - 1.0 K/uL   Eosinophils Relative 1 %   Eosinophils Absolute 0.1 0.0 - 0.7 K/uL   Basophils Relative 0 %   Basophils Absolute 0.0 0.0 - 0.1 K/uL    Comment: Performed at [facility], [address], [place], [place] [address]  Urinalysis, Routine w reflex microscopic  Status: Abnormal   Collection Time: 01/17/18 11:44 AM  Result Value Ref Range   Color, Urine YELLOW YELLOW   APPearance CLEAR CLEAR   Specific Gravity, Urine 1.014 1.005 - 1.030   pH 6.0 5.0 - 8.0   Glucose, UA NEGATIVE NEGATIVE mg/dL   Hgb urine dipstick NEGATIVE NEGATIVE   Bilirubin Urine NEGATIVE NEGATIVE   Ketones, ur NEGATIVE NEGATIVE mg/dL   Protein, ur 30 (A) NEGATIVE mg/dL   Nitrite NEGATIVE NEGATIVE   Leukocytes, UA NEGATIVE NEGATIVE   RBC / HPF 0-5 0 - 5 RBC/hpf   WBC, UA 0-5 0 - 5 WBC/hpf   Bacteria, UA RARE (A) NONE SEEN   Mucus PRESENT    Hyaline Casts, UA PRESENT     Comment: Performed at [facility], [address] [place], [place] [address]  Glucose, capillary     Status: Abnormal   Collection Time: 01/17/18  4:17 PM  Result Value Ref Range   Glucose-Capillary 115 (H) 70 - 99 mg/dL   Comment 1 Notify RN    Comment 2 Document in Chart   MRSA PCR Screening     Status: None   Collection Time: 01/17/18  4:49 PM  Result Value Ref Range   MRSA by PCR NEGATIVE NEGATIVE    Comment:        The GeneXpert MRSA Assay (FDA approved for NASAL specimens only), is one component of a comprehensive MRSA colonization surveillance program. It is not intended to diagnose MRSA infection nor to  guide or monitor treatment for MRSA infections. Performed at [facility], [address], [place], [place] [address]   Basic metabolic panel     Status: Abnormal   Collection Time: 01/18/18  5:15 AM  Result Value Ref Range   Sodium 139 135 - 145 mmol/L   Potassium 2.7 (LL) 3.5 - 5.1 mmol/L    Comment: CRITICAL RESULT CALLED TO, READ BACK BY AND VERIFIED WITH: DANIEL,J AT 6:35AM ON 01/18/18 BY FESTERMAN,C    Chloride 103 98 - 111 mmol/L    Comment: Please note change in reference range.   CO2 26 22 - 32 mmol/L   Glucose, Bld 100 (H) 70 - 99 mg/dL    Comment: Please note change in reference range.   BUN 5 (L) 8 - 23 mg/dL    Comment: Please note change in reference range.   Creatinine, Ser 0.49 0.44 - 1.00 mg/dL   Calcium 7.9 (L) 8.9 - 10.3 mg/dL   GFR calc non Af Amer >60 >60 mL/min   GFR calc Af Amer >60 >60 mL/min    Comment: (NOTE) The eGFR has been calculated using the CKD EPI equation. This calculation has not been validated in all clinical situations. eGFR's persistently <60 mL/min signify possible Chronic Kidney Disease.    Anion gap 10 5 - 15    Comment: Performed at [facility], [address], [place], [place] [address]  CBC     Status: Abnormal   Collection Time: 01/18/18  5:15 AM  Result Value Ref Range   WBC 11.5 (H) 4.0 - 10.5 K/uL   RBC 4.60 3.87 - 5.11 MIL/uL   Hemoglobin 12.8 12.0 - 15.0 g/dL   HCT 41.0 36.0 - 46.0 %   MCV 89.1 78.0 - 100.0 fL   MCH 27.8 26.0 - 34.0 pg   MCHC 31.2 30.0 - 36.0 g/dL   RDW 18.6 (H) 11.5 - 15.5 %   Platelets 284 150 - 400 K/uL    Comment: Performed  at [facility], [address], [place], [place] [address]  Magnesium     Status: Abnormal   Collection Time: 01/18/18  5:15 AM  Result Value Ref Range   Magnesium 0.9 (LL) 1.7 - 2.4 mg/dL    Comment: CRITICAL RESULT CALLED TO, READ BACK BY AND VERIFIED WITH: DANIEL,J AT 6:35AM ON 01/18/18 BY [facility] Performed at [facility], [address], [place], [place] [address]      Studies/Results:     Ellie Bryand A. Merlene Laughter, M.D.  Diplomate, Tax adviser of Psychiatry and Neurology ( Neurology). 01/18/2018, 8:56 AM

## 2018-01-18 NOTE — Progress Notes (Signed)
PROGRESS NOTE    Tasha Clarke  DZH:[id number] DOB: [birth year] DOA: 01/17/2018 PCP: Dettinger, Fransisca Kaufmann, MD    Brief Narrative:  67 year old female with COPD, seizure disorder on Keppra, presents to the hospital with vomiting and diarrhea consistent with gastroenteritis.  She was unable to take any of her medications for 1 to 2 days prior to admission.  She was noted to be hypokalemic on admission experiencing seizures on admission and was admitted for supportive management.  Shortly after admission, she began experiencing several seizures.  She is receiving Ativan.  Currently on intravenous Keppra.  Neurology has been consulted.   Assessment & Plan:   Active Problems:   COPD (chronic obstructive pulmonary disease) (HCC)   Chronic pain syndrome   Hypothyroid   Hypertension   Hypokalemia   Seizure (HCC)   Gastroenteritis   1. Acute gastroenteritis.  GI pathogen panel was ordered, but it does not appear that she has had further diarrhea.  She does not have any further bowel movements today, will likely discontinue.  Abnormality noted on CT within the stomach, will need to be further evaluated with endoscopy once she is medically stable  2. Seizure disorder. Patient is chronically on Keppra.  Her husband reports that she is doing fairly well and has not had a seizure in over a year.  She was unable to take these medications due to her nausea and vomiting for approximately 1 to 2 days prior to admission.  Since admission, she is had several episodes of seizures.  These usually resolve after receiving Ativan.  EEG has been ordered.  Will consult neurology.  She is currently on intravenous Keppra. 3. Hypokalemia.  Related to GI losses.  Replace.  Magnesium is also low. 4. COPD.  Wheezing has improved.  Continue on bronchodilators. 5. Chronic pain syndrome.  Continues to be somnolent.  Will hold off on further pain medications for now. 6. Hypothyroidism.  Resume levothyroxine once her mental  status is improved and she is able to take p.o. medications.   DVT prophylaxis: Lovenox Code Status: Full code Family Communication: No family present Disposition Plan: Discharge home once improved   Consultants:   neurology  Procedures:     Antimicrobials:       Subjective: Staff reports no seizures overnight. No vomiting or diarrhea. Overnight she felt that a seizure may be coming on and asked for ativan. After receiving ativan, she did not have any episodes. This morning it was noted that she had several episodes of seizures. These appear to have resolved after receiving ativan  Objective: Vitals:   01/18/18 0500 01/18/18 0600 01/18/18 0700 01/18/18 0819  BP:  (!) 180/95 (!) 162/92   Pulse: 78 91 88   Resp: (!) 27 20 (!) 24   Temp:      TempSrc:      SpO2: 99% 98% 93% 98%  Weight: 87.2 kg (192 lb 3.9 oz)     Height:        Intake/Output Summary (Last 24 hours) at 01/18/2018 0830 Last data filed at 01/18/2018 0011 Gross per 24 hour  Intake 200 ml  Output -  Net 200 ml   Filed Weights   01/17/18 0659 01/17/18 1709 01/18/18 0500  Weight: 99.8 kg (220 lb) 87.2 kg (192 lb 3.9 oz) 87.2 kg (192 lb 3.9 oz)    Examination:  General exam: somnolent, laying in bed Respiratory system: Clear to auscultation. Respiratory effort normal. Cardiovascular system: S1 & S2 heard, RRR. No JVD, murmurs,  rubs, gallops or clicks.  Gastrointestinal system: Abdomen is nondistended, soft and nontender. No organomegaly or masses felt. Normal bowel sounds heard. Central nervous system: No focal neurological deficits. Extremities: no edema bilaterally Skin: No rashes, lesions or ulcers Psychiatry: lethargic    Data Reviewed: I have personally reviewed following labs and imaging studies  CBC: Recent Labs  Lab 01/17/18 0736 01/18/18 0515  WBC 10.2 11.5*  NEUTROABS 8.0*  --   HGB 14.3 12.8  HCT 43.2 41.0  MCV 88.9 89.1  PLT 293 242   Basic Metabolic Panel: Recent Labs    Lab 01/17/18 0736 01/18/18 0515  NA 138 139  K 2.7* 2.7*  CL 102 103  CO2 23 26  GLUCOSE 164* 100*  BUN 10 5*  CREATININE 0.68 0.49  CALCIUM 9.1 7.9*  MG  --  0.9*   GFR: Estimated Creatinine Clearance: 75.5 mL/min (by C-G formula based on SCr of 0.49 mg/dL). Liver Function Tests: Recent Labs  Lab 01/17/18 0736  AST 23  ALT 15  ALKPHOS 92  BILITOT 0.6  PROT 7.0  ALBUMIN 3.4*   Recent Labs  Lab 01/17/18 0736  LIPASE 27   No results for input(s): AMMONIA in the last 168 hours. Coagulation Profile: No results for input(s): INR, PROTIME in the last 168 hours. Cardiac Enzymes: No results for input(s): CKTOTAL, CKMB, CKMBINDEX, TROPONINI in the last 168 hours. BNP (last 3 results) No results for input(s): PROBNP in the last 8760 hours. HbA1C: No results for input(s): HGBA1C in the last 72 hours. CBG: Recent Labs  Lab 01/17/18 1617  GLUCAP 115*   Lipid Profile: No results for input(s): CHOL, HDL, LDLCALC, TRIG, CHOLHDL, LDLDIRECT in the last 72 hours. Thyroid Function Tests: No results for input(s): TSH, T4TOTAL, FREET4, T3FREE, THYROIDAB in the last 72 hours. Anemia Panel: No results for input(s): VITAMINB12, FOLATE, FERRITIN, TIBC, IRON, RETICCTPCT in the last 72 hours. Sepsis Labs: No results for input(s): PROCALCITON, LATICACIDVEN in the last 168 hours.  Recent Results (from the past 240 hour(s))  MRSA PCR Screening     Status: None   Collection Time: 01/17/18  4:49 PM  Result Value Ref Range Status   MRSA by PCR NEGATIVE NEGATIVE Final    Comment:        The GeneXpert MRSA Assay (FDA approved for NASAL specimens only), is one component of a comprehensive MRSA colonization surveillance program. It is not intended to diagnose MRSA infection nor to guide or monitor treatment for MRSA infections. Performed at [facility], [address] [place], [place] [address]          Radiology Studies: Ct Abdomen Pelvis Wo Contrast  Result Date:  01/17/2018 CLINICAL DATA:  Vomiting, diarrhea and body aches. EXAM: CT ABDOMEN AND PELVIS WITHOUT CONTRAST TECHNIQUE: Multidetector CT imaging of the abdomen and pelvis was performed following the standard protocol without IV contrast. COMPARISON:  None. FINDINGS: Lower chest: No acute abnormality. Hepatobiliary: No focal liver abnormality is seen. Status post cholecystectomy. No biliary dilatation. Pancreas: The pancreas appears atrophic. No obvious mass or inflammation by unenhanced CT. Spleen: Normal in size without focal abnormality. Adrenals/Urinary Tract: Nodularity along the superior aspect of the right adrenal gland measures approximately 13 mm and demonstrates low internal density of 4 Hounsfield units. This most likely corresponds to a benign adenoma. The left adrenal gland appears unremarkable. The kidneys show no evidence of hydronephrosis. There is a tiny nonobstructing calculus within the medial aspect of the mid right kidney measuring approximately 2 mm.  The bladder is unremarkable. Stomach/Bowel: The stomach is partially distended with fluid. Along the posterior wall of the fundus, there is some potential nodular thickening with polypoid extension into the lumen. A mass cannot be excluded. This region of thickening measures roughly 3 cm in estimated diameter. Posterior aspect of the gastric fundus and cardia also demonstrates an area of focal outpouching containing a gas bubble which could represent a small diverticulum versus area of ulceration. No evidence to suggest gastric perforation or obstruction. There is some fluid scattered throughout the small bowel which is nonspecific but may be consistent with enteritis. No evidence of small bowel obstruction. The colon is decompressed. No free air or focal abscess identified. Vascular/Lymphatic: No significant vascular findings are present. No enlarged abdominal or pelvic lymph nodes. Reproductive: Status post hysterectomy. No adnexal masses. Other:  Spinal stimulator generator is positioned within the subcutaneous tissues of the left trans lumbar region with electrodes extending into the lower thoracic canal. Musculoskeletal: No acute or significant osseous findings. IMPRESSION: 1. Gastric wall thickening along the posterior aspect of the fundus and cardia with potential polypoid extension into the gastric lumen. Underlying gastric mass cannot be excluded. There also may be component of a small diverticulum versus area of ulceration along the posterior wall of the proximal stomach. Correlation with endoscopy may be helpful if there are any symptoms suggestive of gastric inflammation or pathology. 2. Possible enteritis involving the small bowel with scattered fluid throughout the small bowel. 3. 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 Electronically Signed   By: Aletta Edouard M.D.   On: 01/17/2018 10:31        Scheduled Meds: . enoxaparin (LOVENOX) injection  40 mg Subcutaneous Q24H  . fluticasone furoate-vilanterol  1 puff Inhalation Daily  . ipratropium-albuterol  3 mL Nebulization TID   Continuous Infusions: . 0.9 % NaCl with KCl 40 mEq / L 125 mL/hr (01/18/18 0828)  . famotidine (PEPCID) IV Stopped (01/18/18 0116)  . levETIRAcetam 1,000 mg (01/18/18 0559)  . magnesium sulfate 1 - 4 g bolus IVPB    . potassium chloride       LOS: 0 days    Time spent: 30 mins    Kathie Dike, MD [facility] Pager [phone]  If 7PM-7AM, please contact night-coverage [email or web address] Password [email or web address] 01/18/2018, 8:30 AM

## 2018-01-19 ENCOUNTER — Inpatient Hospital Stay (HOSPITAL_COMMUNITY): Payer: Medicare PPO

## 2018-01-19 ENCOUNTER — Inpatient Hospital Stay (HOSPITAL_COMMUNITY): Payer: Medicare PPO | Admitting: Anesthesiology

## 2018-01-19 DIAGNOSIS — G40901 Epilepsy, unspecified, not intractable, with status epilepticus: Principal | ICD-10-CM

## 2018-01-19 DIAGNOSIS — R0689 Other abnormalities of breathing: Secondary | ICD-10-CM

## 2018-01-19 DIAGNOSIS — I952 Hypotension due to drugs: Secondary | ICD-10-CM

## 2018-01-19 DIAGNOSIS — R569 Unspecified convulsions: Secondary | ICD-10-CM

## 2018-01-19 LAB — BLOOD GAS, ARTERIAL
Acid-base deficit: 1.3 mmol/L (ref 0.0–2.0)
Bicarbonate: 23.4 mmol/L (ref 20.0–28.0)
Drawn by: 234301
FIO2: 100
O2 Saturation: 99.8 %
PATIENT TEMPERATURE: 37
PCO2 ART: 38.1 mmHg (ref 32.0–48.0)
PEEP: 5 cmH2O
PO2 ART: 442 mmHg — AB (ref 83.0–108.0)
RATE: 16 resp/min
VT: 420 mL
pH, Arterial: 7.395 (ref 7.350–7.450)

## 2018-01-19 LAB — CBC
HCT: 39.3 % (ref 36.0–46.0)
HEMOGLOBIN: 12.1 g/dL (ref 12.0–15.0)
MCH: 28.3 pg (ref 26.0–34.0)
MCHC: 30.8 g/dL (ref 30.0–36.0)
MCV: 91.8 fL (ref 78.0–100.0)
Platelets: 245 10*3/uL (ref 150–400)
RBC: 4.28 MIL/uL (ref 3.87–5.11)
RDW: 18.9 % — ABNORMAL HIGH (ref 11.5–15.5)
WBC: 8.2 10*3/uL (ref 4.0–10.5)

## 2018-01-19 LAB — BASIC METABOLIC PANEL
ANION GAP: 6 (ref 5–15)
BUN: 6 mg/dL — ABNORMAL LOW (ref 8–23)
CALCIUM: 7.4 mg/dL — AB (ref 8.9–10.3)
CHLORIDE: 108 mmol/L (ref 98–111)
CO2: 26 mmol/L (ref 22–32)
Creatinine, Ser: 0.47 mg/dL (ref 0.44–1.00)
GFR calc non Af Amer: >60 mL/min (ref >60)
GLUCOSE: 91 mg/dL (ref 70–99)
Potassium: 4.2 mmol/L (ref 3.5–5.1)
Sodium: 140 mmol/L (ref 135–145)

## 2018-01-19 LAB — GLUCOSE, CAPILLARY
GLUCOSE-CAPILLARY: 66 mg/dL — AB (ref 70–99)
GLUCOSE-CAPILLARY: 84 mg/dL (ref 70–99)
Glucose-Capillary: 59 mg/dL — ABNORMAL LOW (ref 70–99)

## 2018-01-19 LAB — TRIGLYCERIDES: Triglycerides: 329 mg/dL — ABNORMAL HIGH (ref <150)

## 2018-01-19 LAB — MAGNESIUM: Magnesium: 1.7 mg/dL (ref 1.7–2.4)

## 2018-01-19 MED ORDER — SUCCINYLCHOLINE CHLORIDE 20 MG/ML IJ SOLN
INTRAMUSCULAR | Status: DC | PRN
  Administered 2018-01-19: 100 mg via INTRAVENOUS

## 2018-01-19 MED ORDER — CHLORHEXIDINE GLUCONATE CLOTH 2 % EX PADS
6.0000 | MEDICATED_PAD | Freq: Every day | CUTANEOUS | Status: DC
  Administered 2018-01-19 – 2018-01-24 (×5): 6 via TOPICAL

## 2018-01-19 MED ORDER — ATORVASTATIN CALCIUM 40 MG PO TABS
40.0000 mg | ORAL_TABLET | Freq: Every day | ORAL | Status: DC
  Administered 2018-01-19 – 2018-01-20 (×2): 40 mg via ORAL
  Filled 2018-01-19 (×2): qty 1

## 2018-01-19 MED ORDER — CHLORHEXIDINE GLUCONATE 0.12% ORAL RINSE (MEDLINE KIT)
15.0000 mL | Freq: Two times a day (BID) | OROMUCOSAL | Status: DC
  Administered 2018-01-19 – 2018-01-22 (×7): 15 mL via OROMUCOSAL

## 2018-01-19 MED ORDER — MIDAZOLAM HCL 2 MG/2ML IJ SOLN
4.0000 mg | Freq: Once | INTRAMUSCULAR | Status: AC
  Administered 2018-01-19: 4 mg via INTRAVENOUS

## 2018-01-19 MED ORDER — ORAL CARE MOUTH RINSE
15.0000 mL | OROMUCOSAL | Status: DC
  Administered 2018-01-19 – 2018-01-22 (×28): 15 mL via OROMUCOSAL

## 2018-01-19 MED ORDER — LEVOTHYROXINE SODIUM 50 MCG PO TABS
50.0000 ug | ORAL_TABLET | Freq: Every day | ORAL | Status: DC
  Administered 2018-01-20 – 2018-01-24 (×4): 50 ug
  Filled 2018-01-19 (×5): qty 1

## 2018-01-19 MED ORDER — FENTANYL 2500MCG IN NS 250ML (10MCG/ML) PREMIX INFUSION
25.0000 ug/h | INTRAVENOUS | Status: DC
  Administered 2018-01-20: 200 ug/h via INTRAVENOUS
  Administered 2018-01-21: 175 ug/h via INTRAVENOUS
  Filled 2018-01-19 (×3): qty 250

## 2018-01-19 MED ORDER — SODIUM CHLORIDE 0.9 % IV BOLUS
500.0000 mL | Freq: Once | INTRAVENOUS | Status: AC
  Administered 2018-01-19: 500 mL via INTRAVENOUS

## 2018-01-19 MED ORDER — SODIUM CHLORIDE 0.9% FLUSH
10.0000 mL | Freq: Two times a day (BID) | INTRAVENOUS | Status: DC
  Administered 2018-01-20 – 2018-01-24 (×7): 10 mL

## 2018-01-19 MED ORDER — ALBUTEROL SULFATE (2.5 MG/3ML) 0.083% IN NEBU
2.5000 mg | INHALATION_SOLUTION | RESPIRATORY_TRACT | Status: DC
  Filled 2018-01-19: qty 3

## 2018-01-19 MED ORDER — DOCUSATE SODIUM 50 MG/5ML PO LIQD: 100.0000 mg | Freq: Two times a day (BID) | ORAL | Status: DC | PRN

## 2018-01-19 MED ORDER — PROPOFOL 1000 MG/100ML IV EMUL
0.0000 ug/kg/min | INTRAVENOUS | Status: DC
  Administered 2018-01-19: 75 ug/kg/min via INTRAVENOUS
  Administered 2018-01-19: 40 ug/kg/min via INTRAVENOUS
  Filled 2018-01-19 (×2): qty 100

## 2018-01-19 MED ORDER — PROPOFOL 10 MG/ML IV BOLUS
INTRAVENOUS | Status: DC | PRN
  Administered 2018-01-19: 140 mg via INTRAVENOUS

## 2018-01-19 MED ORDER — MIDAZOLAM HCL 2 MG/2ML IJ SOLN
2.0000 mg | INTRAMUSCULAR | Status: DC | PRN
  Administered 2018-01-19: 2 mg via INTRAVENOUS
  Filled 2018-01-19 (×3): qty 2

## 2018-01-19 MED ORDER — DEXTROSE-NACL 5-0.9 % IV SOLN
INTRAVENOUS | Status: DC
  Administered 2018-01-19 – 2018-01-21 (×3): via INTRAVENOUS

## 2018-01-19 MED ORDER — SODIUM CHLORIDE 0.9 % IV SOLN
25.0000 ug/h | INTRAVENOUS | Status: DC
  Administered 2018-01-19: 50 ug/h via INTRAVENOUS
  Filled 2018-01-19: qty 50

## 2018-01-19 MED ORDER — FENTANYL BOLUS VIA INFUSION
25.0000 ug | INTRAVENOUS | Status: DC | PRN
  Filled 2018-01-19: qty 25

## 2018-01-19 MED ORDER — SODIUM CHLORIDE 0.9% FLUSH
10.0000 mL | INTRAVENOUS | Status: DC | PRN
  Administered 2018-01-23: 20 mL
  Filled 2018-01-19: qty 40

## 2018-01-19 MED ORDER — VALPROATE SODIUM 500 MG/5ML IV SOLN
2000.0000 mg | Freq: Once | INTRAVENOUS | Status: AC
  Administered 2018-01-19: 2000 mg via INTRAVENOUS
  Filled 2018-01-19: qty 20

## 2018-01-19 MED ORDER — DEXTROSE 50 % IV SOLN
INTRAVENOUS | Status: AC
  Administered 2018-01-19: 25 g via INTRAVENOUS
  Filled 2018-01-19: qty 50

## 2018-01-19 MED ORDER — MIDAZOLAM BOLUS VIA INFUSION
1.0000 mg | INTRAVENOUS | Status: DC | PRN
  Filled 2018-01-19: qty 2

## 2018-01-19 MED ORDER — FAMOTIDINE IN NACL 20-0.9 MG/50ML-% IV SOLN
20.0000 mg | Freq: Two times a day (BID) | INTRAVENOUS | Status: DC
  Administered 2018-01-19 – 2018-01-24 (×10): 20 mg via INTRAVENOUS
  Filled 2018-01-19 (×10): qty 50

## 2018-01-19 MED ORDER — BISACODYL 10 MG RE SUPP: 10.0000 mg | Freq: Every day | RECTAL | Status: DC | PRN

## 2018-01-19 MED ORDER — FENTANYL CITRATE (PF) 100 MCG/2ML IJ SOLN
50.0000 ug | Freq: Once | INTRAMUSCULAR | Status: AC
  Administered 2018-01-19: 50 ug via INTRAVENOUS
  Filled 2018-01-19: qty 2

## 2018-01-19 MED ORDER — PHENYLEPHRINE HCL 10 MG/ML IJ SOLN
0.0000 ug/min | INTRAMUSCULAR | Status: DC
  Administered 2018-01-20: 30 ug/min via INTRAVENOUS
  Administered 2018-01-20: 50 ug/min via INTRAVENOUS
  Administered 2018-01-20: 30 ug/min via INTRAVENOUS
  Administered 2018-01-20: 25 ug/min via INTRAVENOUS
  Administered 2018-01-21: 20 ug/min via INTRAVENOUS
  Administered 2018-01-21: 40 ug/min via INTRAVENOUS
  Administered 2018-01-21: 10 ug/min via INTRAVENOUS
  Administered 2018-01-21: 40 ug/min via INTRAVENOUS
  Administered 2018-01-22: 30 ug/min via INTRAVENOUS
  Filled 2018-01-19 (×4): qty 10
  Filled 2018-01-19: qty 1
  Filled 2018-01-19: qty 10
  Filled 2018-01-19 (×4): qty 1

## 2018-01-19 MED ORDER — IPRATROPIUM-ALBUTEROL 0.5-2.5 (3) MG/3ML IN SOLN: 3.0000 mL | RESPIRATORY_TRACT | Status: DC

## 2018-01-19 MED ORDER — DEXTROSE 50 % IV SOLN
12.5000 g | Freq: Once | INTRAVENOUS | Status: AC
  Administered 2018-01-19: 12.5 g via INTRAVENOUS
  Filled 2018-01-19: qty 50

## 2018-01-19 MED ORDER — VALPROATE SODIUM 500 MG/5ML IV SOLN
500.0000 mg | Freq: Three times a day (TID) | INTRAVENOUS | Status: DC
  Administered 2018-01-19 – 2018-01-20 (×2): 500 mg via INTRAVENOUS
  Filled 2018-01-19 (×3): qty 5

## 2018-01-19 MED ORDER — SODIUM CHLORIDE 0.9 % IV SOLN
0.0000 mg/h | INTRAVENOUS | Status: DC
  Administered 2018-01-19: 10 mg/h via INTRAVENOUS
  Administered 2018-01-19: 2 mg/h via INTRAVENOUS
  Administered 2018-01-20 (×2): 10 mg/h via INTRAVENOUS
  Filled 2018-01-19 (×4): qty 10

## 2018-01-19 MED ORDER — IPRATROPIUM BROMIDE 0.02 % IN SOLN
0.5000 mg | RESPIRATORY_TRACT | Status: DC
  Filled 2018-01-19: qty 2.5

## 2018-01-19 MED ORDER — DEXTROSE 50 % IV SOLN
25.0000 g | Freq: Once | INTRAVENOUS | Status: AC
  Administered 2018-01-19: 25 g via INTRAVENOUS

## 2018-01-19 MED ORDER — PHENYLEPHRINE HCL-NACL 10-0.9 MG/250ML-% IV SOLN
0.0000 ug/min | INTRAVENOUS | Status: DC
  Filled 2018-01-19: qty 250

## 2018-01-19 MED FILL — Medication: Qty: 1 | Status: AC

## 2018-01-19 NOTE — Progress Notes (Signed)
EEG completed, results pending Pt will be on LTM due to EEG findings

## 2018-01-19 NOTE — Progress Notes (Addendum)
PROGRESS NOTE    Tasha Clarke  GEX:[id number] DOB: 04/03/1951 DOA: 01/17/2018 PCP: Dettinger, Fransisca Kaufmann, MD    Brief Narrative:  67 year old female with seizure disorder, pain to the hospital with acute gastroenteritis and hypokalemia.  Since she was unable to take her seizure medications for several days, she is developed significant status epilepticus in the hospital.  Currently, due to her persistent seizures and potential aspiration, she was intubated on 7/11 for airway protection..  She remains critically ill on the ventilator.  Neurology and pulmonology following.  Assessment & Plan:   Active Problems:   COPD (chronic obstructive pulmonary disease) (HCC)   Chronic pain syndrome   Hypothyroid   Hypertension   Hypokalemia   Seizure (HCC)   Gastroenteritis   1. Status epilepticus.  Patient has known history of seizure disorder.  Prior to admission, she was unable to take her Keppra due to persistent nausea and vomiting.  Since arrival to the hospital, she had several seizures.  She was seen by neurology and started on Vimpat in addition to intravenous Keppra.  Unfortunately, she is continued to have seizures.  During her last episode, there was concern that she may have aspirated since she became hypoxic and required significant suctioning.  Decision was made to intubate the patient for airway protection.  Will consult pulmonology.  Start on propofol for sedation which should also suppress seizures.  Defer further adjustment of antiepileptics neurology 2. Acute gastroenteritis.  GI pathogen panel was ordered.  Continue to treat supportively.  Abnormality noted on CT within the stomach, will need to be further evaluated with endoscopy once she is medically stable  3. Hypokalemia.  Related to GI losses.  Replaced.  Magnesium has also been corrected. 4. COPD.  Wheezing has improved.  Continue on bronchodilators. 5. Chronic pain syndrome.  Continues to be somnolent.  Will hold off on  further pain medications for now. 6. Hypothyroidism.  Resume levothyroxine once her mental status is improved and she is able to take p.o. medications.   DVT prophylaxis: Lovenox Code Status: Full code Family Communication: Discussed with husband Disposition Plan: Discharge home once improved   Consultants:   Neurology  Pulmonology  Procedures:  EEG: 1.  Single episode of right temporal slowing is observed.  This can be associated with epileptic focus.  There is no evidence of continues electrographic seizures.  2.  Excessive spindles are observed.  Undoubtedly, this is due to benzodiazepine.  Antimicrobials:       Subjective: Reported by staff that patient continued to have seizures overnight and received Ativan.  She has had 2 seizures this morning.  Much bowel movement this morning.  After her last seizure, she has become hypoxic and short of breath.  There was concern that she may have aspirated.  She was suctioned with removal of large amounts of mucus  Objective: Vitals:   01/19/18 0757 01/19/18 0800 01/19/18 0900 01/19/18 1000  BP:  (!) 146/70 (!) 97/57 (!) 102/44  Pulse:  91 87 88  Resp:  14 20 (!) 22  Temp:      TempSrc:      SpO2: 96% 99% 95% (!) 88%  Weight:      Height:        Intake/Output Summary (Last 24 hours) at 01/19/2018 1115 Last data filed at 01/19/2018 1057 Gross per 24 hour  Intake 4728.33 ml  Output 950 ml  Net 3778.33 ml   Filed Weights   01/17/18 0659 01/17/18 1709 01/18/18 0500  Weight: 99.8 kg (220 lb) 87.2 kg (192 lb 3.9 oz) 87.2 kg (192 lb 3.9 oz)    Examination:  General exam: lethargic, laying in bed, increased respiratory effort Respiratory system: bilateral rhonchi, no wheezing Cardiovascular system:RRR. No murmurs, rubs, gallops. Gastrointestinal system: Abdomen is nondistended, soft and nontender. No organomegaly or masses felt. Normal bowel sounds heard. Central nervous system: No focal neurological deficits. Extremities:  no edema bilaterally Skin: No rashes, lesions or ulcers Psychiatry: lethargic     Data Reviewed: I have personally reviewed following labs and imaging studies  CBC: Recent Labs  Lab 01/17/18 0736 01/18/18 0515 01/19/18 0403  WBC 10.2 11.5* 8.2  NEUTROABS 8.0*  --   --   HGB 14.3 12.8 12.1  HCT 43.2 41.0 39.3  MCV 88.9 89.1 91.8  PLT 293 284 614   Basic Metabolic Panel: Recent Labs  Lab 01/17/18 0736 01/18/18 0515 01/19/18 0403  NA 138 139 140  K 2.7* 2.7* 4.2  CL 102 103 108  CO2 23 26 26   GLUCOSE 164* 100* 91  BUN 10 5* 6*  CREATININE 0.68 0.49 0.47  CALCIUM 9.1 7.9* 7.4*  MG  --  0.9* 1.7   GFR: Estimated Creatinine Clearance: 75.5 mL/min (by C-G formula based on SCr of 0.47 mg/dL). Liver Function Tests: Recent Labs  Lab 01/17/18 0736  AST 23  ALT 15  ALKPHOS 92  BILITOT 0.6  PROT 7.0  ALBUMIN 3.4*   Recent Labs  Lab 01/17/18 0736  LIPASE 27   No results for input(s): AMMONIA in the last 168 hours. Coagulation Profile: No results for input(s): INR, PROTIME in the last 168 hours. Cardiac Enzymes: No results for input(s): CKTOTAL, CKMB, CKMBINDEX, TROPONINI in the last 168 hours. BNP (last 3 results) No results for input(s): PROBNP in the last 8760 hours. HbA1C: No results for input(s): HGBA1C in the last 72 hours. CBG: Recent Labs  Lab 01/17/18 1617  GLUCAP 115*   Lipid Profile: No results for input(s): CHOL, HDL, LDLCALC, TRIG, CHOLHDL, LDLDIRECT in the last 72 hours. Thyroid Function Tests: No results for input(s): TSH, T4TOTAL, FREET4, T3FREE, THYROIDAB in the last 72 hours. Anemia Panel: No results for input(s): VITAMINB12, FOLATE, FERRITIN, TIBC, IRON, RETICCTPCT in the last 72 hours. Sepsis Labs: No results for input(s): PROCALCITON, LATICACIDVEN in the last 168 hours.  Recent Results (from the past 240 hour(s))  MRSA PCR Screening     Status: None   Collection Time: 01/17/18  4:49 PM  Result Value Ref Range Status   MRSA by  PCR NEGATIVE NEGATIVE Final    Comment:        The GeneXpert MRSA Assay (FDA approved for NASAL specimens only), is one component of a comprehensive MRSA colonization surveillance program. It is not intended to diagnose MRSA infection nor to guide or monitor treatment for MRSA infections. Performed at [facility], [address] [place], [place] [address]          Radiology Studies: Ct Head Wo Contrast  Result Date: 01/18/2018 CLINICAL DATA:  67 year old female with vomiting, diarrhea. Recent seizure activity, history of seizures. EXAM: CT HEAD WITHOUT CONTRAST TECHNIQUE: Contiguous axial images were obtained from the base of the skull through the vertex without intravenous contrast. COMPARISON:  None. FINDINGS: Brain: Mild motion artifact. Cerebral volume is within normal limits for age. No midline shift, ventriculomegaly, mass effect, evidence of mass lesion, intracranial hemorrhage or evidence of cortically based acute infarction. Gray-white matter differentiation is within normal limits throughout the brain.  Vascular: Calcified atherosclerosis at the skull base. No suspicious intracranial vascular hyperdensity. Skull: Osteopenia.  No acute osseous abnormality identified. Sinuses/Orbits: Low-density bubbly opacity in the visible left maxillary sinus. Other paranasal sinuses are clear (incidental osteomas in the right frontal and ethmoid sinuses). Tympanic cavities and mastoids are clear. Other: Visualized scalp soft tissues are within normal limits. Negative visible orbits soft tissues. IMPRESSION: 1.  Normal for age non contrast CT appearance of the brain. 2. Partially visible bubbly opacity in the left maxillary sinus, consider sinusitis. Electronically Signed   By: Genevie Ann M.D.   On: 01/18/2018 12:25        Scheduled Meds: . albuterol  2.5 mg Nebulization Q4H  . enoxaparin (LOVENOX) injection  40 mg Subcutaneous Q24H  . fluticasone furoate-vilanterol  1 puff Inhalation Daily    . ipratropium  0.5 mg Nebulization Q4H   Continuous Infusions: . 0.9 % NaCl with KCl 40 mEq / L 125 mL/hr (01/19/18 0502)  . famotidine (PEPCID) IV    . lacosamide (VIMPAT) IV Stopped (01/19/18 1053)  . levETIRAcetam Stopped (01/19/18 1115)  . propofol (DIPRIVAN) infusion       LOS: 1 day    Critical care: Time spent: 30 mins    Kathie Dike, MD [facility] Pager [phone]  If 7PM-7AM, please contact night-coverage [email or web address] Password [facility] 01/19/2018, 11:15 AM    Addendum 13:00:  Case discussed with Dr. Merlene Laughter, and since patient is continues to have seizures and is now intubated, she will need continuous EEG monitoring.  Since we cannot perform this at [facility], he is recommended transfer to [facility].  I have discussed her case with Dr. Elwyn Reach with critical care at [facility] who accepted patient in transfer.  I informed patient's husband of plan who is in agreement.  [employer]

## 2018-01-19 NOTE — Procedures (Signed)
History: 67 year old female intubated for status epilepticus.  Sedation: Propofol  Technique: This is a 21 channel routine scalp EEG performed at the bedside with bipolar and monopolar montages arranged in accordance to the international 10/20 system of electrode placement. One channel was dedicated to EKG recording.    Background: The background consists of high voltage irregular delta activity with bifrontally predominant generalized beta activity as well.  With increasing sedation, the delta activity attenuates and there is some sleep architecture seen.  Photic stimulation: Physiologic driving is not performed  EEG Abnormalities: 1) excessive beta activity 2) generalized irregular slow activity  Clinical Interpretation: This EEG is consistent with generalized dysfunction as can be seen with sedative medications.  There was no definite seizure predisposition or seizure seen on the study. A normal EEG does not preclude the possibility of epilepsy.  Roland Rack, MD [facility] [phone]  If 7pm- 7am, please page neurology on call as listed in [place].

## 2018-01-19 NOTE — Progress Notes (Signed)
vLTM EEG running/ no skin breakdown. Notified neuro

## 2018-01-19 NOTE — Progress Notes (Signed)
[place] A. Merlene Laughter, MD     [email or web address]          Tasha Clarke is an 67 y.o. female.   Assessment/Plan:  1.  Convulsive STATUS EPILEPTICUS:   This is mostly resolved.  Again, this is due to underlying epilepsy and abrupt cessation of seizure medicines.  Continue with her current antiepileptic medications Keppra and Vimpat.  2.  Mild-to-moderate encephalopathy due to multiple seizures and medication effect:   3.  Persistent nausea vomiting due to presumed gastroenteritis:     One clinical seizure this morning.  She is status post Ativan which explains her even worst drowsiness.   GENERAL:  The patient is laying in bed and in no acute distress although she appears to be in some discomfort.  She is more drowsier today.  HEENT:   Neck is supple and no evidence of trauma noted   EXTREMITIES: No edema ; there is marked arthritic changes of the knees bilaterally.  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  There is marked pain with manipulation of the legs bilaterally especially on the right.   BACK: normal  SKIN: Normal by inspection.    MENTAL STATUS:  She lays in bed with eyes closed.  She opens her eyes to verbal commands.  She [facility] that [facility] but is not oriented to city.  She does follow commands well and speaking full 3-4 word sentences that is coherent.  She does require repeat stimulation to keep her eyes open.  CRANIAL NERVES: Pupils are equal, round and reactive to light; extra ocular movements are full, there is no significant nystagmus; visual fields are full; upper and lower facial muscles are normal in strength and symmetric, there is no flattening of the nasolabial folds; tongue is midline.  MOTOR:   She moves upper extremities well.  She has antigravity strength in the legs.  There appears to be difficulties moving the toes which she tells me she has had for a long time after work related  injury.  COORDINATION: Left finger to nose is normal, right finger to nose is normal, No rest tremor; no intention tremor; no postural tremor; no bradykinesia.  SENSATION: Normal to pain.        Objective: Vital signs in last 24 hours: Temp:  [98.3 F (36.8 C)-99.3 F (37.4 C)] 98.6 F (37 C) (07/11 0741) Pulse Rate:  [70-87] 79 (07/11 0741) Resp:  [7-28] 7 (07/11 0741) BP: (98-175)/(54-87) 140/70 (07/11 0600) SpO2:  [92 %-99 %] 96 % (07/11 0757)  Intake/Output from previous day: 07/10 0701 - 07/11 0700 In: 4633.3 [I.V.:3429.2; IV Piggyback:1204.2] Out: 350 [Urine:350] Intake/Output this shift: No intake/output data recorded. Nutritional status:  Diet Order    None       Lab Results: Results for orders placed or performed during the hospital encounter of 01/17/18 (from the past 48 hour(s))  Urinalysis, Routine w reflex microscopic     Status: Abnormal   Collection Time: 01/17/18 11:44 AM  Result Value Ref Range   Color, Urine YELLOW YELLOW   APPearance CLEAR CLEAR   Specific Gravity, Urine 1.014 1.005 - 1.030   pH 6.0 5.0 - 8.0   Glucose, UA NEGATIVE NEGATIVE mg/dL   Hgb urine dipstick NEGATIVE NEGATIVE   Bilirubin Urine NEGATIVE NEGATIVE   Ketones, ur NEGATIVE NEGATIVE mg/dL   Protein, ur 30 (A) NEGATIVE mg/dL   Nitrite NEGATIVE NEGATIVE   Leukocytes, UA NEGATIVE NEGATIVE   RBC / HPF  0-5 0 - 5 RBC/hpf   WBC, UA 0-5 0 - 5 WBC/hpf   Bacteria, UA RARE (A) NONE SEEN   Mucus PRESENT    Hyaline Casts, UA PRESENT     Comment: Performed at [facility], [address], [place], [place] [address]  Glucose, capillary     Status: Abnormal   Collection Time: 01/17/18  4:17 PM  Result Value Ref Range   Glucose-Capillary 115 (H) 70 - 99 mg/dL   Comment 1 Notify RN    Comment 2 Document in Chart   MRSA PCR Screening     Status: None   Collection Time: 01/17/18  4:49 PM  Result Value Ref Range   MRSA by PCR NEGATIVE NEGATIVE    Comment:        The GeneXpert  MRSA Assay (FDA approved for NASAL specimens only), is one component of a comprehensive MRSA colonization surveillance program. It is not intended to diagnose MRSA infection nor to guide or monitor treatment for MRSA infections. Performed at [facility], [address], [place], [place] [address]   Basic metabolic panel     Status: Abnormal   Collection Time: 01/18/18  5:15 AM  Result Value Ref Range   Sodium 139 135 - 145 mmol/L   Potassium 2.7 (LL) 3.5 - 5.1 mmol/L    Comment: CRITICAL RESULT CALLED TO, READ BACK BY AND VERIFIED WITH: DANIEL,J AT 6:35AM ON 01/18/18 BY FESTERMAN,C    Chloride 103 98 - 111 mmol/L    Comment: Please note change in reference range.   CO2 26 22 - 32 mmol/L   Glucose, Bld 100 (H) 70 - 99 mg/dL    Comment: Please note change in reference range.   BUN 5 (L) 8 - 23 mg/dL    Comment: Please note change in reference range.   Creatinine, Ser 0.49 0.44 - 1.00 mg/dL   Calcium 7.9 (L) 8.9 - 10.3 mg/dL   GFR calc non Af Amer >60 >60 mL/min   GFR calc Af Amer >60 >60 mL/min    Comment: (NOTE) The eGFR has been calculated using the CKD EPI equation. This calculation has not been validated in all clinical situations. eGFR's persistently <60 mL/min signify possible Chronic Kidney Disease.    Anion gap 10 5 - 15    Comment: Performed at [facility], [address], [place], [place] [address]  CBC     Status: Abnormal   Collection Time: 01/18/18  5:15 AM  Result Value Ref Range   WBC 11.5 (H) 4.0 - 10.5 K/uL   RBC 4.60 3.87 - 5.11 MIL/uL   Hemoglobin 12.8 12.0 - 15.0 g/dL   HCT 41.0 36.0 - 46.0 %   MCV 89.1 78.0 - 100.0 fL   MCH 27.8 26.0 - 34.0 pg   MCHC 31.2 30.0 - 36.0 g/dL   RDW 18.6 (H) 11.5 - 15.5 %   Platelets 284 150 - 400 K/uL    Comment: Performed at [facility], [address], [place], [place] [address]  Magnesium     Status: Abnormal   Collection Time: 01/18/18  5:15 AM  Result Value Ref Range   Magnesium 0.9 (LL) 1.7 - 2.4 mg/dL     Comment: CRITICAL RESULT CALLED TO, READ BACK BY AND VERIFIED WITH: DANIEL,J AT 6:35AM ON 01/18/18 BY [facility] Performed at [facility], [address] [place], [place] [address]   Basic metabolic panel     Status: Abnormal   Collection Time: 01/19/18  4:03 AM  Result Value Ref Range   Sodium 140 135 - 145 mmol/L   Potassium 4.2 3.5 - 5.1 mmol/L    Comment: DELTA CHECK NOTED   Chloride 108 98 - 111 mmol/L    Comment: Please note change in reference range.   CO2 26 22 - 32 mmol/L   Glucose, Bld 91 70 - 99 mg/dL    Comment: Please note change in reference range.   BUN 6 (L) 8 - 23 mg/dL    Comment: Please note change in reference range.   Creatinine, Ser 0.47 0.44 - 1.00 mg/dL   Calcium 7.4 (L) 8.9 - 10.3 mg/dL   GFR calc non Af Amer >60 >60 mL/min   GFR calc Af Amer >60 >60 mL/min    Comment: (NOTE) The eGFR has been calculated using the CKD EPI equation. This calculation has not been validated in all clinical situations. eGFR's persistently <60 mL/min signify possible Chronic Kidney Disease.    Anion gap 6 5 - 15    Comment: Performed at [facility], [address], [place], [place] [address]  CBC     Status: Abnormal   Collection Time: 01/19/18  4:03 AM  Result Value Ref Range   WBC 8.2 4.0 - 10.5 K/uL   RBC 4.28 3.87 - 5.11 MIL/uL   Hemoglobin 12.1 12.0 - 15.0 g/dL   HCT 39.3 36.0 - 46.0 %   MCV 91.8 78.0 - 100.0 fL   MCH 28.3 26.0 - 34.0 pg   MCHC 30.8 30.0 - 36.0 g/dL   RDW 18.9 (H) 11.5 - 15.5 %   Platelets 245 150 - 400 K/uL    Comment: Performed at [facility], [address], [place], [place] [address]  Magnesium     Status: None   Collection Time: 01/19/18  4:03 AM  Result Value Ref Range   Magnesium 1.7 1.7 - 2.4 mg/dL    Comment: Performed at [facility], [address], [place], [place] [address]    Lipid Panel No results for input(s): CHOL, TRIG, HDL, CHOLHDL, VLDL, LDLCALC in the last 72 hours.  Studies/Results:  HEAD CT FINDINGS: Brain: Mild  motion artifact. Cerebral volume is within normal limits for age. No midline shift, ventriculomegaly, mass effect, evidence of mass lesion, intracranial hemorrhage or evidence of cortically based acute infarction. Gray-white matter differentiation is within normal limits throughout the brain.  Vascular: Calcified atherosclerosis at the skull base. No suspicious intracranial vascular hyperdensity.  Skull: Osteopenia.  No acute osseous abnormality identified.  Sinuses/Orbits: Low-density bubbly opacity in the visible left maxillary sinus. Other paranasal sinuses are clear (incidental osteomas in the right frontal and ethmoid sinuses). Tympanic cavities and mastoids are clear.  Other: Visualized scalp soft tissues are within normal limits. Negative visible orbits soft tissues.  IMPRESSION: 1.  Normal for age non contrast CT appearance of the brain. 2. Partially visible bubbly opacity in the left maxillary sinus, consider sinusitis.     Medications:  Scheduled Meds: . enoxaparin (LOVENOX) injection  40 mg Subcutaneous Q24H  . fluticasone furoate-vilanterol  1 puff Inhalation Daily  . ipratropium-albuterol  3 mL Nebulization TID   Continuous Infusions: . 0.9 % NaCl with KCl 40 mEq / L 125 mL/hr (01/19/18 0502)  . famotidine (PEPCID) IV Stopped (01/18/18 2137)  . lacosamide (VIMPAT) IV Stopped (01/18/18 2202)  . levETIRAcetam Stopped (01/18/18 2122)   PRN Meds:.acetaminophen **OR** acetaminophen, hydrALAZINE, ipratropium-albuterol, LORazepam, ondansetron **OR** ondansetron (ZOFRAN) IV     LOS: 1 day   [place] A.  Merlene Laughter, M.D.  Diplomate, Tax adviser of Psychiatry and Neurology ( Neurology).

## 2018-01-19 NOTE — H&P (Addendum)
PULMONARY / CRITICAL CARE MEDICINE   Name: Tasha Clarke MRN: [id number] DOB: 01/27/1951    ADMISSION DATE:  01/17/2018 CONSULTATION DATE:  01/19/2017  REFERRING MD:  Dr. Roderic Palau  CHIEF COMPLAINT:  Nausea, Vomiting, and Status Epilepticus   HISTORY OF PRESENT ILLNESS:   HPI obtained from medical chart review and per husband at bedside, as patient is currently intubated and sedated on mechanical ventilation.   67 year old female with past medical history significant for COPD on home O2 2L baseline, ongoing tobacco abuse, seizure disorder, chronic pain syndrome, GERD, DM, HTN, and CKD admitted to [facility] after 2-day history of nausea, vomiting, and diarrhea.    Last seizure reportedly Oct 2017 per husband. She was unable to take her medications, including keppra, for several days 2-3.  She was treated supportively for nausea and vomiting.  GI panel pending.  Subsequently, she had multiple seizures treated with Ativan.  She was started on IV Keppra.  Neurology was consulted on 7/10, with EEG showing single episode of right temporal slowing.  IV Vimpat was added.  CT head negative. Patient continued to have multiple seizures therefore she was electively intubated today for airway protection for concern of status epilepticus.  She has been afebrile and hemodynamically stable.  PCCM accepted transfer as patient will require continuous EEG monitoring, neurology to consult.  PAST MEDICAL HISTORY :  She  has a past medical history of Allergy, Anemia, Anxiety, Arthritis, Asthma, Blood transfusion without reported diagnosis, Cataract, CHF (congestive heart failure) ([place]), Chronic kidney disease, Clotting disorder ([place]), COPD (chronic obstructive pulmonary disease) ([place]), Depression, Diabetes mellitus without complication ([place]), Emphysema of lung ([place]), GERD (gastroesophageal reflux disease), Hyperlipidemia, Hypertension, Neuromuscular disorder ([place]), Osteoporosis, Oxygen deficiency, Seizures  ([place]), Skin cancer, Sleep apnea (1985), and Thyroid disease.  PAST SURGICAL HISTORY: She  has a past surgical history that includes Appendectomy; Eye surgery; Fracture surgery; Joint replacement; Spine surgery; heart stents; and Spinal cord stimulator implant (2015).  Allergies  Allergen Reactions  . Cephalexin Hives  . Ketorolac Tromethamine Hives  . Lac Bovis Nausea And Vomiting  . Imdur  [Isosorbide Dinitrate]   . Iodinated Diagnostic Agents   . Other Other (See Comments)    Alka seltzer causes blurred vision, fainting  . Sodium Bicarbonate-Citric Acid   . Milk-Related Compounds Nausea And Vomiting    No current facility-administered medications on file prior to encounter.    Current Outpatient Medications on File Prior to Encounter  Medication Sig  . albuterol (PROVENTIL HFA) 108 (90 Base) MCG/ACT inhaler Inhale into the lungs.  . diphenhydramine-acetaminophen (TYLENOL PM EXTRA STRENGTH) 25-500 MG TABS tablet Take 1 tablet by mouth at bedtime as needed (pain).   Marland Kitchen EPINEPHrine 0.3 mg/0.3 mL IJ SOAJ injection Inject 0.3 mg into the muscle as needed.  Marland Kitchen ipratropium-albuterol (DUONEB) 0.5-2.5 (3) MG/3ML SOLN Take 3 mLs by nebulization every 6 (six) hours as needed (shortness of breathe).   . Morphine-Naltrexone (EMBEDA) 30-1.2 MG CPCR Take 1 capsule by mouth 2 (two) times daily.  . naloxone (NARCAN) nasal spray 4 mg/0.1 mL Place into the nose.  Marland Kitchen alendronate (FOSAMAX) 70 MG tablet Take 1 tablet (70 mg total) by mouth every Monday. Take with a full glass of water on an empty stomach.  Marland Kitchen aspirin EC 81 MG tablet Take 81 mg by mouth daily.  Marland Kitchen atorvastatin (LIPITOR) 40 MG tablet Take 40 mg by mouth daily.  . cetirizine (ZYRTEC) 10 MG tablet Take 1 tablet (10 mg total) by mouth daily.  Marland Kitchen  Cholecalciferol (VITAMIN D-3) 5000 units TABS Take 5,000 Units by mouth daily.  . cyclobenzaprine (FLEXERIL) 10 MG tablet Take 10 mg by mouth 2 (two) times daily as needed for muscle spasms.  . DULoxetine  (CYMBALTA) 30 MG capsule Take 30 mg by mouth daily.  . Ferrous Sulfate (IRON) 325 (65 Fe) MG TABS Take 1 tablet (325 mg total) by mouth daily.  . fluticasone (FLONASE) 50 MCG/ACT nasal spray Place 2 sprays into both nostrils daily.  . fluticasone furoate-vilanterol (BREO ELLIPTA) 200-25 MCG/INH AEPB Inhale 1 puff into the lungs daily.  . Fluticasone-Salmeterol (ADVAIR) 250-50 MCG/DOSE AEPB Inhale 1 puff into the lungs 2 (two) times daily.  Marland Kitchen gabapentin (NEURONTIN) 600 MG tablet Take 1 tablet (600 mg total) by mouth 4 (four) times daily.  Marland Kitchen levETIRAcetam (KEPPRA) 1000 MG tablet TAKE 1 TABLET BY MOUTH AT BEDTIME  . levothyroxine (SYNTHROID, LEVOTHROID) 50 MCG tablet Take 50 mcg by mouth daily before breakfast.  . losartan (COZAAR) 100 MG tablet TAKE 1 2 (ONE HALF) TABLET BY MOUTH ONCE DAILY  . Magnesium 400 MG CAPS Take 400 mg by mouth daily.  . metoprolol tartrate (LOPRESSOR) 25 MG tablet Take 25 mg by mouth 2 (two) times daily.  . Multiple Vitamin (MULTIVITAMIN) tablet Take 1 tablet by mouth daily.  . Omega-3 Fatty Acids (FISH OIL) 1000 MG CAPS Take 1,000 mg by mouth daily.    FAMILY HISTORY:  Her family history includes Allergies in her daughter; Anemia in her sister; Arthritis in her father, maternal grandfather, maternal grandmother, mother, paternal grandfather, and paternal grandmother; Asthma in her father, maternal grandfather, maternal grandmother, mother, paternal grandfather, paternal grandmother, and sister; COPD in her paternal grandfather; Cancer in her mother; Depression in her father, maternal grandmother, mother, and sister; Diabetes in her mother, sister, and sister; Hypertension in her son; Mental illness in her sister; Post-traumatic stress disorder in her son.  SOCIAL HISTORY: She  reports that she has been smoking cigarettes.  She has a 32.25 pack-year smoking history. She has never used smokeless tobacco. She reports that she does not drink alcohol or use drugs.  Husband  confirmed rare ETOH use   REVIEW OF SYSTEMS:   Unable to assess as patient is intubated and sedated on mechanical ventilation.  SUBJECTIVE:  Arrived on Propofol 75 mcg/kg/min and fentanyl gtt 150 mcg/min  VITAL SIGNS: BP (!) 158/74   Pulse 82   Temp 98.6 F (37 C) (Axillary)   Resp 15   Ht 5\' 3"  (1.6 m)   Wt 192 lb 3.9 oz (87.2 kg)   SpO2 100%   BMI 34.05 kg/m   HEMODYNAMICS:    VENTILATOR SETTINGS: Vent Mode: PRVC FiO2 (%):  [100 %] 100 % Set Rate:  [16 bmp] 16 bmp Vt Set:  [420 mL] 420 mL PEEP:  [5 cmH20] 5 cmH20 Plateau Pressure:  [22 cmH20] 22 cmH20  INTAKE / OUTPUT: I/O last 3 completed shifts: In: 4633.3 [I.V.:3429.2; IV Piggyback:1204.2] Out: 350 [Urine:350]  PHYSICAL EXAMINATION: General:  Critically ill female, older than age, sedated on MV HEENT: MM pink/moist, pupils 3/reactive, ETT 7.5 cm, 22 at lip, OGT Neuro: sedated, withdrawals in BLE, flinches in upper extremities, not f/c or open eyes  CV: rrr, no m/r/g PULM: even/non-labored on MV, breathing at set rate, lungs bilaterally diminished, no wheeze GI: obese, soft, hyper active BS  Extremities: warm/dry, trace BLE edema  Skin: no rashes   LABS:  BMET Recent Labs  Lab 01/17/18 0736 01/18/18 0515 01/19/18 0403  NA  138 139 140  K 2.7* 2.7* 4.2  CL 102 103 108  CO2 23 26 26   BUN 10 5* 6*  CREATININE 0.68 0.49 0.47  GLUCOSE 164* 100* 91    Electrolytes Recent Labs  Lab 01/17/18 0736 01/18/18 0515 01/19/18 0403  CALCIUM 9.1 7.9* 7.4*  MG  --  0.9* 1.7    CBC Recent Labs  Lab 01/17/18 0736 01/18/18 0515 01/19/18 0403  WBC 10.2 11.5* 8.2  HGB 14.3 12.8 12.1  HCT 43.2 41.0 39.3  PLT 293 284 245    Coag's No results for input(s): APTT, INR in the last 168 hours.  Sepsis Markers No results for input(s): LATICACIDVEN, PROCALCITON, O2SATVEN in the last 168 hours.  ABG Recent Labs  Lab 01/19/18 1410  PHART 7.395  PCO2ART 38.1  PO2ART 442*    Liver Enzymes Recent Labs   Lab 01/17/18 0736  AST 23  ALT 15  ALKPHOS 92  BILITOT 0.6  ALBUMIN 3.4*    Cardiac Enzymes No results for input(s): TROPONINI, PROBNP in the last 168 hours.  Glucose Recent Labs  Lab 01/17/18 1617  GLUCAP 115*    Imaging Portable Chest X-ray  Result Date: 01/19/2018 CLINICAL DATA:  Status post endotracheal tube placement EXAM: PORTABLE CHEST 1 VIEW COMPARISON:  CT chest 10/28/2017 FINDINGS: Right-sided PICC line with the tip projecting over the cavoatrial junction. Endotracheal tube with the tip 6 cm above the carina. Nasogastric tube coursing below the diaphragm. Diffuse bilateral interstitial thickening. No pleural effusion or pneumothorax. Stable cardiomegaly. No aggressive osseous lesion. IMPRESSION: 1. Right-sided PICC line with the tip projecting over the cavoatrial junction. 2. Endotracheal tube with the tip 6 cm above the carina. 3. Nasogastric tube coursing below the diaphragm. 4. Findings concerning for mild CHF. Electronically Signed   By: Kathreen Devoid   On: 01/19/2018 11:48   STUDIES:  7/9 CT abd/pelvis >> 1. Gastric wall thickening along the posterior aspect of the fundus and cardia with potential polypoid extension into the gastric lumen. Underlying gastric mass cannot be excluded. There also may be component of a small diverticulum versus area of ulceration along the posterior wall of the proximal stomach. Correlation with endoscopy may be helpful if there are any symptoms suggestive of gastric inflammation or pathology. 2. Possible enteritis involving the small bowel with scattered fluid throughout the small bowel. 3. 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  7/10 CTH >> 1.  Normal for age non contrast CT appearance of the brain. 2. Partially visible bubbly opacity in the left maxillary sinus, consider sinusitis.  7/10 EEG >> 1. Single episode of right temporal slowing is observed. This can be associated with epileptic  focus. There is no evidence of continues electrographic seizures. 2. Excessive spindles are observed. Undoubtedly, this is due to benzodiazepine.  CULTURES: 7/9 MRSA PCR >> neg 7/11 GI panel >>  ANTIBIOTICS: n/a  SIGNIFICANT EVENTS: 7/9 Admit to [facility] 7/11 Intubated/ tx to [facility]  LINES/TUBES: 7/9 DL R PICC >> 7/11 ETT >> 7/11 OGT >> 7/11 Foley >>  DISCUSSION: 49 yoF with seizure hx presenting with several days of N/V/D, unable to take meds with breakthrough seizures.  Intubated on 7/11 for airway protection in concern for SE and transfer to [facility] for LTM.  ASSESSMENT / PLAN:  PULMONARY A: Respiratory insufficiency in the setting of SE COPD Tobacco abuse  P:   Full MV support, PRVC 8 cc/kg, rate 16 CXR now and intermittently VAP bundle Duonebs q6, albuterol  prn, continue Breo pepcid BID for SUP  CARDIOVASCULAR A:  Currently normotensive Hx HTN, HLD - prior TTE 2/19 with normal EF/ diastolic dysfunction P:  Tele monitoring Goal MAP >65 Holding home lopressor, losartan, asa Continue home atorvastatin   RENAL A:   No acute issues P:   NS w/40 mEq at 125 ml/hr Continue foley  Trend BMP /mag/ phos/ daily wt/ urinary output Replace electrolytes as indicated Avoid nephrotoxic agents, ensure adequate renal perfusion  GASTROINTESTINAL A:   Acute gastroenteritis  Concern for gastric mass - prior chest CT 10/2017 concerning for esophagitis and small hiatial hernia - no BM 7/10, last BM 7/11 P:   GI panel pending NPO for now/ clamp OGT Consider trickle feeds 7/12 Check LFTs in am  Bowel regimen as needed for PAD protocol  May need GI eval at some point for possible gastric mass  HEMATOLOGIC A:   Chronic normocytic anemia- stable P:  Continue lovenox and SCDs Trend CBC  INFECTIOUS A:   Gastroenteritis  At risk for aspiration - currently afebrile, normal WBC P:   Continue enteric precautions GI panel pending- just sent today due to no  stools Monitor clinically Trend WBC/ fever curve   ENDOCRINE A:   DM Hypothyroidism P:   Resume synthroid per tube daily, 50 mcg CBG q 4 Add SSI if needed   NEUROLOGIC A:   Concern for Status Epilepticus  Chronic pain- on multiple home meds including morphine/ naltrexone P:   Neurology consulted and will see here RASS goal -2/-3 Continue fentanyl, d/c propofol with elevated trigylerides Start versed gtt AED's- vimpat and keppra per Neurology  FAMILY  - Updates: Husband, [name], updated at bedside.    - Inter-disciplinary family meet or Palliative Care meeting due by:  7/18  CCT 40 mins  Kennieth Rad, AGACNP-BC [facility] Pgr: [phone] or if no answer [phone] 01/19/2018, 5:24 PM

## 2018-01-19 NOTE — Consult Note (Addendum)
NEURO HOSPITALIST CONSULT NOTE   Requestig physician: Dr. Roderic Palau   Reason for Consult: Seizures   History obtained from: Chart  HPI:                                                                                                                                          Tasha Clarke is an 67 y.o. female history of seizures who presented to outside hospital secondary to having intractable nausea and vomiting and unable to keep anything down including her antiepileptic medications.  Patient started having seizures in the hospital.  Patient was given IV Keppra.  The dose that has been the same as she typically gets prior to hospitalization.  Description of the seizures at that time was that she had stiffening and then shaking with alteration of consciousness.  The episodes lasted between 20 and 60 seconds.  On 01/18/2018 patient had one seizure on night shift.  Patient did have 2 seizures this morning that were back to back.  EEG was obtained and showed single episode of right temporal slowing but no epileptiform activity.  Due to aspiration and airway section patient was intubated and placed on propofol.  She is currently on 1000 mg 3 times daily of Keppra and 200 mg twice daily of Vimpat.  Allergy was asked to help with controlling seizure activity  Past Medical History:  Diagnosis Date  . Allergy   . Anemia   . Anxiety   . Arthritis   . Asthma   . Blood transfusion without reported diagnosis   . Cataract   . CHF (congestive heart failure) ([place])   . Chronic kidney disease   . Clotting disorder ([place])   . COPD (chronic obstructive pulmonary disease) ([place])   . Depression   . Diabetes mellitus without complication ([place])   . Emphysema of lung ([place])   . GERD (gastroesophageal reflux disease)   . Hyperlipidemia   . Hypertension   . Neuromuscular disorder ([place])   . Osteoporosis   . Oxygen deficiency   . Seizures ([place])   . Skin cancer    2 areas removed more than 10  years ago  . Sleep apnea 1985   wears cpap nightly  . Thyroid disease     Past Surgical History:  Procedure Laterality Date  . APPENDECTOMY    . EYE SURGERY    . FRACTURE SURGERY    . heart stents    . JOINT REPLACEMENT    . SPINAL CORD STIMULATOR IMPLANT  2015  . SPINE SURGERY      Family History  Problem Relation Age of Onset  . Arthritis Mother   . Asthma Mother   . Cancer Mother        lung  . Depression Mother   .  Diabetes Mother   . Arthritis Father   . Asthma Father   . Depression Father   . Asthma Sister   . Depression Sister   . Diabetes Sister   . Arthritis Maternal Grandmother   . Asthma Maternal Grandmother   . Depression Maternal Grandmother   . Arthritis Maternal Grandfather   . Asthma Maternal Grandfather   . Arthritis Paternal Grandmother   . Asthma Paternal Grandmother   . Arthritis Paternal Grandfather   . Asthma Paternal Grandfather   . COPD Paternal Grandfather   . Diabetes Sister   . Mental illness Sister   . Anemia Sister   . Hypertension Son   . Post-traumatic stress disorder Son   . Allergies Daughter               Social History:  reports that she has been smoking cigarettes.  She has a 32.25 pack-year smoking history. She has never used smokeless tobacco. She reports that she does not drink alcohol or use drugs.  Allergies  Allergen Reactions  . Cephalexin Hives  . Ketorolac Tromethamine Hives  . Lac Bovis Nausea And Vomiting  . Imdur  [Isosorbide Dinitrate]   . Iodinated Diagnostic Agents   . Other Other (See Comments)    Alka seltzer causes blurred vision, fainting  . Sodium Bicarbonate-Citric Acid   . Milk-Related Compounds Nausea And Vomiting    MEDICATIONS:                                                                                                                     Scheduled: . chlorhexidine gluconate (MEDLINE KIT)  15 mL Mouth Rinse BID  . Chlorhexidine Gluconate Cloth  6 each Topical Daily  . enoxaparin  (LOVENOX) injection  40 mg Subcutaneous Q24H  . fluticasone furoate-vilanterol  1 puff Inhalation Daily  . mouth rinse  15 mL Mouth Rinse 10 times per day  . sodium chloride flush  10-40 mL Intracatheter Q12H   Continuous: . 0.9 % NaCl with KCl 40 mEq / L 125 mL/hr (01/19/18 1445)  . famotidine (PEPCID) IV    . fentaNYL infusion INTRAVENOUS 150 mcg/hr (01/19/18 1352)  . lacosamide (VIMPAT) IV Stopped (01/19/18 1053)  . levETIRAcetam Stopped (01/19/18 1115)  . midazolam (VERSED) infusion    . propofol (DIPRIVAN) infusion 75 mcg/kg/min (01/19/18 1356)   HKU:[id number] **OR** acetaminophen, bisacodyl, docusate, fentaNYL, hydrALAZINE, midazolam, [DISCONTINUED] ondansetron **OR** ondansetron (ZOFRAN) IV, sodium chloride flush   ROS:  History obtained from Patient is intubated    Blood pressure (!) 158/74, pulse 78, temperature 98.6 F (37 C), temperature source Axillary, resp. rate 18, height '5\' 3"'  (1.6 m), weight 87.2 kg (192 lb 3.9 oz), SpO2 100 %.   General Examination:                                                                                                       Physical Exam  HEENT-  Normocephalic, no lesions, without obvious abnormality.  Normal external eye and conjunctiva.   Extremities- Warm, dry and intact Musculoskeletal-no joint tenderness, deformity or swelling Skin-warm and dry, no hyperpigmentation, vitiligo, or suspicious lesions  Neurological Examination Mental Status: Currently on Propofol mcg/kg/min intubated not breathing over the ventilator.  Only responds to noxious stimuli to lower extremities by wincing Cranial Nerves: II: No blink to threat III,IV, VI: Doll's are intact.  Pupillary responses sluggish but intact 2 mm to 1 mm V,VII: s face is symmetrical  VIII: No response to voice  Motor: Patient does withdrawal  in the lower extremities from noxious stimuli by withdrawing her knees but with flexion and trying to avoid the noxious stimuli laterally.  No significant movement of the upper extremities other than wincing of her face to noxious stimuli Sensory: As above Deep Tendon Reflexes: 2+ in the upper extremities no knee jerks or ankle jerks Plantars: Right: downgoing   Left: downgoing    Lab Results: Basic Metabolic Panel: Recent Labs  Lab 01/17/18 0736 01/18/18 0515 01/19/18 0403  NA 138 139 140  K 2.7* 2.7* 4.2  CL 102 103 108  CO2 '23 26 26  ' GLUCOSE 164* 100* 91  BUN 10 5* 6*  CREATININE 0.68 0.49 0.47  CALCIUM 9.1 7.9* 7.4*  MG  --  0.9* 1.7    CBC: Recent Labs  Lab 01/17/18 0736 01/18/18 0515 01/19/18 0403  WBC 10.2 11.5* 8.2  NEUTROABS 8.0*  --   --   HGB 14.3 12.8 12.1  HCT 43.2 41.0 39.3  MCV 88.9 89.1 91.8  PLT 293 284 245    Cardiac Enzymes: No results for input(s): CKTOTAL, CKMB, CKMBINDEX, TROPONINI in the last 168 hours.  Lipid Panel: Recent Labs  Lab 01/19/18 1311  TRIG 329*    Imaging: Ct Head Wo Contrast  Result Date: 01/18/2018 CLINICAL DATA:  67 year old female with vomiting, diarrhea. Recent seizure activity, history of seizures. EXAM: CT HEAD WITHOUT CONTRAST TECHNIQUE: Contiguous axial images were obtained from the base of the skull through the vertex without intravenous contrast. COMPARISON:  None. FINDINGS: Brain: Mild motion artifact. Cerebral volume is within normal limits for age. No midline shift, ventriculomegaly, mass effect, evidence of mass lesion, intracranial hemorrhage or evidence of cortically based acute infarction. Gray-white matter differentiation is within normal limits throughout the brain. Vascular: Calcified atherosclerosis at the skull base. No suspicious intracranial vascular hyperdensity. Skull: Osteopenia.  No acute osseous abnormality identified. Sinuses/Orbits: Low-density bubbly opacity in the visible left maxillary sinus.  Other paranasal sinuses are clear (incidental osteomas in the right frontal and ethmoid sinuses). Tympanic cavities and  mastoids are clear. Other: Visualized scalp soft tissues are within normal limits. Negative visible orbits soft tissues. IMPRESSION: 1.  Normal for age non contrast CT appearance of the brain. 2. Partially visible bubbly opacity in the left maxillary sinus, consider sinusitis. Electronically Signed   By: Genevie Ann M.D.   On: 01/18/2018 12:25   Portable Chest X-ray  Result Date: 01/19/2018 CLINICAL DATA:  Status post endotracheal tube placement EXAM: PORTABLE CHEST 1 VIEW COMPARISON:  CT chest 10/28/2017 FINDINGS: Right-sided PICC line with the tip projecting over the cavoatrial junction. Endotracheal tube with the tip 6 cm above the carina. Nasogastric tube coursing below the diaphragm. Diffuse bilateral interstitial thickening. No pleural effusion or pneumothorax. Stable cardiomegaly. No aggressive osseous lesion. IMPRESSION: 1. Right-sided PICC line with the tip projecting over the cavoatrial junction. 2. Endotracheal tube with the tip 6 cm above the carina. 3. Nasogastric tube coursing below the diaphragm. 4. Findings concerning for mild CHF. Electronically Signed   By: Kathreen Devoid   On: 01/19/2018 11:48    Assessment and plan per attending neurologist  Etta Quill PA-C [facility] [phone]  01/19/2018, 4:41 PM   Assessment/ 67 year old female with known seizure history presenting to hospital secondary antiepileptic drug noncompliance after having inability to keep medications down.  At this point is unclear if patient is having nonclinical status epilepticus thus EEG will be performed.  Also, given that her seizures are apparently very subtle, I would favor overnight monitoring to ensure that they have stopped.  Recommendations:  -EEG -I will load with Depakote given that she is unable to take her p.o. Gabapentin. -Neurology will continue to follow  This  patient is critically ill and at significant risk of neurological worsening, death and care requires constant monitoring of vital signs, hemodynamics,respiratory and cardiac monitoring, neurological assessment, discussion with family, other specialists and medical decision making of high complexity. I spent 45 minutes of neurocritical care time  in the care of  this patient.  Roland Rack, MD [facility] [phone]  If 7pm- 7am, please page neurology on call as listed in [place]. 01/19/2018  7:26 PM

## 2018-01-19 NOTE — ED Provider Notes (Signed)
[facility] Provider Note   CSN: [id number] Arrival date & time: 01/17/18  5883     History   Chief Complaint Chief Complaint  Patient presents with  . Emesis    HPI Tasha Clarke is a 67 y.o. female.  Patient presents with weakness from vomiting and diarrhea.  The history is provided by the patient. No language interpreter was used.  Emesis   This is a new problem. The current episode started yesterday. The problem occurs 2 to 4 times per day. The problem has not changed since onset.The emesis has an appearance of stomach contents. There has been no fever. Associated symptoms include diarrhea. Pertinent negatives include no abdominal pain, no chills, no cough and no headaches. Risk factors include ill contacts.    Past Medical History:  Diagnosis Date  . Allergy   . Anemia   . Anxiety   . Arthritis   . Asthma   . Blood transfusion without reported diagnosis   . Cataract   . CHF (congestive heart failure) ([place])   . Chronic kidney disease   . Clotting disorder ([place])   . COPD (chronic obstructive pulmonary disease) ([place])   . Depression   . Diabetes mellitus without complication ([place])   . Emphysema of lung ([place])   . GERD (gastroesophageal reflux disease)   . Hyperlipidemia   . Hypertension   . Neuromuscular disorder ([place])   . Osteoporosis   . Oxygen deficiency   . Seizures ([place])   . Skin cancer    2 areas removed more than 10 years ago  . Sleep apnea 1985   wears cpap nightly  . Thyroid disease     Patient Active Problem List   Diagnosis Date Noted  . Hypokalemia 01/17/2018  . Seizure ([place]) 01/17/2018  . Gastroenteritis 01/17/2018  . AKI (acute kidney injury) ([place]) 10/28/2017  . Severe sepsis with septic shock ([place]) 10/28/2017  . Acute lower UTI 10/28/2017  . Hypotension   . Iron deficiency anemia 08/25/2017  . COPD (chronic obstructive pulmonary disease) ([place]) 08/24/2017  . Hyperlipidemia LDL goal <100 08/24/2017  . Seizure  disorder ([place]) 08/24/2017  . Depression, recurrent ([place]) 08/24/2017  . Osteoporosis 08/24/2017  . Chronic pain syndrome 08/24/2017  . Hypothyroid 08/24/2017  . Hypertension 08/24/2017    Past Surgical History:  Procedure Laterality Date  . APPENDECTOMY    . EYE SURGERY    . FRACTURE SURGERY    . heart stents    . JOINT REPLACEMENT    . SPINAL CORD STIMULATOR IMPLANT  2015  . SPINE SURGERY       OB History   None      Home Medications    Prior to Admission medications   Medication Sig Start Date End Date Taking? Authorizing Provider  albuterol (PROVENTIL HFA) 108 (90 Base) MCG/ACT inhaler Inhale into the lungs.   Yes [provider]  diphenhydramine-acetaminophen (TYLENOL PM EXTRA STRENGTH) 25-500 MG TABS tablet Take 1 tablet by mouth at bedtime as needed (pain).    Yes [provider]  EPINEPHrine 0.3 mg/0.3 mL IJ SOAJ injection Inject 0.3 mg into the muscle as needed.   Yes [provider]  ipratropium-albuterol (DUONEB) 0.5-2.5 (3) MG/3ML SOLN Take 3 mLs by nebulization every 6 (six) hours as needed (shortness of breathe).    Yes [provider]  Morphine-Naltrexone (EMBEDA) 30-1.2 MG CPCR Take 1 capsule by mouth 2 (two) times daily.   Yes [provider]  naloxone Karma Greaser)  nasal spray 4 mg/0.1 mL Place into the nose. 08/18/17  Yes [provider]  alendronate (FOSAMAX) 70 MG tablet Take 1 tablet (70 mg total) by mouth every Monday. Take with a full glass of water on an empty stomach. 12/05/17   Dettinger, Fransisca Kaufmann, MD  aspirin EC 81 MG tablet Take 81 mg by mouth daily.    [provider]  atorvastatin (LIPITOR) 40 MG tablet Take 40 mg by mouth daily.    [provider]  cetirizine (ZYRTEC) 10 MG tablet Take 1 tablet (10 mg total) by mouth daily. 08/31/17   Dettinger, Fransisca Kaufmann, MD  Cholecalciferol (VITAMIN D-3) 5000 units TABS Take 5,000 Units by mouth daily.    [provider]  cyclobenzaprine  (FLEXERIL) 10 MG tablet Take 10 mg by mouth 2 (two) times daily as needed for muscle spasms.    [provider]  DULoxetine (CYMBALTA) 30 MG capsule Take 30 mg by mouth daily.    [provider]  Ferrous Sulfate (IRON) 325 (65 Fe) MG TABS Take 1 tablet (325 mg total) by mouth daily. 08/27/17   Velvet Bathe, MD  fluticasone (FLONASE) 50 MCG/ACT nasal spray Place 2 sprays into both nostrils daily. 08/31/17   Dettinger, Fransisca Kaufmann, MD  fluticasone furoate-vilanterol (BREO ELLIPTA) 200-25 MCG/INH AEPB Inhale 1 puff into the lungs daily. 08/31/17   Dettinger, Fransisca Kaufmann, MD  Fluticasone-Salmeterol (ADVAIR) 250-50 MCG/DOSE AEPB Inhale 1 puff into the lungs 2 (two) times daily.    [provider]  gabapentin (NEURONTIN) 600 MG tablet Take 1 tablet (600 mg total) by mouth 4 (four) times daily. 10/26/17   Dettinger, Fransisca Kaufmann, MD  levETIRAcetam (KEPPRA) 1000 MG tablet TAKE 1 TABLET BY MOUTH AT BEDTIME 12/28/17   Dettinger, Fransisca Kaufmann, MD  levothyroxine (SYNTHROID, LEVOTHROID) 50 MCG tablet Take 50 mcg by mouth daily before breakfast.    [provider]  losartan (COZAAR) 100 MG tablet TAKE 1 2 (ONE HALF) TABLET BY MOUTH ONCE DAILY 01/02/18   [provider]  Magnesium 400 MG CAPS Take 400 mg by mouth daily.    [provider]  metoprolol tartrate (LOPRESSOR) 25 MG tablet Take 25 mg by mouth 2 (two) times daily.    [provider]  Multiple Vitamin (MULTIVITAMIN) tablet Take 1 tablet by mouth daily.    [provider]  Omega-3 Fatty Acids (FISH OIL) 1000 MG CAPS Take 1,000 mg by mouth daily.    [provider]    Family History Family History  Problem Relation Age of Onset  . Arthritis Mother   . Asthma Mother   . Cancer Mother        lung  . Depression Mother   . Diabetes Mother   . Arthritis Father   . Asthma Father   . Depression Father   . Asthma Sister   . Depression Sister   . Diabetes Sister   . Arthritis Maternal  Grandmother   . Asthma Maternal Grandmother   . Depression Maternal Grandmother   . Arthritis Maternal Grandfather   . Asthma Maternal Grandfather   . Arthritis Paternal Grandmother   . Asthma Paternal Grandmother   . Arthritis Paternal Grandfather   . Asthma Paternal Grandfather   . COPD Paternal Grandfather   . Diabetes Sister   . Mental illness Sister   . Anemia Sister   . Hypertension Son   . Post-traumatic stress disorder Son   . Allergies Daughter     Social History Social  History   Tobacco Use  . Smoking status: Current Every Day Smoker    Packs/day: 0.75    Years: 43.00    Pack years: 32.25    Types: Cigarettes  . Smokeless tobacco: Never Used  Substance Use Topics  . Alcohol use: No    Frequency: Never  . Drug use: No     Allergies   Cephalexin; Ketorolac tromethamine; Lac bovis; Imdur  [isosorbide dinitrate]; Iodinated diagnostic agents; Other; Sodium bicarbonate-citric acid; and Milk-related compounds   Review of Systems Review of Systems  Constitutional: Negative for appetite change, chills and fatigue.  HENT: Negative for congestion, ear discharge and sinus pressure.   Eyes: Negative for discharge.  Respiratory: Negative for cough.   Cardiovascular: Negative for chest pain.  Gastrointestinal: Positive for diarrhea and vomiting. Negative for abdominal pain.  Genitourinary: Negative for frequency and hematuria.  Musculoskeletal: Negative for back pain.  Skin: Negative for rash.  Neurological: Negative for seizures and headaches.  Psychiatric/Behavioral: Negative for hallucinations.     Physical Exam Updated Vital Signs BP (!) 158/74   Pulse 82   Temp 98.6 F (37 C) (Axillary)   Resp 15   Ht 5' 3" (1.6 m)   Wt 87.2 kg (192 lb 3.9 oz)   SpO2 100%   BMI 34.05 kg/m   Physical Exam  Constitutional: She is oriented to person, place, and time. She appears well-developed.  HENT:  Head: Normocephalic.  Oropharynx dry  Eyes: Conjunctivae and  EOM are normal. No scleral icterus.  Neck: Neck supple. No thyromegaly present.  Cardiovascular: Normal rate and regular rhythm. Exam reveals no gallop and no friction rub.  No murmur heard. Pulmonary/Chest: No stridor. She has no wheezes. She has no rales. She exhibits no tenderness.  Abdominal: She exhibits no distension. There is tenderness. There is no rebound.  Musculoskeletal: Normal range of motion. She exhibits no edema.  Lymphadenopathy:    She has no cervical adenopathy.  Neurological: She is oriented to person, place, and time. She exhibits normal muscle tone. Coordination normal.  Skin: No rash noted. No erythema.  Psychiatric: She has a normal mood and affect. Her behavior is normal.     ED Treatments / Results  Labs (all labs ordered are listed, but only abnormal results are displayed) Labs Reviewed  COMPREHENSIVE METABOLIC PANEL - Abnormal; Notable for the following components:      Result Value   Potassium 2.7 (*)    Glucose, Bld 164 (*)    Albumin 3.4 (*)    All other components within normal limits  CBC - Abnormal; Notable for the following components:   RDW 18.8 (*)    All other components within normal limits  URINALYSIS, ROUTINE W REFLEX MICROSCOPIC - Abnormal; Notable for the following components:   Protein, ur 30 (*)    Bacteria, UA RARE (*)    All other components within normal limits  DIFFERENTIAL - Abnormal; Notable for the following components:   Neutro Abs 8.0 (*)    All other components within normal limits  GLUCOSE, CAPILLARY - Abnormal; Notable for the following components:   Glucose-Capillary 115 (*)    All other components within normal limits  BASIC METABOLIC PANEL - Abnormal; Notable for the following components:   Potassium 2.7 (*)    Glucose, Bld 100 (*)    BUN 5 (*)    Calcium 7.9 (*)    All other components within normal limits  CBC - Abnormal; Notable for the following components:  WBC 11.5 (*)    RDW 18.6 (*)    All other  components within normal limits  MAGNESIUM - Abnormal; Notable for the following components:   Magnesium 0.9 (*)    All other components within normal limits  BASIC METABOLIC PANEL - Abnormal; Notable for the following components:   BUN 6 (*)    Calcium 7.4 (*)    All other components within normal limits  CBC - Abnormal; Notable for the following components:   RDW 18.9 (*)    All other components within normal limits  TRIGLYCERIDES - Abnormal; Notable for the following components:   Triglycerides 329 (*)    All other components within normal limits  BLOOD GAS, ARTERIAL - Abnormal; Notable for the following components:   pO2, Arterial 442 (*)    All other components within normal limits  MRSA PCR SCREENING  GASTROINTESTINAL PANEL BY PCR, STOOL (REPLACES STOOL CULTURE)  LIPASE, BLOOD  MAGNESIUM    EKG EKG Interpretation  Date/Time:  Tuesday January 17 2018 07:01:31 EDT Ventricular Rate:  93 PR Interval:    QRS Duration: 92 QT Interval:  401 QTC Calculation: 499 R Axis:   61 Text Interpretation:  Sinus rhythm Borderline repolarization abnormality Borderline prolonged QT interval Confirmed by Milton Ferguson [phone]) on 01/17/2018 1:12:07 PM   Radiology Ct Head Wo Contrast  Result Date: 01/18/2018 CLINICAL DATA:  67 year old female with vomiting, diarrhea. Recent seizure activity, history of seizures. EXAM: CT HEAD WITHOUT CONTRAST TECHNIQUE: Contiguous axial images were obtained from the base of the skull through the vertex without intravenous contrast. COMPARISON:  None. FINDINGS: Brain: Mild motion artifact. Cerebral volume is within normal limits for age. No midline shift, ventriculomegaly, mass effect, evidence of mass lesion, intracranial hemorrhage or evidence of cortically based acute infarction. Gray-white matter differentiation is within normal limits throughout the brain. Vascular: Calcified atherosclerosis at the skull base. No suspicious intracranial vascular hyperdensity.  Skull: Osteopenia.  No acute osseous abnormality identified. Sinuses/Orbits: Low-density bubbly opacity in the visible left maxillary sinus. Other paranasal sinuses are clear (incidental osteomas in the right frontal and ethmoid sinuses). Tympanic cavities and mastoids are clear. Other: Visualized scalp soft tissues are within normal limits. Negative visible orbits soft tissues. IMPRESSION: 1.  Normal for age non contrast CT appearance of the brain. 2. Partially visible bubbly opacity in the left maxillary sinus, consider sinusitis. Electronically Signed   By: Genevie Ann M.D.   On: 01/18/2018 12:25   Portable Chest X-ray  Result Date: 01/19/2018 CLINICAL DATA:  Status post endotracheal tube placement EXAM: PORTABLE CHEST 1 VIEW COMPARISON:  CT chest 10/28/2017 FINDINGS: Right-sided PICC line with the tip projecting over the cavoatrial junction. Endotracheal tube with the tip 6 cm above the carina. Nasogastric tube coursing below the diaphragm. Diffuse bilateral interstitial thickening. No pleural effusion or pneumothorax. Stable cardiomegaly. No aggressive osseous lesion. IMPRESSION: 1. Right-sided PICC line with the tip projecting over the cavoatrial junction. 2. Endotracheal tube with the tip 6 cm above the carina. 3. Nasogastric tube coursing below the diaphragm. 4. Findings concerning for mild CHF. Electronically Signed   By: Kathreen Devoid   On: 01/19/2018 11:48    Procedures Procedures (including critical care time)  Medications Ordered in ED Medications  fluticasone furoate-vilanterol (BREO ELLIPTA) 200-25 MCG/INH 1 puff (1 puff Inhalation Not Given 01/19/18 1333)  hydrALAZINE (APRESOLINE) injection 10 mg (has no administration in time range)  enoxaparin (LOVENOX) injection 40 mg (40 mg Subcutaneous Given 01/18/18 1645)  0.9 % NaCl with  KCl 40 mEq / L  infusion (125 mL/hr Intravenous New Bag/Given 01/19/18 1445)  acetaminophen (TYLENOL) tablet 650 mg ( Oral See Alternative 01/17/18 1821)    Or    acetaminophen (TYLENOL) suppository 650 mg (650 mg Rectal Given 01/17/18 1821)  ondansetron (ZOFRAN) tablet 4 mg ( Oral See Alternative 01/19/18 0540)    Or  ondansetron (ZOFRAN) injection 4 mg (4 mg Intravenous Given 01/19/18 0540)  ipratropium-albuterol (DUONEB) 0.5-2.5 (3) MG/3ML nebulizer solution 3 mL (3 mLs Nebulization Given 01/19/18 1139)  levETIRAcetam (KEPPRA) IVPB 1000 mg/100 mL premix (0 mg Intravenous Stopped 01/19/18 1115)  lacosamide (VIMPAT) 200 mg in sodium chloride 0.9 % 25 mL IVPB (0 mg Intravenous Stopped 01/19/18 1053)  famotidine (PEPCID) IVPB 20 mg premix (has no administration in time range)  propofol (DIPRIVAN) 1000 MG/100ML infusion (75 mcg/kg/min  87.2 kg Intravenous New Bag/Given 01/19/18 1356)  ipratropium-albuterol (DUONEB) 0.5-2.5 (3) MG/3ML nebulizer solution 3 mL (0 mLs Nebulization Duplicate 12/25/05 3710)  midazolam (VERSED) injection 2 mg (2 mg Intravenous Given 01/19/18 1351)  chlorhexidine gluconate (MEDLINE KIT) (PERIDEX) 0.12 % solution 15 mL (15 mLs Mouth Rinse Given 01/19/18 1224)  MEDLINE mouth rinse (15 mLs Mouth Rinse Not Given 01/19/18 1352)  sodium chloride flush (NS) 0.9 % injection 10-40 mL (has no administration in time range)  sodium chloride flush (NS) 0.9 % injection 10-40 mL (has no administration in time range)  Chlorhexidine Gluconate Cloth 2 % PADS 6 each (6 each Topical Given 01/19/18 1224)  fentaNYL (SUBLIMAZE) 2,500 mcg in sodium chloride 0.9 % 250 mL (10 mcg/mL) infusion (150 mcg/hr Intravenous Rate/Dose Change 01/19/18 1352)  fentaNYL (SUBLIMAZE) bolus via infusion 25 mcg (has no administration in time range)  ondansetron (ZOFRAN) injection 4 mg (4 mg Intravenous Not Given 01/17/18 0739)  sodium chloride 0.9 % bolus 1,000 mL (0 mLs Intravenous Stopped 01/17/18 1019)  potassium chloride 10 mEq in 100 mL IVPB (0 mEq Intravenous Stopped 01/17/18 1236)  potassium chloride 10 mEq in 100 mL IVPB (0 mEq Intravenous Stopped 01/17/18 1236)  metoCLOPramide  (REGLAN) injection 10 mg (10 mg Intravenous Given 01/17/18 1019)  loperamide (IMODIUM) capsule 4 mg (4 mg Oral Not Given 01/17/18 1300)  levETIRAcetam (KEPPRA) IVPB 1000 mg/100 mL premix (0 mg Intravenous Stopped 01/17/18 1459)  famotidine (PEPCID) IVPB 20 mg premix (0 mg Intravenous Stopped 01/17/18 1508)  hydrALAZINE (APRESOLINE) injection 10 mg (10 mg Intravenous Not Given 01/17/18 1459)  LORazepam (ATIVAN) injection 2 mg (2 mg Intravenous Given 01/17/18 1621)  LORazepam (ATIVAN) 2 MG/ML injection (2 mg  Given 01/17/18 1618)  magnesium sulfate IVPB 4 g 100 mL (0 g Intravenous Stopped 01/18/18 1036)  potassium chloride 10 mEq in 100 mL IVPB (0 mEq Intravenous Stopped 01/18/18 1550)  midazolam (VERSED) injection 4 mg (4 mg Intravenous Given 01/19/18 1235)  fentaNYL (SUBLIMAZE) injection 50 mcg (50 mcg Intravenous Given 01/19/18 1253)     Initial Impression / Assessment and Plan / ED Course  I have reviewed the triage vital signs and the nursing notes.  Pertinent labs & imaging results that were available during my care of the patient were reviewed by me and considered in my medical decision making (see chart for details).     Patient with persistent vomiting and gastroenteritis and dehydration.  She will be admitted by medicine Final Clinical Impressions(s) / ED Diagnoses   Final diagnoses:  Nausea vomiting and diarrhea    ED Discharge Orders    None       Milton Ferguson, MD 01/19/18  [place]

## 2018-01-19 NOTE — Anesthesia Procedure Notes (Signed)
Procedure Name: Intubation Performed by: Vena Rua, MD Pre-anesthesia Checklist: Patient identified, Suction available, Patient being monitored and Timeout performed Patient Re-evaluated:Patient Re-evaluated prior to induction Oxygen Delivery Method: Ambu bag Preoxygenation: Pre-oxygenation with 100% oxygen Induction Type: IV induction Laryngoscope Size: Glidescope Grade View: Grade II Tube size: 7.5 mm Number of attempts: 1 Airway Equipment and Method: Rigid stylet Placement Confirmation: ETT inserted through vocal cords under direct vision,  CO2 detector and breath sounds checked- equal and bilateral Secured at: 21 cm

## 2018-01-20 ENCOUNTER — Inpatient Hospital Stay (HOSPITAL_COMMUNITY): Payer: Medicare PPO

## 2018-01-20 LAB — GASTROINTESTINAL PANEL BY PCR, STOOL (REPLACES STOOL CULTURE)

## 2018-01-20 LAB — HEPATIC FUNCTION PANEL
ALBUMIN: 2.7 g/dL — AB (ref 3.5–5.0)
ALT: 14 U/L (ref 0–44)
AST: 30 U/L (ref 15–41)
Alkaline Phosphatase: 60 U/L (ref 38–126)
BILIRUBIN TOTAL: 0.5 mg/dL (ref 0.3–1.2)
Bilirubin, Direct: <0.1 mg/dL (ref 0.0–0.2)
Total Protein: 5.6 g/dL — ABNORMAL LOW (ref 6.5–8.1)

## 2018-01-20 LAB — GLUCOSE, CAPILLARY
GLUCOSE-CAPILLARY: 70 mg/dL (ref 70–99)
Glucose-Capillary: 105 mg/dL — ABNORMAL HIGH (ref 70–99)
Glucose-Capillary: 143 mg/dL — ABNORMAL HIGH (ref 70–99)
Glucose-Capillary: 75 mg/dL (ref 70–99)
Glucose-Capillary: 81 mg/dL (ref 70–99)
Glucose-Capillary: 85 mg/dL (ref 70–99)

## 2018-01-20 LAB — BASIC METABOLIC PANEL
Anion gap: 3 — ABNORMAL LOW (ref 5–15)
CHLORIDE: 121 mmol/L — AB (ref 98–111)
CO2: 18 mmol/L — AB (ref 22–32)
CREATININE: 0.53 mg/dL (ref 0.44–1.00)
Calcium: 6.2 mg/dL — CL (ref 8.9–10.3)
GFR calc Af Amer: >60 mL/min (ref >60)
GFR calc non Af Amer: >60 mL/min (ref >60)
GLUCOSE: 429 mg/dL — AB (ref 70–99)
Potassium: 3.1 mmol/L — ABNORMAL LOW (ref 3.5–5.1)
Sodium: 142 mmol/L (ref 135–145)

## 2018-01-20 LAB — RENAL FUNCTION PANEL
Albumin: 2.7 g/dL — ABNORMAL LOW (ref 3.5–5.0)
Anion gap: 9 (ref 5–15)
BUN: <5 mg/dL — ABNORMAL LOW (ref 8–23)
CHLORIDE: 111 mmol/L (ref 98–111)
CO2: 23 mmol/L (ref 22–32)
Calcium: 7.6 mg/dL — ABNORMAL LOW (ref 8.9–10.3)
Creatinine, Ser: 0.63 mg/dL (ref 0.44–1.00)
GFR calc Af Amer: >60 mL/min (ref >60)
Glucose, Bld: 86 mg/dL (ref 70–99)
Phosphorus: 2.4 mg/dL — ABNORMAL LOW (ref 2.5–4.6)
Potassium: 4.5 mmol/L (ref 3.5–5.1)
Sodium: 143 mmol/L (ref 135–145)

## 2018-01-20 LAB — CBC
HCT: 39.3 % (ref 36.0–46.0)
HEMOGLOBIN: 11.3 g/dL — AB (ref 12.0–15.0)
MCH: 28.3 pg (ref 26.0–34.0)
MCHC: 28.8 g/dL — ABNORMAL LOW (ref 30.0–36.0)
MCV: 98.3 fL (ref 78.0–100.0)
Platelets: 253 10*3/uL (ref 150–400)
RBC: 4 MIL/uL (ref 3.87–5.11)
RDW: 19.4 % — ABNORMAL HIGH (ref 11.5–15.5)
WBC: 9.4 10*3/uL (ref 4.0–10.5)

## 2018-01-20 LAB — MAGNESIUM
Magnesium: 1.6 mg/dL — ABNORMAL LOW (ref 1.7–2.4)
Magnesium: 0.9 mg/dL — CL (ref 1.7–2.4)

## 2018-01-20 MED ORDER — SODIUM CHLORIDE 0.9 % IV SOLN
INTRAVENOUS | Status: DC | PRN
  Administered 2018-01-21 – 2018-01-22 (×3): 250 mL via INTRAVENOUS

## 2018-01-20 MED ORDER — POTASSIUM CHLORIDE 10 MEQ/50ML IV SOLN
10.0000 meq | INTRAVENOUS | Status: AC
  Administered 2018-01-21 (×6): 10 meq via INTRAVENOUS
  Filled 2018-01-20 (×4): qty 50

## 2018-01-20 MED ORDER — GABAPENTIN 300 MG PO CAPS
600.0000 mg | ORAL_CAPSULE | Freq: Four times a day (QID) | ORAL | Status: DC
  Administered 2018-01-20 (×3): 600 mg via ORAL
  Filled 2018-01-20 (×3): qty 2

## 2018-01-20 MED ORDER — VITAL HIGH PROTEIN PO LIQD
1000.0000 mL | ORAL | Status: DC
  Administered 2018-01-20 – 2018-01-21 (×3): 1000 mL

## 2018-01-20 MED ORDER — MAGNESIUM SULFATE 4 GM/100ML IV SOLN
4.0000 g | Freq: Once | INTRAVENOUS | Status: AC
  Administered 2018-01-20: 4 g via INTRAVENOUS
  Filled 2018-01-20: qty 100

## 2018-01-20 MED ORDER — GABAPENTIN 600 MG PO TABS
600.0000 mg | ORAL_TABLET | Freq: Four times a day (QID) | ORAL | Status: DC
  Administered 2018-01-21 – 2018-01-24 (×8): 600 mg
  Filled 2018-01-20 (×14): qty 1

## 2018-01-20 MED ORDER — ATORVASTATIN CALCIUM 40 MG PO TABS
40.0000 mg | ORAL_TABLET | Freq: Every day | ORAL | Status: DC
  Administered 2018-01-21: 40 mg
  Filled 2018-01-20: qty 1

## 2018-01-20 MED ORDER — IPRATROPIUM-ALBUTEROL 0.5-2.5 (3) MG/3ML IN SOLN
3.0000 mL | Freq: Four times a day (QID) | RESPIRATORY_TRACT | Status: DC
  Administered 2018-01-20 – 2018-01-21 (×5): 3 mL via RESPIRATORY_TRACT
  Filled 2018-01-20 (×6): qty 3

## 2018-01-20 MED ORDER — ACETAMINOPHEN 160 MG/5ML PO SOLN
650.0000 mg | Freq: Four times a day (QID) | ORAL | Status: DC | PRN
  Administered 2018-01-21 – 2018-01-22 (×4): 650 mg
  Filled 2018-01-20 (×4): qty 20.3

## 2018-01-20 MED ORDER — SODIUM CHLORIDE 0.9 % IV SOLN
1.0000 g | Freq: Once | INTRAVENOUS | Status: AC
  Administered 2018-01-20: 1 g via INTRAVENOUS
  Filled 2018-01-20: qty 10

## 2018-01-20 MED ORDER — GABAPENTIN 600 MG PO TABS
600.0000 mg | ORAL_TABLET | Freq: Four times a day (QID) | ORAL | Status: DC
  Filled 2018-01-20: qty 1

## 2018-01-20 NOTE — Procedures (Signed)
LTM-EEG Report  HISTORY: Continuous video-EEG monitoring performed for 67 year old with seizures, presenting with increased seizure frequency and confusion. ACQUISITION: International 10-20 system for electrode placement; 18 channels with additional eyes linked to ipsilateral ears and EKG. Additional T1-T2 electrodes were used. Continuous video recording obtained.   EEG NUMBER:  MEDICATIONS:  Day 1: LEV, LCM, VPA, MDZ  DAY #1: from 1816 01/19/18 to 0730 01/20/18  BACKGROUND: An overall medium voltage continuous recording with poor spontaneous variability and reactivity. The background consisted of medium voltage theta and delta frequencies bilaterally with sparse superimposed faster frequencies. Superimposed bifrontal 1hz  rhythmic slowing was seen during drowsiness and waking (atypical arousal pattern). Sleep was characterized by lower voltage and more theta than delta activity with some sleep spindles.  EPILEPTIFORM/PERIODIC ACTIVITY: none  SEIZURES: none  EVENTS: none   EKG: no significant arrhythmia  SUMMARY: This was a moderately abnormal continuous video EEG due to generalized slowing with atypical arousal patterns, indicative of a diffuse cerebral disturbance/encephalopathy pattern. No definite epileptiform discharges or seizures were seen.

## 2018-01-20 NOTE — Progress Notes (Signed)
CRITICAL VALUE ALERT  Critical Value:  Calcium 6.2; Magnesium 0.9  Date & Time Notied:  01/20/2018 2310  Provider Notified: [place] CCMD  Orders Received/Actions taken: MD notified, orders received

## 2018-01-20 NOTE — Progress Notes (Signed)
Initial Nutrition Assessment  DOCUMENTATION CODES:   Obesity unspecified  INTERVENTION:   Recommend Vital High Protein @ 50 ml/hr (1200 ml/day) Provides: 1200 kcal, 105 grams protein, and 1003 ml free water.    NUTRITION DIAGNOSIS:   Inadequate oral intake related to inability to eat as evidenced by NPO status.  GOAL:   Provide needs based on ASPEN/SCCM guidelines  MONITOR:   I & O's, Vent status  REASON FOR ASSESSMENT:   Ventilator, Malnutrition Screening Tool    ASSESSMENT:   Pt with PMH of CHF, COPD, DM, GERD, Depression, seizures and anemia admitted with several days of N/V/D unable to take seizure medications having breakthrough seizures. Pt with newly identified gastric mass. Intubated for airway protection.    Pt discussed during ICU rounds and with RN.   Spoke with pt's husband at bedside. Per husband pt eats 2 meals per day, breakfast and dinner.  They both cook but mostly he does the cooking.  Pt has recently received IV iron for her anemia which had improved. Pt is mobile at home but does suffer from chronic pain.   Patient is currently intubated on ventilator support Temp (24hrs), Avg:98 F (36.7 C), Min:97.3 F (36.3 C), Max:99 F (37.2 C)  Propofol: none Medications reviewed and include:  D5 NS @ 75 ml/hr Neo @ 50 mcg Labs reviewed: Magnesium 1.6 (L), PO4: 2.4 CBG (last 3)  Recent Labs    01/20/18 0037 01/20/18 0421 01/20/18 0800  GLUCAP 105* 81 75     NUTRITION - FOCUSED PHYSICAL EXAM:    Most Recent Value  Orbital Region  No depletion  Upper Arm Region  No depletion  Thoracic and Lumbar Region  No depletion  Buccal Region  Unable to assess  Temple Region  No depletion  Clavicle Bone Region  No depletion  Clavicle and Acromion Bone Region  No depletion  Scapular Bone Region  Unable to assess  Dorsal Hand  No depletion  Patellar Region  No depletion  Anterior Thigh Region  No depletion  Posterior Calf Region  No depletion   Edema (RD Assessment)  None  Hair  Reviewed  Eyes  Unable to assess  Mouth  Unable to assess  Skin  Reviewed  Nails  Reviewed       Diet Order:   Diet Order           Diet NPO time specified  Diet effective now          EDUCATION NEEDS:   No education needs have been identified at this time  Skin:  Skin Assessment: Reviewed RN Assessment  Last BM:  7/11  Height:   Ht Readings from Last 1 Encounters:  01/19/18 5\' 3"  (1.6 m)    Weight:   Wt Readings from Last 1 Encounters:  01/19/18 194 lb 10.7 oz (88.3 kg)    Ideal Body Weight:  52.2 kg  BMI:  Body mass index is 34.48 kg/m.  Estimated Nutritional Needs:   Kcal:  5027-7412  Protein:  >104 grams   Fluid:  > 1.5 L/day  Maylon Peppers RD, LDN, CNSC [phone] Pager [phone] After Hours Pager

## 2018-01-20 NOTE — Progress Notes (Signed)
LTM maint. Complete. No skin break down at P3, O1, FP1, or FP2.

## 2018-01-20 NOTE — Progress Notes (Signed)
CRITICAL VALUE ALERT  Critical Value:  cbg 72  Date & Time Notied:  01/19/2018 2325  Provider Notified: DETERDING CCMD  Orders Received/Actions taken: give 1 full amp of D50 and re-check at 0037 was 105; and maintenance fluids changed to D5 w/ NS @75 

## 2018-01-20 NOTE — Progress Notes (Signed)
Pt coughing, trying to reach for ETT with rt hand, vent alarming, tachy6pneic. Fentanyl gtt restarted for sedation.

## 2018-01-20 NOTE — Progress Notes (Signed)
CRITICAL VALUE ALERT  Critical Value:  cbg 59   Date & Time Notied:  01/19/2018 2027  Provider Notified: ccmd   Orders Received/Actions taken: 1/2 amp D50 and re-check at 2128 after giving was 84

## 2018-01-20 NOTE — Progress Notes (Signed)
PULMONARY / CRITICAL CARE MEDICINE   Name: Tasha Clarke MRN: [id number] DOB: 1951-03-27    ADMISSION DATE:  01/17/2018 CONSULTATION DATE:  01/19/2017  REFERRING MD:  Dr. Roderic Palau  CHIEF COMPLAINT:  Nausea, Vomiting, and Status Epilepticus   HISTORY OF PRESENT ILLNESS:   HPI obtained from medical chart review and per husband at bedside, as patient is currently intubated and sedated on mechanical ventilation.   67 year old female with past medical history significant for COPD on home O2 2L baseline, ongoing tobacco abuse, seizure disorder, chronic pain syndrome, GERD, DM, HTN, and CKD admitted to [facility] after 2-day history of nausea, vomiting, and diarrhea.    Last seizure reportedly Oct 2017 per husband. She was unable to take her medications, including keppra, for several days 2-3.  She was treated supportively for nausea and vomiting.  GI panel pending.  Subsequently, she had multiple seizures treated with Ativan.  She was started on IV Keppra.  Neurology was consulted on 7/10, with EEG showing single episode of right temporal slowing.  IV Vimpat was added.  CT head negative. Patient continued to have multiple seizures therefore she was electively intubated today for airway protection for concern of status epilepticus.  She has been afebrile and hemodynamically stable.  PCCM accepted transfer as patient will require continuous EEG monitoring, neurology to consult.  SUBJECTIVE:  No further seizure activity reported EEG initial read reviewed, no epileptiform activity seen, general slowing and encephalopathy Sedating infusion discontinued about 1 hour ago Continuous EEG running  VITAL SIGNS: BP (!) 107/59   Pulse 70   Temp 98.8 F (37.1 C)   Resp (!) 24   Ht 5\' 3"  (1.6 m)   Wt 88.3 kg (194 lb 10.7 oz)   SpO2 100%   BMI 34.48 kg/m   HEMODYNAMICS:    VENTILATOR SETTINGS: Vent Mode: PRVC FiO2 (%):  [40 %-50 %] 40 % Set Rate:  [16 bmp] 16 bmp Vt Set:  [420 mL] 420  mL PEEP:  [5 cmH20] 5 cmH20 Plateau Pressure:  [14 cmH20-23 cmH20] 22 cmH20  INTAKE / OUTPUT: I/O last 3 completed shifts: In: 7230.2 [I.V.:5311.3; IV Piggyback:1918.9] Out: 1350 [DVVOH:6073]  PHYSICAL EXAMINATION: General: Ill-appearing obese woman, intubated, sedated HEENT: ET tube in place, pupils equal and reactive, no oral lesions Neuro: Opens eyes to voice, may have tracked, did not follow verbal commands, some spontaneous movement of upper extremities CV: Regular, no murmur PULM: No wheezing, no crackles, diminished at bilateral bases GI: Soft, no apparent tenderness, positive bowel sounds Extremities: Trace bilateral pretibial edema Skin: No rash  LABS:  BMET Recent Labs  Lab 01/18/18 0515 01/19/18 0403 01/20/18 0504  NA 139 140 143  K 2.7* 4.2 4.5  CL 103 108 111  CO2 26 26 23   BUN 5* 6* <5*  CREATININE 0.49 0.47 0.63  GLUCOSE 100* 91 86    Electrolytes Recent Labs  Lab 01/18/18 0515 01/19/18 0403 01/20/18 0504  CALCIUM 7.9* 7.4* 7.6*  MG 0.9* 1.7 1.6*  PHOS  --   --  2.4*    CBC Recent Labs  Lab 01/18/18 0515 01/19/18 0403 01/20/18 0504  WBC 11.5* 8.2 9.4  HGB 12.8 12.1 11.3*  HCT 41.0 39.3 39.3  PLT 284 245 253    Coag's No results for input(s): APTT, INR in the last 168 hours.  Sepsis Markers No results for input(s): LATICACIDVEN, PROCALCITON, O2SATVEN in the last 168 hours.  ABG Recent Labs  Lab 01/19/18 1410  PHART 7.395  PCO2ART 38.1  PO2ART 442*    Liver Enzymes Recent Labs  Lab 01/17/18 0736 01/20/18 0504  AST 23 30  ALT 15 14  ALKPHOS 92 60  BILITOT 0.6 0.5  ALBUMIN 3.4* 2.7*  2.7*    Cardiac Enzymes No results for input(s): TROPONINI, PROBNP in the last 168 hours.  Glucose Recent Labs  Lab 01/19/18 2027 01/19/18 2128 01/19/18 2335 01/20/18 0037 01/20/18 0421 01/20/18 0800  GLUCAP 66* 84 59* 105* 81 75    Imaging Dg Chest Port 1 View  Result Date: 01/20/2018 CLINICAL DATA:  ETT/VENT, HX N/V/D  FOR SEVERAL DAYS.,CHF,COPD,HTN.ASTHMA,CKD EXAM: PORTABLE CHEST 1 VIEW COMPARISON:  Chest x-rays dated 01/19/2018 and 10/28/2017. FINDINGS: Stable cardiomegaly. Overall cardiomediastinal silhouette is stable. Endotracheal tube appears adequately positioned with tip approximately 4 cm above the carina. Enteric tube passes below the diaphragm. RIGHT-sided PICC line appears adequately positioned with tip at the level of the mid SVC. Stimulator hardware within the midline of the thoracic spine. Lungs are clear. No pneumothorax or significant pleural effusion is seen. No acute or suspicious osseous finding. Atherosclerotic changes again noted at the aortic arch. IMPRESSION: 1. No active disease. No evidence of pneumonia or pulmonary edema on today's exam. 2. Support apparatus appears adequately positioned. 3. Stable cardiomegaly. 4. Aortic atherosclerosis. Electronically Signed   By: Franki Cabot M.D.   On: 01/20/2018 08:44   Dg Chest Port 1 View  Result Date: 01/19/2018 CLINICAL DATA:  66 y/o  F; ET tube placement. EXAM: PORTABLE CHEST 1 VIEW COMPARISON:  01/19/2018 chest radiograph. FINDINGS: Endotracheal tube tip projects 6.5 cm above carina. Enteric tube tip is below the field of view in the abdomen. Right PICC line tip projects over mid SVC. Stable cardiomegaly and aortic atherosclerosis. Mild interval decrease in pulmonary vascular congestion. No new consolidation, effusion, or pneumothorax. Bones are unremarkable. Partially visualized anterior cervical fusion hardware noted. IMPRESSION: 1. Endotracheal tube tip projects 6.5 cm above carina. 2. Enteric tube tip extends below field of view into abdomen. 3. Decreased pulmonary vascular congestion.  Stable cardiomegaly. Electronically Signed   By: Kristine Garbe M.D.   On: 01/19/2018 17:20   STUDIES:  7/9 CT abd/pelvis >> 1. Gastric wall thickening along the posterior aspect of the fundus and cardia with potential polypoid extension into the gastric  lumen. Underlying gastric mass cannot be excluded. There also may be component of a small diverticulum versus area of ulceration along the posterior wall of the proximal stomach. Correlation with endoscopy may be helpful if there are any symptoms suggestive of gastric inflammation or pathology. 2. Possible enteritis involving the small bowel with scattered fluid throughout the small bowel. 3. 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  7/10 CTH >> 1.  Normal for age non contrast CT appearance of the brain. 2. Partially visible bubbly opacity in the left maxillary sinus, consider sinusitis.  7/10 EEG >> 1. Single episode of right temporal slowing is observed. This can be associated with epileptic focus. There is no evidence of continues electrographic seizures. 2. Excessive spindles are observed. Undoubtedly, this is due to benzodiazepine.  CULTURES: 7/9 MRSA PCR >> neg 7/11 GI panel >>  ANTIBIOTICS: n/a  SIGNIFICANT EVENTS: 7/9 Admit to AP 7/11 Intubated/ tx to [facility]  LINES/TUBES: 7/9 DL R PICC >> 7/11 ETT >> 7/11 OGT >> 7/11 Foley >>  DISCUSSION: 59 yoF with seizure hx presenting with several days of N/V/D, unable to take meds with breakthrough seizures.  Intubated on 7/11 for  airway protection in concern for SE and transfer to [facility] for LTM.  ASSESSMENT / PLAN:  PULMONARY A: Respiratory insufficiency in the setting of SE COPD Tobacco abuse  P:   Continue current ventilator support, 8 cc/kg Follow chest x-ray intermittently VAP prevention bundle She should be a candidate for spontaneous breathing trials once we have lightened sedation.  Will review with neurology, ensure no evidence of recurrent seizure activity prior to consideration of extubation. Stop Breo Restart DuoNeb  CARDIOVASCULAR A:  Hypotension Hx HTN, HLD - prior TTE 2/19 with normal EF/ diastolic dysfunction P:  Home metoprolol, losartan are on hold Aspirin  on hold Wean phenylephrine as tolerated for MAP > 65, suspect that this is sedation related.  Continue atorvastatin  RENAL A:   No acute issues P:   Decrease IV fluids 7/12 Follow BMP, urine output Replace elect lites if indicated Avoid nephrotoxin agents  GASTROINTESTINAL A:   Acute gastroenteritis  Concern for gastric mass - prior chest CT 10/2017 concerning for esophagitis and small hiatial hernia - no BM 7/10, last BM 7/11 P:   GI panel pending, currently on enteric isolation N.p.o. for now, OGT in place Initiate trickle tube feeding if we are unable to wake up and extubate on 7/12 LFT reassuring Consider GI evaluation to discuss possible gastric mass once stabilized from this neurological condition  HEMATOLOGIC A:   Chronic normocytic anemia- stable P:  Enoxaparin Follow CBC intermittently  INFECTIOUS A:   Gastroenteritis  At risk for aspiration - currently afebrile, normal WBC P:   Continue enteric precautions GI panel pending- just sent today due to no stools Monitor clinically Trend WBC/ fever curve   ENDOCRINE A:   DM Hypothyroidism P:   Continue Synthroid Follow CBG, initiate sliding scale insulin  NEUROLOGIC A:   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  Note restart gabapentin, other antiepileptics being adjusted. Sedating infusion discontinued and waiting for the patient to wake up.  If no evidence of seizure activity then we will discuss possibly working for SBT, extubation.  FAMILY  - Updates: Husband, [name], updated at bedside 7/11.    - Inter-disciplinary family meet or Palliative Care meeting due by:  7/18  Independent CC time 4 minutes  Baltazar Apo, MD, PhD 01/20/2018, 12:13 PM [facility] [phone] or if no answer [phone]

## 2018-01-20 NOTE — Progress Notes (Signed)
eLink Physician-Brief Progress Note Patient Name: FAHMIDA JURICH DOB: March 24, 1951 MRN: [id number]   Date of Service  01/20/2018  HPI/Events of Note  Hypokalemia Hypomag Hypocalcemia  eICU Interventions  Potassium, Mag and calcium replaced     Intervention Category Intermediate Interventions: Electrolyte abnormality - evaluation and management  DETERDING,ELIZABETH 01/20/2018, 11:12 PM

## 2018-01-20 NOTE — Progress Notes (Signed)
Patient having frequent PVCs. CCMD notified and orders received to draw labs. RN will continue to monitor

## 2018-01-20 NOTE — Progress Notes (Signed)
Subjective: No sz overnight  Exam: Vitals:   01/20/18 0900 01/20/18 1000  BP: (!) 105/46 (!) 86/77  Pulse: 69 68  Resp: 11 11  Temp: 98.6 F (37 C) 98.8 F (37.1 C)  SpO2: 100% 100%   Gen: In bed, intubated Resp: ventilated Abd: soft, nt  Neuro: MS: sedated, does not follow commands CN: PERRL, slightly dysconjugate.  Motor: withdraws from noxious stimuli Sensory:as above.   Pertinent Labs: Cr 0.6  Impression: 67 yo F with breakthrough seizures in the setting of enterocolitis and inability to take meds. I will restart her gabapentin today, but will continue vimpat while still with concern for absorption  Recommendations: 1) d/c depakote 2) continue vimpat and keppra.  3) restart gabapentin.  4) D/C benzos  This patient is critically ill and at significant risk of neurological worsening, death and care requires constant monitoring of vital signs, hemodynamics,respiratory and cardiac monitoring, neurological assessment, discussion with family, other specialists and medical decision making of high complexity. I spent 40 minutes of neurocritical care time  in the care of  this patient.  Roland Rack, MD [facility] [phone]  If 7pm- 7am, please page neurology on call as listed in [place]. 01/20/2018  11:08 AM

## 2018-01-21 ENCOUNTER — Inpatient Hospital Stay (HOSPITAL_COMMUNITY): Payer: Medicare PPO

## 2018-01-21 DIAGNOSIS — J439 Emphysema, unspecified: Secondary | ICD-10-CM

## 2018-01-21 DIAGNOSIS — G9340 Encephalopathy, unspecified: Secondary | ICD-10-CM

## 2018-01-21 DIAGNOSIS — J9601 Acute respiratory failure with hypoxia: Secondary | ICD-10-CM

## 2018-01-21 LAB — CBC
HEMATOCRIT: 37.1 % (ref 36.0–46.0)
HEMOGLOBIN: 11 g/dL — AB (ref 12.0–15.0)
MCH: 28.4 pg (ref 26.0–34.0)
MCHC: 29.6 g/dL — AB (ref 30.0–36.0)
MCV: 95.6 fL (ref 78.0–100.0)
Platelets: 189 10*3/uL (ref 150–400)
RBC: 3.88 MIL/uL (ref 3.87–5.11)
RDW: 18.8 % — AB (ref 11.5–15.5)
WBC: 10.2 10*3/uL (ref 4.0–10.5)

## 2018-01-21 LAB — BASIC METABOLIC PANEL
ANION GAP: 7 (ref 5–15)
Anion gap: 8 (ref 5–15)
BUN: 5 mg/dL — AB (ref 8–23)
CALCIUM: 8.3 mg/dL — AB (ref 8.9–10.3)
CALCIUM: 8.6 mg/dL — AB (ref 8.9–10.3)
CO2: 22 mmol/L (ref 22–32)
CO2: 23 mmol/L (ref 22–32)
CREATININE: 0.5 mg/dL (ref 0.44–1.00)
Chloride: 110 mmol/L (ref 98–111)
Chloride: 105 mmol/L (ref 98–111)
Creatinine, Ser: 0.5 mg/dL (ref 0.44–1.00)
GFR calc Af Amer: >60 mL/min (ref >60)
GFR calc Af Amer: >60 mL/min (ref >60)
GFR calc non Af Amer: >60 mL/min (ref >60)
GLUCOSE: 142 mg/dL — AB (ref 70–99)
GLUCOSE: 139 mg/dL — AB (ref 70–99)
POTASSIUM: 4.3 mmol/L (ref 3.5–5.1)
POTASSIUM: 3.9 mmol/L (ref 3.5–5.1)
SODIUM: 139 mmol/L (ref 135–145)
SODIUM: 136 mmol/L (ref 135–145)

## 2018-01-21 LAB — GLUCOSE, CAPILLARY
GLUCOSE-CAPILLARY: 121 mg/dL — AB (ref 70–99)
Glucose-Capillary: 104 mg/dL — ABNORMAL HIGH (ref 70–99)
Glucose-Capillary: 112 mg/dL — ABNORMAL HIGH (ref 70–99)
Glucose-Capillary: 117 mg/dL — ABNORMAL HIGH (ref 70–99)
Glucose-Capillary: 138 mg/dL — ABNORMAL HIGH (ref 70–99)

## 2018-01-21 LAB — MAGNESIUM: MAGNESIUM: 1.9 mg/dL (ref 1.7–2.4)

## 2018-01-21 MED ORDER — MAGNESIUM SULFATE 2 GM/50ML IV SOLN
2.0000 g | Freq: Once | INTRAVENOUS | Status: AC
  Administered 2018-01-21: 2 g via INTRAVENOUS
  Filled 2018-01-21: qty 50

## 2018-01-21 MED ORDER — FUROSEMIDE 10 MG/ML IJ SOLN
40.0000 mg | Freq: Four times a day (QID) | INTRAMUSCULAR | Status: AC
  Administered 2018-01-21 (×3): 40 mg via INTRAVENOUS
  Filled 2018-01-21 (×3): qty 4

## 2018-01-21 MED ORDER — POTASSIUM CHLORIDE 20 MEQ/15ML (10%) PO SOLN
40.0000 meq | Freq: Three times a day (TID) | ORAL | Status: AC
  Administered 2018-01-21 (×2): 40 meq
  Filled 2018-01-21 (×2): qty 30

## 2018-01-21 NOTE — Progress Notes (Signed)
Wasted 60cc versed with Roddie Mc RN.

## 2018-01-21 NOTE — Progress Notes (Signed)
PULMONARY / CRITICAL CARE MEDICINE   Name: Tasha Clarke MRN: [id number] DOB: [birth year]    ADMISSION DATE:  01/17/2018 CONSULTATION DATE:  01/19/2017  REFERRING MD:  Dr. Roderic Palau  CHIEF COMPLAINT:  Nausea, Vomiting, and Status Epilepticus   HISTORY OF PRESENT ILLNESS:   HPI obtained from medical chart review and per husband at bedside, as patient is currently intubated and sedated on mechanical ventilation.   67 year old female with past medical history significant for COPD on home O2 2L baseline, ongoing tobacco abuse, seizure disorder, chronic pain syndrome, GERD, DM, HTN, and CKD admitted to [facility] after 2-day history of nausea, vomiting, and diarrhea.    Last seizure reportedly Oct 2017 per husband. She was unable to take her medications, including keppra, for several days 2-3.  She was treated supportively for nausea and vomiting.  GI panel pending.  Subsequently, she had multiple seizures treated with Ativan.  She was started on IV Keppra.  Neurology was consulted on 7/10, with EEG showing single episode of right temporal slowing.  IV Vimpat was added.  CT head negative. Patient continued to have multiple seizures therefore she was electively intubated today for airway protection for concern of status epilepticus.  She has been afebrile and hemodynamically stable.  PCCM accepted transfer as patient will require continuous EEG monitoring, neurology to consult.  SUBJECTIVE:  No events overnight, no further seizures  VITAL SIGNS: BP (!) 115/50   Pulse 86   Temp 99.5 F (37.5 C)   Resp 17   Ht 5\' 3"  (1.6 m)   Wt 206 lb 9.1 oz (93.7 kg)   SpO2 100%   BMI 36.59 kg/m   HEMODYNAMICS:    VENTILATOR SETTINGS: Vent Mode: PRVC FiO2 (%):  [40 %] 40 % Set Rate:  [16 bmp] 16 bmp Vt Set:  [420 mL] 420 mL PEEP:  [5 cmH20] 5 cmH20 Plateau Pressure:  [9 cmH20-10 cmH20] 9 cmH20  INTAKE / OUTPUT: I/O last 3 completed shifts: In: 9963.2 [I.V.:4322.2; IV OVFIEPPIR:5188] Out:  2380 [Urine:2380]  PHYSICAL EXAMINATION: General: Chronically ill appearing female, sedate, unresponsive HEENT: ETT in place, [place]/AT, PERRL, EOM-I and MMM Neuro: Sedate, withdraws ext to pain but not command CV: RRR, Nl S1/S2 and -M/R/G PULM: CTA bilaterally GI: Soft, NT, ND and +BS Extremities: -edema and -tenderness Skin: No rash  LABS:  BMET Recent Labs  Lab 01/20/18 0504 01/20/18 2051 01/21/18 0646  NA 143 142 139  K 4.5 3.1* 4.3  CL 111 121* 110  CO2 23 18* 22  BUN <5* <5* <5*  CREATININE 0.63 0.53 0.50  GLUCOSE 86 429* 142*   Electrolytes Recent Labs  Lab 01/20/18 0504 01/20/18 2051 01/21/18 0646  CALCIUM 7.6* 6.2* 8.3*  MG 1.6* 0.9* 1.9  PHOS 2.4*  --   --    CBC Recent Labs  Lab 01/19/18 0403 01/20/18 0504 01/21/18 0646  WBC 8.2 9.4 10.2  HGB 12.1 11.3* 11.0*  HCT 39.3 39.3 37.1  PLT 245 253 189   Coag's No results for input(s): APTT, INR in the last 168 hours.  Sepsis Markers No results for input(s): LATICACIDVEN, PROCALCITON, O2SATVEN in the last 168 hours.  ABG Recent Labs  Lab 01/19/18 1410  PHART 7.395  PCO2ART 38.1  PO2ART 442*   Liver Enzymes Recent Labs  Lab 01/17/18 0736 01/20/18 0504  AST 23 30  ALT 15 14  ALKPHOS 92 60  BILITOT 0.6 0.5  ALBUMIN 3.4* 2.7*  2.7*   Cardiac Enzymes No  results for input(s): TROPONINI, PROBNP in the last 168 hours.  Glucose Recent Labs  Lab 01/20/18 0421 01/20/18 0800 01/20/18 1242 01/20/18 1613 01/20/18 2041 01/21/18 0753  GLUCAP 81 75 70 85 143* 112*   Imaging Dg Chest Port 1 View  Result Date: 01/21/2018 CLINICAL DATA:  Acute respiratory failure EXAM: PORTABLE CHEST 1 VIEW COMPARISON:  01/20/2018 FINDINGS: Endotracheal tube remains in good position. NG tube in the stomach. Right arm PICC tip in the mid SVC. Cardiac enlargement without heart failure or pneumonia. Mild bibasilar atelectasis. No effusion. Spinal cord stimulator noted. IMPRESSION: Support lines remain in good  position. Mild bibasilar atelectasis unchanged from the prior study. Electronically Signed   By: Franchot Gallo M.D.   On: 01/21/2018 08:49   STUDIES:  7/9 CT abd/pelvis >> 1. Gastric wall thickening along the posterior aspect of the fundus and cardia with potential polypoid extension into the gastric lumen. Underlying gastric mass cannot be excluded. There also may be component of a small diverticulum versus area of ulceration along the posterior wall of the proximal stomach. Correlation with endoscopy may be helpful if there are any symptoms suggestive of gastric inflammation or pathology. 2. Possible enteritis involving the small bowel with scattered fluid throughout the small bowel. 3. 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  7/10 CTH >> 1.  Normal for age non contrast CT appearance of the brain. 2. Partially visible bubbly opacity in the left maxillary sinus, consider sinusitis.  7/10 EEG >> 1. Single episode of right temporal slowing is observed. This can be associated with epileptic focus. There is no evidence of continues electrographic seizures. 2. Excessive spindles are observed. Undoubtedly, this is due to benzodiazepine.  CULTURES: 7/9 MRSA PCR >> neg 7/11 GI panel >>  ANTIBIOTICS: n/a  SIGNIFICANT EVENTS: 7/9 Admit to AP 7/11 Intubated/ tx to [facility]  LINES/TUBES: 7/9 DL R PICC >> 7/11 ETT >> 7/11 OGT >> 7/11 Foley >>  DISCUSSION: 73 yoF with seizure hx presenting with several days of N/V/D, unable to take meds with breakthrough seizures.  Intubated on 7/11 for airway protection in concern for SE and transfer to [facility] for LTM.  ASSESSMENT / PLAN:  PULMONARY A: Respiratory insufficiency in the setting of SE COPD Tobacco abuse  P:   Continue current ventilator support, 8 cc/kg Begin PS trials if mental status improves off sedation Follow chest x-ray intermittently VAP prevention bundle Diureses (postive 10 liters  in 2 days) KVO IVF Stop Breo Restart DuoNeb  CARDIOVASCULAR A:  Hypotension Hx HTN, HLD - prior TTE 2/19 with normal EF/ diastolic dysfunction P:  Home metoprolol, losartan are on hold Aspirin on hold Wean phenylephrine as tolerated for MAP > 65, suspect that this is sedation related.  Continue atorvastatin  RENAL A:   No acute issues P:   KVO IVF Lasix 40 mg IV q6 x3 doses K and Mg replacement Follow BMP, urine output Replace elect lites if indicated  GASTROINTESTINAL A:   Acute gastroenteritis  Concern for gastric mass - prior chest CT 10/2017 concerning for esophagitis and small hiatial hernia - no BM 7/10, last BM 7/11 P:   NPO for now, OGT in place Initiate trickle tube feeding if we are unable to wake up and extubate on 7/12 LFT reassuring Will need a GI evaluation to discuss possible gastric mass once stabilized from this neurological condition  HEMATOLOGIC A:   Chronic normocytic anemia- stable P:  Enoxaparin Follow CBC intermittently  INFECTIOUS A:  Gastroenteritis  At risk for aspiration Currently afebrile, normal WBC P:   Continue enteric precautions GI panel pending- just sent today due to no stools Monitor clinically Trend WBC/ fever curve   ENDOCRINE A:   DM Hypothyroidism P:   Continue Synthroid Follow CBG, initiate sliding scale insulin  NEUROLOGIC A:   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  Note restart gabapentin, other antiepileptics being adjusted. D/C propofol Hold fentanyl to evaluate mental status but she is on extremely high doses of narcotics at home so watch for withdrawal.  FAMILY  - Updates: No family bedside 7/13  - Inter-disciplinary family meet or Palliative Care meeting due by:  7/18  The patient is critically ill with multiple organ systems failure and requires high complexity decision making for assessment  and support, frequent evaluation and titration of therapies, application of advanced monitoring technologies and extensive interpretation of multiple databases.   Critical Care Time devoted to patient care services described in this note is  32  Minutes. This time reflects time of care of this signee Dr Jennet Maduro. This critical care time does not reflect procedure time, or teaching time or supervisory time of PA/NP/Med student/Med Resident etc but could involve care discussion time.  Rush Farmer, M.D. [facility] Pulmonary/Critical Care Medicine. Pager: [phone]. After hours pager: [phone]

## 2018-01-21 NOTE — Progress Notes (Signed)
Notified Dr Elwyn Reach that patients fever had spiked throughout the shift. Patient was also continuing to have an increase in PVCs.I had also attempted to stop patients fentanyl gtt per Dr Nelda Marseille. However, patient became tachypneaic, tachycardiac and also restless. Fentanyl gtt was increased to 26mcg. Dr Elwyn Reach ordered a BMP, sputum culture, and blood cultures. Will continue to monitor patient.

## 2018-01-21 NOTE — Procedures (Signed)
LTM-EEG Report  HISTORY: Continuous video-EEG monitoring performed for 67 year old with seizures, presenting with increased seizure frequency and confusion. ACQUISITION: International 10-20 system for electrode placement; 18 channels with additional eyes linked to ipsilateral ears and EKG. Additional T1-T2 electrodes were used. Continuous video recording obtained.   EEG NUMBER:  MEDICATIONS:  Day 1: LEV, LCM, VPA, MDZ Day 2: LEV, LCM, GBP  DAY #1: from 1816 01/19/18 to 0730 01/20/18  BACKGROUND: An overall medium voltage continuous recording with poor spontaneous variability and reactivity. The background consisted of medium voltage theta and delta frequencies bilaterally with sparse superimposed faster frequencies. Superimposed bifrontal 1hz  rhythmic slowing was seen during drowsiness and waking (atypical arousal pattern). Sleep was characterized by lower voltage and more theta than delta activity with some sleep spindles.  EPILEPTIFORM/PERIODIC ACTIVITY: none  SEIZURES: none  EVENTS: none   DAY #2: from 0730 01/20/18 to 0730 01/21/18  BACKGROUND: An overall medium voltage continuous recording with poor spontaneous variability and reactivity. The background consisted of medium voltage theta and delta frequencies bilaterally with sparse superimposed faster frequencies. Superimposed bifrontal 1hz  rhythmic slowing was seen during drowsiness and waking (atypical arousal pattern). Sleep was characterized by lower voltage and more theta than delta activity with some sleep spindles.  EPILEPTIFORM/PERIODIC ACTIVITY: Several runs of bifrontal rhythmic delta (GRDA) or blunted periodic discharges (GPD) with 1Hz  frequency were seen during state changes without ictal evolution.  SEIZURES: none  EVENTS: none   EKG: no significant arrhythmia  SUMMARY: This was a moderately abnormal continuous video EEG due to generalized slowing with atypical arousal patterns, indicative of a diffuse cerebral  disturbance/encephalopathy pattern. No definite epileptiform discharges or seizures were seen.

## 2018-01-21 NOTE — Progress Notes (Signed)
eLink Physician-Brief Progress Note Patient Name: Tasha Clarke DOB: [birth year] MRN: [id number]   Date of Service  01/21/2018  HPI/Events of Note  Agitation - Request to renew bilateral soft wrist restraints.   eICU Interventions  Will renew bilateral soft wrist restraint orders.      Intervention Category Major Interventions: Other:  Lysle Dingwall 01/21/2018, 8:31 PM

## 2018-01-21 NOTE — Progress Notes (Signed)
Nutrition Brief Note  RD consulted for tube feeding initiation.   In anticipation of TF beginning today, TF orders were already placed yesterday afternoon.    No nutrition interventions warranted at this time. If nutrition issues arise, please consult RD.   Burtis Junes RD, LDN, CNSC Clinical Nutrition Available Tues-Sat via Pager: [phone] 01/21/2018 10:55 AM

## 2018-01-21 NOTE — Progress Notes (Signed)
LTM discontinued. Dr Doree Albee notified. No skin break down was seen.

## 2018-01-21 NOTE — Progress Notes (Signed)
Subjective: No sz overnight  Exam: Vitals:   01/21/18 1000 01/21/18 1015  BP: (!) 135/57 (!) 107/52  Pulse:  83  Resp: 15 17  Temp: 99.3 F (37.4 C) 99.5 F (37.5 C)  SpO2:  100%   Gen: In bed, intubated Resp: ventilated Abd: soft, nt  Neuro: MS: does not follow commands, opens eyes.  CN: PERRL, eyes more conjugate, but looks upwards.  Motor: withdraws from noxious stimuli Sensory:as above.   Pertinent Labs: Cr 0.6  Impression: 68 yo F with breakthrough seizures in the setting of enterocolitis and inability to take meds.  I suspect a lot of her exam continues to be sedation,   Recommendations: 1) continue vimpat and keppra.  2) continue gabapentin.  3) wean fentanyl 4) CT head 5) neurology will follow  Roland Rack, MD [facility] [phone]  If 7pm- 7am, please page neurology on call as listed in [place]. 01/21/2018  11:03 AM

## 2018-01-22 ENCOUNTER — Inpatient Hospital Stay (HOSPITAL_COMMUNITY): Payer: Medicare PPO

## 2018-01-22 DIAGNOSIS — J9600 Acute respiratory failure, unspecified whether with hypoxia or hypercapnia: Secondary | ICD-10-CM

## 2018-01-22 DIAGNOSIS — E876 Hypokalemia: Secondary | ICD-10-CM

## 2018-01-22 LAB — BLOOD GAS, ARTERIAL
Acid-Base Excess: 2.6 mmol/L — ABNORMAL HIGH (ref 0.0–2.0)
Bicarbonate: 27.2 mmol/L (ref 20.0–28.0)
DRAWN BY: 51155
FIO2: 40
LHR: 16 {breaths}/min
MECHVT: 420 mL
O2 SAT: 99.1 %
PCO2 ART: 45.9 mmHg (ref 32.0–48.0)
PEEP: 5 cmH2O
PH ART: 7.39 (ref 7.350–7.450)
Patient temperature: 98.6
pO2, Arterial: 153 mmHg — ABNORMAL HIGH (ref 83.0–108.0)

## 2018-01-22 LAB — CBC
HEMATOCRIT: 38.8 % (ref 36.0–46.0)
Hemoglobin: 12 g/dL (ref 12.0–15.0)
MCH: 29 pg (ref 26.0–34.0)
MCHC: 30.9 g/dL (ref 30.0–36.0)
MCV: 93.7 fL (ref 78.0–100.0)
Platelets: 234 10*3/uL (ref 150–400)
RBC: 4.14 MIL/uL (ref 3.87–5.11)
RDW: 18.7 % — AB (ref 11.5–15.5)
WBC: 13.3 10*3/uL — ABNORMAL HIGH (ref 4.0–10.5)

## 2018-01-22 LAB — PHOSPHORUS: PHOSPHORUS: 3.8 mg/dL (ref 2.5–4.6)

## 2018-01-22 LAB — BASIC METABOLIC PANEL
Anion gap: 9 (ref 5–15)
BUN: 7 mg/dL — ABNORMAL LOW (ref 8–23)
CALCIUM: 9.4 mg/dL (ref 8.9–10.3)
CO2: 27 mmol/L (ref 22–32)
CREATININE: 0.6 mg/dL (ref 0.44–1.00)
Chloride: 104 mmol/L (ref 98–111)
GFR calc Af Amer: >60 mL/min (ref >60)
GFR calc non Af Amer: >60 mL/min (ref >60)
Glucose, Bld: 158 mg/dL — ABNORMAL HIGH (ref 70–99)
Potassium: 4 mmol/L (ref 3.5–5.1)
SODIUM: 140 mmol/L (ref 135–145)

## 2018-01-22 LAB — GLUCOSE, CAPILLARY
GLUCOSE-CAPILLARY: 118 mg/dL — AB (ref 70–99)
GLUCOSE-CAPILLARY: 126 mg/dL — AB (ref 70–99)
GLUCOSE-CAPILLARY: 104 mg/dL — AB (ref 70–99)
Glucose-Capillary: 137 mg/dL — ABNORMAL HIGH (ref 70–99)
Glucose-Capillary: 131 mg/dL — ABNORMAL HIGH (ref 70–99)
Glucose-Capillary: 100 mg/dL — ABNORMAL HIGH (ref 70–99)

## 2018-01-22 LAB — MAGNESIUM: Magnesium: 1.3 mg/dL — ABNORMAL LOW (ref 1.7–2.4)

## 2018-01-22 MED ORDER — ORAL CARE MOUTH RINSE
15.0000 mL | Freq: Two times a day (BID) | OROMUCOSAL | Status: DC
  Administered 2018-01-22 – 2018-01-23 (×2): 15 mL via OROMUCOSAL

## 2018-01-22 MED ORDER — FENTANYL CITRATE (PF) 100 MCG/2ML IJ SOLN: 25.0000 ug | INTRAMUSCULAR | Status: DC | PRN

## 2018-01-22 MED ORDER — LEVETIRACETAM IN NACL 1000 MG/100ML IV SOLN
1000.0000 mg | Freq: Two times a day (BID) | INTRAVENOUS | Status: DC
  Administered 2018-01-22 – 2018-01-24 (×4): 1000 mg via INTRAVENOUS
  Filled 2018-01-22 (×5): qty 100

## 2018-01-22 MED ORDER — ACETAMINOPHEN 325 MG PO TABS: 650.0000 mg | ORAL_TABLET | Freq: Four times a day (QID) | ORAL | Status: DC | PRN

## 2018-01-22 MED ORDER — ACETAMINOPHEN 650 MG RE SUPP
650.0000 mg | Freq: Four times a day (QID) | RECTAL | Status: DC | PRN
  Administered 2018-01-22: 650 mg via RECTAL
  Filled 2018-01-22: qty 1

## 2018-01-22 MED ORDER — MAGNESIUM SULFATE 2 GM/50ML IV SOLN
2.0000 g | Freq: Once | INTRAVENOUS | Status: AC
  Administered 2018-01-22: 2 g via INTRAVENOUS
  Filled 2018-01-22: qty 50

## 2018-01-22 MED ORDER — IPRATROPIUM-ALBUTEROL 0.5-2.5 (3) MG/3ML IN SOLN
3.0000 mL | Freq: Three times a day (TID) | RESPIRATORY_TRACT | Status: DC
  Administered 2018-01-22 – 2018-01-24 (×6): 3 mL via RESPIRATORY_TRACT
  Filled 2018-01-22 (×6): qty 3

## 2018-01-22 MED ORDER — FUROSEMIDE 10 MG/ML IJ SOLN
20.0000 mg | Freq: Four times a day (QID) | INTRAMUSCULAR | Status: AC
  Administered 2018-01-22 (×3): 20 mg via INTRAVENOUS
  Filled 2018-01-22 (×3): qty 2

## 2018-01-22 NOTE — Progress Notes (Signed)
TF and Fentanyl d/ced. Pt extubated at 42, 2L [place] applied. Pt able to state name and provide good cough on command. Will continue to monitor pt.  25 mL Fentanyl wasted in sink with Retail banker

## 2018-01-22 NOTE — Procedures (Signed)
Extubation Procedure Note  Patient Details:   Name: TOKIKO DIEFENDERFER DOB: 20-Sep-1950 MRN: [id number]   Airway Documentation: audible cuff leak noted prior to extubation, good strong productive cough.  Tolerated well.   Vent end date: 01/22/18 Vent end time: 1018   Evaluation  O2 sats: stable throughout Complications: No apparent complications Patient did tolerate procedure well. Bilateral Breath Sounds: Clear   Yes pt able to say her name and her husband's name  Ned Grace 01/22/2018, 10:25 AM

## 2018-01-22 NOTE — Progress Notes (Signed)
PULMONARY / CRITICAL CARE MEDICINE   Name: Tasha Clarke MRN: [id number] DOB: 1951-04-10    ADMISSION DATE:  01/17/2018 CONSULTATION DATE:  01/19/2017  REFERRING MD:  Dr. Roderic Palau  CHIEF COMPLAINT:  Nausea, Vomiting, and Status Epilepticus   HISTORY OF PRESENT ILLNESS:   HPI obtained from medical chart review and per husband at bedside, as patient is currently intubated and sedated on mechanical ventilation.   67 year old female with past medical history significant for COPD on home O2 2L baseline, ongoing tobacco abuse, seizure disorder, chronic pain syndrome, GERD, DM, HTN, and CKD admitted to [facility] after 2-day history of nausea, vomiting, and diarrhea.    Last seizure reportedly Oct 2017 per husband. She was unable to take her medications, including keppra, for several days 2-3.  She was treated supportively for nausea and vomiting.  GI panel pending.  Subsequently, she had multiple seizures treated with Ativan.  She was started on IV Keppra.  Neurology was consulted on 7/10, with EEG showing single episode of right temporal slowing.  IV Vimpat was added.  CT head negative. Patient continued to have multiple seizures therefore she was electively intubated today for airway protection for concern of status epilepticus.  She has been afebrile and hemodynamically stable.  PCCM accepted transfer as patient will require continuous EEG monitoring, neurology to consult.  SUBJECTIVE:  No events overnight, no new complaints, asking for ETT to be removed  VITAL SIGNS: BP (!) 103/54 (BP Location: Left Arm)   Pulse 99   Temp 99.3 F (37.4 C) (Bladder)   Resp 16   Ht 5\' 3"  (1.6 m)   Wt 198 lb 3.1 oz (89.9 kg)   SpO2 100%   BMI 35.11 kg/m   HEMODYNAMICS:    VENTILATOR SETTINGS: Vent Mode: PRVC FiO2 (%):  [40 %] 40 % Set Rate:  [16 bmp] 16 bmp Vt Set:  [420 mL] 420 mL PEEP:  [5 cmH20] 5 cmH20 Plateau Pressure:  [7 cmH20-9 cmH20] 9 cmH20  INTAKE / OUTPUT: I/O last 3  completed shifts: In: 7047.1 [I.V.:2371.3; NG/GT:1150; IV Piggyback:3525.9] Out: 8990 [Urine:8990]  PHYSICAL EXAMINATION: General: Chronically ill appearing female HEENT: ETT in place, [place]/AT, PERRL, EOM-I and MMM Neuro: Alert and interactive, moving all ext to command CV: RRR, Nl S1/S2 and -M/R/G PULM: CTA B GI: Soft, NT, ND and +BS Extremities: -edema and -tenderness Skin: No rash  LABS:  BMET Recent Labs  Lab 01/21/18 0646 01/21/18 1656 01/22/18 0439  NA 139 136 140  K 4.3 3.9 4.0  CL 110 105 104  CO2 22 23 27   BUN <5* 5* 7*  CREATININE 0.50 0.50 0.60  GLUCOSE 142* 139* 158*   Electrolytes Recent Labs  Lab 01/20/18 0504 01/20/18 2051 01/21/18 0646 01/21/18 1656 01/22/18 0439  CALCIUM 7.6* 6.2* 8.3* 8.6* 9.4  MG 1.6* 0.9* 1.9  --  1.3*  PHOS 2.4*  --   --   --  3.8   CBC Recent Labs  Lab 01/20/18 0504 01/21/18 0646 01/22/18 0439  WBC 9.4 10.2 13.3*  HGB 11.3* 11.0* 12.0  HCT 39.3 37.1 38.8  PLT 253 189 234   Coag's No results for input(s): APTT, INR in the last 168 hours.  Sepsis Markers No results for input(s): LATICACIDVEN, PROCALCITON, O2SATVEN in the last 168 hours.  ABG Recent Labs  Lab 01/19/18 1410 01/22/18 0313  PHART 7.395 7.390  PCO2ART 38.1 45.9  PO2ART 442* 153*   Liver Enzymes Recent Labs  Lab 01/17/18 0736  01/20/18 0504  AST 23 30  ALT 15 14  ALKPHOS 92 60  BILITOT 0.6 0.5  ALBUMIN 3.4* 2.7*  2.7*   Cardiac Enzymes No results for input(s): TROPONINI, PROBNP in the last 168 hours.  Glucose Recent Labs  Lab 01/21/18 1243 01/21/18 1612 01/21/18 1926 01/21/18 2351 01/22/18 0416 01/22/18 0750  GLUCAP 121* 138* 104* 117* 137* 131*   Imaging Ct Head Wo Contrast  Result Date: 01/21/2018 CLINICAL DATA:  Seizures and altered mental status. EXAM: CT HEAD WITHOUT CONTRAST TECHNIQUE: Contiguous axial images were obtained from the base of the skull through the vertex without intravenous contrast. COMPARISON:  01/18/2018  FINDINGS: Brain: Mild generalized atrophy. No evidence of old or acute focal infarction, mass lesion, hemorrhage, hydrocephalus or extra-axial collection. Vascular: There is atherosclerotic calcification of the major vessels at the base of the brain. Skull: Negative Sinuses/Orbits: Small amount of fluid in the left maxillary sinus. Orbits negative. Other: None IMPRESSION: No acute or significant finding.  No change since 3 days ago. Electronically Signed   By: Nelson Chimes M.D.   On: 01/21/2018 18:41   Dg Chest Port 1 View  Result Date: 01/22/2018 CLINICAL DATA:  Endotracheal tube position EXAM: PORTABLE CHEST 1 VIEW COMPARISON:  01/21/2018 FINDINGS: Endotracheal tube 15 mm above the carina unchanged. Right arm PICC tip in the mid SVC unchanged. NG tube in the stomach. Slightly improved lung volume with mild improvement in bibasilar atelectasis. Negative for edema or effusion IMPRESSION: Improved lung volume with improvement in mild bibasilar atelectasis. Endotracheal tube 15 mm above the carina, unchanged. Electronically Signed   By: Franchot Gallo M.D.   On: 01/22/2018 08:52   STUDIES:  7/9 CT abd/pelvis >> 1. Gastric wall thickening along the posterior aspect of the fundus and cardia with potential polypoid extension into the gastric lumen. Underlying gastric mass cannot be excluded. There also may be component of a small diverticulum versus area of ulceration along the posterior wall of the proximal stomach. Correlation with endoscopy may be helpful if there are any symptoms suggestive of gastric inflammation or pathology. 2. Possible enteritis involving the small bowel with scattered fluid throughout the small bowel. 3. 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  7/10 CTH >> 1.  Normal for age non contrast CT appearance of the brain. 2. Partially visible bubbly opacity in the left maxillary sinus, consider sinusitis.  7/10 EEG >> 1. Single episode of  right temporal slowing is observed. This can be associated with epileptic focus. There is no evidence of continues electrographic seizures. 2. Excessive spindles are observed. Undoubtedly, this is due to benzodiazepine.  CULTURES: 7/9 MRSA PCR >> neg 7/11 GI panel >>  ANTIBIOTICS: n/a  SIGNIFICANT EVENTS: 7/9 Admit to [facility] 7/11 Intubated/ tx to [facility]  LINES/TUBES: 7/9 DL R PICC >> 7/11 ETT >> 7/11 OGT >> 7/11 Foley >>  DISCUSSION: 64 yoF with seizure hx presenting with several days of N/V/D, unable to take meds with breakthrough seizures.  Intubated on 7/11 for airway protection in concern for SE and transfer to [facility] for LTM.  ASSESSMENT / PLAN:  PULMONARY A: Respiratory insufficiency in the setting of SE COPD Tobacco abuse  P:   Extubate IS per RT protocol SLP Titrate O2 for sat of 88-92% Diureses again today, lower dose KVO IVF Stopped Breo Restarted DuoNeb  CARDIOVASCULAR A:  Hypotension Hx HTN, HLD - prior TTE 2/19 with normal EF/ diastolic dysfunction P:  Home metoprolol, losartan are on hold Aspirin  on hold D/C neo Continue atorvastatin  RENAL A:   No acute issues P:   KVO IVF Lasix 20 mg IV q6 x3 doses K and Mg replacement Follow BMP, urine output Replace electrolytes as indicated  GASTROINTESTINAL A:   Acute gastroenteritis  Concern for gastric mass - prior chest CT 10/2017 concerning for esophagitis and small hiatial hernia - no BM 7/10, last BM 7/11 P:   NPO for now SLP Will need a GI evaluation to discuss possible gastric mass once stabilized from this neurological condition  HEMATOLOGIC A:   Chronic normocytic anemia- stable P:  Enoxaparin Follow CBC intermittently  INFECTIOUS A:   Gastroenteritis  At risk for aspiration Currently afebrile, normal WBC P:   Continue enteric precautions GI panel pending- just sent today due to no stools Monitor clinically Trend WBC/ fever curve   ENDOCRINE A:    DM Hypothyroidism P:   Continue Synthroid Follow CBG, initiate sliding scale insulin  NEUROLOGIC A:   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  Note restart gabapentin, other antiepileptics being adjusted. D/C propofol D/C fentanyl drip PRN fentanyl  FAMILY  - Updates: No family bedside 7/13  - Inter-disciplinary family meet or Palliative Care meeting due by:  7/18  The patient is critically ill with multiple organ systems failure and requires high complexity decision making for assessment and support, frequent evaluation and titration of therapies, application of advanced monitoring technologies and extensive interpretation of multiple databases.   Critical Care Time devoted to patient care services described in this note is  34  Minutes. This time reflects time of care of this signee Dr Jennet Maduro. This critical care time does not reflect procedure time, or teaching time or supervisory time of PA/NP/Med student/Med Resident etc but could involve care discussion time.  Rush Farmer, M.D. [facility] Pulmonary/Critical Care Medicine. Pager: [phone]. After hours pager: [phone]

## 2018-01-22 NOTE — Progress Notes (Signed)
Sent a tracheal aspirate sample to lab for analysis.

## 2018-01-22 NOTE — Progress Notes (Signed)
Neo off to draw labs

## 2018-01-22 NOTE — Progress Notes (Signed)
Subjective: More awake but still not reliably following commands.   Exam: Vitals:   01/22/18 0800 01/22/18 0850  BP: (!) 103/54   Pulse: 92 99  Resp: 15 16  Temp: 99.3 F (37.4 C)   SpO2: 100% 100%   Gen: In bed, intubated Resp: ventilated Abd: soft, nt  Neuro: MS: does not follow commands, but does appear to gesture/answer question swith nods/shakes.  She is agitated and not very cooperative with exam.  CN: PERRL, eyes conjugate, crosses midline in both directions.  Motor: withdraws from noxious stimuli Sensory:as above.   Pertinent Labs: BMP - unremarkable  CT hea d- no acute findings.   Impression: 67 yo F with breakthrough seizures in the setting of enterocolitis and inability to take meds. She had a seizure flurry requiring intubation at [facility], but once her EEG was connected after transfer here she was seizure free.   2 days of EEG from 07/11 to 07/13 with no seizure recorded. I suspect a lot of her exam continues to be sedation, and she appears to be improving.   She was well controled on her home regimen of gabapentin 600mg  QID and keppra 1g QHS. I would favor returning to this regimen once able to reliably follow exam.    Recommendations: 1) continue vimpat 200mg  Q12H(woudl d/c once extubated)  2) continue keppra 1 g BID 2) continue gabapentin 600mg  QID 3) wean fentanyl 4) neurology will follow  Roland Rack, MD [facility] [phone]  If 7pm- 7am, please page neurology on call as listed in [place]. 01/22/2018  10:02 AM

## 2018-01-22 NOTE — Progress Notes (Signed)
SLP Cancellation Note  Patient Details Name: Tasha Clarke MRN: [id number] DOB: 1951-02-07   Cancelled treatment:       Reason Eval/Treat Not Completed: Patient not medically ready. Checked in with pt and RN at bedside, not appropriate for swallow eval yet. Will f/u tomorrow.    Deari Sessler, Katherene Ponto 01/22/2018, 2:32 PM

## 2018-01-23 LAB — GLUCOSE, CAPILLARY
GLUCOSE-CAPILLARY: 113 mg/dL — AB (ref 70–99)
GLUCOSE-CAPILLARY: 83 mg/dL (ref 70–99)
Glucose-Capillary: 106 mg/dL — ABNORMAL HIGH (ref 70–99)
Glucose-Capillary: 126 mg/dL — ABNORMAL HIGH (ref 70–99)
Glucose-Capillary: 90 mg/dL (ref 70–99)

## 2018-01-23 LAB — BASIC METABOLIC PANEL
Anion gap: 11 (ref 5–15)
BUN: 8 mg/dL (ref 8–23)
CALCIUM: 9.3 mg/dL (ref 8.9–10.3)
CO2: 33 mmol/L — ABNORMAL HIGH (ref 22–32)
Chloride: 96 mmol/L — ABNORMAL LOW (ref 98–111)
Creatinine, Ser: 0.64 mg/dL (ref 0.44–1.00)
GFR calc Af Amer: >60 mL/min (ref >60)
GLUCOSE: 111 mg/dL — AB (ref 70–99)
Potassium: 3.6 mmol/L (ref 3.5–5.1)
Sodium: 140 mmol/L (ref 135–145)

## 2018-01-23 LAB — CBC
HEMATOCRIT: 38.7 % (ref 36.0–46.0)
HEMOGLOBIN: 12 g/dL (ref 12.0–15.0)
MCH: 28.5 pg (ref 26.0–34.0)
MCHC: 31 g/dL (ref 30.0–36.0)
MCV: 91.9 fL (ref 78.0–100.0)
Platelets: 232 10*3/uL (ref 150–400)
RBC: 4.21 MIL/uL (ref 3.87–5.11)
RDW: 17.8 % — ABNORMAL HIGH (ref 11.5–15.5)
WBC: 9.6 10*3/uL (ref 4.0–10.5)

## 2018-01-23 LAB — MAGNESIUM: MAGNESIUM: 1.1 mg/dL — AB (ref 1.7–2.4)

## 2018-01-23 LAB — PHOSPHORUS: PHOSPHORUS: 4.6 mg/dL (ref 2.5–4.6)

## 2018-01-23 MED ORDER — FUROSEMIDE 10 MG/ML IJ SOLN
20.0000 mg | Freq: Every day | INTRAMUSCULAR | Status: DC
  Administered 2018-01-23 – 2018-01-24 (×2): 20 mg via INTRAVENOUS
  Filled 2018-01-23 (×2): qty 2

## 2018-01-23 NOTE — Addendum Note (Signed)
Addendum  created 01/23/18 0831 by Vista Deck, CRNA   Intraprocedure Staff edited

## 2018-01-23 NOTE — Progress Notes (Addendum)
Subjective: Had a conversation with husband on the phone.  He was here this morning.  Patient is extubated and per him personality wise she is back to her baseline.  She has had no further seizures.  You live in [place] and he would like to find a neurologist in [place] to go to for follow-up   Exam: Vitals:   01/23/18 0825 01/23/18 0900  BP:  96/77  Pulse:  92  Resp:  14  Temp:  99.7 F (37.6 C)  SpO2: 99% 99%    Physical Exam   HEENT-  Normocephalic, no lesions, without obvious abnormality.  Normal external eye and conjunctiva.   Extremities- Warm, dry and intact Musculoskeletal-no joint tenderness, deformity or swelling Skin-warm and dry, no hyperpigmentation, vitiligo, or suspicious lesions    Neuro:  Mental Status: Alert, states "hello" and is very jovial however thought content is questionable however husband has "stated this is her baseline".  Distracted and does not follow commands Cranial Nerves: II: ; Visual fields grossly normal,  III,IV, VI: ptosis not present, extra-ocular motions intact bilaterally pupils equal, round, reactive to light and accommodation V,VII: Patient has a slight right facial droop however she does have some edentulous areas, facial light touch sensation normal bilaterally VIII: hearing normal bilaterally In Motor: Withdraws briskly 5/5 from noxious stimuli d Sensory: Withdraws from noxious stimuli  Deep Tendon Reflexes: 2+ and symmetric throughout     Medications:  Scheduled: . atorvastatin  40 mg Per Tube q1800  . Chlorhexidine Gluconate Cloth  6 each Topical Daily  . enoxaparin (LOVENOX) injection  40 mg Subcutaneous Q24H  . furosemide  20 mg Intravenous Daily  . gabapentin  600 mg Per Tube QID  . ipratropium-albuterol  3 mL Nebulization TID  . levothyroxine  50 mcg Per Tube QAC breakfast  . mouth rinse  15 mL Mouth Rinse BID  . sodium chloride flush  10-40 mL Intracatheter Q12H    Pertinent Labs/Diagnostics: None  Ct Head Wo  Contrast  Result Date: 01/21/2018 CLINICAL DATA:  Seizures and altered mental status. EXAM: CT HEAD WITHOUT CONTRAST TECHNIQUE: Contiguous axial images were obtained from the base of the skull through the vertex without intravenous contrast. COMPARISON:  01/18/2018 FINDINGS: Brain: Mild generalized atrophy. No evidence of old or acute focal infarction, mass lesion, hemorrhage, hydrocephalus or extra-axial collection. Vascular: There is atherosclerotic calcification of the major vessels at the base of the brain. Skull: Negative Sinuses/Orbits: Small amount of fluid in the left maxillary sinus. Orbits negative. Other: None IMPRESSION: No acute or significant finding.  No change since 3 days ago. Electronically Signed   By: Nelson Chimes M.D.   On: 01/21/2018 18:41   Dg Chest Port 1 View  Result Date: 01/22/2018 CLINICAL DATA:  Endotracheal tube position EXAM: PORTABLE CHEST 1 VIEW COMPARISON:  01/21/2018 FINDINGS: Endotracheal tube 15 mm above the carina unchanged. Right arm PICC tip in the mid SVC unchanged. NG tube in the stomach. Slightly improved lung volume with mild improvement in bibasilar atelectasis. Negative for edema or effusion IMPRESSION: Improved lung volume with improvement in mild bibasilar atelectasis. Endotracheal tube 15 mm above the carina, unchanged. Electronically Signed   By: Franchot Gallo M.D.   On: 01/22/2018 08:52     Etta Quill PA-C [facility] [phone]   Assessment: There is a 67 year old female with breakthrough seizures in the setting of enterococcus and inability to take meds.  Although she had a flurry of seizures at any pain when EEG was hooked up  she was seizure-free.  At this point she is back to her baseline per husband and extubated   Recommendations: -Keep on her home dose of Keppra and Neurontin -Titrate down Vimpat to 100 mg tomorrow, at 50 mg the next day then off - Husband states they live in near [place] and would like to see a  neurologist in that area. -Per [facility] statutes, patients with seizures are not allowed to drive until  they have been seizure-free for six months. Use caution when using heavy equipment or power tools. Avoid working on ladders or at heights. Take showers instead of baths. Ensure the water temperature is not too high on the home water heater. Do not go swimming alone. When caring for infants or small children, sit down when holding, feeding, or changing them to minimize risk of injury to the child in the event you have a seizure.    At this time neurology will sign off.  Please call with any questions    01/23/2018, 11:31 AM Attending Neurohospitalist Addendum Patient seen and examined with APP/Resident. Agree with the history and physical as documented above. Agree with the plan as documented, which I helped formulate. I have independently reviewed the chart, obtained history, review of systems and examined the patient.I have personally reviewed pertinent head/neck/spine imaging (CT/MRI). Please feel free to call with any questions. --- Amie Portland, MD [facility] Pager: [phone]  If 7pm to 7am, please call on call as listed on [facility].

## 2018-01-23 NOTE — Progress Notes (Signed)
Placed patient on CPAP via FFm, auto settings (max 18.0, min 5.0) cm H20 with 2 LPM O2 bleed in. Tolerating well at this time. RN aware.

## 2018-01-23 NOTE — Evaluation (Signed)
Physical Therapy Evaluation Patient Details Name: Tasha Clarke MRN: [id number] DOB: 1950-12-15 Today's Date: 01/23/2018   History of Present Illness  67 year old female with past medical history significant for COPD on home O2 2L baseline, ongoing tobacco abuse, seizure disorder, chronic pain syndrome, GERD, DM, HTN, and CKD admitted to [facility] after 2-day history of nausea, vomiting, and diarrhea.  Subsequently, she had multiple seizures treated with Ativan. She was intubated 7/11 and extubated 7/14  Clinical Impression  Patient presents with decreased independence with mobility due to weakness, decreased balance, decreased safety awareness, decreased cognition and poor activity tolerance.  She will need SNF level rehab at d/c.  PT to follow acutely to progress mobility and allow maximized safety prior to dc.    Follow Up Recommendations SNF;Supervision/Assistance - 24 hour    Equipment Recommendations  None recommended by PT    Recommendations for Other Services       Precautions / Restrictions Precautions Precautions: Fall Precaution Comments: seizures Restrictions Weight Bearing Restrictions: No      Mobility  Bed Mobility Overal bed mobility: Needs Assistance Bed Mobility: Rolling;Sidelying to Sit;Sit to Supine Rolling: Min assist Sidelying to sit: Max assist   Sit to supine: +2 for safety/equipment;Max assist   General bed mobility comments: assist for legs off bed and to lift trunk; to supine assist for legs into bed and lowering trunk and to scoot to [facility]  Transfers Overall transfer level: Needs assistance Equipment used: 2 person hand held assist Transfers: Sit to/from [facility] Sit to Stand: Mod assist;+2 physical assistance;Max assist Stand pivot transfers: +2 physical assistance;Mod assist;Max assist       General transfer comment: fluctuating in between mod and max A for mobility due to attention level, increased time to  follow commands, highly distractible, etc  Ambulation/Gait                Stairs            Wheelchair Mobility    Modified Rankin (Stroke Patients Only)       Balance Overall balance assessment: Needs assistance   Sitting balance-Leahy Scale: Poor Sitting balance - Comments: leaning forward and backwards, heavily reliant on external support, can sit momentarily with S, but unaware and unconcerned about holding herself up   Standing balance support: Bilateral upper extremity supported Standing balance-Leahy Scale: Poor Standing balance comment: assist for standing turn to w/c for transferring room, but pt not safe so transferred back to bed for transfer to another floor                             Pertinent Vitals/Pain Pain Assessment: Faces Faces Pain Scale: Hurts whole lot Pain Location: back lying supine Pain Descriptors / Indicators: Grimacing;Guarding Pain Intervention(s): Monitored during session;Repositioned    Home Living Family/patient expects to be discharged to:: Private residence Living Arrangements: Spouse/significant other Available Help at Discharge: Available PRN/intermittently;Family Type of Home: House Home Access: Ramped entrance     Home Layout: One level Home Equipment: Wheelchair - manual;Bedside commode;Shower seat;Walker - 4 wheels Additional Comments: information taken from prior admission 2 months ago due to pt confuses and no family present    Prior Function Level of Independence: Independent with assistive device(s)         Comments: ambulates short household distances with Rollator, uses wheelchair mostly, does not walk mostly due to fear of falling, O2 dependent  Hand Dominance        Extremity/Trunk Assessment   Upper Extremity Assessment Upper Extremity Assessment: Generalized weakness    Lower Extremity Assessment Lower Extremity Assessment: Generalized weakness(difficult to assess due to  cognition, able to stand, but weakly and with assist)       Communication      Cognition Arousal/Alertness: Lethargic Behavior During Therapy: Restless Overall Cognitive Status: No family/caregiver present to determine baseline cognitive functioning Area of Impairment: Orientation;Attention;Following commands;Problem solving                 Orientation Level: Disoriented to;Place;Time;Situation Current Attention Level: Focused   Following Commands: Follows one step commands inconsistently;Follows one step commands with increased time     Problem Solving: Slow processing;Decreased initiation;Difficulty sequencing;Requires verbal cues;Requires tactile cues        General Comments      Exercises     Assessment/Plan    PT Assessment Patient needs continued PT services  PT Problem List Decreased strength;Decreased safety awareness;Decreased mobility;Decreased range of motion;Decreased knowledge of precautions;Decreased coordination       PT Treatment Interventions DME instruction;Therapeutic activities;Cognitive remediation;Gait training;Therapeutic exercise;Patient/family education;Balance training;Functional mobility training;Neuromuscular re-education    PT Goals (Current goals can be found in the Care Plan section)  Acute Rehab PT Goals Patient Stated Goal: none stated PT Goal Formulation: Patient unable to participate in goal setting Time For Goal Achievement: 02/06/18 Potential to Achieve Goals: Good    Frequency Min 3X/week   Barriers to discharge        Co-evaluation               AM-PAC PT "6 Clicks" Daily Activity  Outcome Measure Difficulty turning over in bed (including adjusting bedclothes, sheets and blankets)?: Unable Difficulty moving from lying on back to sitting on the side of the bed? : Unable Difficulty sitting down on and standing up from a chair with arms (e.g., wheelchair, bedside commode, etc,.)?: Unable Help needed moving to and  from a bed to chair (including a wheelchair)?: Total Help needed walking in hospital room?: Total Help needed climbing 3-5 steps with a railing? : Total 6 Click Score: 6    End of Session Equipment Utilized During Treatment: Gait belt   Patient left: in bed;with nursing/sitter in room   PT Visit Diagnosis: Other abnormalities of gait and mobility (R26.89)    Time: 1406-1430 PT Time Calculation (min) (ACUTE ONLY): 24 min   Charges:   PT Evaluation $PT Eval Moderate Complexity: 1 Mod PT Treatments $Therapeutic Activity: 8-22 mins   PT G CodesMagda Kiel, [place] [phone] 01/23/2018   Reginia Naas 01/23/2018, 3:21 PM

## 2018-01-23 NOTE — Progress Notes (Signed)
SLP Cancellation Note  Patient Details Name: Tasha Clarke MRN: [id number] DOB: 1950/08/08   Cancelled treatment:       Reason Eval/Treat Not Completed: Patient not medically ready. Attempted swallow eval, pt with striderous breathing intermittently. Would not accept PO. Suspect anxiety component.    Burch Marchuk, Katherene Ponto 01/23/2018, 9:12 AM

## 2018-01-23 NOTE — Plan of Care (Signed)
67 yo female admitted with status epilepticus intubated for airway protection now extubated and seizure free more than 24 hours.TRH pick up 7/16.

## 2018-01-23 NOTE — Progress Notes (Signed)
Patient transferred from 4N. Placed on tele monitor and purwick replaced. Vitals stable at this time. Neuro status the same as reported from 4N RN. Will continue to monitor.

## 2018-01-23 NOTE — Progress Notes (Signed)
Patient is currently NPO, Speech therapy cancelled this morning due to patient condition. RN has attempted to call acute therapy number and left messages for a call back. On call speech therapy pager twice. Will keep patient NPO per order until re-evaluation.

## 2018-01-23 NOTE — Progress Notes (Signed)
Nutrition Follow-up  DOCUMENTATION CODES:   Obesity unspecified  INTERVENTION:   Monitor pt's ability to swallow and supplement diet as appropriate   NUTRITION DIAGNOSIS:   Inadequate oral intake related to inability to eat as evidenced by NPO status. Ongoing.   GOAL:   Patient will meet greater than or equal to 90% of their needs Not met.   MONITOR:   Diet advancement, PO intake, Weight trends  ASSESSMENT:   Pt with PMH of CHF, COPD, DM, GERD, Depression, seizures and anemia admitted with several days of N/V/D unable to take seizure medications having breakthrough seizures. Pt with newly identified gastric mass. Intubated for airway protection.   7/14 pt extubated 7/15 unable to participate in swallow eval due to striderous breathing per SLP Per husband pt is back to baseline  Weight trending down with lasix  Medications reviewed and include: lasix  Labs reviewed: Magnesium 1.1 (L)   Diet Order:   Diet Order           Diet NPO time specified  Diet effective now          EDUCATION NEEDS:   No education needs have been identified at this time  Skin:  Skin Assessment: Reviewed RN Assessment  Last BM:  7/15 small  Height:   Ht Readings from Last 1 Encounters:  01/19/18 '5\' 3"'  (1.6 m)    Weight:   Wt Readings from Last 1 Encounters:  01/23/18 187 lb 6.3 oz (85 kg)    Ideal Body Weight:  52.2 kg  BMI:  Body mass index is 33.19 kg/m.  Estimated Nutritional Needs:   Kcal:  1400-1700  Protein:  85-100 grams  Fluid:  > 1.5 L/day  Maylon Peppers RD, LDN, CNSC [phone] Pager [phone] After Hours Pager

## 2018-01-23 NOTE — Progress Notes (Addendum)
PULMONARY / CRITICAL CARE MEDICINE   Name: Tasha Clarke MRN: [id number] DOB: 08-08-1950    ADMISSION DATE:  01/17/2018 CONSULTATION DATE:  01/19/2017  REFERRING MD:  Dr. Roderic Palau  CHIEF COMPLAINT:  Nausea, Vomiting, and Status Epilepticus   HISTORY OF PRESENT ILLNESS:   HPI obtained from medical chart review and per husband at bedside, as patient is currently intubated and sedated on mechanical ventilation.   67 year old female with past medical history significant for COPD on home O2 2L baseline, ongoing tobacco abuse, seizure disorder, chronic pain syndrome, GERD, DM, HTN, and CKD admitted to [facility] after 2-day history of nausea, vomiting, and diarrhea.    Last seizure reportedly Oct 2017 per husband. She was unable to take her medications, including keppra, for several days 2-3.  She was treated supportively for nausea and vomiting.  GI panel pending.  Subsequently, she had multiple seizures treated with Ativan.  She was started on IV Keppra.  Neurology was consulted on 7/10, with EEG showing single episode of right temporal slowing.  IV Vimpat was added.  CT head negative. Patient continued to have multiple seizures therefore she was electively intubated today for airway protection for concern of status epilepticus.  She has been afebrile and hemodynamically stable.  PCCM accepted transfer as patient will require continuous EEG monitoring, neurology to consult.  SUBJECTIVE:  Extubated on 7/14. Interactive but disoriented. No clinical seizures overnight. 3 liquid stools overnight. No nausea or vomiting.  VITAL SIGNS: BP (!) 136/115   Pulse 95   Temp 99.5 F (37.5 C)   Resp 13   Ht 5\' 3"  (1.6 m)   Wt 187 lb 6.3 oz (85 kg)   SpO2 92%   BMI 33.19 kg/m   HEMODYNAMICS:    VENTILATOR SETTINGS: Vent Mode: PRVC FiO2 (%):  [40 %] 40 % Set Rate:  [16 bmp] 16 bmp Vt Set:  [420 mL] 420 mL PEEP:  [5 cmH20] 5 cmH20  INTAKE / OUTPUT: I/O last 3 completed shifts: In:  2536.9 [I.V.:859.1; FWYOV:785; NG/GT:808.3; IV Piggyback:644.5] Out: 6350 [Urine:6350]  PHYSICAL EXAMINATION: General: Chronically ill-appearing elderly female, extubated and in no distress.  Neuro: Very soft voice but interactive.  Oriented x1.  Pupils equal, EOMs appear to be full and face is symmetric.  Moving all fours on request.    S1 and S2 are regular without murmur rub or gallop  PULM: Respirations are unlabored, there is no stridor, there is symmetric air movement and no wheezes.   GI: The abdomen is soft without organomegaly masses or tenderness.   Skin: Diffusely dry flaky skin   LABS:  BMET Recent Labs  Lab 01/21/18 1656 01/22/18 0439 01/23/18 0543  NA 136 140 140  K 3.9 4.0 3.6  CL 105 104 96*  CO2 23 27 33*  BUN 5* 7* 8  CREATININE 0.50 0.60 0.64  GLUCOSE 139* 158* 111*   Electrolytes Recent Labs  Lab 01/20/18 0504  01/21/18 0646 01/21/18 1656 01/22/18 0439 01/23/18 0543  CALCIUM 7.6*   < > 8.3* 8.6* 9.4 9.3  MG 1.6*   < > 1.9  --  1.3* 1.1*  PHOS 2.4*  --   --   --  3.8 4.6   < > = values in this interval not displayed.   CBC Recent Labs  Lab 01/21/18 0646 01/22/18 0439 01/23/18 0543  WBC 10.2 13.3* 9.6  HGB 11.0* 12.0 12.0  HCT 37.1 38.8 38.7  PLT 189 234 232   Coag's  No results for input(s): APTT, INR in the last 168 hours.  Sepsis Markers No results for input(s): LATICACIDVEN, PROCALCITON, O2SATVEN in the last 168 hours.  ABG Recent Labs  Lab 01/19/18 1410 01/22/18 0313  PHART 7.395 7.390  PCO2ART 38.1 45.9  PO2ART 442* 153*   Liver Enzymes Recent Labs  Lab 01/17/18 0736 01/20/18 0504  AST 23 30  ALT 15 14  ALKPHOS 92 60  BILITOT 0.6 0.5  ALBUMIN 3.4* 2.7*  2.7*   Cardiac Enzymes No results for input(s): TROPONINI, PROBNP in the last 168 hours.  Glucose Recent Labs  Lab 01/22/18 0750 01/22/18 1201 01/22/18 1550 01/22/18 1919 01/22/18 2323 01/23/18 0332  GLUCAP 131* 118* 126* 104* 100* 106*   Imaging No  results found. STUDIES:  7/9 CT abd/pelvis >> 1. Gastric wall thickening along the posterior aspect of the fundus and cardia with potential polypoid extension into the gastric lumen. Underlying gastric mass cannot be excluded. There also may be component of a small diverticulum versus area of ulceration along the posterior wall of the proximal stomach. Correlation with endoscopy may be helpful if there are any symptoms suggestive of gastric inflammation or pathology. 2. Possible enteritis involving the small bowel with scattered fluid throughout the small bowel. 3. 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  7/10 CTH >> 1.  Normal for age non contrast CT appearance of the brain. 2. Partially visible bubbly opacity in the left maxillary sinus, consider sinusitis.  7/10 EEG >> 1. Single episode of right temporal slowing is observed. This can be associated with epileptic focus. There is no evidence of continues electrographic seizures. 2. Excessive spindles are observed. Undoubtedly, this is due to benzodiazepine.  CULTURES: 7/9 MRSA PCR >> neg 7/11 GI panel >>  ANTIBIOTICS: n/a  SIGNIFICANT EVENTS: 7/9 Admit to [facility] 7/11 Intubated/ tx to [facility]  LINES/TUBES: 7/9 DL R PICC >> 7/11 ETT >> 7/11 OGT >> 7/11 Foley >>  DISCUSSION: 4 yoF with seizure hx presenting with several days of N/V/D, unable to take meds with breakthrough seizures.  Intubated on 7/11 for airway protection in concern for SE and transfer to [facility] for LTM.  ASSESSMENT / PLAN:  PULMONARY A: Respiratory insufficiency in the setting of SE COPD. O2 dependent at home Tobacco abuse  P:   Extubation has been well tolerated IS per RT protocol  Titrate O2 for sat of 88-92% Continue diuresis Continue Duonebs  CARDIOVASCULAR A:  Hypotension Hx HTN, HLD - prior TTE 2/19 with normal EF/ diastolic dysfunction P:  Home metoprolol, losartan are on hold Resume scheduled  diuretic Continue atorvastatin  RENAL A:   No acute issues P:   KVO IVF Lasix 20 mg IV q6 x3 doses K and Mg replacement Follow BMP, urine output Replace electrolytes as indicated  GASTROINTESTINAL A:   Acute gastroenteritis  Concern for gastric mass - prior chest CT 10/2017 concerning for esophagitis and small hiatial hernia - no BM 7/10, last BM 7/11 P:   NPO for now SLP Will need a GI evaluation to discuss possible gastric mass once stabilized from this neurological condition  HEMATOLOGIC A:   Chronic normocytic anemia- stable P:  Enoxaparin Follow CBC intermittently  INFECTIOUS A:   Gastroenteritis  At risk for aspiration Currently afebrile, normal WBC P:   Continue enteric precautions GI panel not back. Monitor clinically Trend WBC/ fever curve   ENDOCRINE A:   DM Hypothyroidism P:   Continue Synthroid Follow CBG, initiate sliding scale insulin  NEUROLOGIC A:   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  Note restart gabapentin, other antiepileptics being adjusted.  Medically stable for transfer out of ICU   Lars Masson, MD Critical Care Medicine. Pager: [phone]. After hours pager: [phone]

## 2018-01-24 DIAGNOSIS — J9600 Acute respiratory failure, unspecified whether with hypoxia or hypercapnia: Secondary | ICD-10-CM

## 2018-01-24 DIAGNOSIS — I10 Essential (primary) hypertension: Secondary | ICD-10-CM

## 2018-01-24 LAB — BASIC METABOLIC PANEL
ANION GAP: 15 (ref 5–15)
BUN: 14 mg/dL (ref 8–23)
CHLORIDE: 97 mmol/L — AB (ref 98–111)
CO2: 29 mmol/L (ref 22–32)
Calcium: 9.2 mg/dL (ref 8.9–10.3)
Creatinine, Ser: 0.69 mg/dL (ref 0.44–1.00)
GFR calc Af Amer: >60 mL/min (ref >60)
GLUCOSE: 94 mg/dL (ref 70–99)
POTASSIUM: 3.7 mmol/L (ref 3.5–5.1)
SODIUM: 141 mmol/L (ref 135–145)

## 2018-01-24 LAB — GLUCOSE, CAPILLARY
GLUCOSE-CAPILLARY: 109 mg/dL — AB (ref 70–99)
Glucose-Capillary: 107 mg/dL — ABNORMAL HIGH (ref 70–99)

## 2018-01-24 LAB — MAGNESIUM: MAGNESIUM: 1.2 mg/dL — AB (ref 1.7–2.4)

## 2018-01-24 LAB — CULTURE, RESPIRATORY W GRAM STAIN

## 2018-01-24 LAB — CULTURE, RESPIRATORY

## 2018-01-24 MED ORDER — DOXYCYCLINE HYCLATE 100 MG PO TABS: 100.0000 mg | ORAL_TABLET | Freq: Two times a day (BID) | ORAL | Status: DC

## 2018-01-24 MED ORDER — SODIUM CHLORIDE 0.9 % IV SOLN
100.0000 mg | Freq: Two times a day (BID) | INTRAVENOUS | Status: DC
  Filled 2018-01-24: qty 10

## 2018-01-24 MED ORDER — DOXYCYCLINE HYCLATE 100 MG PO TABS: 100.0000 mg | ORAL_TABLET | Freq: Two times a day (BID) | ORAL | 0 refills | Status: DC

## 2018-01-24 MED ORDER — IPRATROPIUM-ALBUTEROL 0.5-2.5 (3) MG/3ML IN SOLN: 3.0000 mL | Freq: Four times a day (QID) | RESPIRATORY_TRACT | Status: DC | PRN

## 2018-01-24 MED ORDER — LACOSAMIDE 50 MG PO TABS: ORAL_TABLET | ORAL | 0 refills | Status: DC

## 2018-01-24 NOTE — Care Management Important Message (Signed)
Important Message  Patient Details  Name: Tasha Clarke MRN: [id number] Date of Birth: 1951/06/02   Medicare Important Message Given:  Yes    Mouhamadou Gittleman Montine Circle 01/24/2018, 2:07 PM

## 2018-01-24 NOTE — Evaluation (Signed)
Clinical/Bedside Swallow Evaluation Patient Details  Name: Tasha Clarke MRN: [id number] Date of Birth: 02-28-1951  Today's Date: 01/24/2018 Time: SLP Start Time (ACUTE ONLY): 0910 SLP Stop Time (ACUTE ONLY): 0925 SLP Time Calculation (min) (ACUTE ONLY): 15 min  Past Medical History:  Past Medical History:  Diagnosis Date  . Allergy   . Anemia   . Anxiety   . Arthritis   . Asthma   . Blood transfusion without reported diagnosis   . Cataract   . CHF (congestive heart failure) ([place])   . Chronic kidney disease   . Clotting disorder ([place])   . COPD (chronic obstructive pulmonary disease) ([place])   . Depression   . Diabetes mellitus without complication ([place])   . Emphysema of lung ([place])   . GERD (gastroesophageal reflux disease)   . Hyperlipidemia   . Hypertension   . Neuromuscular disorder ([place])   . Osteoporosis   . Oxygen deficiency   . Seizures ([place])   . Skin cancer    2 areas removed more than 10 years ago  . Sleep apnea 1985   wears cpap nightly  . Thyroid disease    Past Surgical History:  Past Surgical History:  Procedure Laterality Date  . APPENDECTOMY    . EYE SURGERY    . FRACTURE SURGERY    . heart stents    . JOINT REPLACEMENT    . SPINAL CORD STIMULATOR IMPLANT  2015  . SPINE SURGERY     HPI:  13 yoF with seizure hx presenting with several days of N/V/D, unable to take meds with breakthrough seizures.  Intubated on 7/11 for airway protection in concern for SE and transfer to [facility] for LTM.   Assessment / Plan / Recommendation Clinical Impression  Pt consumed thin liquids and soft solids without signs of aspiration. Breathing pattern now WNL (was abnormal two days following extubation). Pt now attentive and participatory, able to self feed. Vocal quality is hoarse, pt reports some dysphonia at baseline, but husband not present to confirm. Pt is edentulous with a history of esophageal stricture. Will initiate a dys 3 (mech soft) diet with thin liquids. No SLP  fu needed.  SLP Visit Diagnosis: Dysphagia, unspecified (R13.10)    Aspiration Risk  Mild aspiration risk    Diet Recommendation Dysphagia 3 (Mech soft);Thin liquid   Liquid Administration via: Cup;Straw Medication Administration: Whole meds with liquid Supervision: Patient able to self feed Compensations: Slow rate;Small sips/bites Postural Changes: Seated upright at 90 degrees    Other  Recommendations Oral Care Recommendations: Oral care BID   Follow up Recommendations None      Frequency and Duration            Prognosis        Swallow Study   General HPI: 87 yoF with seizure hx presenting with several days of N/V/D, unable to take meds with breakthrough seizures.  Intubated on 7/11 for airway protection in concern for SE and transfer to [facility] for LTM. Type of Study: Bedside Swallow Evaluation Previous Swallow Assessment: none Diet Prior to this Study: NPO Temperature Spikes Noted: No Respiratory Status: Nasal cannula History of Recent Intubation: Yes Length of Intubations (days): 4 days Date extubated: 01/22/18 Behavior/Cognition: Alert;Cooperative;Pleasant mood Oral Cavity Assessment: Within Functional Limits Oral Care Completed by SLP: No Oral Cavity - Dentition: Edentulous Vision: Functional for self-feeding Self-Feeding Abilities: Able to feed self Patient Positioning: Upright in bed Baseline Vocal Quality: Hoarse;Breathy Volitional Cough: Strong Volitional Swallow: Able to elicit  Oral/Motor/Sensory Function Overall Oral Motor/Sensory Function: Within functional limits   Ice Chips Ice chips: Not tested   Thin Liquid Thin Liquid: Within functional limits Presentation: Cup;Straw    Nectar Thick Nectar Thick Liquid: Not tested   Honey Thick Honey Thick Liquid: Not tested   Puree Puree: Within functional limits Presentation: Spoon;Self Fed   Solid   GO            Obediah Welles, Katherene Ponto 01/24/2018,9:26 AM

## 2018-01-24 NOTE — Care Management Note (Signed)
Case Management Note  Patient Details  Name: Tasha Clarke MRN: [id number] Date of Birth: [birth year]  Subjective/Objective:     Admitted with 2-day history of nausea, vomiting, and diarrhea, seizures. Hx of  COPD on home O2 2L baseline, ongoing tobacco abuse, seizure disorder, chronic pain syndrome, GERD, DM, HTN, and CKD. Home oxygen dependent. From home with husband.       Charlotte Crumb (Spouse) Elaf Clauson (Daughter)      [phone]      Action/Plan: PT's recommendations: SNF;Supervision/Assistance - 24 hour. Pt declines SNF placement. Adamant about going home, states agreeable to home health services. NCM provided choice... Pt selected [facility]. Referral made with [facility].    Husband states he/family/ friends  to provide 24/7 supervision for pt once discharges.  Husband to provide transportation to home.    Expected Discharge Date:   01/24/2018               Expected Discharge Plan:  [place]  In-House Referral:  Clinical Social Work  Discharge planning Services  CM Consult  Post Acute Care Choice:    Choice offered to:  Patient  DME Arranged:   N/A DME Agency:   N/A  HH Arranged:  RN, PT, Nurse's Aide, Social Work [employer] Agency:  [employer], pending MD's orders. NCM has requested order from MD.  Status of Service:  COMPLETED  If discussed at Long Length of Stay Meetings, dates discussed:    Additional Comments:  Sharin Mons, RN 01/24/2018, 11:31 AM

## 2018-01-24 NOTE — Discharge Summary (Signed)
Physician Discharge Summary  EUPHA LOBB PRF:[id number] DOB: 02/26/1951 DOA: 01/17/2018  PCP: Dettinger, Fransisca Kaufmann, MD  Admit date: 01/17/2018 Discharge date: 01/24/2018  Admitted From: Home Disposition: Home   Recommendations for Outpatient Follow-up:  1. Follow up with PCP as scheduled on Friday this week. 2. Referral to GI recommended for evaluation of possible gastric mass seen on CT abd 7/9.  3. Referral to neurology recommended for seizure disorder.  Home Health: Patient and husband declined SNF and home health. Equipment/Devices: 2L O2 to continue Discharge Condition: Stable CODE STATUS: Full Diet recommendation: Heart healthy, carb-modified  Brief/Interim Summary: Tasha Clarke is a 67 year old female with past medical history significant for COPD on home O2 2L baseline, ongoing tobacco abuse, seizure disorder, chronic pain syndrome, GERD, DM, HTN, and CKD admitted to [facility] after 2-day history of nausea, vomiting, and diarrhea.    Last seizure reportedly Oct 2017 per husband. She was unable to take her medications, including keppra, for several days. She was treated supportively for nausea and vomiting. GI pathogen panel ultimately negative. Subsequently, she had multiple seizures treated with Ativan.  She was started on IV Keppra.  Neurology was consulted on 7/10, with EEG showing single episode of right temporal slowing.  IV Vimpat was added.  CT head negative. Patient continued to have multiple seizures therefore she was electively intubated for airway protection for concern of status epilepticus on 7/11, transferred to [facility] ICU for continuous EEG monitoring. EEG monitoring did not reveal ongoing seizures and she was ultimately extubated 7/14 and returned to her mental baseline. Neurology has made recommendations regarding antiepileptic medications including restarting her home dose keppra and neurontin and tapering off vimpat over the next few days as below. Seizure  precautions including driving restrictions were discussed with the patient by neurology.  Discharge Diagnoses:  Active Problems:   COPD (chronic obstructive pulmonary disease) (HCC)   Chronic pain syndrome   Hypothyroid   Hypertension   Hypokalemia   Seizure (HCC)   Gastroenteritis   Acute respiratory insufficiency   Acute respiratory failure (HCC)  Seizure disorder, status epilepticus:   - Per neurology: continue home dose of Keppra and Neurontin, and titrate down Vimpat to 100 mg po BID 7/16 > 72m po BID 7/17 > then off.  - Needs to establish care with neurology in [place]   Dysphagia following extubation: Mild aspiration risk. Seen by SLP with recommendations made and reviewed with the patient. No SLP follow up was recommended.   MRSA in tracheal aspirate: Was febrile with leukocytosis but currently asymptomatic.  - Discussed with patient and husband. If the patient develops symptoms, she will need to start doxycycline (not allergic to this in the past). 5 days on doxycycline per susceptibility data was sent to their pharmacy in this case.  Acute gastroenteritis: Resolved.   Gastric wall thickening: Noted on CT abd/pelvis 7/9. GYork Pellantnot ruled out.  - Follow up with GI as outpatient. Consider endoscopy vs. repeat imaging as outpatient. This was discussed with the patient and husband on the day of discharge.   HTN, HLD, DM, hypothyroidism, chronic pain: Continue home medications  Discharge Instructions Discharge Instructions    Diet - low sodium heart healthy   Complete by:  As directed    Discharge instructions   Complete by:  As directed    You were admitted for gastroenteritis and developed uncontrolled seizures. You have stabilized, now tolerating a diet and no longer having seizures. It is recommended that you  go to a skilled nursing facility for rehabilitation prior to discharging to your home, though you have declined this option. Therefore, you are stable for  discharge home with home health services with the following recommendations:  - Continue taking home medications as you were, specifically including keppra and neurontin. - You were also treated with another seizure medication called VIMPAT. You don't need to take this longterm but you will need to pick up a prescription for a short taper off this medication that you can complete over the next 2 days. - A culture of your respiratory tract showed evidence of MRSA, so you will need to take doxycycline for 5 total days to complete treatment of this.  - You need to follow up with a neurologist in your area. Please follow up with your PCP as soon as possible to discuss this referral.  - On a CT scan of your abdomen on July 9th an area of thickening in the stomach was noticed. This may have been due to inflammation, though a mass cannot be ruled out. It is recommended that you follow up with a GI specialist for this and to consider an endoscopic evaluation.  - If you develop seizure-like activity or are unable to tolerate medications and/or fluids by mouth, seek medical attention right away.   Increase activity slowly   Complete by:  As directed      Allergies as of 01/24/2018      Reactions   Cephalexin Hives   Ketorolac Tromethamine Hives   Lac Bovis Nausea And Vomiting   Imdur  [isosorbide Dinitrate]    Iodinated Diagnostic Agents    Other Other (See Comments)   Alka seltzer causes blurred vision, fainting   Sodium Bicarbonate-citric Acid    Milk-related Compounds Nausea And Vomiting      Medication List    TAKE these medications   alendronate 70 MG tablet Commonly known as:  FOSAMAX Take 1 tablet (70 mg total) by mouth every Monday. Take with a full glass of water on an empty stomach.   aspirin EC 81 MG tablet Take 81 mg by mouth daily.   atorvastatin 40 MG tablet Commonly known as:  LIPITOR Take 40 mg by mouth daily.   cetirizine 10 MG tablet Commonly known as:  ZYRTEC Take 1  tablet (10 mg total) by mouth daily.   cyclobenzaprine 10 MG tablet Commonly known as:  FLEXERIL Take 10 mg by mouth 2 (two) times daily as needed for muscle spasms.   doxycycline 100 MG tablet Commonly known as:  VIBRA-TABS Take 1 tablet (100 mg total) by mouth every 12 (twelve) hours.   DULoxetine 30 MG capsule Commonly known as:  CYMBALTA Take 30 mg by mouth daily.   EMBEDA 30-1.2 MG Cpcr Generic drug:  Morphine-Naltrexone Take 1 capsule by mouth 2 (two) times daily.   EPINEPHrine 0.3 mg/0.3 mL Soaj injection Commonly known as:  EPI-PEN Inject 0.3 mg into the muscle as needed.   Fish Oil 1000 MG Caps Take 1,000 mg by mouth daily.   fluticasone 50 MCG/ACT nasal spray Commonly known as:  FLONASE Place 2 sprays into both nostrils daily.   fluticasone furoate-vilanterol 200-25 MCG/INH Aepb Commonly known as:  BREO ELLIPTA Inhale 1 puff into the lungs daily.   Fluticasone-Salmeterol 250-50 MCG/DOSE Aepb Commonly known as:  ADVAIR Inhale 1 puff into the lungs 2 (two) times daily.   gabapentin 600 MG tablet Commonly known as:  NEURONTIN Take 1 tablet (600 mg total) by mouth 4 (  four) times daily.   ipratropium-albuterol 0.5-2.5 (3) MG/3ML Soln Commonly known as:  DUONEB Take 3 mLs by nebulization every 6 (six) hours as needed (shortness of breathe).   Iron 325 (65 Fe) MG Tabs Take 1 tablet (325 mg total) by mouth daily.   lacosamide 50 MG Tabs tablet Commonly known as:  VIMPAT Take 2 tablets (100 mg total) by mouth 2 (two) times daily for 1 day, THEN 1 tablet (50 mg total) 2 (two) times daily for 1 day. Start taking on:  01/25/2018   levETIRAcetam 1000 MG tablet Commonly known as:  KEPPRA TAKE 1 TABLET BY MOUTH AT BEDTIME   levothyroxine 50 MCG tablet Commonly known as:  SYNTHROID, LEVOTHROID Take 50 mcg by mouth daily before breakfast.   losartan 100 MG tablet Commonly known as:  COZAAR TAKE 1 2 (ONE HALF) TABLET BY MOUTH ONCE DAILY   Magnesium 400 MG  Caps Take 400 mg by mouth daily.   metoprolol tartrate 25 MG tablet Commonly known as:  LOPRESSOR Take 25 mg by mouth 2 (two) times daily.   multivitamin tablet Take 1 tablet by mouth daily.   NARCAN 4 MG/0.1ML Liqd nasal spray kit Generic drug:  naloxone Place into the nose.   PROVENTIL HFA 108 (90 Base) MCG/ACT inhaler Generic drug:  albuterol Inhale into the lungs.   TYLENOL PM EXTRA STRENGTH 25-500 MG Tabs tablet Generic drug:  diphenhydramine-acetaminophen Take 1 tablet by mouth at bedtime as needed (pain).   Vitamin D-3 5000 units Tabs Take 5,000 Units by mouth daily.            Durable Medical Equipment  (From admission, onward)        Start     Ordered   01/24/18 1234  For home use only DME oxygen  Once    Question Answer Comment  Mode or (Route) Nasal cannula   Liters per Minute 2   Frequency Continuous (stationary and portable oxygen unit needed)   Oxygen delivery system Gas      01/24/18 1233     Follow-up Information    [facility] Follow up.   Specialty:  Home Health Services Why:  home health services arranged Contact information: [address] [place] [address] [phone]        Dettinger, Fransisca Kaufmann, MD. Schedule an appointment as soon as possible for a visit in 1 week(s).   Specialties:  Family Medicine, Cardiology Contact information: [address] [place] [address] [phone]          Allergies  Allergen Reactions  . Cephalexin Hives  . Ketorolac Tromethamine Hives  . Lac Bovis Nausea And Vomiting  . Imdur  [Isosorbide Dinitrate]   . Iodinated Diagnostic Agents   . Other Other (See Comments)    Alka seltzer causes blurred vision, fainting  . Sodium Bicarbonate-Citric Acid   . Milk-Related Compounds Nausea And Vomiting    Consultations:  Neurology  PCCM  Procedures/Studies: Ct Abdomen Pelvis Wo Contrast  Result Date: 01/17/2018 CLINICAL DATA:  Vomiting, diarrhea and body  aches. EXAM: CT ABDOMEN AND PELVIS WITHOUT CONTRAST TECHNIQUE: Multidetector CT imaging of the abdomen and pelvis was performed following the standard protocol without IV contrast. COMPARISON:  None. FINDINGS: Lower chest: No acute abnormality. Hepatobiliary: No focal liver abnormality is seen. Status post cholecystectomy. No biliary dilatation. Pancreas: The pancreas appears atrophic. No obvious mass or inflammation by unenhanced CT. Spleen: Normal in size without focal abnormality. Adrenals/Urinary Tract: Nodularity along the superior  aspect of the right adrenal gland measures approximately 13 mm and demonstrates low internal density of 4 Hounsfield units. This most likely corresponds to a benign adenoma. The left adrenal gland appears unremarkable. The kidneys show no evidence of hydronephrosis. There is a tiny nonobstructing calculus within the medial aspect of the mid right kidney measuring approximately 2 mm. The bladder is unremarkable. Stomach/Bowel: The stomach is partially distended with fluid. Along the posterior wall of the fundus, there is some potential nodular thickening with polypoid extension into the lumen. A mass cannot be excluded. This region of thickening measures roughly 3 cm in estimated diameter. Posterior aspect of the gastric fundus and cardia also demonstrates an area of focal outpouching containing a gas bubble which could represent a small diverticulum versus area of ulceration. No evidence to suggest gastric perforation or obstruction. There is some fluid scattered throughout the small bowel which is nonspecific but may be consistent with enteritis. No evidence of small bowel obstruction. The colon is decompressed. No free air or focal abscess identified. Vascular/Lymphatic: No significant vascular findings are present. No enlarged abdominal or pelvic lymph nodes. Reproductive: Status post hysterectomy. No adnexal masses. Other: Spinal stimulator generator is positioned within the  subcutaneous tissues of the left trans lumbar region with electrodes extending into the lower thoracic canal. Musculoskeletal: No acute or significant osseous findings. IMPRESSION: 1. Gastric wall thickening along the posterior aspect of the fundus and cardia with potential polypoid extension into the gastric lumen. Underlying gastric mass cannot be excluded. There also may be component of a small diverticulum versus area of ulceration along the posterior wall of the proximal stomach. Correlation with endoscopy may be helpful if there are any symptoms suggestive of gastric inflammation or pathology. 2. Possible enteritis involving the small bowel with scattered fluid throughout the small bowel. 3. 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 Electronically Signed   By: Aletta Edouard M.D.   On: 01/17/2018 10:31   Ct Head Wo Contrast  Result Date: 01/21/2018 CLINICAL DATA:  Seizures and altered mental status. EXAM: CT HEAD WITHOUT CONTRAST TECHNIQUE: Contiguous axial images were obtained from the base of the skull through the vertex without intravenous contrast. COMPARISON:  01/18/2018 FINDINGS: Brain: Mild generalized atrophy. No evidence of old or acute focal infarction, mass lesion, hemorrhage, hydrocephalus or extra-axial collection. Vascular: There is atherosclerotic calcification of the major vessels at the base of the brain. Skull: Negative Sinuses/Orbits: Small amount of fluid in the left maxillary sinus. Orbits negative. Other: None IMPRESSION: No acute or significant finding.  No change since 3 days ago. Electronically Signed   By: Nelson Chimes M.D.   On: 01/21/2018 18:41   Ct Head Wo Contrast  Result Date: 01/18/2018 CLINICAL DATA:  67 year old female with vomiting, diarrhea. Recent seizure activity, history of seizures. EXAM: CT HEAD WITHOUT CONTRAST TECHNIQUE: Contiguous axial images were obtained from the base of the skull through the vertex without intravenous  contrast. COMPARISON:  None. FINDINGS: Brain: Mild motion artifact. Cerebral volume is within normal limits for age. No midline shift, ventriculomegaly, mass effect, evidence of mass lesion, intracranial hemorrhage or evidence of cortically based acute infarction. Gray-white matter differentiation is within normal limits throughout the brain. Vascular: Calcified atherosclerosis at the skull base. No suspicious intracranial vascular hyperdensity. Skull: Osteopenia.  No acute osseous abnormality identified. Sinuses/Orbits: Low-density bubbly opacity in the visible left maxillary sinus. Other paranasal sinuses are clear (incidental osteomas in the right frontal and ethmoid sinuses). Tympanic cavities and mastoids  are clear. Other: Visualized scalp soft tissues are within normal limits. Negative visible orbits soft tissues. IMPRESSION: 1.  Normal for age non contrast CT appearance of the brain. 2. Partially visible bubbly opacity in the left maxillary sinus, consider sinusitis. Electronically Signed   By: Genevie Ann M.D.   On: 01/18/2018 12:25   Dg Chest Port 1 View  Result Date: 01/22/2018 CLINICAL DATA:  Endotracheal tube position EXAM: PORTABLE CHEST 1 VIEW COMPARISON:  01/21/2018 FINDINGS: Endotracheal tube 15 mm above the carina unchanged. Right arm PICC tip in the mid SVC unchanged. NG tube in the stomach. Slightly improved lung volume with mild improvement in bibasilar atelectasis. Negative for edema or effusion IMPRESSION: Improved lung volume with improvement in mild bibasilar atelectasis. Endotracheal tube 15 mm above the carina, unchanged. Electronically Signed   By: Franchot Gallo M.D.   On: 01/22/2018 08:52   Dg Chest Port 1 View  Result Date: 01/21/2018 CLINICAL DATA:  Acute respiratory failure EXAM: PORTABLE CHEST 1 VIEW COMPARISON:  01/20/2018 FINDINGS: Endotracheal tube remains in good position. NG tube in the stomach. Right arm PICC tip in the mid SVC. Cardiac enlargement without heart failure or  pneumonia. Mild bibasilar atelectasis. No effusion. Spinal cord stimulator noted. IMPRESSION: Support lines remain in good position. Mild bibasilar atelectasis unchanged from the prior study. Electronically Signed   By: Franchot Gallo M.D.   On: 01/21/2018 08:49   Dg Chest Port 1 View  Result Date: 01/20/2018 CLINICAL DATA:  ETT/VENT, HX N/V/D FOR SEVERAL DAYS.,CHF,COPD,HTN.ASTHMA,CKD EXAM: PORTABLE CHEST 1 VIEW COMPARISON:  Chest x-rays dated 01/19/2018 and 10/28/2017. FINDINGS: Stable cardiomegaly. Overall cardiomediastinal silhouette is stable. Endotracheal tube appears adequately positioned with tip approximately 4 cm above the carina. Enteric tube passes below the diaphragm. RIGHT-sided PICC line appears adequately positioned with tip at the level of the mid SVC. Stimulator hardware within the midline of the thoracic spine. Lungs are clear. No pneumothorax or significant pleural effusion is seen. No acute or suspicious osseous finding. Atherosclerotic changes again noted at the aortic arch. IMPRESSION: 1. No active disease. No evidence of pneumonia or pulmonary edema on today's exam. 2. Support apparatus appears adequately positioned. 3. Stable cardiomegaly. 4. Aortic atherosclerosis. Electronically Signed   By: Franki Cabot M.D.   On: 01/20/2018 08:44   Dg Chest Port 1 View  Result Date: 01/19/2018 CLINICAL DATA:  67 y/o  F; ET tube placement. EXAM: PORTABLE CHEST 1 VIEW COMPARISON:  01/19/2018 chest radiograph. FINDINGS: Endotracheal tube tip projects 6.5 cm above carina. Enteric tube tip is below the field of view in the abdomen. Right PICC line tip projects over mid SVC. Stable cardiomegaly and aortic atherosclerosis. Mild interval decrease in pulmonary vascular congestion. No new consolidation, effusion, or pneumothorax. Bones are unremarkable. Partially visualized anterior cervical fusion hardware noted. IMPRESSION: 1. Endotracheal tube tip projects 6.5 cm above carina. 2. Enteric tube tip  extends below field of view into abdomen. 3. Decreased pulmonary vascular congestion.  Stable cardiomegaly. Electronically Signed   By: Kristine Garbe M.D.   On: 01/19/2018 17:20   Portable Chest X-ray  Result Date: 01/19/2018 CLINICAL DATA:  Status post endotracheal tube placement EXAM: PORTABLE CHEST 1 VIEW COMPARISON:  CT chest 10/28/2017 FINDINGS: Right-sided PICC line with the tip projecting over the cavoatrial junction. Endotracheal tube with the tip 6 cm above the carina. Nasogastric tube coursing below the diaphragm. Diffuse bilateral interstitial thickening. No pleural effusion or pneumothorax. Stable cardiomegaly. No aggressive osseous lesion. IMPRESSION: 1. Right-sided PICC line with the  tip projecting over the cavoatrial junction. 2. Endotracheal tube with the tip 6 cm above the carina. 3. Nasogastric tube coursing below the diaphragm. 4. Findings concerning for mild CHF. Electronically Signed   By: Kathreen Devoid   On: 01/19/2018 11:48    STUDIES:  7/9 CT abd/pelvis >> 1. Gastric wall thickening along the posterior aspect of the fundus and cardia with potential polypoid extension into the gastric lumen. Underlying gastric mass cannot be excluded. There also may be component of a small diverticulum versus area of ulceration along the posterior wall of the proximal stomach. Correlation with endoscopy may be helpful if there are any symptoms suggestive of gastric inflammation or pathology. 2. Possible enteritis involving the small bowel with scattered fluid throughout the small bowel. 3. 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  7/10 CTH >> 1. Normal for age non contrast CT appearance of the brain. 2. Partially visible bubbly opacity in the left maxillary sinus, consider sinusitis.  7/10 EEG >> 1. Single episode of right temporal slowing is observed. This can be associated with epileptic focus. There is no evidence of continues  electrographic seizures. 2. Excessive spindles are observed. Undoubtedly, this is due to benzodiazepine.  CULTURES: 7/9 MRSA PCR >> neg 7/11 GI panel >> neg 7/14 Tracheal aspirate >> MRSA  ANTIBIOTICS: n/a  SIGNIFICANT EVENTS: 7/9 Admit to [facility] 7/11 Intubated/ tx to [facility]  LINES/TUBES: 7/9 DL R PICC >> 7/11 ETT >> 7/11 OGT >> 7/11 Foley >>  Subjective: Feels well. Wants to go home. No chest pain, dyspnea, cough, abd pain, N/V/D, cleared for a diet, not having a problem with this. No fevers.   Discharge Exam: Vitals:   01/24/18 0615 01/24/18 0838  BP: 115/68   Pulse: 90   Resp: 18   Temp: 98.8 F (37.1 C)   SpO2: 95% 96%   General: Pt is alert, awake, not in acute distress Cardiovascular: RRR, S1/S2 +, no rubs, no gallops Respiratory: Clear, nonlabored on home supplemental oxygen Abdominal: Soft, NT, ND, bowel sounds + Extremities: No edema, no cyanosis  Labs: BNP (last 3 results) Recent Labs    08/25/17 1020 10/28/17 1657  BNP 202.3* 97.6   Basic Metabolic Panel: Recent Labs  Lab 01/20/18 0504 01/20/18 2051 01/21/18 0646 01/21/18 1656 01/22/18 0439 01/23/18 0543 01/24/18 0350  NA 143 142 139 136 140 140 141  K 4.5 3.1* 4.3 3.9 4.0 3.6 3.7  CL 111 121* 110 105 104 96* 97*  CO2 23 18* '22 23 27 ' 33* 29  GLUCOSE 86 429* 142* 139* 158* 111* 94  BUN <5* <5* <5* 5* 7* 8 14  CREATININE 0.63 0.53 0.50 0.50 0.60 0.64 0.69  CALCIUM 7.6* 6.2* 8.3* 8.6* 9.4 9.3 9.2  MG 1.6* 0.9* 1.9  --  1.3* 1.1* 1.2*  PHOS 2.4*  --   --   --  3.8 4.6  --    Liver Function Tests: Recent Labs  Lab 01/20/18 0504  AST 30  ALT 14  ALKPHOS 60  BILITOT 0.5  PROT 5.6*  ALBUMIN 2.7*  2.7*   CBC: Recent Labs  Lab 01/19/18 0403 01/20/18 0504 01/21/18 0646 01/22/18 0439 01/23/18 0543  WBC 8.2 9.4 10.2 13.3* 9.6  HGB 12.1 11.3* 11.0* 12.0 12.0  HCT 39.3 39.3 37.1 38.8 38.7  MCV 91.8 98.3 95.6 93.7 91.9  PLT 245 253 189 234 232   CBG: Recent Labs  Lab  01/23/18 1221 01/23/18 1604 01/23/18 2126 01/24/18 0740 01/24/18  [place] 90 107* 109*   Urinalysis    Component Value Date/Time   COLORURINE YELLOW 01/17/2018 [place] 01/17/2018 1144   LABSPEC 1.014 01/17/2018 1144   PHURINE 6.0 01/17/2018 1144   GLUCOSEU NEGATIVE 01/17/2018 1144   HGBUR NEGATIVE 01/17/2018 1144   BILIRUBINUR NEGATIVE 01/17/2018 [place] 01/17/2018 1144   PROTEINUR 30 (A) 01/17/2018 1144   NITRITE NEGATIVE 01/17/2018 [place] 01/17/2018 1144    Microbiology Recent Results (from the past 240 hour(s))  MRSA PCR Screening     Status: None   Collection Time: 01/17/18  4:49 PM  Result Value Ref Range Status   MRSA by PCR NEGATIVE NEGATIVE Final    Comment:        The GeneXpert MRSA Assay (FDA approved for NASAL specimens only), is one component of a comprehensive MRSA colonization surveillance program. It is not intended to diagnose MRSA infection nor to guide or monitor treatment for MRSA infections. Performed at [facility], [address] [place], [place] [address]   Gastrointestinal Panel by PCR , Stool     Status: None   Collection Time: 01/19/18  3:00 PM  Result Value Ref Range Status   Campylobacter species NOT DETECTED NOT DETECTED Final   Plesimonas shigelloides NOT DETECTED NOT DETECTED Final   Salmonella species NOT DETECTED NOT DETECTED Final   Yersinia enterocolitica NOT DETECTED NOT DETECTED Final   Vibrio species NOT DETECTED NOT DETECTED Final   Vibrio cholerae NOT DETECTED NOT DETECTED Final   Enteroaggregative E coli (EAEC) NOT DETECTED NOT DETECTED Final   Enteropathogenic E coli (EPEC) NOT DETECTED NOT DETECTED Final   Enterotoxigenic E coli (ETEC) NOT DETECTED NOT DETECTED Final   Shiga like toxin producing E coli (STEC) NOT DETECTED NOT DETECTED Final   Shigella/Enteroinvasive E coli (EIEC) NOT DETECTED NOT DETECTED Final   Cryptosporidium NOT DETECTED NOT  DETECTED Final   Cyclospora cayetanensis NOT DETECTED NOT DETECTED Final   Entamoeba histolytica NOT DETECTED NOT DETECTED Final   Giardia lamblia NOT DETECTED NOT DETECTED Final   Adenovirus F40/41 NOT DETECTED NOT DETECTED Final   Astrovirus NOT DETECTED NOT DETECTED Final   Norovirus GI/GII NOT DETECTED NOT DETECTED Final   Rotavirus A NOT DETECTED NOT DETECTED Final   Sapovirus (I, II, IV, and V) NOT DETECTED NOT DETECTED Final    Comment: Performed at [facility], [address] [place], [place] [address]  Culture, blood (routine x 2)     Status: None (Preliminary result)   Collection Time: 01/21/18  5:40 PM  Result Value Ref Range Status   Specimen Description BLOOD RIGHT HAND  Final   Special Requests   Final    BOTTLES DRAWN AEROBIC AND ANAEROBIC Blood Culture adequate volume   Culture   Final    NO GROWTH 2 DAYS Performed at [facility], [address] [place], [place] [address]    Report Status PENDING  Incomplete  Culture, blood (routine x 2)     Status: None (Preliminary result)   Collection Time: 01/21/18  5:41 PM  Result Value Ref Range Status   Specimen Description BLOOD RIGHT HAND  Final   Special Requests   Final    BOTTLES DRAWN AEROBIC AND ANAEROBIC Blood Culture adequate volume   Culture   Final    NO GROWTH 2 DAYS Performed at [facility], [place] [address], [place], [place] [address]  Report Status PENDING  Incomplete  Culture, respiratory (NON-Expectorated)     Status: None   Collection Time: 01/22/18 10:08 AM  Result Value Ref Range Status   Specimen Description TRACHEAL ASPIRATE  Final   Special Requests NONE  Final   Gram Stain   Final    ABUNDANT WBC PRESENT, PREDOMINANTLY PMN ABUNDANT GRAM POSITIVE COCCI Performed at [facility], [address] [place], [place] [address]    Culture   Final    MODERATE METHICILLIN RESISTANT STAPHYLOCOCCUS AUREUS   Report Status 01/24/2018 FINAL  Final   Organism ID, Bacteria  METHICILLIN RESISTANT STAPHYLOCOCCUS AUREUS  Final      Susceptibility   Methicillin resistant staphylococcus aureus - MIC*    CIPROFLOXACIN >=8 RESISTANT Resistant     ERYTHROMYCIN >=8 RESISTANT Resistant     GENTAMICIN <=0.5 SENSITIVE Sensitive     OXACILLIN >=4 RESISTANT Resistant     TETRACYCLINE 2 SENSITIVE Sensitive     VANCOMYCIN 1 SENSITIVE Sensitive     TRIMETH/SULFA <=10 SENSITIVE Sensitive     CLINDAMYCIN >=8 RESISTANT Resistant     RIFAMPIN <=0.5 SENSITIVE Sensitive     Inducible Clindamycin NEGATIVE Sensitive     * MODERATE METHICILLIN RESISTANT STAPHYLOCOCCUS AUREUS    Time coordinating discharge: Approximately 40 minutes  Patrecia Pour, MD  [facility] 01/24/2018, 1:05 PM Pager [phone]

## 2018-01-24 NOTE — Progress Notes (Signed)
CSW received consult regarding PT recommendation of SNF at discharge.  Patient is refusing SNF and wants to return home. CSW spoke with patient's spouse to confirm. He would also like for patient to return home. He and patient are declining home health but do need assistance with oxygen set up as their previous company is now out of their service area ([employer] [facility]). Patient's spouse will come pick patient up by car at discharge. RNCM aware.  CSW signing off.   Percell Locus Shaw Dobek LCSW [phone]

## 2018-01-25 DIAGNOSIS — I509 Heart failure, unspecified: Secondary | ICD-10-CM | POA: Diagnosis not present

## 2018-01-25 DIAGNOSIS — I130 Hypertensive heart and chronic kidney disease with heart failure and stage 1 through stage 4 chronic kidney disease, or unspecified chronic kidney disease: Secondary | ICD-10-CM | POA: Diagnosis not present

## 2018-01-25 DIAGNOSIS — E1122 Type 2 diabetes mellitus with diabetic chronic kidney disease: Secondary | ICD-10-CM | POA: Diagnosis not present

## 2018-01-25 DIAGNOSIS — G894 Chronic pain syndrome: Secondary | ICD-10-CM | POA: Diagnosis not present

## 2018-01-25 DIAGNOSIS — N183 Chronic kidney disease, stage 3 (moderate): Secondary | ICD-10-CM | POA: Diagnosis not present

## 2018-01-25 DIAGNOSIS — R1310 Dysphagia, unspecified: Secondary | ICD-10-CM | POA: Diagnosis not present

## 2018-01-25 DIAGNOSIS — E876 Hypokalemia: Secondary | ICD-10-CM | POA: Diagnosis not present

## 2018-01-25 DIAGNOSIS — J449 Chronic obstructive pulmonary disease, unspecified: Secondary | ICD-10-CM | POA: Diagnosis not present

## 2018-01-25 DIAGNOSIS — G40901 Epilepsy, unspecified, not intractable, with status epilepticus: Secondary | ICD-10-CM | POA: Diagnosis not present

## 2018-01-26 ENCOUNTER — Ambulatory Visit: Payer: Medicare PPO | Admitting: Family Medicine

## 2018-01-26 ENCOUNTER — Ambulatory Visit (INDEPENDENT_AMBULATORY_CARE_PROVIDER_SITE_OTHER): Payer: Medicare PPO

## 2018-01-26 ENCOUNTER — Encounter: Payer: Self-pay | Admitting: Family Medicine

## 2018-01-26 VITALS — BP 108/69 | HR 84 | Temp 99.1°F

## 2018-01-26 DIAGNOSIS — M25532 Pain in left wrist: Secondary | ICD-10-CM

## 2018-01-26 DIAGNOSIS — R790 Abnormal level of blood mineral: Secondary | ICD-10-CM

## 2018-01-26 DIAGNOSIS — G40909 Epilepsy, unspecified, not intractable, without status epilepticus: Secondary | ICD-10-CM | POA: Diagnosis not present

## 2018-01-26 DIAGNOSIS — S6992XA Unspecified injury of left wrist, hand and finger(s), initial encounter: Secondary | ICD-10-CM | POA: Diagnosis not present

## 2018-01-26 DIAGNOSIS — E876 Hypokalemia: Secondary | ICD-10-CM

## 2018-01-26 DIAGNOSIS — J439 Emphysema, unspecified: Secondary | ICD-10-CM | POA: Diagnosis not present

## 2018-01-26 DIAGNOSIS — E785 Hyperlipidemia, unspecified: Secondary | ICD-10-CM

## 2018-01-26 DIAGNOSIS — K3189 Other diseases of stomach and duodenum: Secondary | ICD-10-CM

## 2018-01-26 LAB — CULTURE, BLOOD (ROUTINE X 2)
Culture: NO GROWTH
Culture: NO GROWTH
Special Requests: ADEQUATE
Special Requests: ADEQUATE

## 2018-01-26 MED ORDER — CYCLOBENZAPRINE HCL 10 MG PO TABS: 10.0000 mg | ORAL_TABLET | Freq: Two times a day (BID) | ORAL | 1 refills | Status: DC | PRN

## 2018-01-26 NOTE — Progress Notes (Signed)
BP 108/69   Pulse 84   Temp 99.1 F (37.3 C) (Oral)    Subjective:    Patient ID: Tasha Clarke Nurse, female    DOB: 09-19-1950, 67 y.o.   MRN: [id number]  HPI: Tasha Clarke is a 67 y.o. female presenting on 01/26/2018 for Hospitalization Follow-up   HPI Patient is coming in today for hospital follow-up.  She was in the hospital on 01/17/2018 and patient was discharged on 01/24/2018.  She was in the hospital with nausea and vomiting and then she ended up having seizures because she could not take her oral seizure medication.  Patient's nausea and vomiting had improved but she was also found during the visit to have gastric thickening that they were concerned about a mass.  Patient was restarted on her seizure medication and they recommended a referral to neurology as well.  Since leaving the hospital she has not had any further seizures and the nausea and vomiting has improved.  She is feeling still weak today but better than she had been and is back on her previous medications.  She also states today that she is currently using 2 L of oxygen nasal cannula and needs to transfer to a new advanced home health care company because the previous one is out of the area.  Patient has low potassium from the hospital needs a recheck.  She had a lot of diarrhea and vomiting  On oxygen testing patient was initially 89% on 2 L nasal cannula and once removed and then retested drop down to 82% on room air and then with 2 L nasal cannula and returned back to 88% on 2 L nasal cannula.  Patient is wanting a portable concentrator as well as oxygen tanks.  She still will have her home concentrator as they believe that for her but just needs the portable methods prescribed again with the new company.  Patient had a fall prior to the hospitalization and landed on her left wrist and she still has soreness from her left wrist.  She would like  Relevant past medical, surgical, family and social history reviewed and  updated as indicated. Interim medical history since our last visit reviewed. Allergies and medications reviewed and updated.  Review of Systems  Constitutional: Negative for chills and fever.  Eyes: Negative for visual disturbance.  Respiratory: Positive for cough, shortness of breath and wheezing. Negative for chest tightness.   Cardiovascular: Negative for chest pain and leg swelling.  Musculoskeletal: Negative for back pain and gait problem.  Skin: Negative for rash.  Neurological: Negative for light-headedness and headaches.  Psychiatric/Behavioral: Negative for agitation and behavioral problems.  All other systems reviewed and are negative.   Per HPI unless specifically indicated above   Allergies as of 01/26/2018      Reactions   Cephalexin Hives   Ketorolac Tromethamine Hives   Lac Bovis Nausea And Vomiting   Imdur  [isosorbide Dinitrate]    Iodinated Diagnostic Agents    Other Other (See Comments)   Alka seltzer causes blurred vision, fainting   Sodium Bicarbonate-citric Acid    Milk-related Compounds Nausea And Vomiting      Medication List        Accurate as of 01/26/18  9:24 AM. Always use your most recent med list.          alendronate 70 MG tablet Commonly known as:  FOSAMAX Take 1 tablet (70 mg total) by mouth every Monday. Take with a full glass of water  on an empty stomach.   aspirin EC 81 MG tablet Take 81 mg by mouth daily.   atorvastatin 40 MG tablet Commonly known as:  LIPITOR Take 40 mg by mouth daily.   cetirizine 10 MG tablet Commonly known as:  ZYRTEC Take 1 tablet (10 mg total) by mouth daily.   cyclobenzaprine 10 MG tablet Commonly known as:  FLEXERIL Take 1 tablet (10 mg total) by mouth 2 (two) times daily as needed for muscle spasms.   doxycycline 100 MG tablet Commonly known as:  VIBRA-TABS Take 1 tablet (100 mg total) by mouth every 12 (twelve) hours.   DULoxetine 30 MG capsule Commonly known as:  CYMBALTA Take 30 mg by mouth  daily.   EMBEDA 30-1.2 MG Cpcr Generic drug:  Morphine-Naltrexone Take 1 capsule by mouth 2 (two) times daily.   EPINEPHrine 0.3 mg/0.3 mL Soaj injection Commonly known as:  EPI-PEN Inject 0.3 mg into the muscle as needed.   Fish Oil 1000 MG Caps Take 1,000 mg by mouth daily.   fluticasone 50 MCG/ACT nasal spray Commonly known as:  FLONASE Place 2 sprays into both nostrils daily.   fluticasone furoate-vilanterol 200-25 MCG/INH Aepb Commonly known as:  BREO ELLIPTA Inhale 1 puff into the lungs daily.   Fluticasone-Salmeterol 250-50 MCG/DOSE Aepb Commonly known as:  ADVAIR Inhale 1 puff into the lungs 2 (two) times daily.   gabapentin 600 MG tablet Commonly known as:  NEURONTIN Take 1 tablet (600 mg total) by mouth 4 (four) times daily.   ipratropium-albuterol 0.5-2.5 (3) MG/3ML Soln Commonly known as:  DUONEB Take 3 mLs by nebulization every 6 (six) hours as needed (shortness of breathe).   Iron 325 (65 Fe) MG Tabs Take 1 tablet (325 mg total) by mouth daily.   lacosamide 50 MG Tabs tablet Commonly known as:  VIMPAT Take 2 tablets (100 mg total) by mouth 2 (two) times daily for 1 day, THEN 1 tablet (50 mg total) 2 (two) times daily for 1 day. Start taking on:  01/25/2018   levETIRAcetam 1000 MG tablet Commonly known as:  KEPPRA TAKE 1 TABLET BY MOUTH AT BEDTIME   levothyroxine 50 MCG tablet Commonly known as:  SYNTHROID, LEVOTHROID Take 50 mcg by mouth daily before breakfast.   losartan 100 MG tablet Commonly known as:  COZAAR TAKE 1 2 (ONE HALF) TABLET BY MOUTH ONCE DAILY   Magnesium 400 MG Caps Take 400 mg by mouth daily.   metoprolol tartrate 25 MG tablet Commonly known as:  LOPRESSOR Take 25 mg by mouth 2 (two) times daily.   multivitamin tablet Take 1 tablet by mouth daily.   NARCAN 4 MG/0.1ML Liqd nasal spray kit Generic drug:  naloxone Place into the nose.   PROVENTIL HFA 108 (90 Base) MCG/ACT inhaler Generic drug:  albuterol Inhale into the  lungs.   TYLENOL PM EXTRA STRENGTH 25-500 MG Tabs tablet Generic drug:  diphenhydramine-acetaminophen Take 1 tablet by mouth at bedtime as needed (pain).   Vitamin D-3 5000 units Tabs Take 5,000 Units by mouth daily.          Objective:    BP 108/69   Pulse 84   Temp 99.1 F (37.3 C) (Oral)   Wt Readings from Last 3 Encounters:  01/24/18 182 lb 8.7 oz (82.8 kg)  11/16/17 220 lb (99.8 kg)  10/31/17 215 lb 6.2 oz (97.7 kg)    Physical Exam  Constitutional: She is oriented to person, place, and time. She appears well-developed and well-nourished. No distress.  Eyes: Conjunctivae are normal.  Neck: Neck supple. No thyromegaly present.  Cardiovascular: Normal rate, regular rhythm, normal heart sounds and intact distal pulses.  No murmur heard. Pulmonary/Chest: Effort normal and breath sounds normal. No respiratory distress. She has no wheezes. She has no rales.  Abdominal: Soft. Bowel sounds are normal. She exhibits no distension. There is no tenderness. There is no guarding.  Musculoskeletal: She exhibits tenderness (Left lateral wrist tenderness).  Lymphadenopathy:    She has no cervical adenopathy.  Neurological: She is alert and oriented to person, place, and time. Coordination normal.  Skin: Skin is warm and dry. No rash noted. She is not diaphoretic.  Psychiatric: She has a normal mood and affect. Her behavior is normal.  Nursing note and vitals reviewed.  X-ray: No acute bony abnormality    Assessment & Plan:   Problem List Items Addressed This Visit      Respiratory   COPD (chronic obstructive pulmonary disease) ([place])     Nervous and Auditory   Seizure disorder ([place])   Relevant Orders   Ambulatory referral to Neurology     Other   Hyperlipidemia LDL goal <100   Relevant Orders   Lipid panel (Completed)   Hypokalemia   Relevant Orders   CMP14+EGFR (Completed)   CBC with Differential/Platelet (Completed)    Other Visit Diagnoses    Low magnesium level     -  Primary   Relevant Orders   CMP14+EGFR (Completed)   Magnesium (Completed)   Left wrist pain       Relevant Orders   DG Wrist Complete Left (Completed)   Gastric wall thickening       Relevant Orders   Ambulatory referral to Gastroenterology       Follow up plan: Return in about 2 weeks (around 02/09/2018), or if symptoms worsen or fail to improve, for f/u copd and oxygen.  Counseling provided for all of the vaccine components Orders Placed This Encounter  Procedures  . DG Wrist Complete Left  . CMP14+EGFR  . CBC with Differential/Platelet  . Lipid panel  . Magnesium  . Ambulatory referral to Neurology  . Ambulatory referral to Gastroenterology    Caryl Pina, MD [facility] 01/26/2018, 9:24 AM

## 2018-01-27 ENCOUNTER — Emergency Department (HOSPITAL_COMMUNITY): Payer: Medicare PPO

## 2018-01-27 ENCOUNTER — Inpatient Hospital Stay (HOSPITAL_COMMUNITY)
Admission: EM | Admit: 2018-01-27 | Discharge: 2018-01-31 | DRG: 871 | Disposition: A | Discharge status: Home Health | Payer: Medicare PPO | Attending: Internal Medicine | Admitting: Internal Medicine

## 2018-01-27 ENCOUNTER — Encounter (HOSPITAL_COMMUNITY): Payer: Self-pay | Admitting: Emergency Medicine

## 2018-01-27 ENCOUNTER — Other Ambulatory Visit: Payer: Self-pay

## 2018-01-27 ENCOUNTER — Other Ambulatory Visit: Payer: Self-pay | Admitting: *Deleted

## 2018-01-27 DIAGNOSIS — T402X1A Poisoning by other opioids, accidental (unintentional), initial encounter: Secondary | ICD-10-CM | POA: Diagnosis not present

## 2018-01-27 DIAGNOSIS — J449 Chronic obstructive pulmonary disease, unspecified: Secondary | ICD-10-CM | POA: Diagnosis present

## 2018-01-27 DIAGNOSIS — E876 Hypokalemia: Secondary | ICD-10-CM | POA: Diagnosis not present

## 2018-01-27 DIAGNOSIS — A419 Sepsis, unspecified organism: Secondary | ICD-10-CM | POA: Diagnosis present

## 2018-01-27 DIAGNOSIS — Z9989 Dependence on other enabling machines and devices: Secondary | ICD-10-CM

## 2018-01-27 DIAGNOSIS — R402120 Coma scale, eyes open, to pain, unspecified time: Secondary | ICD-10-CM | POA: Diagnosis present

## 2018-01-27 DIAGNOSIS — M1990 Unspecified osteoarthritis, unspecified site: Secondary | ICD-10-CM | POA: Diagnosis present

## 2018-01-27 DIAGNOSIS — Z79891 Long term (current) use of opiate analgesic: Secondary | ICD-10-CM

## 2018-01-27 DIAGNOSIS — Z9660 Presence of unspecified orthopedic joint implant: Secondary | ICD-10-CM | POA: Diagnosis present

## 2018-01-27 DIAGNOSIS — J690 Pneumonitis due to inhalation of food and vomit: Secondary | ICD-10-CM | POA: Diagnosis not present

## 2018-01-27 DIAGNOSIS — M810 Age-related osteoporosis without current pathological fracture: Secondary | ICD-10-CM | POA: Diagnosis present

## 2018-01-27 DIAGNOSIS — R6520 Severe sepsis without septic shock: Secondary | ICD-10-CM

## 2018-01-27 DIAGNOSIS — Z91011 Allergy to milk products: Secondary | ICD-10-CM

## 2018-01-27 DIAGNOSIS — G4730 Sleep apnea, unspecified: Secondary | ICD-10-CM | POA: Diagnosis present

## 2018-01-27 DIAGNOSIS — J9611 Chronic respiratory failure with hypoxia: Secondary | ICD-10-CM | POA: Diagnosis not present

## 2018-01-27 DIAGNOSIS — N179 Acute kidney failure, unspecified: Secondary | ICD-10-CM | POA: Diagnosis present

## 2018-01-27 DIAGNOSIS — A4102 Sepsis due to Methicillin resistant Staphylococcus aureus: Secondary | ICD-10-CM | POA: Diagnosis not present

## 2018-01-27 DIAGNOSIS — J15212 Pneumonia due to Methicillin resistant Staphylococcus aureus: Secondary | ICD-10-CM | POA: Diagnosis present

## 2018-01-27 DIAGNOSIS — F419 Anxiety disorder, unspecified: Secondary | ICD-10-CM | POA: Diagnosis present

## 2018-01-27 DIAGNOSIS — I130 Hypertensive heart and chronic kidney disease with heart failure and stage 1 through stage 4 chronic kidney disease, or unspecified chronic kidney disease: Secondary | ICD-10-CM | POA: Diagnosis present

## 2018-01-27 DIAGNOSIS — Z888 Allergy status to other drugs, medicaments and biological substances status: Secondary | ICD-10-CM

## 2018-01-27 DIAGNOSIS — Z91041 Radiographic dye allergy status: Secondary | ICD-10-CM

## 2018-01-27 DIAGNOSIS — Z9981 Dependence on supplemental oxygen: Secondary | ICD-10-CM

## 2018-01-27 DIAGNOSIS — R0902 Hypoxemia: Secondary | ICD-10-CM | POA: Diagnosis not present

## 2018-01-27 DIAGNOSIS — J439 Emphysema, unspecified: Secondary | ICD-10-CM | POA: Diagnosis present

## 2018-01-27 DIAGNOSIS — I509 Heart failure, unspecified: Secondary | ICD-10-CM | POA: Diagnosis not present

## 2018-01-27 DIAGNOSIS — I10 Essential (primary) hypertension: Secondary | ICD-10-CM | POA: Diagnosis not present

## 2018-01-27 DIAGNOSIS — R935 Abnormal findings on diagnostic imaging of other abdominal regions, including retroperitoneum: Secondary | ICD-10-CM | POA: Diagnosis present

## 2018-01-27 DIAGNOSIS — R402310 Coma scale, best motor response, none, unspecified time: Secondary | ICD-10-CM | POA: Diagnosis not present

## 2018-01-27 DIAGNOSIS — R402220 Coma scale, best verbal response, incomprehensible words, unspecified time: Secondary | ICD-10-CM | POA: Diagnosis not present

## 2018-01-27 DIAGNOSIS — G40909 Epilepsy, unspecified, not intractable, without status epilepticus: Secondary | ICD-10-CM

## 2018-01-27 DIAGNOSIS — Z7982 Long term (current) use of aspirin: Secondary | ICD-10-CM

## 2018-01-27 DIAGNOSIS — G894 Chronic pain syndrome: Secondary | ICD-10-CM | POA: Diagnosis not present

## 2018-01-27 DIAGNOSIS — E860 Dehydration: Secondary | ICD-10-CM | POA: Diagnosis present

## 2018-01-27 DIAGNOSIS — R6521 Severe sepsis with septic shock: Secondary | ICD-10-CM | POA: Diagnosis present

## 2018-01-27 DIAGNOSIS — Z720 Tobacco use: Secondary | ICD-10-CM | POA: Diagnosis present

## 2018-01-27 DIAGNOSIS — G40901 Epilepsy, unspecified, not intractable, with status epilepticus: Secondary | ICD-10-CM | POA: Diagnosis present

## 2018-01-27 DIAGNOSIS — Z79899 Other long term (current) drug therapy: Secondary | ICD-10-CM

## 2018-01-27 DIAGNOSIS — D631 Anemia in chronic kidney disease: Secondary | ICD-10-CM | POA: Diagnosis present

## 2018-01-27 DIAGNOSIS — E1122 Type 2 diabetes mellitus with diabetic chronic kidney disease: Secondary | ICD-10-CM | POA: Diagnosis present

## 2018-01-27 DIAGNOSIS — Z886 Allergy status to analgesic agent status: Secondary | ICD-10-CM

## 2018-01-27 DIAGNOSIS — F329 Major depressive disorder, single episode, unspecified: Secondary | ICD-10-CM | POA: Diagnosis present

## 2018-01-27 DIAGNOSIS — K319 Disease of stomach and duodenum, unspecified: Secondary | ICD-10-CM | POA: Diagnosis present

## 2018-01-27 DIAGNOSIS — G709 Myoneural disorder, unspecified: Secondary | ICD-10-CM | POA: Diagnosis present

## 2018-01-27 DIAGNOSIS — N183 Chronic kidney disease, stage 3 (moderate): Secondary | ICD-10-CM | POA: Diagnosis present

## 2018-01-27 DIAGNOSIS — F17210 Nicotine dependence, cigarettes, uncomplicated: Secondary | ICD-10-CM | POA: Diagnosis present

## 2018-01-27 DIAGNOSIS — E039 Hypothyroidism, unspecified: Secondary | ICD-10-CM | POA: Diagnosis not present

## 2018-01-27 DIAGNOSIS — Z955 Presence of coronary angioplasty implant and graft: Secondary | ICD-10-CM

## 2018-01-27 DIAGNOSIS — Z881 Allergy status to other antibiotic agents status: Secondary | ICD-10-CM

## 2018-01-27 DIAGNOSIS — R55 Syncope and collapse: Secondary | ICD-10-CM | POA: Diagnosis not present

## 2018-01-27 DIAGNOSIS — Z862 Personal history of diseases of the blood and blood-forming organs and certain disorders involving the immune mechanism: Secondary | ICD-10-CM

## 2018-01-27 DIAGNOSIS — I2510 Atherosclerotic heart disease of native coronary artery without angina pectoris: Secondary | ICD-10-CM | POA: Diagnosis present

## 2018-01-27 DIAGNOSIS — Z9689 Presence of other specified functional implants: Secondary | ICD-10-CM | POA: Diagnosis present

## 2018-01-27 DIAGNOSIS — Z91018 Allergy to other foods: Secondary | ICD-10-CM

## 2018-01-27 DIAGNOSIS — Z7951 Long term (current) use of inhaled steroids: Secondary | ICD-10-CM

## 2018-01-27 DIAGNOSIS — Z7983 Long term (current) use of bisphosphonates: Secondary | ICD-10-CM

## 2018-01-27 DIAGNOSIS — Z452 Encounter for adjustment and management of vascular access device: Secondary | ICD-10-CM | POA: Diagnosis not present

## 2018-01-27 DIAGNOSIS — I493 Ventricular premature depolarization: Secondary | ICD-10-CM | POA: Diagnosis present

## 2018-01-27 DIAGNOSIS — E785 Hyperlipidemia, unspecified: Secondary | ICD-10-CM | POA: Diagnosis present

## 2018-01-27 LAB — CBC WITH DIFFERENTIAL/PLATELET
BASOS ABS: 0.1 10*3/uL (ref 0.0–0.1)
BASOS: 0 %
Basophils Absolute: 0.1 10*3/uL (ref 0.0–0.2)
Basophils Relative: 0 %
EOS (ABSOLUTE): 0.2 10*3/uL (ref 0.0–0.4)
EOS: 2 %
Eosinophils Absolute: 0.1 10*3/uL (ref 0.0–0.7)
Eosinophils Relative: 1 %
HEMATOCRIT: 42.9 % (ref 34.0–46.6)
HEMATOCRIT: 42 % (ref 36.0–46.0)
HEMOGLOBIN: 13.8 g/dL (ref 11.1–15.9)
Hemoglobin: 13.2 g/dL (ref 12.0–15.0)
Immature Grans (Abs): 0.1 10*3/uL (ref 0.0–0.1)
Immature Granulocytes: 1 %
LYMPHS ABS: 1.6 10*3/uL (ref 0.7–3.1)
LYMPHS PCT: 8 %
Lymphs Abs: 1.9 10*3/uL (ref 0.7–4.0)
Lymphs: 12 %
MCH: 29.9 pg (ref 26.6–33.0)
MCH: 29.8 pg (ref 26.0–34.0)
MCHC: 32.2 g/dL (ref 31.5–35.7)
MCHC: 31.4 g/dL (ref 30.0–36.0)
MCV: 93 fL (ref 79–97)
MCV: 94.8 fL (ref 78.0–100.0)
MONOCYTES: 11 %
Monocytes Absolute: 1.5 10*3/uL — ABNORMAL HIGH (ref 0.1–0.9)
Monocytes Absolute: 1.6 10*3/uL — ABNORMAL HIGH (ref 0.1–1.0)
Monocytes Relative: 7 %
NEUTROS ABS: 9.7 10*3/uL — AB (ref 1.4–7.0)
Neutro Abs: 19.7 10*3/uL — ABNORMAL HIGH (ref 1.7–7.7)
Neutrophils Relative %: 84 %
Neutrophils: 74 %
Platelets: 394 10*3/uL (ref 150–450)
Platelets: 418 10*3/uL — ABNORMAL HIGH (ref 150–400)
RBC: 4.62 x10E6/uL (ref 3.77–5.28)
RBC: 4.43 MIL/uL (ref 3.87–5.11)
RDW: 18.4 % — ABNORMAL HIGH (ref 12.3–15.4)
RDW: 17.7 % — ABNORMAL HIGH (ref 11.5–15.5)
WBC: 13.1 10*3/uL — AB (ref 3.4–10.8)
WBC: 23.3 10*3/uL — ABNORMAL HIGH (ref 4.0–10.5)

## 2018-01-27 LAB — COMPREHENSIVE METABOLIC PANEL
ALBUMIN: 3.4 g/dL — AB (ref 3.5–5.0)
ALT: 25 U/L (ref 0–44)
AST: 29 U/L (ref 15–41)
Alkaline Phosphatase: 69 U/L (ref 38–126)
Anion gap: 19 — ABNORMAL HIGH (ref 5–15)
BILIRUBIN TOTAL: 0.5 mg/dL (ref 0.3–1.2)
BUN: 34 mg/dL — AB (ref 8–23)
CHLORIDE: 91 mmol/L — AB (ref 98–111)
CO2: 26 mmol/L (ref 22–32)
CREATININE: 3.6 mg/dL — AB (ref 0.44–1.00)
Calcium: 8.6 mg/dL — ABNORMAL LOW (ref 8.9–10.3)
GFR calc Af Amer: 14 mL/min — ABNORMAL LOW (ref >60)
GFR, EST NON AFRICAN AMERICAN: 12 mL/min — AB (ref >60)
GLUCOSE: 128 mg/dL — AB (ref 70–99)
Potassium: 3.2 mmol/L — ABNORMAL LOW (ref 3.5–5.1)
Sodium: 136 mmol/L (ref 135–145)
Total Protein: 7.8 g/dL (ref 6.5–8.1)

## 2018-01-27 LAB — CMP14+EGFR
ALT: 31 IU/L (ref 0–32)
AST: 34 IU/L (ref 0–40)
Albumin/Globulin Ratio: 1.1 — ABNORMAL LOW (ref 1.2–2.2)
Albumin: 3.8 g/dL (ref 3.6–4.8)
Alkaline Phosphatase: 83 IU/L (ref 39–117)
BUN/Creatinine Ratio: 18 (ref 12–28)
BUN: 21 mg/dL (ref 8–27)
Bilirubin Total: <0.2 mg/dL (ref 0.0–1.2)
CO2: 30 mmol/L — ABNORMAL HIGH (ref 20–29)
Calcium: 10.3 mg/dL (ref 8.7–10.3)
Chloride: 92 mmol/L — ABNORMAL LOW (ref 96–106)
Creatinine, Ser: 1.16 mg/dL — ABNORMAL HIGH (ref 0.57–1.00)
GFR calc Af Amer: 57 mL/min/1.73 — ABNORMAL LOW (ref >59)
GFR calc non Af Amer: 49 mL/min/1.73 — ABNORMAL LOW (ref >59)
Globulin, Total: 3.4 g/dL (ref 1.5–4.5)
Glucose: 117 mg/dL — ABNORMAL HIGH (ref 65–99)
Potassium: 4.7 mmol/L (ref 3.5–5.2)
Sodium: 142 mmol/L (ref 134–144)
Total Protein: 7.2 g/dL (ref 6.0–8.5)

## 2018-01-27 LAB — URINALYSIS, ROUTINE W REFLEX MICROSCOPIC
Glucose, UA: NEGATIVE mg/dL
Hgb urine dipstick: NEGATIVE
KETONES UR: NEGATIVE mg/dL
Leukocytes, UA: NEGATIVE
Nitrite: NEGATIVE
PH: 5 (ref 5.0–8.0)
PROTEIN: 30 mg/dL — AB
Specific Gravity, Urine: 1.024 (ref 1.005–1.030)

## 2018-01-27 LAB — ETHANOL: Alcohol, Ethyl (B): <10 mg/dL (ref <10)

## 2018-01-27 LAB — CBG MONITORING, ED: Glucose-Capillary: 130 mg/dL — ABNORMAL HIGH (ref 70–99)

## 2018-01-27 LAB — BLOOD GAS, ARTERIAL
Acid-Base Excess: 2 mmol/L (ref 0.0–2.0)
BICARBONATE: 25.9 mmol/L (ref 20.0–28.0)
DRAWN BY: 22223
O2 Content: 4 L/min
O2 Saturation: 96.5 %
PCO2 ART: 45.1 mmHg (ref 32.0–48.0)
PH ART: 7.387 (ref 7.350–7.450)
PO2 ART: 90.2 mmHg (ref 83.0–108.0)

## 2018-01-27 LAB — RAPID URINE DRUG SCREEN, HOSP PERFORMED
AMPHETAMINES: NOT DETECTED
Benzodiazepines: NOT DETECTED
COCAINE: NOT DETECTED
Opiates: POSITIVE — AB
TETRAHYDROCANNABINOL: NOT DETECTED

## 2018-01-27 LAB — LIPID PANEL
CHOL/HDL RATIO: 7 ratio — AB (ref 0.0–4.4)
Cholesterol, Total: 231 mg/dL — ABNORMAL HIGH (ref 100–199)
HDL: 33 mg/dL — ABNORMAL LOW (ref >39)
LDL CALC: 143 mg/dL — AB (ref 0–99)
TRIGLYCERIDES: 276 mg/dL — AB (ref 0–149)
VLDL Cholesterol Cal: 55 mg/dL — ABNORMAL HIGH (ref 5–40)

## 2018-01-27 LAB — TROPONIN I: Troponin I: <0.03 ng/mL (ref <0.03)

## 2018-01-27 LAB — AMMONIA: Ammonia: 21 umol/L (ref 9–35)

## 2018-01-27 LAB — MAGNESIUM: Magnesium: 1.3 mg/dL — ABNORMAL LOW (ref 1.6–2.3)

## 2018-01-27 LAB — I-STAT CG4 LACTIC ACID, ED: LACTIC ACID, VENOUS: 4.98 mmol/L — AB (ref 0.5–1.9)

## 2018-01-27 LAB — CK: CK TOTAL: 223 U/L (ref 38–234)

## 2018-01-27 MED ORDER — LEVOFLOXACIN IN D5W 750 MG/150ML IV SOLN
750.0000 mg | Freq: Once | INTRAVENOUS | Status: AC
  Administered 2018-01-27: 750 mg via INTRAVENOUS
  Filled 2018-01-27: qty 150

## 2018-01-27 MED ORDER — VANCOMYCIN HCL 500 MG IV SOLR
INTRAVENOUS | Status: AC
  Filled 2018-01-27: qty 500

## 2018-01-27 MED ORDER — LEVOFLOXACIN IN D5W 500 MG/100ML IV SOLN: 500.0000 mg | INTRAVENOUS | Status: DC

## 2018-01-27 MED ORDER — NALOXONE HCL 2 MG/2ML IJ SOSY
1.0000 mg | PREFILLED_SYRINGE | Freq: Once | INTRAMUSCULAR | Status: AC
  Administered 2018-01-27: 1 mg via INTRAVENOUS

## 2018-01-27 MED ORDER — MAGNESIUM GLUCONATE 550 MG PO TABS: 1.0000 | ORAL_TABLET | Freq: Every day | ORAL | 5 refills | Status: DC

## 2018-01-27 MED ORDER — VANCOMYCIN HCL IN DEXTROSE 1-5 GM/200ML-% IV SOLN
1000.0000 mg | Freq: Once | INTRAVENOUS | Status: AC
  Administered 2018-01-28: 1000 mg via INTRAVENOUS
  Filled 2018-01-27: qty 200

## 2018-01-27 MED ORDER — NALOXONE HCL 2 MG/2ML IJ SOSY
PREFILLED_SYRINGE | INTRAMUSCULAR | Status: AC
  Filled 2018-01-27: qty 2

## 2018-01-27 MED ORDER — NOREPINEPHRINE 4 MG/250ML-% IV SOLN
0.0000 ug/min | INTRAVENOUS | Status: DC
  Administered 2018-01-28: 15 ug/min via INTRAVENOUS
  Administered 2018-01-28: 6 ug/min via INTRAVENOUS
  Administered 2018-01-28: 9 ug/min via INTRAVENOUS
  Administered 2018-01-28: 10 ug/min via INTRAVENOUS
  Filled 2018-01-27 (×4): qty 250

## 2018-01-27 MED ORDER — SODIUM CHLORIDE 0.9 % IV BOLUS
1000.0000 mL | Freq: Once | INTRAVENOUS | Status: AC
  Administered 2018-01-27: 1000 mL via INTRAVENOUS

## 2018-01-27 MED ORDER — SODIUM CHLORIDE 0.9 % IV SOLN
1.0000 g | Freq: Three times a day (TID) | INTRAVENOUS | Status: DC
  Administered 2018-01-28 – 2018-01-30 (×7): 1 g via INTRAVENOUS
  Filled 2018-01-27 (×8): qty 1

## 2018-01-27 MED ORDER — ACETAMINOPHEN 650 MG RE SUPP
RECTAL | Status: AC
  Filled 2018-01-27: qty 1

## 2018-01-27 MED ORDER — SODIUM CHLORIDE 0.9 % IV SOLN
2.0000 g | Freq: Once | INTRAVENOUS | Status: AC
  Administered 2018-01-27: 2 g via INTRAVENOUS
  Filled 2018-01-27: qty 2

## 2018-01-27 MED ORDER — VANCOMYCIN HCL 500 MG IV SOLR
500.0000 mg | Freq: Once | INTRAVENOUS | Status: AC
  Administered 2018-01-28: 500 mg via INTRAVENOUS
  Filled 2018-01-27: qty 500

## 2018-01-27 MED ORDER — VANCOMYCIN HCL IN DEXTROSE 1-5 GM/200ML-% IV SOLN
1000.0000 mg | INTRAVENOUS | Status: DC
  Administered 2018-01-29: 1000 mg via INTRAVENOUS
  Filled 2018-01-27: qty 200

## 2018-01-27 MED ORDER — ACETAMINOPHEN 650 MG RE SUPP
650.0000 mg | Freq: Once | RECTAL | Status: AC
  Administered 2018-01-27: 650 mg via RECTAL

## 2018-01-27 MED ORDER — SODIUM CHLORIDE 0.9 % IV BOLUS
1400.0000 mL | Freq: Once | INTRAVENOUS | Status: AC
  Administered 2018-01-27: 1400 mL via INTRAVENOUS

## 2018-01-27 NOTE — ED Provider Notes (Addendum)
Physical Exam  BP (!) 69/45   Pulse 77   Temp (!) 101.1 F (38.4 C)   Resp (!) 21   SpO2 93%   Physical Exam  ED Course/Procedures     .Central Line Date/Time: 01/28/2018 12:22 AM Performed by: Varney Biles, MD Authorized by: Varney Biles, MD   Consent:    Consent obtained:  Written   Consent given by:  Patient   Risks discussed:  Arterial puncture, incorrect placement, pneumothorax, infection and bleeding   Alternatives discussed:  No treatment Universal protocol:    Procedure explained and questions answered to patient or proxy's satisfaction: yes     Relevant documents present and verified: yes     Test results available and properly labeled: yes     Imaging studies available: yes     Required blood products, implants, devices, and special equipment available: yes     Site/side marked: yes     Immediately prior to procedure, a time out was called: yes     Patient identity confirmed:  Arm band Pre-procedure details:    Hand hygiene: Hand hygiene performed prior to insertion     Sterile barrier technique: All elements of maximal sterile technique followed     Skin preparation:  ChloraPrep   Skin preparation agent: Skin preparation agent completely dried prior to procedure   Anesthesia (see MAR for exact dosages):    Anesthesia method:  Local infiltration   Local anesthetic:  Lidocaine 1% WITH epi Procedure details:    Location:  R internal jugular   Patient position:  Trendelenburg   Procedural supplies:  Triple lumen   Landmarks identified: yes     Ultrasound guidance: yes     Sterile ultrasound techniques: Sterile gel and sterile probe covers were used     Number of attempts:  1   Successful placement: yes   Post-procedure details:    Post-procedure:  Dressing applied and line sutured   Assessment:  Blood return through all ports and free fluid flow   Patient tolerance of procedure:  Tolerated well, no immediate complications Comments:     Right  subclavian line was attempted initially, followed by right IJ.     CRITICAL CARE Performed by: Lurine Imel   Total critical care time: 34 minutes  Critical care time was exclusive of separately billable procedures and treating other patients.  Critical care was necessary to treat or prevent imminent or life-threatening deterioration.  Critical care was time spent personally by me on the following activities: development of treatment plan with patient and/or surrogate as well as nursing, discussions with consultants, evaluation of patient's response to treatment, examination of patient, obtaining history from patient or surrogate, ordering and performing treatments and interventions, ordering and review of laboratory studies, ordering and review of radiographic studies, pulse oximetry and re-evaluation of patient's condition.   MDM  Pt comes in with hypoxia. Pt is noted to be listless. Her BP and O2 sats were low. She has hx of COPD, Seizures. She is noted to have fevers, and lactic acidosis. Pt has refused central line.  Sepsis protocol initiated. 30 cc/kg of fluid ordered. She is hypotensive, and medicine has admitted. Peripheral pressors will be started if pt remains hypotensive.   Varney Biles, MD 01/27/18 2202   12:20 AM Central line has been placed after patient changed her mind. Right subclavian was unsuccessful, so right IJ was placed. Repeat lactate is improved. Pressors have been ordered for the hypotension, once the central line is cleared  with a chest x-ray, pressors will be started for hypotension. Patient has received 2.5 L of fluid according to the nurse.  Vancomycin infusion is running, the brought her spectrum antibiotic has already been completed. Sepsis reassessment has been completed.      Varney Biles, MD 01/28/18 [phone]

## 2018-01-27 NOTE — ED Notes (Signed)
Pt responding to name calling now

## 2018-01-27 NOTE — Progress Notes (Signed)
Pharmacy Antibiotic Note  Tasha Clarke is a 67 y.o. female admitted on 01/27/2018 with sepsis.  Pharmacy has been consulted for aztreonam, vancomycin, levofloxacin dosing.  Plan: Vancomycin 1000mg  IV every 48 hours.  Goal trough 15-20 mcg/mL. aztreonam 2gm iv x 1, followed by aztreonam 1gm iv q8h. Levofloxacin 750mg  iv x 1, followed by levofloxacin 500mg  iv q48h Following pharmacist to order vancomycin trough based on renal function changes and administration times     Temp (24hrs), Avg:102.1 F (38.9 C), Min:101.1 F (38.4 C), Max:103.1 F (39.5 C)  Recent Labs  Lab 01/21/18 0646  01/22/18 0439 01/23/18 0543 01/24/18 0350 01/26/18 0931 01/27/18 1945 01/27/18 2041  WBC 10.2  --  13.3* 9.6  --  13.1* 23.3*  --   CREATININE 0.50   < > 0.60 0.64 0.69 1.16* 3.60*  --   LATICACIDVEN  --   --   --   --   --   --   --  4.98*   < > = values in this interval not displayed.    Estimated Creatinine Clearance: 16.3 mL/min (A) (by C-G formula based on SCr of 3.6 mg/dL (H)).    Allergies  Allergen Reactions  . Cephalexin Hives  . Ketorolac Tromethamine Hives  . Lac Bovis Nausea And Vomiting  . Imdur  [Isosorbide Dinitrate]   . Iodinated Diagnostic Agents   . Other Other (See Comments)    Alka seltzer causes blurred vision, fainting  . Sodium Bicarbonate-Citric Acid   . Milk-Related Compounds Nausea And Vomiting   Antimicrobials this admission:  7/19 vancomycin >>  7/19 aztreonam >>  7/19 levofloxacin >>  Dose adjustments this admission:  Serum creatinine: 3.6 mg/dL (H) 01/27/18 1945 Estimated creatinine clearance: 16.3 mL/min (A) Doses adjusted for renal fx   Microbiology results:  7/19 BCx: sent  7/19 MRSA PCR: ordered   Thank you for allowing pharmacy to be a part of this patient's care.  Donna Christen Estella Malatesta 01/27/2018 9:24 PM

## 2018-01-27 NOTE — H&P (Addendum)
History and Physical  Tasha Clarke VZD:[id number] DOB: Jul 19, 1950 DOA: 01/27/2018  Referring physician: Dr Lita Mains, ED physician PCP: Dettinger, Fransisca Kaufmann, MD  Outpatient Specialists:   Patient Coming From: home  Chief Complaint: confusion  HPI: Tasha Clarke is a 67 y.o. female with a history of Seizure disorder, chronic kidney disease stage III, COPD, diabetes, sleep apnea: Thyroid disease.  Patient was recently hospitalized for gastroenteritis and status epilepticus.  She was on Keppra, Vimpat, and intubated for a brief period of time.  She transferred to [facility] due to being intubated due to concerns for aspiration and inability to protect her airway during seizure activity.  She made steady improvements and was eventually discharged 3 days ago.  She was discharged to home on antiepileptics with tapering off Vimpat a couple days ago.  Today, the patient was outside and fell early today.  She got herself up and went inside.  When her husband came home this evening, she went outside with him.  She appeared to be somewhat confused.  She brought her inside and found her oxygen saturation to be in the 50s.  EMS was called and the patient was transferred here.    In the emergency department, she was noted to be quite hypotensive, febrile, and quite somnolent.  Patient was given Narcan, 1 L fluid bolus, and started on IV antibiotics: Vancomycin, Levaquin, aztreonam.  These were obtained after blood cultures obtained.  Initially, the Narcan seemed to improved the patient's blood pressure, but her blood pressure decreased again.  After my conversation with the ER physician, will fully bolus the patient 30 mils per kilogram per the sepsis protocol.  Will reevaluate patient's blood pressure.  If remains low, the patient will need central line and pressors.  Emergency Department Course: White blood cell count 23.3.  Lactic acid 4.98.  Creatinine 3.6.  Potassium 3.2.  Urine normal.  Chest  x-ray shows tracheal thickening suggestive of bronchitis.  No infiltrates.  Review of Systems:   Pt denies any nausea, vomiting, diarrhea, constipation, abdominal pain, shortness of breath, dyspnea on exertion, orthopnea, cough, wheezing, palpitations, headache, vision changes, lightheadedness, dizziness, melena, rectal bleeding.  Review of systems are otherwise negative  Past Medical History:  Diagnosis Date  . Allergy   . Anemia   . Anxiety   . Arthritis   . Asthma   . Blood transfusion without reported diagnosis   . Cataract   . CHF (congestive heart failure) ([place])   . Chronic kidney disease   . Clotting disorder ([place])   . COPD (chronic obstructive pulmonary disease) ([place])   . Depression   . Diabetes mellitus without complication ([place])   . Emphysema of lung ([place])   . GERD (gastroesophageal reflux disease)   . Hyperlipidemia   . Hypertension   . Neuromuscular disorder ([place])   . Osteoporosis   . Oxygen deficiency   . Seizures ([place])   . Skin cancer    2 areas removed more than 10 years ago  . Sleep apnea 1985   wears cpap nightly  . Thyroid disease    Past Surgical History:  Procedure Laterality Date  . APPENDECTOMY    . EYE SURGERY    . FRACTURE SURGERY    . heart stents    . JOINT REPLACEMENT    . SPINAL CORD STIMULATOR IMPLANT  2015  . SPINE SURGERY     Social History:  reports that she has been smoking cigarettes.  She has a 32.25 pack-year  smoking history. She has never used smokeless tobacco. She reports that she does not drink alcohol or use drugs. Patient lives at home with husband  Allergies  Allergen Reactions  . Cephalexin Hives  . Ketorolac Tromethamine Hives  . Lac Bovis Nausea And Vomiting  . Imdur  [Isosorbide Dinitrate]   . Iodinated Diagnostic Agents   . Other Other (See Comments)    Alka seltzer causes blurred vision, fainting  . Sodium Bicarbonate-Citric Acid   . Milk-Related Compounds Nausea And Vomiting    Family History  Problem  Relation Age of Onset  . Arthritis Mother   . Asthma Mother   . Cancer Mother        lung  . Depression Mother   . Diabetes Mother   . Arthritis Father   . Asthma Father   . Depression Father   . Asthma Sister   . Depression Sister   . Diabetes Sister   . Arthritis Maternal Grandmother   . Asthma Maternal Grandmother   . Depression Maternal Grandmother   . Arthritis Maternal Grandfather   . Asthma Maternal Grandfather   . Arthritis Paternal Grandmother   . Asthma Paternal Grandmother   . Arthritis Paternal Grandfather   . Asthma Paternal Grandfather   . COPD Paternal Grandfather   . Diabetes Sister   . Mental illness Sister   . Anemia Sister   . Hypertension Son   . Post-traumatic stress disorder Son   . Allergies Daughter       Prior to Admission medications   Medication Sig Start Date End Date Taking? Authorizing Provider  albuterol (PROVENTIL HFA) 108 (90 Base) MCG/ACT inhaler Inhale into the lungs.   Yes [provider]  alendronate (FOSAMAX) 70 MG tablet Take 1 tablet (70 mg total) by mouth every Monday. Take with a full glass of water on an empty stomach. 12/05/17  Yes Dettinger, Fransisca Kaufmann, MD  aspirin EC 81 MG tablet Take 81 mg by mouth daily.   Yes [provider]  atorvastatin (LIPITOR) 40 MG tablet Take 40 mg by mouth daily.   Yes [provider]  cetirizine (ZYRTEC) 10 MG tablet Take 1 tablet (10 mg total) by mouth daily. 08/31/17  Yes Dettinger, Fransisca Kaufmann, MD  Cholecalciferol (VITAMIN D-3) 5000 units TABS Take 5,000 Units by mouth daily.   Yes [provider]  cyclobenzaprine (FLEXERIL) 10 MG tablet Take 1 tablet (10 mg total) by mouth 2 (two) times daily as needed for muscle spasms. 01/26/18  Yes Dettinger, Fransisca Kaufmann, MD  diphenhydramine-acetaminophen (TYLENOL PM EXTRA STRENGTH) 25-500 MG TABS tablet Take 1 tablet by mouth at bedtime as needed (pain).    Yes [provider]  doxycycline (VIBRA-TABS) 100 MG tablet Take 1  tablet (100 mg total) by mouth every 12 (twelve) hours. 01/24/18  Yes Patrecia Pour, MD  DULoxetine (CYMBALTA) 30 MG capsule Take 30 mg by mouth daily.   Yes [provider]  EPINEPHrine 0.3 mg/0.3 mL IJ SOAJ injection Inject 0.3 mg into the muscle as needed.   Yes [provider]  Ferrous Sulfate (IRON) 325 (65 Fe) MG TABS Take 1 tablet (325 mg total) by mouth daily. 08/27/17  Yes Velvet Bathe, MD  fluticasone (FLONASE) 50 MCG/ACT nasal spray Place 2 sprays into both nostrils daily. 08/31/17  Yes Dettinger, Fransisca Kaufmann, MD  fluticasone furoate-vilanterol (BREO ELLIPTA) 200-25 MCG/INH AEPB Inhale 1 puff into the lungs daily. 08/31/17  Yes Dettinger, Fransisca Kaufmann, MD  Fluticasone-Salmeterol (ADVAIR) 250-50 MCG/DOSE AEPB  Inhale 1 puff into the lungs 2 (two) times daily.   Yes [provider]  gabapentin (NEURONTIN) 600 MG tablet Take 1 tablet (600 mg total) by mouth 4 (four) times daily. 10/26/17  Yes Dettinger, Fransisca Kaufmann, MD  ipratropium-albuterol (DUONEB) 0.5-2.5 (3) MG/3ML SOLN Take 3 mLs by nebulization every 6 (six) hours as needed (shortness of breathe).    Yes [provider]  lacosamide (VIMPAT) 50 MG TABS tablet Take 2 tablets (100 mg total) by mouth 2 (two) times daily for 1 day, THEN 1 tablet (50 mg total) 2 (two) times daily for 1 day. 01/25/18 01/27/18 Yes Patrecia Pour, MD  levETIRAcetam (KEPPRA) 1000 MG tablet TAKE 1 TABLET BY MOUTH AT BEDTIME 12/28/17  Yes Dettinger, Fransisca Kaufmann, MD  levothyroxine (SYNTHROID, LEVOTHROID) 50 MCG tablet Take 50 mcg by mouth daily before breakfast.   Yes [provider]  losartan (COZAAR) 100 MG tablet TAKE 1 2 (ONE HALF) TABLET BY MOUTH ONCE DAILY 01/02/18  Yes [provider]  Magnesium 400 MG CAPS Take 400 mg by mouth daily.   Yes [provider]  Magnesium Gluconate 550 MG TABS Take 1 tablet (550 mg total) by mouth daily. 01/27/18  Yes Dettinger, Fransisca Kaufmann, MD  metoprolol tartrate (LOPRESSOR) 25 MG tablet Take  25 mg by mouth 2 (two) times daily.   Yes [provider]  Morphine-Naltrexone (EMBEDA) 30-1.2 MG CPCR Take 1 capsule by mouth 2 (two) times daily.   Yes [provider]  Multiple Vitamin (MULTIVITAMIN) tablet Take 1 tablet by mouth daily.   Yes [provider]  naloxone (NARCAN) nasal spray 4 mg/0.1 mL Place into the nose. 08/18/17  Yes [provider]  Omega-3 Fatty Acids (FISH OIL) 1000 MG CAPS Take 1,000 mg by mouth daily.   Yes [provider]    Physical Exam: BP (!) 69/45   Pulse 77   Temp (!) 101.1 F (38.4 C)   Resp (!) 21   SpO2 93%   . General: Elderly Caucasian female. Awake and alert and oriented x3. No acute cardiopulmonary distress.  Marland Kitchen HEENT: Normocephalic atraumatic.  Right and left ears normal in appearance.  Pupils equal, round, reactive to light. Extraocular muscles are intact. Sclerae anicteric and noninjected.  Moist mucosal membranes. No mucosal lesions.  . Neck: Neck supple without lymphadenopathy. No carotid bruits. No masses palpated.  . Cardiovascular: Regular rate with normal S1-S2 sounds. No murmurs, rubs, gallops auscultated. No JVD.  Marland Kitchen Respiratory: Good respiratory effort.  Mild wheeze.  No rales.  No accessory muscle use. . Abdomen: Soft, nontender, nondistended. Active bowel sounds. No masses or hepatosplenomegaly  . Skin: No rashes, lesions, or ulcerations.  Dry, warm to touch. 2+ dorsalis pedis and radial pulses. . Musculoskeletal: No calf or leg pain. All major joints not erythematous nontender.  No upper or lower joint deformation.  Good ROM.  No contractures  . Psychiatric: Intact judgment and insight. Pleasant and cooperative. . Neurologic: No focal neurological deficits. Strength is 5/5 and symmetric in upper and lower extremities.  Cranial nerves II through XII are grossly intact.           Labs on Admission: I have personally reviewed following labs and imaging studies  CBC: Recent Labs  Lab  01/21/18 0646 01/22/18 0439 01/23/18 0543 01/26/18 0931 01/27/18 1945  WBC 10.2 13.3* 9.6 13.1* 23.3*  NEUTROABS  --   --   --  9.7* 19.7*  HGB 11.0* 12.0 12.0 13.8 13.2  HCT  37.1 38.8 38.7 42.9 42.0  MCV 95.6 93.7 91.9 93 94.8  PLT 189 234 232 394 099*   Basic Metabolic Panel: Recent Labs  Lab 01/21/18 0646  01/22/18 0439 01/23/18 0543 01/24/18 0350 01/26/18 0931 01/27/18 1945  NA 139   < > 140 140 141 142 136  K 4.3   < > 4.0 3.6 3.7 4.7 3.2*  CL 110   < > 104 96* 97* 92* 91*  CO2 22   < > 27 33* 29 30* 26  GLUCOSE 142*   < > 158* 111* 94 117* 128*  BUN <5*   < > 7* 8 14 21  34*  CREATININE 0.50   < > 0.60 0.64 0.69 1.16* 3.60*  CALCIUM 8.3*   < > 9.4 9.3 9.2 10.3 8.6*  MG 1.9  --  1.3* 1.1* 1.2* 1.3*  --   PHOS  --   --  3.8 4.6  --   --   --    < > = values in this interval not displayed.   GFR: Estimated Creatinine Clearance: 16.3 mL/min (A) (by C-G formula based on SCr of 3.6 mg/dL (H)). Liver Function Tests: Recent Labs  Lab 01/26/18 0931 01/27/18 1945  AST 34 29  ALT 31 25  ALKPHOS 83 69  BILITOT <0.2 0.5  PROT 7.2 7.8  ALBUMIN 3.8 3.4*   No results for input(s): LIPASE, AMYLASE in the last 168 hours. Recent Labs  Lab 01/27/18 2004  AMMONIA 21   Coagulation Profile: No results for input(s): INR, PROTIME in the last 168 hours. Cardiac Enzymes: Recent Labs  Lab 01/27/18 1945  CKTOTAL 223  TROPONINI <0.03   BNP (last 3 results) No results for input(s): PROBNP in the last 8760 hours. HbA1C: No results for input(s): HGBA1C in the last 72 hours. CBG: Recent Labs  Lab 01/23/18 1604 01/23/18 2126 01/24/18 0740 01/24/18 1206 01/27/18 1947  GLUCAP 83 90 107* 109* 130*   Lipid Profile: Recent Labs    01/26/18 0931  CHOL 231*  HDL 33*  LDLCALC 143*  TRIG 276*  CHOLHDL 7.0*   Thyroid Function Tests: No results for input(s): TSH, T4TOTAL, FREET4, T3FREE, THYROIDAB in the last 72 hours. Anemia Panel: No results for input(s):  VITAMINB12, FOLATE, FERRITIN, TIBC, IRON, RETICCTPCT in the last 72 hours. Urine analysis:    Component Value Date/Time   COLORURINE AMBER (A) 01/27/2018 2046   APPEARANCEUR CLOUDY (A) 01/27/2018 2046   LABSPEC 1.024 01/27/2018 2046   PHURINE 5.0 01/27/2018 2046   GLUCOSEU NEGATIVE 01/27/2018 2046   HGBUR NEGATIVE 01/27/2018 2046   BILIRUBINUR SMALL (A) 01/27/2018 2046   [place] NEGATIVE 01/27/2018 2046   PROTEINUR 30 (A) 01/27/2018 2046   NITRITE NEGATIVE 01/27/2018 2046   LEUKOCYTESUR NEGATIVE 01/27/2018 2046   Sepsis Labs: @LABRCNTIP (procalcitonin:4,lacticidven:4) ) Recent Results (from the past 240 hour(s))  Gastrointestinal Panel by PCR , Stool     Status: None   Collection Time: 01/19/18  3:00 PM  Result Value Ref Range Status   Campylobacter species NOT DETECTED NOT DETECTED Final   Plesimonas shigelloides NOT DETECTED NOT DETECTED Final   Salmonella species NOT DETECTED NOT DETECTED Final   Yersinia enterocolitica NOT DETECTED NOT DETECTED Final   Vibrio species NOT DETECTED NOT DETECTED Final   Vibrio cholerae NOT DETECTED NOT DETECTED Final   Enteroaggregative E coli (EAEC) NOT DETECTED NOT DETECTED Final   Enteropathogenic E coli (EPEC) NOT DETECTED NOT DETECTED Final   Enterotoxigenic E coli (ETEC) NOT DETECTED NOT  DETECTED Final   Shiga like toxin producing E coli (STEC) NOT DETECTED NOT DETECTED Final   Shigella/Enteroinvasive E coli (EIEC) NOT DETECTED NOT DETECTED Final   Cryptosporidium NOT DETECTED NOT DETECTED Final   Cyclospora cayetanensis NOT DETECTED NOT DETECTED Final   Entamoeba histolytica NOT DETECTED NOT DETECTED Final   Giardia lamblia NOT DETECTED NOT DETECTED Final   Adenovirus F40/41 NOT DETECTED NOT DETECTED Final   Astrovirus NOT DETECTED NOT DETECTED Final   Norovirus GI/GII NOT DETECTED NOT DETECTED Final   Rotavirus A NOT DETECTED NOT DETECTED Final   Sapovirus (I, II, IV, and V) NOT DETECTED NOT DETECTED Final    Comment: Performed at  [facility], [address] [place], [place] [address]  Culture, blood (routine x 2)     Status: None   Collection Time: 01/21/18  5:40 PM  Result Value Ref Range Status   Specimen Description BLOOD RIGHT HAND  Final   Special Requests   Final    BOTTLES DRAWN AEROBIC AND ANAEROBIC Blood Culture adequate volume   Culture   Final    NO GROWTH 5 DAYS Performed at [facility], [place] [address], [place], [place] [address]    Report Status 01/26/2018 FINAL  Final  Culture, blood (routine x 2)     Status: None   Collection Time: 01/21/18  5:41 PM  Result Value Ref Range Status   Specimen Description BLOOD RIGHT HAND  Final   Special Requests   Final    BOTTLES DRAWN AEROBIC AND ANAEROBIC Blood Culture adequate volume   Culture   Final    NO GROWTH 5 DAYS Performed at [facility], [place] [address], [place], [place] [address]    Report Status 01/26/2018 FINAL  Final  Culture, respiratory (NON-Expectorated)     Status: None   Collection Time: 01/22/18 10:08 AM  Result Value Ref Range Status   Specimen Description TRACHEAL ASPIRATE  Final   Special Requests NONE  Final   Gram Stain   Final    ABUNDANT WBC PRESENT, PREDOMINANTLY PMN ABUNDANT GRAM POSITIVE COCCI Performed at [facility], [place] [address] [place], [place] [address]    Culture   Final    MODERATE METHICILLIN RESISTANT STAPHYLOCOCCUS AUREUS   Report Status 01/24/2018 FINAL  Final   Organism ID, Bacteria METHICILLIN RESISTANT STAPHYLOCOCCUS AUREUS  Final      Susceptibility   Methicillin resistant staphylococcus aureus - MIC*    CIPROFLOXACIN >=8 RESISTANT Resistant     ERYTHROMYCIN >=8 RESISTANT Resistant     GENTAMICIN <=0.5 SENSITIVE Sensitive     OXACILLIN >=4 RESISTANT Resistant     TETRACYCLINE 2 SENSITIVE Sensitive     VANCOMYCIN 1 SENSITIVE Sensitive     TRIMETH/SULFA <=10 SENSITIVE Sensitive     CLINDAMYCIN >=8 RESISTANT Resistant     RIFAMPIN <=0.5 SENSITIVE Sensitive      Inducible Clindamycin NEGATIVE Sensitive     * MODERATE METHICILLIN RESISTANT STAPHYLOCOCCUS AUREUS  Culture, blood (Routine X 2) w Reflex to ID Panel     Status: None (Preliminary result)   Collection Time: 01/27/18  8:35 PM  Result Value Ref Range Status   Specimen Description BLOOD LEFT HAND  Final   Special Requests   Final    BOTTLES DRAWN AEROBIC AND ANAEROBIC Blood Culture adequate volume Performed at [facility], [address] [place], [place] [address]    Culture PENDING  Incomplete   Report Status PENDING  Incomplete  Radiological Exams on Admission: Dg Wrist Complete Left  Result Date: 01/26/2018 CLINICAL DATA:  Status post fall. EXAM: LEFT WRIST - COMPLETE 3+ VIEW COMPARISON:  None. FINDINGS: Generalized osteopenia. No acute fracture or subluxation. Severe osteoarthritis of the first [facility] joint. Mild osteoarthritis of the scaphotrapeziotrapezoid joint. Soft tissues are normal. IMPRESSION: No acute osseous injury of the left wrist. Electronically Signed   By: Kathreen Devoid   On: 01/26/2018 15:13   Ct Head Wo Contrast  Result Date: 01/27/2018 CLINICAL DATA:  Hypoxia, syncope. History of hypertension, hyperlipidemia, diabetes, seizure. EXAM: CT HEAD WITHOUT CONTRAST TECHNIQUE: Contiguous axial images were obtained from the base of the skull through the vertex without intravenous contrast. COMPARISON:  CT HEAD January 21, 2018 and CT HEAD January 18, 2018 FINDINGS: BRAIN: No intraparenchymal hemorrhage, mass effect nor midline shift. The ventricles and sulci are normal for age. No acute large vascular territory infarcts. No abnormal extra-axial fluid collections. Basal cisterns are patent. VASCULAR: Mild calcific atherosclerosis of the carotid siphons. SKULL: No skull fracture. Osteopenia. No significant scalp soft tissue swelling. SINUSES/ORBITS: The mastoid air-cells and included paranasal sinuses are well-aerated. RIGHT frontal osteoma. The included ocular globes and orbital contents are  non-suspicious. OTHER: None. IMPRESSION: Stable negative non-contrast CT HEAD for age. Electronically Signed   By: Elon Alas M.D.   On: 01/27/2018 21:14   Dg Chest Port 1 View  Result Date: 01/27/2018 CLINICAL DATA:  Hypoxia EXAM: PORTABLE CHEST 1 VIEW COMPARISON:  01/22/2018 FINDINGS: Interval extubation. Lower cervical plate and screw fixator. Dorsal column stimulator noted. Atherosclerotic calcification of the aortic arch. Low lung volumes are present, causing crowding of the pulmonary vasculature. Mild enlargement of the cardiopericardial silhouette with indistinctness of the pulmonary vasculature suggesting pulmonary venous hypertension. Mild bilateral airway thickening. IMPRESSION: 1. Airway thickening is present, suggesting bronchitis or reactive airways disease. 2. Mild enlargement of the cardiopericardial silhouette with pulmonary venous hypertension. 3.  Atherosclerotic calcification of the aortic arch. 4. Interval extubation. Electronically Signed   By: Van Clines M.D.   On: 01/27/2018 20:28    EKG: Independently reviewed.  Sinus rhythm.  ST depression in lateral leads.  Prolonged PR.  No changes from previous.  No STEMI   Assessment/Plan: Principal Problem:   Severe sepsis with septic shock (HCC) Active Problems:   COPD (chronic obstructive pulmonary disease) (HCC)   Seizure disorder (HCC)   Chronic pain syndrome   Hypothyroid   Hypertension   AKI (acute kidney injury) ([place])   Hypokalemia    This patient was discussed with the ED physician, including pertinent vitals, physical exam findings, labs, and imaging.  We also discussed care given by the ED provider.  1. Severe sepsis with septic shock a. Admit to ICU b. Blood cultures pending c. Uncertain of patient's primary source i. Of note, the patient's tracheal aspirate from last hospitalization grew out MRSA.  Patient was placed on antibiotics.  No worsening cough, shortness of breath. d. Continue  broad-spectrum antibiotics e. We will obtain procalcitonin and repeat lactic acid f. We will reevaluate blood pressure after fluid bolus g. Repeat CBC in the morning 2. AKI a. Repeat creatinine in the morning b. To need fluid bolus 3. Seizure disorder a. Currently though evidence of seizure 4. Hypokalemia a. Will replace potassium and IV fluids and repeat potassium in the morning 5. COPD a. Mild wheezing on exam appears to be compensated 6. Chronic pain a. At the present time, his symptoms appear to be associated with some degree of opiate  overdose.  Will hold opiates for now   DVT prophylaxis: Lovenox Consultants: None Code Status: Full code Family Communication: Husband in the room Disposition Plan: Pending   Azizah Lisle [place] [facility] Pager [phone]  If 7PM-7AM, please contact night-coverage [email or web address] Password [email or web address]

## 2018-01-27 NOTE — ED Provider Notes (Signed)
[facility] EMERGENCY DEPARTMENT Provider Note   CSN: [id number] Arrival date & time: 01/27/18  1936     History   Chief Complaint Chief Complaint  Patient presents with  . Loss of Consciousness    unsresponsive    HPI Tasha Clarke is a 67 y.o. female.  HPI Patient was recently admitted for gastroenteritis and status epilepticus.  Was on Keppra and Vimpat and intubated for a brief period of time.  Discharged 3 days ago.  Seen by her primary physician yesterday and was doing well.  Husband states that she was outside without her oxygen and he noticed that her oxygen saturations were in the 50s.  He replaced her on her oxygen and she became increasingly lethargic.  Brought the patient into the emergency department.  States she had a syncopal episode while trying to get her out of the car.  Denies any head or neck injury.  Is unaware whether the patient took any medication prior to this.  No witnessed seizure-like episode. Past Medical History:  Diagnosis Date  . Allergy   . Anemia   . Anxiety   . Arthritis   . Asthma   . Blood transfusion without reported diagnosis   . Cataract   . CHF (congestive heart failure) ([place])   . Chronic kidney disease   . Clotting disorder ([place])   . COPD (chronic obstructive pulmonary disease) ([place])   . Depression   . Diabetes mellitus without complication ([place])   . Emphysema of lung ([place])   . GERD (gastroesophageal reflux disease)   . Hyperlipidemia   . Hypertension   . Neuromuscular disorder ([place])   . Osteoporosis   . Oxygen deficiency   . Seizures ([place])   . Skin cancer    2 areas removed more than 10 years ago  . Sleep apnea 1985   wears cpap nightly  . Thyroid disease     Patient Active Problem List   Diagnosis Date Noted  . Acute respiratory failure ([place])   . Acute respiratory insufficiency   . Hypokalemia 01/17/2018  . Seizure ([place]) 01/17/2018  . Gastroenteritis 01/17/2018  . AKI (acute kidney injury) ([place]) 10/28/2017  .  Severe sepsis with septic shock ([place]) 10/28/2017  . Hypotension   . Iron deficiency anemia 08/25/2017  . COPD (chronic obstructive pulmonary disease) ([place]) 08/24/2017  . Hyperlipidemia LDL goal <100 08/24/2017  . Seizure disorder ([place]) 08/24/2017  . Depression, recurrent ([place]) 08/24/2017  . Osteoporosis 08/24/2017  . Chronic pain syndrome 08/24/2017  . Hypothyroid 08/24/2017  . Hypertension 08/24/2017    Past Surgical History:  Procedure Laterality Date  . APPENDECTOMY    . EYE SURGERY    . FRACTURE SURGERY    . heart stents    . JOINT REPLACEMENT    . SPINAL CORD STIMULATOR IMPLANT  2015  . SPINE SURGERY       OB History   None      Home Medications    Prior to Admission medications   Medication Sig Start Date End Date Taking? Authorizing Provider  albuterol (PROVENTIL HFA) 108 (90 Base) MCG/ACT inhaler Inhale into the lungs.   Yes [provider]  alendronate (FOSAMAX) 70 MG tablet Take 1 tablet (70 mg total) by mouth every Monday. Take with a full glass of water on an empty stomach. 12/05/17  Yes Dettinger, Fransisca Kaufmann, MD  aspirin EC 81 MG tablet Take 81 mg by mouth daily.   Yes [provider]  atorvastatin (LIPITOR) 40  MG tablet Take 40 mg by mouth daily.   Yes [provider]  cetirizine (ZYRTEC) 10 MG tablet Take 1 tablet (10 mg total) by mouth daily. 08/31/17  Yes Dettinger, Fransisca Kaufmann, MD  Cholecalciferol (VITAMIN D-3) 5000 units TABS Take 5,000 Units by mouth daily.   Yes [provider]  cyclobenzaprine (FLEXERIL) 10 MG tablet Take 1 tablet (10 mg total) by mouth 2 (two) times daily as needed for muscle spasms. 01/26/18  Yes Dettinger, Fransisca Kaufmann, MD  diphenhydramine-acetaminophen (TYLENOL PM EXTRA STRENGTH) 25-500 MG TABS tablet Take 1 tablet by mouth at bedtime as needed (pain).    Yes [provider]  doxycycline (VIBRA-TABS) 100 MG tablet Take 1 tablet (100 mg total) by mouth every 12 (twelve) hours. 01/24/18  Yes Patrecia Pour, MD  DULoxetine (CYMBALTA) 30 MG capsule Take 30 mg by mouth daily.   Yes [provider]  EPINEPHrine 0.3 mg/0.3 mL IJ SOAJ injection Inject 0.3 mg into the muscle as needed.   Yes [provider]  Ferrous Sulfate (IRON) 325 (65 Fe) MG TABS Take 1 tablet (325 mg total) by mouth daily. 08/27/17  Yes Velvet Bathe, MD  fluticasone (FLONASE) 50 MCG/ACT nasal spray Place 2 sprays into both nostrils daily. 08/31/17  Yes Dettinger, Fransisca Kaufmann, MD  fluticasone furoate-vilanterol (BREO ELLIPTA) 200-25 MCG/INH AEPB Inhale 1 puff into the lungs daily. 08/31/17  Yes Dettinger, Fransisca Kaufmann, MD  Fluticasone-Salmeterol (ADVAIR) 250-50 MCG/DOSE AEPB Inhale 1 puff into the lungs 2 (two) times daily.   Yes [provider]  gabapentin (NEURONTIN) 600 MG tablet Take 1 tablet (600 mg total) by mouth 4 (four) times daily. 10/26/17  Yes Dettinger, Fransisca Kaufmann, MD  ipratropium-albuterol (DUONEB) 0.5-2.5 (3) MG/3ML SOLN Take 3 mLs by nebulization every 6 (six) hours as needed (shortness of breathe).    Yes [provider]  lacosamide (VIMPAT) 50 MG TABS tablet Take 2 tablets (100 mg total) by mouth 2 (two) times daily for 1 day, THEN 1 tablet (50 mg total) 2 (two) times daily for 1 day. 01/25/18 01/27/18 Yes Patrecia Pour, MD  levETIRAcetam (KEPPRA) 1000 MG tablet TAKE 1 TABLET BY MOUTH AT BEDTIME 12/28/17  Yes Dettinger, Fransisca Kaufmann, MD  levothyroxine (SYNTHROID, LEVOTHROID) 50 MCG tablet Take 50 mcg by mouth daily before breakfast.   Yes [provider]  losartan (COZAAR) 100 MG tablet TAKE 1 2 (ONE HALF) TABLET BY MOUTH ONCE DAILY 01/02/18  Yes [provider]  Magnesium 400 MG CAPS Take 400 mg by mouth daily.   Yes [provider]  Magnesium Gluconate 550 MG TABS Take 1 tablet (550 mg total) by mouth daily. 01/27/18  Yes Dettinger, Fransisca Kaufmann, MD  metoprolol tartrate (LOPRESSOR) 25 MG tablet Take 25 mg by mouth 2 (two) times daily.   Yes [provider]    Morphine-Naltrexone (EMBEDA) 30-1.2 MG CPCR Take 1 capsule by mouth 2 (two) times daily.   Yes [provider]  Multiple Vitamin (MULTIVITAMIN) tablet Take 1 tablet by mouth daily.   Yes [provider]  naloxone (NARCAN) nasal spray 4 mg/0.1 mL Place into the nose. 08/18/17  Yes [provider]  Omega-3 Fatty Acids (FISH OIL) 1000 MG CAPS Take 1,000 mg by mouth daily.   Yes [provider]    Family History Family History  Problem Relation Age of Onset  . Arthritis Mother   . Asthma Mother   . Cancer Mother        lung  .  Depression Mother   . Diabetes Mother   . Arthritis Father   . Asthma Father   . Depression Father   . Asthma Sister   . Depression Sister   . Diabetes Sister   . Arthritis Maternal Grandmother   . Asthma Maternal Grandmother   . Depression Maternal Grandmother   . Arthritis Maternal Grandfather   . Asthma Maternal Grandfather   . Arthritis Paternal Grandmother   . Asthma Paternal Grandmother   . Arthritis Paternal Grandfather   . Asthma Paternal Grandfather   . COPD Paternal Grandfather   . Diabetes Sister   . Mental illness Sister   . Anemia Sister   . Hypertension Son   . Post-traumatic stress disorder Son   . Allergies Daughter     Social History Social History   Tobacco Use  . Smoking status: Current Every Day Smoker    Packs/day: 0.75    Years: 43.00    Pack years: 32.25    Types: Cigarettes  . Smokeless tobacco: Never Used  Substance Use Topics  . Alcohol use: No    Frequency: Never  . Drug use: No     Allergies   Cephalexin; Ketorolac tromethamine; [place] bov[place]; Imdur  [isosorbide dinitrate]; Iodinated diagnostic agents; Other; Sodium bicarbonate-citric acid; and Milk-related compounds   Review of Systems Review of Systems  Unable to perform ROS: Mental status change     Physical Exam Updated Vital Signs BP (!) 69/45   Pulse 77   Temp (!) 101.1 F (38.4 C)   Resp (!) 21   SpO2 93%    Physical Exam  Constitutional: She appears well-developed and well-nourished. No distress.  HENT:  Head: Normocephalic and atraumatic.  Mouth/Throat: Oropharynx is clear and moist.  No obvious head injury.  No intraoral trauma.  Eyes: Pupils are equal, round, and reactive to light. EOM are normal.  Pupils are 3 mm and sluggish bilaterally.  Neck: Normal range of motion. Neck supple. No JVD present.  Cardiovascular: Normal rate and regular rhythm. Exam reveals no gallop and no friction rub.  No murmur heard. Pulmonary/Chest: Effort normal. No stridor. No respiratory distress. She has no wheezes. She has rales. She exhibits no tenderness.  Few scattered rhonchi  Abdominal: Soft. Bowel sounds are normal. There is no tenderness. There is no rebound and no guarding.  Musculoskeletal: Normal range of motion. She exhibits no edema or tenderness.  No lower extremity swelling, asymmetry or tenderness.  Lymphadenopathy:    She has no cervical adenopathy.  Neurological:  Patient is lethargic.  Not answering questions or following commands.  Skin: Skin is warm and dry. Capillary refill takes less than 2 seconds. No rash noted. She is not diaphoretic. No erythema.  Nursing note and vitals reviewed.    ED Treatments / Results  Labs (all labs ordered are listed, but only abnormal results are displayed) Labs Reviewed  CBC WITH DIFFERENTIAL/PLATELET - Abnormal; Notable for the following components:      Result Value   WBC 23.3 (*)    RDW 17.7 (*)    Platelets 418 (*)    Neutro Abs 19.7 (*)    Monocytes Absolute 1.6 (*)    All other components within normal limits  COMPREHENSIVE METABOLIC PANEL - Abnormal; Notable for the following components:   Potassium 3.2 (*)    Chloride 91 (*)    Glucose, Bld 128 (*)    BUN 34 (*)    Creatinine, Ser 3.60 (*)    Calcium 8.6 (*)  Albumin 3.4 (*)    GFR calc non Af Amer 12 (*)    GFR calc Af Amer 14 (*)    Anion gap 19 (*)    All other  components within normal limits  RAPID URINE DRUG SCREEN, HOSP PERFORMED - Abnormal; Notable for the following components:   Opiates POSITIVE (*)    Barbiturates   (*)    Value: Result not available. Reagent lot number recalled by manufacturer.   All other components within normal limits  URINALYSIS, ROUTINE W REFLEX MICROSCOPIC - Abnormal; Notable for the following components:   Color, Urine AMBER (*)    APPearance CLOUDY (*)    Bilirubin Urine SMALL (*)    Protein, ur 30 (*)    Bacteria, UA RARE (*)    All other components within normal limits  CBG MONITORING, ED - Abnormal; Notable for the following components:   Glucose-Capillary 130 (*)    All other components within normal limits  I-STAT CG4 LACTIC ACID, ED - Abnormal; Notable for the following components:   Lactic Acid, Venous 4.98 (*)    All other components within normal limits  CULTURE, BLOOD (ROUTINE X 2)  CULTURE, BLOOD (ROUTINE X 2)  MRSA PCR SCREENING  TROPONIN I  ETHANOL  AMMONIA  BLOOD GAS, ARTERIAL  CK  LACTIC ACID, PLASMA  LACTIC ACID, PLASMA  PROCALCITONIN  PROCALCITONIN    EKG EKG Interpretation  Date/Time:  Friday January 27 2018 19:51:22 EDT Ventricular Rate:  89 PR Interval:    QRS Duration: 93 QT Interval:  382 QTC Calculation: 465 R Axis:   44 Text Interpretation:  Sinus rhythm Short PR interval Probable left atrial enlargement Borderline ST depression, lateral leads Confirmed by Julianne Rice [phone]) on 01/27/2018 10:03:54 PM   Radiology Dg Wrist Complete Left  Result Date: 01/26/2018 CLINICAL DATA:  Status post fall. EXAM: LEFT WRIST - COMPLETE 3+ VIEW COMPARISON:  None. FINDINGS: Generalized osteopenia. No acute fracture or subluxation. Severe osteoarthritis of the first [facility] joint. Mild osteoarthritis of the scaphotrapeziotrapezoid joint. Soft tissues are normal. IMPRESSION: No acute osseous injury of the left wrist. Electronically Signed   By: Kathreen Devoid   On: 01/26/2018 15:13   Ct Head  Wo Contrast  Result Date: 01/27/2018 CLINICAL DATA:  Hypoxia, syncope. History of hypertension, hyperlipidemia, diabetes, seizure. EXAM: CT HEAD WITHOUT CONTRAST TECHNIQUE: Contiguous axial images were obtained from the base of the skull through the vertex without intravenous contrast. COMPARISON:  CT HEAD January 21, 2018 and CT HEAD January 18, 2018 FINDINGS: BRAIN: No intraparenchymal hemorrhage, mass effect nor midline shift. The ventricles and sulci are normal for age. No acute large vascular territory infarcts. No abnormal extra-axial fluid collections. Basal cisterns are patent. VASCULAR: Mild calcific atherosclerosis of the carotid siphons. SKULL: No skull fracture. Osteopenia. No significant scalp soft tissue swelling. SINUSES/ORBITS: The mastoid air-cells and included paranasal sinuses are well-aerated. RIGHT frontal osteoma. The included ocular globes and orbital contents are non-suspicious. OTHER: None. IMPRESSION: Stable negative non-contrast CT HEAD for age. Electronically Signed   By: Elon Alas M.D.   On: 01/27/2018 21:14   Dg Chest Port 1 View  Result Date: 01/27/2018 CLINICAL DATA:  Hypoxia EXAM: PORTABLE CHEST 1 VIEW COMPARISON:  01/22/2018 FINDINGS: Interval extubation. Lower cervical plate and screw fixator. Dorsal column stimulator noted. Atherosclerotic calcification of the aortic arch. Low lung volumes are present, causing crowding of the pulmonary vasculature. Mild enlargement of the cardiopericardial silhouette with indistinctness of the pulmonary vasculature suggesting pulmonary venous hypertension.  Mild bilateral airway thickening. IMPRESSION: 1. Airway thickening is present, suggesting bronchitis or reactive airways disease. 2. Mild enlargement of the cardiopericardial silhouette with pulmonary venous hypertension. 3.  Atherosclerotic calcification of the aortic arch. 4. Interval extubation. Electronically Signed   By: Van Clines M.D.   On: 01/27/2018 20:28     Procedures Procedures (including critical care time)  Medications Ordered in ED Medications  sodium chloride 0.9 % bolus 1,000 mL (1,000 mLs Intravenous New Bag/Given 01/27/18 2135)  levofloxacin (LEVAQUIN) IVPB 750 mg (750 mg Intravenous New Bag/Given 01/27/18 2116)  vancomycin (VANCOCIN) IVPB 1000 mg/200 mL premix (has no administration in time range)  vancomycin (VANCOCIN) 500 mg in sodium chloride 0.9 % 100 mL IVPB (has no administration in time range)  aztreonam (AZACTAM) 1 g in sodium chloride 0.9 % 100 mL IVPB (has no administration in time range)  levofloxacin (LEVAQUIN) IVPB 500 mg (has no administration in time range)  vancomycin (VANCOCIN) IVPB 1000 mg/200 mL premix (has no administration in time range)  sodium chloride 0.9 % bolus 1,400 mL (1,400 mLs Intravenous New Bag/Given 01/27/18 2156)  acetaminophen (TYLENOL) suppository 650 mg (650 mg Rectal Given 01/27/18 1958)  naloxone [facility]) injection 1 mg (1 mg Intravenous Given 01/27/18 2036)  aztreonam (AZACTAM) 2 g in sodium chloride 0.9 % 100 mL IVPB (2 g Intravenous New Bag/Given 01/27/18 2116)  naloxone [facility]) injection 1 mg (1 mg Intravenous Given 01/27/18 2149)    CRITICAL CARE Performed by: Julianne Rice Total critical care time: 40 minutes Critical care time was exclusive of separately billable procedures and treating other patients. Critical care was necessary to treat or prevent imminent or life-threatening deterioration. Critical care was time spent personally by me on the following activities: development of treatment plan with patient and/or surrogate as well as nursing, discussions with consultants, evaluation of patient's response to treatment, examination of patient, obtaining history from patient or surrogate, ordering and performing treatments and interventions, ordering and review of laboratory studies, ordering and review of radiographic studies, pulse oximetry and re-evaluation of patient's  condition. Initial Impression / Assessment and Plan / ED Course  I have reviewed the triage vital signs and the nursing notes.  Pertinent labs & imaging results that were available during my care of the patient were reviewed by me and considered in my medical decision making (see chart for details).     Patient's blood pressure was initially normal and then dropped to a systolic in the 69C.  Maintain heart rate in the 60s and 70s.  Noted to have a temperature of 103.  Given dose of Narcan with improvement of patient's mental status as well as her blood pressure.  Initiated broad-spectrum antibiotics and IV fluids. Sepsis reassessment complete.  Patient is now mentating well.  Continues to have low blood pressure.  Receiving 30 cc/kg of IV fluids.  Discussed with hospitalist who saw patient in the emergency department.  Patient is aware that she may need a central line.  She is refusing placement of central line.  She appears competent to make this decision.  We will continue IV fluids and monitor blood pressure.  If she remains hypertensive may need pressors.  Signed out to Dr. Kathrynn Humble who will continue to monitor. Final Clinical Impressions(s) / ED Diagnoses   Final diagnoses:  Severe sepsis ([place])  Opioid overdose, accidental or unintentional, initial encounter [facility])    ED Discharge Orders    None       Julianne Rice, MD 01/27/18 2204

## 2018-01-27 NOTE — ED Triage Notes (Signed)
Husband bringing pt to ED due to low 02 when he got home. Pt chronically on 2L  at all times. He states pt did not have 02 on when he got home. Pt had syncopal episode in parking lot but was laid down. No fall.pt arrived with only response to hard sternal rub but only opened eyes. Pt 02 95% upon arrival. Color wnl. Warm to touch.

## 2018-01-27 NOTE — ED Notes (Signed)
CRITICAL VALUE ALERT  Critical Value:  Lactic acid 4.98  Date & Time Notied:  01/27/2018 2057  Provider Notified: [email or web address]  Orders Received/Actions taken: md notified

## 2018-01-27 NOTE — Progress Notes (Signed)
Patient ID: Tasha Clarke, female   DOB: June 05, 1951, 67 y.o.   MRN: [id number]   Despite fluid resuscitation, patient still hypotensive. Will have EDP place central line and start levophed.   Truett Mainland, DO 01/27/2018 10:31 PM

## 2018-01-27 NOTE — ED Notes (Signed)
Dr Lita Mains made aware that patient's BP was low on monitor. Also Charge TIna, RN Brooke made aware. Took BP with manual cuff BP is 62/48 at this time.

## 2018-01-27 NOTE — ED Notes (Signed)
Dr Lita Mains made aware of patient's rectal temp.

## 2018-01-28 ENCOUNTER — Encounter (HOSPITAL_COMMUNITY): Payer: Self-pay

## 2018-01-28 ENCOUNTER — Inpatient Hospital Stay (HOSPITAL_COMMUNITY): Payer: Medicare PPO

## 2018-01-28 ENCOUNTER — Other Ambulatory Visit: Payer: Self-pay

## 2018-01-28 LAB — PROCALCITONIN
PROCALCITONIN: 0.45 ng/mL
Procalcitonin: 0.57 ng/mL

## 2018-01-28 LAB — CBC
HCT: 36.4 % (ref 36.0–46.0)
Hemoglobin: 11.2 g/dL — ABNORMAL LOW (ref 12.0–15.0)
MCH: 29.3 pg (ref 26.0–34.0)
MCHC: 30.8 g/dL (ref 30.0–36.0)
MCV: 95.3 fL (ref 78.0–100.0)
Platelets: 387 10*3/uL (ref 150–400)
RBC: 3.82 MIL/uL — AB (ref 3.87–5.11)
RDW: 17.6 % — ABNORMAL HIGH (ref 11.5–15.5)
WBC: 23.6 10*3/uL — ABNORMAL HIGH (ref 4.0–10.5)

## 2018-01-28 LAB — BASIC METABOLIC PANEL
Anion gap: 10 (ref 5–15)
BUN: 32 mg/dL — ABNORMAL HIGH (ref 8–23)
CHLORIDE: 98 mmol/L (ref 98–111)
CO2: 27 mmol/L (ref 22–32)
CREATININE: 2.41 mg/dL — AB (ref 0.44–1.00)
Calcium: 7.5 mg/dL — ABNORMAL LOW (ref 8.9–10.3)
GFR calc non Af Amer: 20 mL/min — ABNORMAL LOW (ref >60)
GFR, EST AFRICAN AMERICAN: 23 mL/min — AB (ref >60)
Glucose, Bld: 167 mg/dL — ABNORMAL HIGH (ref 70–99)
Potassium: 3.2 mmol/L — ABNORMAL LOW (ref 3.5–5.1)
Sodium: 135 mmol/L (ref 135–145)

## 2018-01-28 LAB — TROPONIN I: Troponin I: <0.03 ng/mL (ref <0.03)

## 2018-01-28 LAB — MAGNESIUM: Magnesium: 1.3 mg/dL — ABNORMAL LOW (ref 1.7–2.4)

## 2018-01-28 LAB — LACTIC ACID, PLASMA
LACTIC ACID, VENOUS: 1.2 mmol/L (ref 0.5–1.9)
Lactic Acid, Venous: 1.8 mmol/L (ref 0.5–1.9)

## 2018-01-28 MED ORDER — ASPIRIN EC 81 MG PO TBEC
81.0000 mg | DELAYED_RELEASE_TABLET | Freq: Every day | ORAL | Status: DC
  Administered 2018-01-28 – 2018-01-31 (×4): 81 mg via ORAL
  Filled 2018-01-28 (×4): qty 1

## 2018-01-28 MED ORDER — POTASSIUM CHLORIDE IN NACL 40-0.9 MEQ/L-% IV SOLN
INTRAVENOUS | Status: DC
  Administered 2018-01-28: 100 mL/h via INTRAVENOUS
  Administered 2018-01-28 – 2018-01-29 (×3): 125 mL/h via INTRAVENOUS

## 2018-01-28 MED ORDER — ONDANSETRON HCL 4 MG/2ML IJ SOLN: 4.0000 mg | Freq: Four times a day (QID) | INTRAMUSCULAR | Status: DC | PRN

## 2018-01-28 MED ORDER — LEVETIRACETAM 500 MG PO TABS
1000.0000 mg | ORAL_TABLET | Freq: Every day | ORAL | Status: DC
  Administered 2018-01-28 – 2018-01-30 (×4): 1000 mg via ORAL
  Filled 2018-01-28 (×4): qty 2

## 2018-01-28 MED ORDER — ONDANSETRON HCL 4 MG PO TABS: 4.0000 mg | ORAL_TABLET | Freq: Four times a day (QID) | ORAL | Status: DC | PRN

## 2018-01-28 MED ORDER — SODIUM CHLORIDE 0.9% FLUSH
3.0000 mL | Freq: Two times a day (BID) | INTRAVENOUS | Status: DC
  Administered 2018-01-28 – 2018-01-31 (×8): 3 mL via INTRAVENOUS

## 2018-01-28 MED ORDER — ATORVASTATIN CALCIUM 40 MG PO TABS
40.0000 mg | ORAL_TABLET | Freq: Every day | ORAL | Status: DC
  Administered 2018-01-28 – 2018-01-31 (×4): 40 mg via ORAL
  Filled 2018-01-28 (×4): qty 1

## 2018-01-28 MED ORDER — LEVALBUTEROL HCL 0.63 MG/3ML IN NEBU
0.6300 mg | INHALATION_SOLUTION | Freq: Four times a day (QID) | RESPIRATORY_TRACT | Status: DC
  Administered 2018-01-28 – 2018-01-30 (×11): 0.63 mg via RESPIRATORY_TRACT
  Filled 2018-01-28 (×11): qty 3

## 2018-01-28 MED ORDER — GUAIFENESIN ER 600 MG PO TB12
600.0000 mg | ORAL_TABLET | Freq: Two times a day (BID) | ORAL | Status: DC
  Administered 2018-01-28 – 2018-01-31 (×8): 600 mg via ORAL
  Filled 2018-01-28 (×8): qty 1

## 2018-01-28 MED ORDER — IPRATROPIUM-ALBUTEROL 0.5-2.5 (3) MG/3ML IN SOLN: 3.0000 mL | Freq: Four times a day (QID) | RESPIRATORY_TRACT | Status: DC | PRN

## 2018-01-28 MED ORDER — LORATADINE 10 MG PO TABS
10.0000 mg | ORAL_TABLET | Freq: Every day | ORAL | Status: DC
  Administered 2018-01-28 – 2018-01-31 (×4): 10 mg via ORAL
  Filled 2018-01-28 (×4): qty 1

## 2018-01-28 MED ORDER — SENNOSIDES-DOCUSATE SODIUM 8.6-50 MG PO TABS: 1.0000 | ORAL_TABLET | Freq: Every evening | ORAL | Status: DC | PRN

## 2018-01-28 MED ORDER — MOMETASONE FURO-FORMOTEROL FUM 200-5 MCG/ACT IN AERO
2.0000 | INHALATION_SPRAY | Freq: Two times a day (BID) | RESPIRATORY_TRACT | Status: DC
  Administered 2018-01-28 – 2018-01-31 (×7): 2 via RESPIRATORY_TRACT
  Filled 2018-01-28: qty 8.8

## 2018-01-28 MED ORDER — ACETAMINOPHEN 325 MG PO TABS
650.0000 mg | ORAL_TABLET | Freq: Four times a day (QID) | ORAL | Status: DC | PRN
  Administered 2018-01-28: 650 mg via ORAL
  Filled 2018-01-28: qty 2

## 2018-01-28 MED ORDER — FLUTICASONE FUROATE-VILANTEROL 200-25 MCG/INH IN AEPB
1.0000 | INHALATION_SPRAY | Freq: Every day | RESPIRATORY_TRACT | Status: DC
  Administered 2018-01-28 – 2018-01-31 (×4): 1 via RESPIRATORY_TRACT
  Filled 2018-01-28: qty 28

## 2018-01-28 MED ORDER — ORAL CARE MOUTH RINSE
15.0000 mL | Freq: Two times a day (BID) | OROMUCOSAL | Status: DC
  Administered 2018-01-28 – 2018-01-30 (×3): 15 mL via OROMUCOSAL

## 2018-01-28 MED ORDER — ALBUTEROL SULFATE (2.5 MG/3ML) 0.083% IN NEBU: 2.5000 mg | INHALATION_SOLUTION | RESPIRATORY_TRACT | Status: DC | PRN

## 2018-01-28 MED ORDER — ALBUTEROL SULFATE (2.5 MG/3ML) 0.083% IN NEBU
3.0000 mL | INHALATION_SOLUTION | RESPIRATORY_TRACT | Status: DC
  Administered 2018-01-28: 3 mL via RESPIRATORY_TRACT
  Filled 2018-01-28: qty 3

## 2018-01-28 MED ORDER — SODIUM CHLORIDE 0.9 % IV SOLN
INTRAVENOUS | Status: AC
  Filled 2018-01-28: qty 1

## 2018-01-28 MED ORDER — LEVOTHYROXINE SODIUM 50 MCG PO TABS
50.0000 ug | ORAL_TABLET | Freq: Every day | ORAL | Status: DC
  Administered 2018-01-28 – 2018-01-31 (×4): 50 ug via ORAL
  Filled 2018-01-28: qty 1
  Filled 2018-01-28 (×3): qty 2

## 2018-01-28 MED ORDER — POTASSIUM CHLORIDE CRYS ER 20 MEQ PO TBCR
20.0000 meq | EXTENDED_RELEASE_TABLET | Freq: Two times a day (BID) | ORAL | Status: DC
  Administered 2018-01-28 – 2018-01-29 (×5): 20 meq via ORAL
  Filled 2018-01-28 (×5): qty 1

## 2018-01-28 MED ORDER — GABAPENTIN 400 MG PO CAPS
400.0000 mg | ORAL_CAPSULE | Freq: Four times a day (QID) | ORAL | Status: DC
  Administered 2018-01-28 (×2): 400 mg via ORAL
  Filled 2018-01-28 (×2): qty 1

## 2018-01-28 MED ORDER — DIGOXIN 0.25 MG/ML IJ SOLN
0.2500 mg | Freq: Once | INTRAMUSCULAR | Status: AC
  Administered 2018-01-28: 0.25 mg via INTRAVENOUS
  Filled 2018-01-28: qty 2

## 2018-01-28 MED ORDER — MAGNESIUM SULFATE 4 GM/100ML IV SOLN
4.0000 g | Freq: Once | INTRAVENOUS | Status: AC
  Administered 2018-01-28: 4 g via INTRAVENOUS
  Filled 2018-01-28: qty 100

## 2018-01-28 MED ORDER — ENOXAPARIN SODIUM 30 MG/0.3ML ~~LOC~~ SOLN
30.0000 mg | SUBCUTANEOUS | Status: DC
  Administered 2018-01-28 – 2018-01-29 (×2): 30 mg via SUBCUTANEOUS
  Filled 2018-01-28 (×2): qty 0.3

## 2018-01-28 MED ORDER — GABAPENTIN 300 MG PO CAPS
600.0000 mg | ORAL_CAPSULE | Freq: Four times a day (QID) | ORAL | Status: DC
  Administered 2018-01-28 (×2): 600 mg via ORAL
  Filled 2018-01-28: qty 6
  Filled 2018-01-28: qty 2

## 2018-01-28 MED ORDER — ACETAMINOPHEN 650 MG RE SUPP: 650.0000 mg | Freq: Four times a day (QID) | RECTAL | Status: DC | PRN

## 2018-01-28 MED ORDER — CYCLOBENZAPRINE HCL 10 MG PO TABS: 10.0000 mg | ORAL_TABLET | Freq: Two times a day (BID) | ORAL | Status: DC | PRN

## 2018-01-28 MED ORDER — DULOXETINE HCL 30 MG PO CPEP
30.0000 mg | ORAL_CAPSULE | Freq: Every day | ORAL | Status: DC
  Administered 2018-01-28 – 2018-01-31 (×4): 30 mg via ORAL
  Filled 2018-01-28 (×4): qty 1

## 2018-01-28 NOTE — Progress Notes (Signed)
PROGRESS NOTE    Tasha Clarke  KKX:[id number]  DOB: Sep 09, 1950  DOA: 01/27/2018 PCP: Dettinger, Fransisca Kaufmann, MD   Brief Admission Hx: Tasha Clarke is a 67 y.o. female with a history of Seizure disorder, chronic kidney disease stage III, COPD, diabetes, sleep apnea: Thyroid disease.  Patient was recently hospitalized for gastroenteritis and status epilepticus.  She was on Keppra, Vimpat, and intubated for a brief period of time.  She transferred to [facility] due to being intubated due to concerns for aspiration and inability to protect her airway during seizure activity.  She was discharged 3 days ago and presented to [facility] with severe sepsis.    MDM/Assessment & Plan:   1. Severe Sepsis with shock - Lactate is trending down, Pt still requiring pressors.  She has a central line and remains on levophed titrated to keep MAP >65.  Pt is clinically feeling better.   Continue IV fluids. Continue IV antibiotics.  No source of infection has been determined but I suspect patient has MRSA pneumonia from prolonged intubation and coughing spells.  Could also have aspiration pneumonia.   2. AKI - Prerenal from severe sepsis and dehydration, treating with IV fluids. Follow BMP.  3. Epilepsy - no recent seizures, maintain on seizure precautions, continue home antiepileptics.   4. Hypokalemia - Replacement ordered, ordered for additional magnesium.  Follow. 5. Hypomagnesemia - IV replacement ordered.   6. COPD - nebs ordered, does not seem to be in acute exacerbation at this time.  7. Chronic pain on opioids - holding narcotics at this time.   8. Leukocytosis - Follow CBC.   9. Question of gastric mass - noted on CT abdomen 7/9, when medically stabilized would consult GI.   10. Hypothyroidism - resumed home levothyroxine, would not check TSH in setting of critical illness.   DVT prophylaxis: lovenox Code Status: Full Family Communication: patient Disposition Plan: ICU    Subjective: Pt awake, says she feels better, eating and drinking, no seizures  Objective: Vitals:   01/28/18 0600 01/28/18 0615 01/28/18 0630 01/28/18 0851  BP: (!) 122/54 (!) 130/58 (!) 118/57   Pulse: 65 65 65   Resp: (!) 9 (!) 9 13   Temp:      TempSrc:      SpO2: 99% 96% 96% 96%  Weight:      Height:        Intake/Output Summary (Last 24 hours) at 01/28/2018 0926 Last data filed at 01/28/2018 1696 Gross per 24 hour  Intake 4469.66 ml  Output 575 ml  Net 3894.66 ml   Filed Weights   01/28/18 0105 01/28/18 0316  Weight: 86 kg (189 lb 9.5 oz) 86 kg (189 lb 9.5 oz)   REVIEW OF SYSTEMS  As per history otherwise all reviewed and reported negative  Exam:  General exam: awake, alert, no distress, cooperative.  Respiratory system: rare expiratory wheezes, no crackles heard.  No increased work of breathing. Cardiovascular system: S1 & S2 heard, RRR. No JVD, murmurs, gallops, clicks or pedal edema. Gastrointestinal system: Abdomen is nondistended, soft and nontender. Normal bowel sounds heard. Central nervous system: Alert and oriented. No focal neurological deficits. Extremities: no Cyanosis or pitting edema.  Data Reviewed: Basic Metabolic Panel: Recent Labs  Lab 01/22/18 0439 01/23/18 0543 01/24/18 0350 01/26/18 0931 01/27/18 1945 01/28/18 0323  NA 140 140 141 142 136 135  K 4.0 3.6 3.7 4.7 3.2* 3.2*  CL 104 96* 97* 92* 91* 98  CO2 27  33* 29 30* 26 27  GLUCOSE 158* 111* 94 117* 128* 167*  BUN 7* 8 14 21  34* 32*  CREATININE 0.60 0.64 0.69 1.16* 3.60* 2.41*  CALCIUM 9.4 9.3 9.2 10.3 8.6* 7.5*  MG 1.3* 1.1* 1.2* 1.3*  --  1.3*  PHOS 3.8 4.6  --   --   --   --    Liver Function Tests: Recent Labs  Lab 01/26/18 0931 01/27/18 1945  AST 34 29  ALT 31 25  ALKPHOS 83 69  BILITOT <0.2 0.5  PROT 7.2 7.8  ALBUMIN 3.8 3.4*   No results for input(s): LIPASE, AMYLASE in the last 168 hours. Recent Labs  Lab 01/27/18 2004  AMMONIA 21   CBC: Recent Labs   Lab 01/22/18 0439 01/23/18 0543 01/26/18 0931 01/27/18 1945 01/28/18 0323  WBC 13.3* 9.6 13.1* 23.3* 23.6*  NEUTROABS  --   --  9.7* 19.7*  --   HGB 12.0 12.0 13.8 13.2 11.2*  HCT 38.8 38.7 42.9 42.0 36.4  MCV 93.7 91.9 93 94.8 95.3  PLT 234 232 394 418* 387   Cardiac Enzymes: Recent Labs  Lab 01/27/18 1945 01/27/18 2342  CKTOTAL 223  --   TROPONINI <0.03 <0.03   CBG (last 3)  Recent Labs    01/27/18 1947  GLUCAP 130*   Recent Results (from the past 240 hour(s))  Gastrointestinal Panel by PCR , Stool     Status: None   Collection Time: 01/19/18  3:00 PM  Result Value Ref Range Status   Campylobacter species NOT DETECTED NOT DETECTED Final   Plesimonas shigelloides NOT DETECTED NOT DETECTED Final   Salmonella species NOT DETECTED NOT DETECTED Final   Yersinia enterocolitica NOT DETECTED NOT DETECTED Final   Vibrio species NOT DETECTED NOT DETECTED Final   Vibrio cholerae NOT DETECTED NOT DETECTED Final   Enteroaggregative E coli (EAEC) NOT DETECTED NOT DETECTED Final   Enteropathogenic E coli (EPEC) NOT DETECTED NOT DETECTED Final   Enterotoxigenic E coli (ETEC) NOT DETECTED NOT DETECTED Final   Shiga like toxin producing E coli (STEC) NOT DETECTED NOT DETECTED Final   Shigella/Enteroinvasive E coli (EIEC) NOT DETECTED NOT DETECTED Final   Cryptosporidium NOT DETECTED NOT DETECTED Final   Cyclospora cayetanensis NOT DETECTED NOT DETECTED Final   Entamoeba histolytica NOT DETECTED NOT DETECTED Final   Giardia lamblia NOT DETECTED NOT DETECTED Final   Adenovirus F40/41 NOT DETECTED NOT DETECTED Final   Astrovirus NOT DETECTED NOT DETECTED Final   Norovirus GI/GII NOT DETECTED NOT DETECTED Final   Rotavirus A NOT DETECTED NOT DETECTED Final   Sapovirus (I, II, IV, and V) NOT DETECTED NOT DETECTED Final    Comment: Performed at [facility], [address] [place], [place] [address]  Culture, blood (routine x 2)     Status: None   Collection Time: 01/21/18   5:40 PM  Result Value Ref Range Status   Specimen Description BLOOD RIGHT HAND  Final   Special Requests   Final    BOTTLES DRAWN AEROBIC AND ANAEROBIC Blood Culture adequate volume   Culture   Final    NO GROWTH 5 DAYS Performed at [facility], [address] [place],  [address]    Report Status 01/26/2018 FINAL  Final  Culture, blood (routine x 2)     Status: None   Collection Time: 01/21/18  5:41 PM  Result Value Ref Range Status   Specimen Description BLOOD RIGHT HAND  Final  Special Requests   Final    BOTTLES DRAWN AEROBIC AND ANAEROBIC Blood Culture adequate volume   Culture   Final    NO GROWTH 5 DAYS Performed at [facility], [place] [address] [place], [place] [address]    Report Status 01/26/2018 FINAL  Final  Culture, respiratory (NON-Expectorated)     Status: None   Collection Time: 01/22/18 10:08 AM  Result Value Ref Range Status   Specimen Description TRACHEAL ASPIRATE  Final   Special Requests NONE  Final   Gram Stain   Final    ABUNDANT WBC PRESENT, PREDOMINANTLY PMN ABUNDANT GRAM POSITIVE COCCI Performed at [facility], [place] [address] [place], [place] [address]    Culture   Final    MODERATE METHICILLIN RESISTANT STAPHYLOCOCCUS AUREUS   Report Status 01/24/2018 FINAL  Final   Organism ID, Bacteria METHICILLIN RESISTANT STAPHYLOCOCCUS AUREUS  Final      Susceptibility   Methicillin resistant staphylococcus aureus - MIC*    CIPROFLOXACIN >=8 RESISTANT Resistant     ERYTHROMYCIN >=8 RESISTANT Resistant     GENTAMICIN <=0.5 SENSITIVE Sensitive     OXACILLIN >=4 RESISTANT Resistant     TETRACYCLINE 2 SENSITIVE Sensitive     VANCOMYCIN 1 SENSITIVE Sensitive     TRIMETH/SULFA <=10 SENSITIVE Sensitive     CLINDAMYCIN >=8 RESISTANT Resistant     RIFAMPIN <=0.5 SENSITIVE Sensitive     Inducible Clindamycin NEGATIVE Sensitive     * MODERATE METHICILLIN RESISTANT STAPHYLOCOCCUS AUREUS  Culture, blood (Routine X 2) w Reflex to ID Panel      Status: None (Preliminary result)   Collection Time: 01/27/18  7:45 PM  Result Value Ref Range Status   Specimen Description BLOOD  Final   Special Requests NONE  Final   Culture   Final    NO GROWTH < 12 HOURS Performed at [facility], [address], [place], [place] [address]    Report Status PENDING  Incomplete  Culture, blood (Routine X 2) w Reflex to ID Panel     Status: None (Preliminary result)   Collection Time: 01/27/18  8:35 PM  Result Value Ref Range Status   Specimen Description BLOOD LEFT HAND  Final   Special Requests   Final    BOTTLES DRAWN AEROBIC AND ANAEROBIC Blood Culture adequate volume   Culture   Final    NO GROWTH < 12 HOURS Performed at [facility], [address] [place],  [address]    Report Status PENDING  Incomplete     Studies: Dg Wrist Complete Left  Result Date: 01/26/2018 CLINICAL DATA:  Status post fall. EXAM: LEFT WRIST - COMPLETE 3+ VIEW COMPARISON:  None. FINDINGS: Generalized osteopenia. No acute fracture or subluxation. Severe osteoarthritis of the first [facility] joint. Mild osteoarthritis of the scaphotrapeziotrapezoid joint. Soft tissues are normal. IMPRESSION: No acute osseous injury of the left wrist. Electronically Signed   By: Kathreen Devoid   On: 01/26/2018 15:13   Ct Head Wo Contrast  Result Date: 01/27/2018 CLINICAL DATA:  Hypoxia, syncope. History of hypertension, hyperlipidemia, diabetes, seizure. EXAM: CT HEAD WITHOUT CONTRAST TECHNIQUE: Contiguous axial images were obtained from the base of the skull through the vertex without intravenous contrast. COMPARISON:  CT HEAD January 21, 2018 and CT HEAD January 18, 2018 FINDINGS: BRAIN: No intraparenchymal hemorrhage, mass effect nor midline shift. The ventricles and sulci are normal for age. No acute large vascular territory infarcts. No abnormal extra-axial fluid collections. Basal cisterns are patent.  VASCULAR: Mild calcific atherosclerosis of the carotid siphons. SKULL: No skull  fracture. Osteopenia. No significant scalp soft tissue swelling. SINUSES/ORBITS: The mastoid air-cells and included paranasal sinuses are well-aerated. RIGHT frontal osteoma. The included ocular globes and orbital contents are non-suspicious. OTHER: None. IMPRESSION: Stable negative non-contrast CT HEAD for age. Electronically Signed   By: Elon Alas M.D.   On: 01/27/2018 21:14   Dg Chest Port 1 View  Result Date: 01/28/2018 CLINICAL DATA:  67 year old female with central line placement EXAM: PORTABLE CHEST 1 VIEW COMPARISON:  Chest radiograph dated 01/27/2018 FINDINGS: There has been interval placement of a right IJ central line the tip over the right atrium close to the cavoatrial junction. There is cardiomegaly. No focal consolidative changes, pleural effusion, or pneumothorax. Lower cervical fixation hardware. No acute osseous pathology. IMPRESSION: Interval placement of a right IJ central venous line with tip over the right atrium close to the cavoatrial junction. No pneumothorax. No other interval change. Electronically Signed   By: Anner Crete M.D.   On: 01/28/2018 00:41   Dg Chest Port 1 View  Result Date: 01/27/2018 CLINICAL DATA:  Hypoxia EXAM: PORTABLE CHEST 1 VIEW COMPARISON:  01/22/2018 FINDINGS: Interval extubation. Lower cervical plate and screw fixator. Dorsal column stimulator noted. Atherosclerotic calcification of the aortic arch. Low lung volumes are present, causing crowding of the pulmonary vasculature. Mild enlargement of the cardiopericardial silhouette with indistinctness of the pulmonary vasculature suggesting pulmonary venous hypertension. Mild bilateral airway thickening. IMPRESSION: 1. Airway thickening is present, suggesting bronchitis or reactive airways disease. 2. Mild enlargement of the cardiopericardial silhouette with pulmonary venous hypertension. 3.  Atherosclerotic calcification of the aortic arch. 4. Interval extubation. Electronically Signed   By: Van Clines M.D.   On: 01/27/2018 20:28   Scheduled Meds: . aspirin EC  81 mg Oral Daily  . atorvastatin  40 mg Oral Daily  . DULoxetine  30 mg Oral Daily  . enoxaparin (LOVENOX) injection  30 mg Subcutaneous Q24H  . fluticasone furoate-vilanterol  1 puff Inhalation Daily  . gabapentin  600 mg Oral QID  . guaiFENesin  600 mg Oral BID  . levalbuterol  0.63 mg Nebulization Q6H  . levETIRAcetam  1,000 mg Oral QHS  . levothyroxine  50 mcg Oral QAC breakfast  . loratadine  10 mg Oral Daily  . mouth rinse  15 mL Mouth Rinse BID  . mometasone-formoterol  2 puff Inhalation BID  . potassium chloride  20 mEq Oral BID  . sodium chloride flush  3 mL Intravenous Q12H   Continuous Infusions: . 0.9 % NaCl with KCl 40 mEq / L 100 mL/hr (01/28/18 0139)  . aztreonam Stopped (01/28/18 0334)  . [START ON 01/29/2018] levofloxacin (LEVAQUIN) IV    . norepinephrine (LEVOPHED) Adult infusion 10 mcg/min (01/28/18 0631)  . [START ON 01/29/2018] vancomycin      Principal Problem:   Severe sepsis with septic shock (HCC) Active Problems:   COPD (chronic obstructive pulmonary disease) (HCC)   Seizure disorder (HCC)   Chronic pain syndrome   Hypothyroid   Hypertension   AKI (acute kidney injury) ([place])   Hypokalemia   Critical Care Time spent: 40 mins  Irwin Brakeman, MD, FAAFP [facility] Pager [phone]  If 7PM-7AM, please contact night-coverage [email or web address] Password [email or web address] 01/28/2018, 9:26 AM    LOS: 1 day

## 2018-01-28 NOTE — ED Notes (Signed)
Change in hr and rhythm noted, ekg done and dr.shah made aware. Pt denies cp.

## 2018-01-28 NOTE — Progress Notes (Addendum)
Heart rate increased again to 130 's down to 121 after albuterol neb will stop scheduled q4 albuterol and change to xopenex 0.63 at q6. Patient placed on auto BiPAP 10/5 3lpm running in.to circuit. Have stopped all albuterol containing nebs for now.

## 2018-01-28 NOTE — Progress Notes (Signed)
EKG noted with junctional tachycardia at 126 bpm.  Will administer 1 dose of IV digoxin 250 mcg.  Map noted to be 64 on Levophed.  Will be transferred to ICU soon.

## 2018-01-29 LAB — COMPREHENSIVE METABOLIC PANEL
ALBUMIN: 2.3 g/dL — AB (ref 3.5–5.0)
ALK PHOS: 62 U/L (ref 38–126)
ALT: 24 U/L (ref 0–44)
AST: 27 U/L (ref 15–41)
Anion gap: 5 (ref 5–15)
BILIRUBIN TOTAL: 0.2 mg/dL — AB (ref 0.3–1.2)
BUN: 22 mg/dL (ref 8–23)
CALCIUM: 8.4 mg/dL — AB (ref 8.9–10.3)
CO2: 25 mmol/L (ref 22–32)
CREATININE: 0.72 mg/dL (ref 0.44–1.00)
Chloride: 108 mmol/L (ref 98–111)
GFR calc Af Amer: >60 mL/min (ref >60)
GFR calc non Af Amer: >60 mL/min (ref >60)
GLUCOSE: 99 mg/dL (ref 70–99)
Potassium: 5.1 mmol/L (ref 3.5–5.1)
Sodium: 138 mmol/L (ref 135–145)
TOTAL PROTEIN: 5.8 g/dL — AB (ref 6.5–8.1)

## 2018-01-29 LAB — CBC WITH DIFFERENTIAL/PLATELET
Basophils Absolute: 0 K/uL (ref 0.0–0.1)
Basophils Relative: 0 %
Eosinophils Absolute: 0.4 K/uL (ref 0.0–0.7)
Eosinophils Relative: 3 %
HCT: 32.2 % — ABNORMAL LOW (ref 36.0–46.0)
Hemoglobin: 9.5 g/dL — ABNORMAL LOW (ref 12.0–15.0)
Lymphocytes Relative: 7 %
Lymphs Abs: 0.9 K/uL (ref 0.7–4.0)
MCH: 28.7 pg (ref 26.0–34.0)
MCHC: 29.5 g/dL — ABNORMAL LOW (ref 30.0–36.0)
MCV: 97.3 fL (ref 78.0–100.0)
Monocytes Absolute: 1 K/uL (ref 0.1–1.0)
Monocytes Relative: 8 %
Neutro Abs: 9.8 K/uL — ABNORMAL HIGH (ref 1.7–7.7)
Neutrophils Relative %: 82 %
Platelets: 270 K/uL (ref 150–400)
RBC: 3.31 MIL/uL — ABNORMAL LOW (ref 3.87–5.11)
RDW: 16.9 % — ABNORMAL HIGH (ref 11.5–15.5)
WBC: 12.1 K/uL — ABNORMAL HIGH (ref 4.0–10.5)

## 2018-01-29 LAB — MAGNESIUM: Magnesium: 1.8 mg/dL (ref 1.7–2.4)

## 2018-01-29 LAB — PROCALCITONIN: Procalcitonin: 0.41 ng/mL

## 2018-01-29 MED ORDER — ENOXAPARIN SODIUM 40 MG/0.4ML ~~LOC~~ SOLN
40.0000 mg | SUBCUTANEOUS | Status: DC
  Administered 2018-01-30 – 2018-01-31 (×2): 40 mg via SUBCUTANEOUS
  Filled 2018-01-29 (×2): qty 0.4

## 2018-01-29 MED ORDER — SODIUM CHLORIDE 0.9 % IV SOLN
INTRAVENOUS | Status: DC
  Administered 2018-01-29 – 2018-01-30 (×3): via INTRAVENOUS

## 2018-01-29 MED ORDER — GABAPENTIN 300 MG PO CAPS
600.0000 mg | ORAL_CAPSULE | Freq: Four times a day (QID) | ORAL | Status: DC
  Administered 2018-01-29 – 2018-01-31 (×9): 600 mg via ORAL
  Filled 2018-01-29 (×2): qty 2
  Filled 2018-01-29: qty 6
  Filled 2018-01-29 (×5): qty 2
  Filled 2018-01-29: qty 6

## 2018-01-29 MED ORDER — LEVOFLOXACIN IN D5W 750 MG/150ML IV SOLN
750.0000 mg | INTRAVENOUS | Status: DC
  Administered 2018-01-29: 750 mg via INTRAVENOUS
  Filled 2018-01-29: qty 150

## 2018-01-29 NOTE — Progress Notes (Signed)
PROGRESS NOTE   [name]  OFB:[id number]  DOB: June 10, 1951  DOA: 01/27/2018 PCP: Dettinger, Fransisca Kaufmann, MD   Brief Admission Hx: Tasha Clarke is a 67 y.o. female with a history of Seizure disorder, chronic kidney disease stage III, COPD, diabetes, sleep apnea: Thyroid disease.  Patient was recently hospitalized for gastroenteritis and status epilepticus.  She was on Keppra, Vimpat, and intubated for a brief period of time.  She transferred to [facility] due to being intubated due to concerns for aspiration and inability to protect her airway during seizure activity.  She was discharged 3 days ago and presented to [facility] with severe sepsis.    MDM/Assessment & Plan:   1. Severe Sepsis with shock - Lactate is down, Pt still requiring pressors but they have been reduced and hopefully can discontinue today.   She has a central line and remains on levophed titrated to keep MAP >65.  Pt is clinically feeling better.   Continue IV fluids but reduce rate. Continue broad spectrum IV antibiotics because no source of infection has been determined but I suspect patient has MRSA pneumonia or aspiration from recent prolonged intubation and coughing spells. Repeat CXR in AM.   2. AKI - Resolved now.  This was prerenal azotemia from severe sepsis and dehydration, treating with IV fluids. Follow BMP.  3. Epilepsy - no recent seizures, maintain on seizure precautions, continue home antiepileptics.  With improved creatinine, will resume gabapentin at regular home doses that the neurologists had her on at recent discharge.  4. Hypokalemia - Repleted. Follow. 5. Hypomagnesemia - Repleted.  IV replacement ordered.   6. COPD - nebs ordered, does not seem to be in acute exacerbation at this time.  7. Chronic pain on opioids - holding narcotics and sedatives at this time while critically ill in ICU.   8. Leukocytosis - Improving WBC trending down.  Follow CBC.   9. Question of gastric mass - noted  on CT abdomen 7/9, when medically stabilized would consult GI.   10. Hypothyroidism - resumed home levothyroxine, would not check TSH in setting of critical illness.   DVT prophylaxis: lovenox Code Status: Full Family Communication: patient Disposition Plan: ICU   Subjective: Pt says she feels a little better today, eating and drinking, no seizures  Objective: Vitals:   01/29/18 0515 01/29/18 0530 01/29/18 0545 01/29/18 0600  BP: 129/64 116/61 (!) 147/63 140/74  Pulse: 79 80 90 88  Resp: 14 14 15 15   Temp:      TempSrc:      SpO2: 96% 99% 98% 95%  Weight:      Height:        Intake/Output Summary (Last 24 hours) at 01/29/2018 0706 Last data filed at 01/29/2018 0600 Gross per 24 hour  Intake 4169.66 ml  Output 375 ml  Net 3794.66 ml   Filed Weights   01/28/18 0105 01/28/18 0316 01/29/18 0450  Weight: 86 kg (189 lb 9.5 oz) 86 kg (189 lb 9.5 oz) 90.8 kg (200 lb 2.8 oz)   REVIEW OF SYSTEMS  As per history otherwise all reviewed and reported negative  Exam:  General exam: awake, alert, no distress, cooperative. Edentulous.  Respiratory system: rare expiratory wheezes, no crackles heard.  No increased work of breathing. Cardiovascular system: S1 & S2 heard, RRR. No JVD, murmurs, gallops, clicks or pedal edema. Gastrointestinal system: Abdomen is nondistended, soft and nontender. Normal bowel sounds heard. Central nervous system: Alert and oriented. No focal neurological deficits.  Extremities: no cyanosis or pitting edema.  Data Reviewed: Basic Metabolic Panel: Recent Labs  Lab 01/23/18 0543 01/24/18 0350 01/26/18 0931 01/27/18 1945 01/28/18 0323 01/29/18 0426  NA 140 141 142 136 135 138  K 3.6 3.7 4.7 3.2* 3.2* 5.1  CL 96* 97* 92* 91* 98 108  CO2 33* 29 30* 26 27 25   GLUCOSE 111* 94 117* 128* 167* 99  BUN 8 14 21  34* 32* 22  CREATININE 0.64 0.69 1.16* 3.60* 2.41* 0.72  CALCIUM 9.3 9.2 10.3 8.6* 7.5* 8.4*  MG 1.1* 1.2* 1.3*  --  1.3* 1.8  PHOS 4.6  --   --    --   --   --    Liver Function Tests: Recent Labs  Lab 01/26/18 0931 01/27/18 1945 01/29/18 0426  AST 34 29 27  ALT 31 25 24   ALKPHOS 83 69 62  BILITOT <0.2 0.5 0.2*  PROT 7.2 7.8 5.8*  ALBUMIN 3.8 3.4* 2.3*   No results for input(s): LIPASE, AMYLASE in the last 168 hours. Recent Labs  Lab 01/27/18 2004  AMMONIA 21   CBC: Recent Labs  Lab 01/23/18 0543 01/26/18 0931 01/27/18 1945 01/28/18 0323 01/29/18 0426  WBC 9.6 13.1* 23.3* 23.6* 12.1*  NEUTROABS  --  9.7* 19.7*  --  9.8*  HGB 12.0 13.8 13.2 11.2* 9.5*  HCT 38.7 42.9 42.0 36.4 32.2*  MCV 91.9 93 94.8 95.3 97.3  PLT 232 394 418* 387 270   Cardiac Enzymes: Recent Labs  Lab 01/27/18 1945 01/27/18 2342  CKTOTAL 223  --   TROPONINI <0.03 <0.03   CBG (last 3)  Recent Labs    01/27/18 1947  GLUCAP 130*   Recent Results (from the past 240 hour(s))  Gastrointestinal Panel by PCR , Stool     Status: None   Collection Time: 01/19/18  3:00 PM  Result Value Ref Range Status   Campylobacter species NOT DETECTED NOT DETECTED Final   Plesimonas shigelloides NOT DETECTED NOT DETECTED Final   Salmonella species NOT DETECTED NOT DETECTED Final   Yersinia enterocolitica NOT DETECTED NOT DETECTED Final   Vibrio species NOT DETECTED NOT DETECTED Final   Vibrio cholerae NOT DETECTED NOT DETECTED Final   Enteroaggregative E coli (EAEC) NOT DETECTED NOT DETECTED Final   Enteropathogenic E coli (EPEC) NOT DETECTED NOT DETECTED Final   Enterotoxigenic E coli (ETEC) NOT DETECTED NOT DETECTED Final   Shiga like toxin producing E coli (STEC) NOT DETECTED NOT DETECTED Final   Shigella/Enteroinvasive E coli (EIEC) NOT DETECTED NOT DETECTED Final   Cryptosporidium NOT DETECTED NOT DETECTED Final   Cyclospora cayetanensis NOT DETECTED NOT DETECTED Final   Entamoeba histolytica NOT DETECTED NOT DETECTED Final   Giardia lamblia NOT DETECTED NOT DETECTED Final   Adenovirus F40/41 NOT DETECTED NOT DETECTED Final   Astrovirus NOT  DETECTED NOT DETECTED Final   Norovirus GI/GII NOT DETECTED NOT DETECTED Final   Rotavirus A NOT DETECTED NOT DETECTED Final   Sapovirus (I, II, IV, and V) NOT DETECTED NOT DETECTED Final    Comment: Performed at [facility], [address] [place], [place] [address]  Culture, blood (routine x 2)     Status: None   Collection Time: 01/21/18  5:40 PM  Result Value Ref Range Status   Specimen Description BLOOD RIGHT HAND  Final   Special Requests   Final    BOTTLES DRAWN AEROBIC AND ANAEROBIC Blood Culture adequate volume   Culture   Final  NO GROWTH 5 DAYS Performed at [facility], [place] [address], [place], [place] [address]    Report Status 01/26/2018 FINAL  Final  Culture, blood (routine x 2)     Status: None   Collection Time: 01/21/18  5:41 PM  Result Value Ref Range Status   Specimen Description BLOOD RIGHT HAND  Final   Special Requests   Final    BOTTLES DRAWN AEROBIC AND ANAEROBIC Blood Culture adequate volume   Culture   Final    NO GROWTH 5 DAYS Performed at [facility], [place] [address] [place], [place] [address]    Report Status 01/26/2018 FINAL  Final  Culture, respiratory (NON-Expectorated)     Status: None   Collection Time: 01/22/18 10:08 AM  Result Value Ref Range Status   Specimen Description TRACHEAL ASPIRATE  Final   Special Requests NONE  Final   Gram Stain   Final    ABUNDANT WBC PRESENT, PREDOMINANTLY PMN ABUNDANT GRAM POSITIVE COCCI Performed at [facility], [place] [address] [place], [place] [address]    Culture   Final    MODERATE METHICILLIN RESISTANT STAPHYLOCOCCUS AUREUS   Report Status 01/24/2018 FINAL  Final   Organism ID, Bacteria METHICILLIN RESISTANT STAPHYLOCOCCUS AUREUS  Final      Susceptibility   Methicillin resistant staphylococcus aureus - MIC*    CIPROFLOXACIN >=8 RESISTANT Resistant     ERYTHROMYCIN >=8 RESISTANT Resistant     GENTAMICIN <=0.5 SENSITIVE Sensitive     OXACILLIN >=4 RESISTANT Resistant      TETRACYCLINE 2 SENSITIVE Sensitive     VANCOMYCIN 1 SENSITIVE Sensitive     TRIMETH/SULFA <=10 SENSITIVE Sensitive     CLINDAMYCIN >=8 RESISTANT Resistant     RIFAMPIN <=0.5 SENSITIVE Sensitive     Inducible Clindamycin NEGATIVE Sensitive     * MODERATE METHICILLIN RESISTANT STAPHYLOCOCCUS AUREUS  Culture, blood (Routine X 2) w Reflex to ID Panel     Status: None (Preliminary result)   Collection Time: 01/27/18  7:45 PM  Result Value Ref Range Status   Specimen Description BLOOD  Final   Special Requests NONE  Final   Culture   Final    NO GROWTH 2 DAYS Performed at [facility], [address] [place], [place] [address]    Report Status PENDING  Incomplete  Culture, blood (Routine X 2) w Reflex to ID Panel     Status: None (Preliminary result)   Collection Time: 01/27/18  8:35 PM  Result Value Ref Range Status   Specimen Description BLOOD LEFT HAND  Final   Special Requests   Final    BOTTLES DRAWN AEROBIC AND ANAEROBIC Blood Culture adequate volume   Culture   Final    NO GROWTH 2 DAYS Performed at [facility], [address], [place], [place] [address]    Report Status PENDING  Incomplete     Studies: Ct Head Wo Contrast  Result Date: 01/27/2018 CLINICAL DATA:  Hypoxia, syncope. History of hypertension, hyperlipidemia, diabetes, seizure. EXAM: CT HEAD WITHOUT CONTRAST TECHNIQUE: Contiguous axial images were obtained from the base of the skull through the vertex without intravenous contrast. COMPARISON:  CT HEAD January 21, 2018 and CT HEAD January 18, 2018 FINDINGS: BRAIN: No intraparenchymal hemorrhage, mass effect nor midline shift. The ventricles and sulci are normal for age. No acute large vascular territory infarcts. No abnormal extra-axial fluid collections. Basal cisterns are patent. VASCULAR: Mild calcific atherosclerosis of the carotid siphons. SKULL: No skull fracture.  Osteopenia. No significant scalp soft tissue swelling. SINUSES/ORBITS: The mastoid air-cells and  included paranasal sinuses are well-aerated. RIGHT frontal osteoma. The included ocular globes and orbital contents are non-suspicious. OTHER: None. IMPRESSION: Stable negative non-contrast CT HEAD for age. Electronically Signed   By: Elon Alas M.D.   On: 01/27/2018 21:14   Dg Chest Port 1 View  Result Date: 01/28/2018 CLINICAL DATA:  67 year old female with central line placement EXAM: PORTABLE CHEST 1 VIEW COMPARISON:  Chest radiograph dated 01/27/2018 FINDINGS: There has been interval placement of a right IJ central line the tip over the right atrium close to the cavoatrial junction. There is cardiomegaly. No focal consolidative changes, pleural effusion, or pneumothorax. Lower cervical fixation hardware. No acute osseous pathology. IMPRESSION: Interval placement of a right IJ central venous line with tip over the right atrium close to the cavoatrial junction. No pneumothorax. No other interval change. Electronically Signed   By: Anner Crete M.D.   On: 01/28/2018 00:41   Dg Chest Port 1 View  Result Date: 01/27/2018 CLINICAL DATA:  Hypoxia EXAM: PORTABLE CHEST 1 VIEW COMPARISON:  01/22/2018 FINDINGS: Interval extubation. Lower cervical plate and screw fixator. Dorsal column stimulator noted. Atherosclerotic calcification of the aortic arch. Low lung volumes are present, causing crowding of the pulmonary vasculature. Mild enlargement of the cardiopericardial silhouette with indistinctness of the pulmonary vasculature suggesting pulmonary venous hypertension. Mild bilateral airway thickening. IMPRESSION: 1. Airway thickening is present, suggesting bronchitis or reactive airways disease. 2. Mild enlargement of the cardiopericardial silhouette with pulmonary venous hypertension. 3.  Atherosclerotic calcification of the aortic arch. 4. Interval extubation. Electronically Signed   By: Van Clines M.D.   On: 01/27/2018 20:28   Scheduled Meds: . aspirin EC  81 mg Oral Daily  . atorvastatin   40 mg Oral Daily  . DULoxetine  30 mg Oral Daily  . enoxaparin (LOVENOX) injection  30 mg Subcutaneous Q24H  . fluticasone furoate-vilanterol  1 puff Inhalation Daily  . gabapentin  600 mg Oral QID  . guaiFENesin  600 mg Oral BID  . levalbuterol  0.63 mg Nebulization Q6H  . levETIRAcetam  1,000 mg Oral QHS  . levothyroxine  50 mcg Oral QAC breakfast  . loratadine  10 mg Oral Daily  . mouth rinse  15 mL Mouth Rinse BID  . mometasone-formoterol  2 puff Inhalation BID  . potassium chloride  20 mEq Oral BID  . sodium chloride flush  3 mL Intravenous Q12H   Continuous Infusions: . sodium chloride    . aztreonam Stopped (01/29/18 0557)  . levofloxacin (LEVAQUIN) IV    . norepinephrine (LEVOPHED) Adult infusion 1 mcg/min (01/29/18 0600)  . vancomycin      Principal Problem:   Severe sepsis with septic shock (HCC) Active Problems:   COPD (chronic obstructive pulmonary disease) (HCC)   Seizure disorder (HCC)   Chronic pain syndrome   Hypothyroid   Hypertension   AKI (acute kidney injury) ([place])   Hypokalemia  Critical Care Time spent: 31 mins  Irwin Brakeman, MD, FAAFP [facility] Pager [phone]  If 7PM-7AM, please contact night-coverage [email or web address] Password [email or web address] 01/29/2018, 7:06 AM    LOS: 2 days

## 2018-01-30 ENCOUNTER — Encounter: Payer: Self-pay | Admitting: Internal Medicine

## 2018-01-30 ENCOUNTER — Inpatient Hospital Stay (HOSPITAL_COMMUNITY): Payer: Medicare PPO

## 2018-01-30 DIAGNOSIS — J15212 Pneumonia due to Methicillin resistant Staphylococcus aureus: Secondary | ICD-10-CM

## 2018-01-30 DIAGNOSIS — G894 Chronic pain syndrome: Secondary | ICD-10-CM

## 2018-01-30 DIAGNOSIS — G40909 Epilepsy, unspecified, not intractable, without status epilepticus: Secondary | ICD-10-CM

## 2018-01-30 LAB — COMPREHENSIVE METABOLIC PANEL
ALBUMIN: 2.4 g/dL — AB (ref 3.5–5.0)
ALK PHOS: 62 U/L (ref 38–126)
ALT: 25 U/L (ref 0–44)
ANION GAP: 6 (ref 5–15)
AST: 28 U/L (ref 15–41)
BILIRUBIN TOTAL: 0.5 mg/dL (ref 0.3–1.2)
BUN: 14 mg/dL (ref 8–23)
CALCIUM: 8.7 mg/dL — AB (ref 8.9–10.3)
CO2: 25 mmol/L (ref 22–32)
Chloride: 107 mmol/L (ref 98–111)
Creatinine, Ser: 0.59 mg/dL (ref 0.44–1.00)
GFR calc Af Amer: >60 mL/min (ref >60)
GFR calc non Af Amer: >60 mL/min (ref >60)
GLUCOSE: 83 mg/dL (ref 70–99)
POTASSIUM: 5.5 mmol/L — AB (ref 3.5–5.1)
SODIUM: 138 mmol/L (ref 135–145)
TOTAL PROTEIN: 5.7 g/dL — AB (ref 6.5–8.1)

## 2018-01-30 LAB — CBC WITH DIFFERENTIAL/PLATELET
Basophils Absolute: 0 10*3/uL (ref 0.0–0.1)
Basophils Relative: 0 %
Eosinophils Absolute: 0.3 10*3/uL (ref 0.0–0.7)
Eosinophils Relative: 3 %
HEMATOCRIT: 31.7 % — AB (ref 36.0–46.0)
HEMOGLOBIN: 9.4 g/dL — AB (ref 12.0–15.0)
Lymphocytes Relative: 11 %
Lymphs Abs: 0.9 10*3/uL (ref 0.7–4.0)
MCH: 28.7 pg (ref 26.0–34.0)
MCHC: 29.7 g/dL — AB (ref 30.0–36.0)
MCV: 96.9 fL (ref 78.0–100.0)
MONO ABS: 0.7 10*3/uL (ref 0.1–1.0)
MONOS PCT: 8 %
NEUTROS ABS: 6.9 10*3/uL (ref 1.7–7.7)
NEUTROS PCT: 78 %
Platelets: 283 10*3/uL (ref 150–400)
RBC: 3.27 MIL/uL — ABNORMAL LOW (ref 3.87–5.11)
RDW: 17.1 % — AB (ref 11.5–15.5)
WBC: 8.8 10*3/uL (ref 4.0–10.5)

## 2018-01-30 LAB — MAGNESIUM: Magnesium: 1.2 mg/dL — ABNORMAL LOW (ref 1.7–2.4)

## 2018-01-30 MED ORDER — METOPROLOL TARTRATE 25 MG PO TABS
25.0000 mg | ORAL_TABLET | Freq: Two times a day (BID) | ORAL | Status: DC
  Administered 2018-01-30 – 2018-01-31 (×3): 25 mg via ORAL
  Filled 2018-01-30 (×3): qty 1

## 2018-01-30 MED ORDER — DOXYCYCLINE HYCLATE 100 MG PO TABS
100.0000 mg | ORAL_TABLET | Freq: Two times a day (BID) | ORAL | Status: DC
  Administered 2018-01-30 – 2018-01-31 (×3): 100 mg via ORAL
  Filled 2018-01-30 (×3): qty 1

## 2018-01-30 MED ORDER — HYDRALAZINE HCL 20 MG/ML IJ SOLN
10.0000 mg | INTRAMUSCULAR | Status: DC | PRN
  Administered 2018-01-30 (×2): 10 mg via INTRAVENOUS
  Filled 2018-01-30 (×2): qty 1

## 2018-01-30 MED ORDER — MAGNESIUM SULFATE 4 GM/100ML IV SOLN
4.0000 g | Freq: Once | INTRAVENOUS | Status: AC
  Administered 2018-01-30: 4 g via INTRAVENOUS
  Filled 2018-01-30: qty 100

## 2018-01-30 MED ORDER — LEVALBUTEROL HCL 0.63 MG/3ML IN NEBU
0.6300 mg | INHALATION_SOLUTION | Freq: Three times a day (TID) | RESPIRATORY_TRACT | Status: DC
  Administered 2018-01-31: 0.63 mg via RESPIRATORY_TRACT

## 2018-01-30 MED ORDER — LEVALBUTEROL HCL 0.63 MG/3ML IN NEBU
0.6300 mg | INHALATION_SOLUTION | Freq: Four times a day (QID) | RESPIRATORY_TRACT | Status: DC | PRN
Start: 2018-01-30 — End: 2018-01-31

## 2018-01-30 NOTE — Progress Notes (Signed)
Patient watching TV about 3 asked to come off BiPAP -- placed back on 2lpm/[place]

## 2018-01-30 NOTE — Progress Notes (Signed)
PROGRESS NOTE                                                                                                                                                                                                             Patient Demographics:    Tasha Clarke, is a 67 y.o. female, DOB - 19-Dec-1950, SEG:[id number]  Admit date - 01/27/2018   Admitting Physician Truett Mainland, DO  Outpatient Primary MD for the patient is Dettinger, Fransisca Kaufmann, MD  LOS - 3  Outpatient Specialists: None  Chief Complaint  Patient presents with  . Loss of Consciousness    unsresponsive       Brief Narrative  67 year old female with seizure disorder, CKD stage III, COPD on home O2 (2 L), sleep apnea, diabetes mellitus type 2 and hypothyroidism who was recently hospitalist for gastroenteritis and also had status epilepticus requiring intubation.  She was  on Keppra and Vimpat and required transfer to [facility].  She was discharged from [facility]  3 days back and presented to any pain hospital with severe sepsis with septic shock.   Subjective:   No overnight events.  Patient has been off pressors and blood pressure now elevated.  Reports continuing to feel much better and wanting to go home.   Assessment  & Plan :    Principal Problem:   Severe sepsis with septic shock ([place]) Sepsis resolved.  Off pressors and blood pressure stable.  No clear source of infection but suspect given symptoms of cough with productive phlegm and MRSA on sputum culture she likely had aspiration pneumonia with recent intubation. Blood cultures negative.  Narrow antibiotics to oral doxycycline based on sputum culture and sensitivity. Transfer to medical floor.  Acute kidney injury ([place]) Prerenal azotemia secondary to septic shock.  Resolved with IV fluids.  Seizure disorder She is on Keppra and recently started on gabapentin by neurology and Vimpat weaned  off.  Continue home medication.  No issues this hospitalization.  COPD with chronic hypoxic respiratory failure Continue PRN nebs.  Stable on 2 L via nasal cannula.  Chronic pain on opiates Narcotic and sedatives held.  Will resume in a.m.  ?  Gastric mass seen on CT abdomen from 7/9. Gastric wall thickening along the posterior aspect of the fundus and cardia with potential polypoid extension into the gastric  lumen and cannot exclude gastric mass on CT.  Patient reports ongoing tobacco use but denies weight loss or family history of malignancy.  I have discussed with [facility] who will arrange outpatient follow-up.  Patient reports remote EGD.  Essential hypertension Blood pressure medications were held due to septic shock now improved and will resume her beta-blocker.  Hypothyroidism Continue home Synthroid.  Hypokalemia/hypomagnesemia Frequent PVCs on monitor.  Has elevated potassium today and supplement discontinued.  Low magnesium replenished.    Code Status : Full code  Family Communication  : None at bedside  Disposition Plan  : Transfer to medical floor.  Home possibly tomorrow  Barriers For Discharge : Active symptoms  Consults  : None  Procedures  : CT head  DVT Prophylaxis  :  Lovenox -   Lab Results  Component Value Date   PLT 283 01/30/2018    Antibiotics  :    Anti-infectives (From admission, onward)   Start     Dose/Rate Route Frequency Ordered Stop   01/30/18 1000  doxycycline (VIBRA-TABS) tablet 100 mg     100 mg Oral Every 12 hours 01/30/18 0735     01/29/18 1800  levofloxacin (LEVAQUIN) IVPB 500 mg  Status:  Discontinued     500 mg 100 mL/hr over 60 Minutes Intravenous Every 48 hours 01/27/18 2122 01/29/18 0812   01/29/18 1600  vancomycin (VANCOCIN) IVPB 1000 mg/200 mL premix  Status:  Discontinued     1,000 mg 200 mL/hr over 60 Minutes Intravenous Every 48 hours 01/27/18 2124 01/30/18 0735   01/29/18 0815  levofloxacin (LEVAQUIN)  IVPB 750 mg  Status:  Discontinued     750 mg 100 mL/hr over 90 Minutes Intravenous Every 24 hours 01/29/18 0812 01/30/18 0735   01/28/18 0300  aztreonam (AZACTAM) 1 g in sodium chloride 0.9 % 100 mL IVPB  Status:  Discontinued     1 g 200 mL/hr over 30 Minutes Intravenous Every 8 hours 01/27/18 2121 01/30/18 0735   01/27/18 2130  vancomycin (VANCOCIN) 500 mg in sodium chloride 0.9 % 100 mL IVPB     500 mg 100 mL/hr over 60 Minutes Intravenous  Once 01/27/18 2119 01/28/18 0118   01/27/18 2100  levofloxacin (LEVAQUIN) IVPB 750 mg     750 mg 100 mL/hr over 90 Minutes Intravenous  Once 01/27/18 2046 01/27/18 2256   01/27/18 2100  aztreonam (AZACTAM) 2 g in sodium chloride 0.9 % 100 mL IVPB     2 g 200 mL/hr over 30 Minutes Intravenous  Once 01/27/18 2046 01/27/18 2146   01/27/18 2100  vancomycin (VANCOCIN) IVPB 1000 mg/200 mL premix     1,000 mg 200 mL/hr over 60 Minutes Intravenous  Once 01/27/18 2046 01/28/18 0133        Objective:   Vitals:   01/30/18 0849 01/30/18 1000 01/30/18 1430 01/30/18 1500  BP:  (!) 194/88 (!) 162/104 (!) 176/88  Pulse:   73 76  Resp:  19 19 (!) 25  Temp:      TempSrc:      SpO2: 96%  100% 100%  Weight:      Height:        Wt Readings from Last 3 Encounters:  01/29/18 90.8 kg (200 lb 2.8 oz)  01/24/18 82.8 kg (182 lb 8.7 oz)  11/16/17 99.8 kg (220 lb)     Intake/Output Summary (Last 24 hours) at 01/30/2018 1700 Last data filed at 01/30/2018 1600 Gross per 24 hour  Intake 2935 ml  Output 2000 ml  Net 935 ml     Physical Exam  Gen: not in distress HEENT:  moist mucosa, supple neck Chest: clear b/l, no added sounds CVS: N S1&S2, no murmurs, rubs or gallop GI: soft, NT, ND, BS+ Musculoskeletal: warm, no edema     Data Review:    CBC Recent Labs  Lab 01/26/18 0931 01/27/18 1945 01/28/18 0323 01/29/18 0426 01/30/18 0352  WBC 13.1* 23.3* 23.6* 12.1* 8.8  HGB 13.8 13.2 11.2* 9.5* 9.4*  HCT 42.9 42.0 36.4 32.2* 31.7*  PLT 394  418* 387 270 283  MCV 93 94.8 95.3 97.3 96.9  MCH 29.9 29.8 29.3 28.7 28.7  MCHC 32.2 31.4 30.8 29.5* 29.7*  RDW 18.4* 17.7* 17.6* 16.9* 17.1*  LYMPHSABS 1.6 1.9  --  0.9 0.9  MONOABS  --  1.6*  --  1.0 0.7  EOSABS 0.2 0.1  --  0.4 0.3  BASOSABS 0.1 0.1  --  0.0 0.0    Chemistries  Recent Labs  Lab 01/24/18 0350 01/26/18 0931 01/27/18 1945 01/28/18 0323 01/29/18 0426 01/30/18 0352  NA 141 142 136 135 138 138  K 3.7 4.7 3.2* 3.2* 5.1 5.5*  CL 97* 92* 91* 98 108 107  CO2 29 30* 26 27 25 25   GLUCOSE 94 117* 128* 167* 99 83  BUN 14 21 34* 32* 22 14  CREATININE 0.69 1.16* 3.60* 2.41* 0.72 0.59  CALCIUM 9.2 10.3 8.6* 7.5* 8.4* 8.7*  MG 1.2* 1.3*  --  1.3* 1.8 1.2*  AST  --  34 29  --  27 28  ALT  --  31 25  --  24 25  ALKPHOS  --  83 69  --  62 62  BILITOT  --  <0.2 0.5  --  0.2* 0.5   ------------------------------------------------------------------------------------------------------------------ No results for input(s): CHOL, HDL, LDLCALC, TRIG, CHOLHDL, LDLDIRECT in the last 72 hours.  No results found for: HGBA1C ------------------------------------------------------------------------------------------------------------------ No results for input(s): TSH, T4TOTAL, T3FREE, THYROIDAB in the last 72 hours.  Invalid input(s): FREET3 ------------------------------------------------------------------------------------------------------------------ No results for input(s): VITAMINB12, FOLATE, FERRITIN, TIBC, IRON, RETICCTPCT in the last 72 hours.  Coagulation profile No results for input(s): INR, PROTIME in the last 168 hours.  No results for input(s): DDIMER in the last 72 hours.  Cardiac Enzymes Recent Labs  Lab 01/27/18 1945 01/27/18 2342  TROPONINI <0.03 <0.03   ------------------------------------------------------------------------------------------------------------------    Component Value Date/Time   BNP 53.0 10/28/2017 1657    Inpatient  Medications  Scheduled Meds: . aspirin EC  81 mg Oral Daily  . atorvastatin  40 mg Oral Daily  . doxycycline  100 mg Oral Q12H  . DULoxetine  30 mg Oral Daily  . enoxaparin (LOVENOX) injection  40 mg Subcutaneous Q24H  . fluticasone furoate-vilanterol  1 puff Inhalation Daily  . gabapentin  600 mg Oral QID  . guaiFENesin  600 mg Oral BID  . levalbuterol  0.63 mg Nebulization Q6H  . levETIRAcetam  1,000 mg Oral QHS  . levothyroxine  50 mcg Oral QAC breakfast  . loratadine  10 mg Oral Daily  . mouth rinse  15 mL Mouth Rinse BID  . metoprolol tartrate  25 mg Oral BID  . mometasone-formoterol  2 puff Inhalation BID  . sodium chloride flush  3 mL Intravenous Q12H   Continuous Infusions: . sodium chloride 100 mL/hr at 01/30/18 0535  . norepinephrine (LEVOPHED) Adult infusion Stopped (01/29/18 0850)   PRN Meds:.acetaminophen **OR** acetaminophen, cyclobenzaprine, hydrALAZINE, ondansetron **OR** ondansetron (  ZOFRAN) IV, senna-docusate  Micro Results Recent Results (from the past 240 hour(s))  Culture, blood (routine x 2)     Status: None   Collection Time: 01/21/18  5:40 PM  Result Value Ref Range Status   Specimen Description BLOOD RIGHT HAND  Final   Special Requests   Final    BOTTLES DRAWN AEROBIC AND ANAEROBIC Blood Culture adequate volume   Culture   Final    NO GROWTH 5 DAYS Performed at [facility], [address] [place], [place] [address]    Report Status 01/26/2018 FINAL  Final  Culture, blood (routine x 2)     Status: None   Collection Time: 01/21/18  5:41 PM  Result Value Ref Range Status   Specimen Description BLOOD RIGHT HAND  Final   Special Requests   Final    BOTTLES DRAWN AEROBIC AND ANAEROBIC Blood Culture adequate volume   Culture   Final    NO GROWTH 5 DAYS Performed at [facility], [place] [address], [place], [place] [address]    Report Status 01/26/2018 FINAL  Final  Culture, respiratory (NON-Expectorated)     Status: None   Collection  Time: 01/22/18 10:08 AM  Result Value Ref Range Status   Specimen Description TRACHEAL ASPIRATE  Final   Special Requests NONE  Final   Gram Stain   Final    ABUNDANT WBC PRESENT, PREDOMINANTLY PMN ABUNDANT GRAM POSITIVE COCCI Performed at [facility], [place] [address] [place], [place] [address]    Culture   Final    MODERATE METHICILLIN RESISTANT STAPHYLOCOCCUS AUREUS   Report Status 01/24/2018 FINAL  Final   Organism ID, Bacteria METHICILLIN RESISTANT STAPHYLOCOCCUS AUREUS  Final      Susceptibility   Methicillin resistant staphylococcus aureus - MIC*    CIPROFLOXACIN >=8 RESISTANT Resistant     ERYTHROMYCIN >=8 RESISTANT Resistant     GENTAMICIN <=0.5 SENSITIVE Sensitive     OXACILLIN >=4 RESISTANT Resistant     TETRACYCLINE 2 SENSITIVE Sensitive     VANCOMYCIN 1 SENSITIVE Sensitive     TRIMETH/SULFA <=10 SENSITIVE Sensitive     CLINDAMYCIN >=8 RESISTANT Resistant     RIFAMPIN <=0.5 SENSITIVE Sensitive     Inducible Clindamycin NEGATIVE Sensitive     * MODERATE METHICILLIN RESISTANT STAPHYLOCOCCUS AUREUS  Culture, blood (Routine X 2) w Reflex to ID Panel     Status: None (Preliminary result)   Collection Time: 01/27/18  7:45 PM  Result Value Ref Range Status   Specimen Description BLOOD RIGHT ARM DRAWN BY RN  Final   Special Requests   Final    BOTTLES DRAWN AEROBIC AND ANAEROBIC Blood Culture adequate volume   Culture   Final    NO GROWTH 3 DAYS Performed at [facility], [address], [place], [place] [address]    Report Status PENDING  Incomplete  Culture, blood (Routine X 2) w Reflex to ID Panel     Status: None (Preliminary result)   Collection Time: 01/27/18  8:35 PM  Result Value Ref Range Status   Specimen Description BLOOD LEFT HAND DRAWN BY RN  Final   Special Requests   Final    BOTTLES DRAWN AEROBIC AND ANAEROBIC Blood Culture adequate volume   Culture   Final    NO GROWTH 3 DAYS Performed at [facility], [address], [place], [place] [address]     Report Status PENDING  Incomplete    Radiology Reports Ct Abdomen Pelvis  Wo Contrast  Result Date: 01/17/2018 CLINICAL DATA:  Vomiting, diarrhea and body aches. EXAM: CT ABDOMEN AND PELVIS WITHOUT CONTRAST TECHNIQUE: Multidetector CT imaging of the abdomen and pelvis was performed following the standard protocol without IV contrast. COMPARISON:  None. FINDINGS: Lower chest: No acute abnormality. Hepatobiliary: No focal liver abnormality is seen. Status post cholecystectomy. No biliary dilatation. Pancreas: The pancreas appears atrophic. No obvious mass or inflammation by unenhanced CT. Spleen: Normal in size without focal abnormality. Adrenals/Urinary Tract: Nodularity along the superior aspect of the right adrenal gland measures approximately 13 mm and demonstrates low internal density of 4 Hounsfield units. This most likely corresponds to a benign adenoma. The left adrenal gland appears unremarkable. The kidneys show no evidence of hydronephrosis. There is a tiny nonobstructing calculus within the medial aspect of the mid right kidney measuring approximately 2 mm. The bladder is unremarkable. Stomach/Bowel: The stomach is partially distended with fluid. Along the posterior wall of the fundus, there is some potential nodular thickening with polypoid extension into the lumen. A mass cannot be excluded. This region of thickening measures roughly 3 cm in estimated diameter. Posterior aspect of the gastric fundus and cardia also demonstrates an area of focal outpouching containing a gas bubble which could represent a small diverticulum versus area of ulceration. No evidence to suggest gastric perforation or obstruction. There is some fluid scattered throughout the small bowel which is nonspecific but may be consistent with enteritis. No evidence of small bowel obstruction. The colon is decompressed. No free air or focal abscess identified. Vascular/Lymphatic: No significant vascular findings are present. No  enlarged abdominal or pelvic lymph nodes. Reproductive: Status post hysterectomy. No adnexal masses. Other: Spinal stimulator generator is positioned within the subcutaneous tissues of the left trans lumbar region with electrodes extending into the lower thoracic canal. Musculoskeletal: No acute or significant osseous findings. IMPRESSION: 1. Gastric wall thickening along the posterior aspect of the fundus and cardia with potential polypoid extension into the gastric lumen. Underlying gastric mass cannot be excluded. There also may be component of a small diverticulum versus area of ulceration along the posterior wall of the proximal stomach. Correlation with endoscopy may be helpful if there are any symptoms suggestive of gastric inflammation or pathology. 2. Possible enteritis involving the small bowel with scattered fluid throughout the small bowel. 3. 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 Electronically Signed   By: Aletta Edouard M.D.   On: 01/17/2018 10:31   Dg Wrist Complete Left  Result Date: 01/26/2018 CLINICAL DATA:  Status post fall. EXAM: LEFT WRIST - COMPLETE 3+ VIEW COMPARISON:  None. FINDINGS: Generalized osteopenia. No acute fracture or subluxation. Severe osteoarthritis of the first [facility] joint. Mild osteoarthritis of the scaphotrapeziotrapezoid joint. Soft tissues are normal. IMPRESSION: No acute osseous injury of the left wrist. Electronically Signed   By: Kathreen Devoid   On: 01/26/2018 15:13   Ct Head Wo Contrast  Result Date: 01/27/2018 CLINICAL DATA:  Hypoxia, syncope. History of hypertension, hyperlipidemia, diabetes, seizure. EXAM: CT HEAD WITHOUT CONTRAST TECHNIQUE: Contiguous axial images were obtained from the base of the skull through the vertex without intravenous contrast. COMPARISON:  CT HEAD January 21, 2018 and CT HEAD January 18, 2018 FINDINGS: BRAIN: No intraparenchymal hemorrhage, mass effect nor midline shift. The ventricles and sulci are  normal for age. No acute large vascular territory infarcts. No abnormal extra-axial fluid collections. Basal cisterns are patent. VASCULAR: Mild calcific atherosclerosis of the carotid siphons. SKULL: No skull fracture.  Osteopenia. No significant scalp soft tissue swelling. SINUSES/ORBITS: The mastoid air-cells and included paranasal sinuses are well-aerated. RIGHT frontal osteoma. The included ocular globes and orbital contents are non-suspicious. OTHER: None. IMPRESSION: Stable negative non-contrast CT HEAD for age. Electronically Signed   By: Elon Alas M.D.   On: 01/27/2018 21:14   Ct Head Wo Contrast  Result Date: 01/21/2018 CLINICAL DATA:  Seizures and altered mental status. EXAM: CT HEAD WITHOUT CONTRAST TECHNIQUE: Contiguous axial images were obtained from the base of the skull through the vertex without intravenous contrast. COMPARISON:  01/18/2018 FINDINGS: Brain: Mild generalized atrophy. No evidence of old or acute focal infarction, mass lesion, hemorrhage, hydrocephalus or extra-axial collection. Vascular: There is atherosclerotic calcification of the major vessels at the base of the brain. Skull: Negative Sinuses/Orbits: Small amount of fluid in the left maxillary sinus. Orbits negative. Other: None IMPRESSION: No acute or significant finding.  No change since 3 days ago. Electronically Signed   By: Nelson Chimes M.D.   On: 01/21/2018 18:41   Ct Head Wo Contrast  Result Date: 01/18/2018 CLINICAL DATA:  67 year old female with vomiting, diarrhea. Recent seizure activity, history of seizures. EXAM: CT HEAD WITHOUT CONTRAST TECHNIQUE: Contiguous axial images were obtained from the base of the skull through the vertex without intravenous contrast. COMPARISON:  None. FINDINGS: Brain: Mild motion artifact. Cerebral volume is within normal limits for age. No midline shift, ventriculomegaly, mass effect, evidence of mass lesion, intracranial hemorrhage or evidence of cortically based acute  infarction. Gray-white matter differentiation is within normal limits throughout the brain. Vascular: Calcified atherosclerosis at the skull base. No suspicious intracranial vascular hyperdensity. Skull: Osteopenia.  No acute osseous abnormality identified. Sinuses/Orbits: Low-density bubbly opacity in the visible left maxillary sinus. Other paranasal sinuses are clear (incidental osteomas in the right frontal and ethmoid sinuses). Tympanic cavities and mastoids are clear. Other: Visualized scalp soft tissues are within normal limits. Negative visible orbits soft tissues. IMPRESSION: 1.  Normal for age non contrast CT appearance of the brain. 2. Partially visible bubbly opacity in the left maxillary sinus, consider sinusitis. Electronically Signed   By: Genevie Ann M.D.   On: 01/18/2018 12:25   Dg Chest Port 1 View  Result Date: 01/30/2018 CLINICAL DATA:  Sepsis. CHF, asthma-COPD, smoking history, coronary artery disease with stent placement. EXAM: PORTABLE CHEST 1 VIEW COMPARISON:  Portable chest x-ray of January 28, 2018 FINDINGS: The lungs are adequately inflated. The interstitial markings are mildly increased. The pulmonary vascularity is engorged. The cardiac silhouette remains mildly enlarged. There is calcification in the wall of the aortic arch. The right internal jugular venous catheter tip projects at the junction of the middle and distal thirds of the SVC. Nerve stimulator electrodes project over the midthoracic spine. IMPRESSION: Mild CHF with interstitial edema.  No pneumonia. Thoracic aortic atherosclerosis. Electronically Signed   By: David  Martinique M.D.   On: 01/30/2018 07:32   Dg Chest Port 1 View  Result Date: 01/28/2018 CLINICAL DATA:  67 year old female with central line placement EXAM: PORTABLE CHEST 1 VIEW COMPARISON:  Chest radiograph dated 01/27/2018 FINDINGS: There has been interval placement of a right IJ central line the tip over the right atrium close to the cavoatrial junction. There is  cardiomegaly. No focal consolidative changes, pleural effusion, or pneumothorax. Lower cervical fixation hardware. No acute osseous pathology. IMPRESSION: Interval placement of a right IJ central venous line with tip over the right atrium close to the cavoatrial junction. No pneumothorax. No other interval change. Electronically Signed  By: Anner Crete M.D.   On: 01/28/2018 00:41   Dg Chest Port 1 View  Result Date: 01/27/2018 CLINICAL DATA:  Hypoxia EXAM: PORTABLE CHEST 1 VIEW COMPARISON:  01/22/2018 FINDINGS: Interval extubation. Lower cervical plate and screw fixator. Dorsal column stimulator noted. Atherosclerotic calcification of the aortic arch. Low lung volumes are present, causing crowding of the pulmonary vasculature. Mild enlargement of the cardiopericardial silhouette with indistinctness of the pulmonary vasculature suggesting pulmonary venous hypertension. Mild bilateral airway thickening. IMPRESSION: 1. Airway thickening is present, suggesting bronchitis or reactive airways disease. 2. Mild enlargement of the cardiopericardial silhouette with pulmonary venous hypertension. 3.  Atherosclerotic calcification of the aortic arch. 4. Interval extubation. Electronically Signed   By: Van Clines M.D.   On: 01/27/2018 20:28   Dg Chest Port 1 View  Result Date: 01/22/2018 CLINICAL DATA:  Endotracheal tube position EXAM: PORTABLE CHEST 1 VIEW COMPARISON:  01/21/2018 FINDINGS: Endotracheal tube 15 mm above the carina unchanged. Right arm PICC tip in the mid SVC unchanged. NG tube in the stomach. Slightly improved lung volume with mild improvement in bibasilar atelectasis. Negative for edema or effusion IMPRESSION: Improved lung volume with improvement in mild bibasilar atelectasis. Endotracheal tube 15 mm above the carina, unchanged. Electronically Signed   By: Franchot Gallo M.D.   On: 01/22/2018 08:52   Dg Chest Port 1 View  Result Date: 01/21/2018 CLINICAL DATA:  Acute respiratory  failure EXAM: PORTABLE CHEST 1 VIEW COMPARISON:  01/20/2018 FINDINGS: Endotracheal tube remains in good position. NG tube in the stomach. Right arm PICC tip in the mid SVC. Cardiac enlargement without heart failure or pneumonia. Mild bibasilar atelectasis. No effusion. Spinal cord stimulator noted. IMPRESSION: Support lines remain in good position. Mild bibasilar atelectasis unchanged from the prior study. Electronically Signed   By: Franchot Gallo M.D.   On: 01/21/2018 08:49   Dg Chest Port 1 View  Result Date: 01/20/2018 CLINICAL DATA:  ETT/VENT, HX N/V/D FOR SEVERAL DAYS.,CHF,COPD,HTN.ASTHMA,CKD EXAM: PORTABLE CHEST 1 VIEW COMPARISON:  Chest x-rays dated 01/19/2018 and 10/28/2017. FINDINGS: Stable cardiomegaly. Overall cardiomediastinal silhouette is stable. Endotracheal tube appears adequately positioned with tip approximately 4 cm above the carina. Enteric tube passes below the diaphragm. RIGHT-sided PICC line appears adequately positioned with tip at the level of the mid SVC. Stimulator hardware within the midline of the thoracic spine. Lungs are clear. No pneumothorax or significant pleural effusion is seen. No acute or suspicious osseous finding. Atherosclerotic changes again noted at the aortic arch. IMPRESSION: 1. No active disease. No evidence of pneumonia or pulmonary edema on today's exam. 2. Support apparatus appears adequately positioned. 3. Stable cardiomegaly. 4. Aortic atherosclerosis. Electronically Signed   By: Franki Cabot M.D.   On: 01/20/2018 08:44   Dg Chest Port 1 View  Result Date: 01/19/2018 CLINICAL DATA:  67 y/o  F; ET tube placement. EXAM: PORTABLE CHEST 1 VIEW COMPARISON:  01/19/2018 chest radiograph. FINDINGS: Endotracheal tube tip projects 6.5 cm above carina. Enteric tube tip is below the field of view in the abdomen. Right PICC line tip projects over mid SVC. Stable cardiomegaly and aortic atherosclerosis. Mild interval decrease in pulmonary vascular congestion. No new  consolidation, effusion, or pneumothorax. Bones are unremarkable. Partially visualized anterior cervical fusion hardware noted. IMPRESSION: 1. Endotracheal tube tip projects 6.5 cm above carina. 2. Enteric tube tip extends below field of view into abdomen. 3. Decreased pulmonary vascular congestion.  Stable cardiomegaly. Electronically Signed   By: Kristine Garbe M.D.   On: 01/19/2018 17:20  Portable Chest X-ray  Result Date: 01/19/2018 CLINICAL DATA:  Status post endotracheal tube placement EXAM: PORTABLE CHEST 1 VIEW COMPARISON:  CT chest 10/28/2017 FINDINGS: Right-sided PICC line with the tip projecting over the cavoatrial junction. Endotracheal tube with the tip 6 cm above the carina. Nasogastric tube coursing below the diaphragm. Diffuse bilateral interstitial thickening. No pleural effusion or pneumothorax. Stable cardiomegaly. No aggressive osseous lesion. IMPRESSION: 1. Right-sided PICC line with the tip projecting over the cavoatrial junction. 2. Endotracheal tube with the tip 6 cm above the carina. 3. Nasogastric tube coursing below the diaphragm. 4. Findings concerning for mild CHF. Electronically Signed   By: Kathreen Devoid   On: 01/19/2018 11:48    Time Spent in minutes  25   Asa Fath M.D on 01/30/2018 at 5:00 PM  Between 7am to 7pm - Pager - [phone]  After 7pm go to [email or web address] - password [facility]  [facility] -  Office  [phone]

## 2018-01-30 NOTE — Care Management Important Message (Signed)
Important Message  Patient Details  Name: Tasha Clarke MRN: [id number] Date of Birth: 06/02/1951   Medicare Important Message Given:  Yes    Shelda Altes 01/30/2018, 12:47 PM

## 2018-01-30 NOTE — Plan of Care (Signed)
Discussed with Dr. Clementeen Graham who states patient being discharged today vs tomorrow, requesting outpatient evaluation of ? Gastric mass on CT. Will send note to office staff.

## 2018-01-30 NOTE — Progress Notes (Signed)
Added hydralazine IV as needed for blood pressure elevations due to page regarding elevated blood pressure with systolic in the 741 range.  May consider restarting home medications as appropriate during daytime rounds.

## 2018-01-30 NOTE — Care Management Note (Addendum)
Case Management Note  Patient Details  Name: Tasha Clarke MRN: [id number] Date of Birth: 1951/06/08  Subjective/Objective:   Severe sepsis. From home with husband, recent hospitalization for gastroenteritis and status epilepticus. Recommended for SNF at that time, patient declines, went home with home health referral with [place].                   Action/Plan: Return home with resumption of home health.     Expected Discharge Date:    01/31/2018              Expected Discharge Plan:  [place]  In-House Referral:     Discharge planning Services  CM Consult  Post Acute Care Choice:  Home Health, Resumption of Svcs/PTA Provider Choice offered to:     DME Arranged:    DME Agency:     HH Arranged:    [place] Agency:  [place]  Status of Service:  Completed, signed off  If discussed at [place] of Stay Meetings, dates discussed:    Additional Comments:  Tasha Clarke, Tasha Reading, RN 01/30/2018, 2:54 PM

## 2018-01-31 DIAGNOSIS — A419 Sepsis, unspecified organism: Secondary | ICD-10-CM

## 2018-01-31 DIAGNOSIS — Z720 Tobacco use: Secondary | ICD-10-CM | POA: Diagnosis present

## 2018-01-31 DIAGNOSIS — I10 Essential (primary) hypertension: Secondary | ICD-10-CM | POA: Diagnosis not present

## 2018-01-31 DIAGNOSIS — E876 Hypokalemia: Secondary | ICD-10-CM

## 2018-01-31 DIAGNOSIS — R6521 Severe sepsis with septic shock: Secondary | ICD-10-CM

## 2018-01-31 DIAGNOSIS — J15212 Pneumonia due to Methicillin resistant Staphylococcus aureus: Secondary | ICD-10-CM | POA: Diagnosis present

## 2018-01-31 DIAGNOSIS — N179 Acute kidney failure, unspecified: Secondary | ICD-10-CM

## 2018-01-31 LAB — CBC WITH DIFFERENTIAL/PLATELET
Basophils Absolute: 0 10*3/uL (ref 0.0–0.1)
Basophils Relative: 0 %
Eosinophils Absolute: 0.3 10*3/uL (ref 0.0–0.7)
Eosinophils Relative: 4 %
HEMATOCRIT: 33.1 % — AB (ref 36.0–46.0)
Hemoglobin: 10.3 g/dL — ABNORMAL LOW (ref 12.0–15.0)
LYMPHS ABS: 0.8 10*3/uL (ref 0.7–4.0)
Lymphocytes Relative: 11 %
MCH: 29.2 pg (ref 26.0–34.0)
MCHC: 31.1 g/dL (ref 30.0–36.0)
MCV: 93.8 fL (ref 78.0–100.0)
MONOS PCT: 11 %
Monocytes Absolute: 0.8 10*3/uL (ref 0.1–1.0)
NEUTROS ABS: 5.4 10*3/uL (ref 1.7–7.7)
Neutrophils Relative %: 74 %
Platelets: 290 10*3/uL (ref 150–400)
RBC: 3.53 MIL/uL — ABNORMAL LOW (ref 3.87–5.11)
RDW: 16.7 % — ABNORMAL HIGH (ref 11.5–15.5)
WBC: 7.4 10*3/uL (ref 4.0–10.5)

## 2018-01-31 LAB — MAGNESIUM: Magnesium: 1.3 mg/dL — ABNORMAL LOW (ref 1.7–2.4)

## 2018-01-31 LAB — POTASSIUM: Potassium: 3.8 mmol/L (ref 3.5–5.1)

## 2018-01-31 MED ORDER — NICOTINE 21 MG/24HR TD PT24: 21.0000 mg | MEDICATED_PATCH | Freq: Every day | TRANSDERMAL | 0 refills | Status: DC

## 2018-01-31 MED ORDER — MAGNESIUM OXIDE 400 (241.3 MG) MG PO TABS
400.0000 mg | ORAL_TABLET | Freq: Every day | ORAL | Status: DC
  Administered 2018-01-31: 400 mg via ORAL
  Filled 2018-01-31: qty 1
  Filled 2018-01-31 (×2): qty 2

## 2018-01-31 MED ORDER — DOXYCYCLINE HYCLATE 100 MG PO TABS: 100.0000 mg | ORAL_TABLET | Freq: Two times a day (BID) | ORAL | 0 refills | Status: AC

## 2018-01-31 MED ORDER — MAGNESIUM SULFATE 4 GM/100ML IV SOLN
4.0000 g | Freq: Once | INTRAVENOUS | Status: AC
  Administered 2018-01-31: 4 g via INTRAVENOUS
  Filled 2018-01-31: qty 100

## 2018-01-31 NOTE — Progress Notes (Signed)
Patient left floor in stable condition via w/c accompanied by nurse tech. Discharged home. Elira Colasanti, RN 

## 2018-01-31 NOTE — Progress Notes (Signed)
Pt transferred to 300 

## 2018-01-31 NOTE — Progress Notes (Signed)
Pt not able to tolerate bipap.

## 2018-01-31 NOTE — Discharge Summary (Signed)
Physician Discharge Summary  Tasha Clarke DHR:[id number] DOB: 10-07-1950 DOA: 01/27/2018  PCP: Dettinger, Fransisca Kaufmann, MD  Admit date: 01/27/2018 Discharge date: 01/31/2018  Admitted From: Home Disposition: Home  Recommendations for Outpatient Follow-up:  1. Follow up with PCP in 1-2 weeks.  Patient will complete 7-day course of antibiotic on 7/26. 2. Outpatient follow-up with GI regarding suspicious masslike lesion on CT abdomen.  Home Health: PT and RN Equipment/Devices: 2 L O2 via nasal cannula  Discharge Condition: Fair CODE STATUS: Full code Diet recommendation: Regular    Discharge Diagnoses:  Principal Problem:   Severe sepsis with septic shock (HCC)   Active Problems:   MRSA (methicillin resistant staphylococcus aureus) pneumonia (HCC)   COPD (chronic obstructive pulmonary disease) (HCC)   Seizure disorder (HCC)   Chronic pain syndrome   Hypothyroid   AKI (acute kidney injury) ([place])   Hypokalemia   Hypomagnesemia   Tobacco abuse disorder   Essential hypertension Abnormal CT abdomen   Brief narrative/HPI Please refer to admission H&P for details, in brief,67 year old female with seizure disorder, CKD stage III, COPD on home O2 (2 L), sleep apnea, diabetes mellitus type 2 and hypothyroidism who was recently hospitalist for gastroenteritis and also had status epilepticus requiring intubation.  She was  on Keppra and Vimpat and required transfer to [facility].  She was discharged from [facility]  3 days back and presented to any pain hospital with severe sepsis with septic shock.  Hospital course  Principal Problem:   Severe sepsis with septic shock ([place]) Sepsis resolved.  Off pressors and blood pressure stable.  No clear source of infection but suspect given symptoms of cough with productive phlegm and MRSA on sputum culture she likely had aspiration pneumonia with recent intubation. Blood cultures negative.    Was on empiric antibiotics, narrowed to oral  doxycycline based on sputum culture and sensitivity to complete 7-day course.   Acute kidney injury ([place]) Prerenal azotemia secondary to septic shock.  Resolved with IV fluids.  Seizure disorder She is on Keppra and recently started on gabapentin by neurology and Vimpat weaned off.  Continue home medication.  No issues this hospitalization.  Patient has been clearly instructed that she cannot drive or operate any heavy machinery.  COPD with chronic hypoxic respiratory failure Continue PRN nebs.  Stable on 2 L via nasal cannula.  Chronic pain on opiates Narcotic and sedatives held.    Resumed upon discharge.  ?  Gastric mass seen on CT abdomen from 7/9. Gastric wall thickening along the posterior aspect of the fundus and cardia with potential polypoid extension into the gastric lumen and cannot exclude gastric mass on CT.  Patient reports ongoing tobacco use but denies weight loss or family history of malignancy.    Discussed with GI and have arranged outpatient appointment in 1 month..  Patient reports remote EGD.  Essential hypertension Blood pressure medications were held due to septic shock now improved and will resume her beta-blocker.  Hypothyroidism Continue home Synthroid.  Hypokalemia/hypomagnesemia Frequent PVCs on monitor. Potassium and magnesium replenished.  She is on chronic magnesium supplement at home which is resumed.  Tobacco abuse Counseled strongly on smoking cessation.  Prescribe nicotine patch.    Family Communication  :  Husband at bedside  Disposition Plan  :  Home  Consults  : None  Procedures  : CT head    Discharge Instructions   Allergies as of 01/31/2018      Reactions   Cephalexin Hives  Ketorolac Tromethamine Hives   Lac Bovis Nausea And Vomiting   Imdur  [isosorbide Dinitrate]    Iodinated Diagnostic Agents    Other Other (See Comments)   Alka seltzer causes blurred vision, fainting   Sodium Bicarbonate-citric Acid     Milk-related Compounds Nausea And Vomiting      Medication List    TAKE these medications   alendronate 70 MG tablet Commonly known as:  FOSAMAX Take 1 tablet (70 mg total) by mouth every Monday. Take with a full glass of water on an empty stomach.   aspirin EC 81 MG tablet Take 81 mg by mouth daily.   atorvastatin 40 MG tablet Commonly known as:  LIPITOR Take 40 mg by mouth daily.   cetirizine 10 MG tablet Commonly known as:  ZYRTEC Take 1 tablet (10 mg total) by mouth daily.   cyclobenzaprine 10 MG tablet Commonly known as:  FLEXERIL Take 1 tablet (10 mg total) by mouth 2 (two) times daily as needed for muscle spasms.   doxycycline 100 MG tablet Commonly known as:  VIBRA-TABS Take 1 tablet (100 mg total) by mouth every 12 (twelve) hours for 3 days.   DULoxetine 30 MG capsule Commonly known as:  CYMBALTA Take 30 mg by mouth daily.   EMBEDA 30-1.2 MG Cpcr Generic drug:  Morphine-Naltrexone Take 1 capsule by mouth 2 (two) times daily.   EPINEPHrine 0.3 mg/0.3 mL Soaj injection Commonly known as:  EPI-PEN Inject 0.3 mg into the muscle as needed.   Fish Oil 1000 MG Caps Take 1,000 mg by mouth daily.   fluticasone 50 MCG/ACT nasal spray Commonly known as:  FLONASE Place 2 sprays into both nostrils daily.   fluticasone furoate-vilanterol 200-25 MCG/INH Aepb Commonly known as:  BREO ELLIPTA Inhale 1 puff into the lungs daily.   Fluticasone-Salmeterol 250-50 MCG/DOSE Aepb Commonly known as:  ADVAIR Inhale 1 puff into the lungs 2 (two) times daily.   gabapentin 600 MG tablet Commonly known as:  NEURONTIN Take 1 tablet (600 mg total) by mouth 4 (four) times daily.   ipratropium-albuterol 0.5-2.5 (3) MG/3ML Soln Commonly known as:  DUONEB Take 3 mLs by nebulization every 6 (six) hours as needed (shortness of breathe).   Iron 325 (65 Fe) MG Tabs Take 1 tablet (325 mg total) by mouth daily.   lacosamide 50 MG Tabs tablet Commonly known as:  VIMPAT Take 2  tablets (100 mg total) by mouth 2 (two) times daily for 1 day, THEN 1 tablet (50 mg total) 2 (two) times daily for 1 day. Start taking on:  01/25/2018   levETIRAcetam 1000 MG tablet Commonly known as:  KEPPRA TAKE 1 TABLET BY MOUTH AT BEDTIME   levothyroxine 50 MCG tablet Commonly known as:  SYNTHROID, LEVOTHROID Take 50 mcg by mouth daily before breakfast.   losartan 100 MG tablet Commonly known as:  COZAAR TAKE 1 2 (ONE HALF) TABLET BY MOUTH ONCE DAILY   Magnesium 400 MG Caps Take 400 mg by mouth daily.   Magnesium Gluconate 550 MG Tabs Take 1 tablet (550 mg total) by mouth daily.   metoprolol tartrate 25 MG tablet Commonly known as:  LOPRESSOR Take 25 mg by mouth 2 (two) times daily.   multivitamin tablet Take 1 tablet by mouth daily.   NARCAN 4 MG/0.1ML Liqd nasal spray kit Generic drug:  naloxone Place into the nose.   nicotine 21 mg/24hr patch Commonly known as:  NICODERM CQ Place 1 patch (21 mg total) onto the skin daily.  PROVENTIL HFA 108 (90 Base) MCG/ACT inhaler Generic drug:  albuterol Inhale into the lungs.   TYLENOL PM EXTRA STRENGTH 25-500 MG Tabs tablet Generic drug:  diphenhydramine-acetaminophen Take 1 tablet by mouth at bedtime as needed (pain).   Vitamin D-3 5000 units Tabs Take 5,000 Units by mouth daily.      Follow-up Information    Dettinger, Fransisca Kaufmann, MD. Schedule an appointment as soon as possible for a visit in 1 week(s).   Specialties:  Family Medicine, Cardiology Contact information: [address] [place] [address] [phone]          Allergies  Allergen Reactions  . Cephalexin Hives  . Ketorolac Tromethamine Hives  . Lac Bovis Nausea And Vomiting  . Imdur  [Isosorbide Dinitrate]   . Iodinated Diagnostic Agents   . Other Other (See Comments)    Alka seltzer causes blurred vision, fainting  . Sodium Bicarbonate-Citric Acid   . Milk-Related Compounds Nausea And Vomiting      Procedures/Studies: Ct Abdomen  Pelvis Wo Contrast  Result Date: 01/17/2018 CLINICAL DATA:  Vomiting, diarrhea and body aches. EXAM: CT ABDOMEN AND PELVIS WITHOUT CONTRAST TECHNIQUE: Multidetector CT imaging of the abdomen and pelvis was performed following the standard protocol without IV contrast. COMPARISON:  None. FINDINGS: Lower chest: No acute abnormality. Hepatobiliary: No focal liver abnormality is seen. Status post cholecystectomy. No biliary dilatation. Pancreas: The pancreas appears atrophic. No obvious mass or inflammation by unenhanced CT. Spleen: Normal in size without focal abnormality. Adrenals/Urinary Tract: Nodularity along the superior aspect of the right adrenal gland measures approximately 13 mm and demonstrates low internal density of 4 Hounsfield units. This most likely corresponds to a benign adenoma. The left adrenal gland appears unremarkable. The kidneys show no evidence of hydronephrosis. There is a tiny nonobstructing calculus within the medial aspect of the mid right kidney measuring approximately 2 mm. The bladder is unremarkable. Stomach/Bowel: The stomach is partially distended with fluid. Along the posterior wall of the fundus, there is some potential nodular thickening with polypoid extension into the lumen. A mass cannot be excluded. This region of thickening measures roughly 3 cm in estimated diameter. Posterior aspect of the gastric fundus and cardia also demonstrates an area of focal outpouching containing a gas bubble which could represent a small diverticulum versus area of ulceration. No evidence to suggest gastric perforation or obstruction. There is some fluid scattered throughout the small bowel which is nonspecific but may be consistent with enteritis. No evidence of small bowel obstruction. The colon is decompressed. No free air or focal abscess identified. Vascular/Lymphatic: No significant vascular findings are present. No enlarged abdominal or pelvic lymph nodes. Reproductive: Status post  hysterectomy. No adnexal masses. Other: Spinal stimulator generator is positioned within the subcutaneous tissues of the left trans lumbar region with electrodes extending into the lower thoracic canal. Musculoskeletal: No acute or significant osseous findings. IMPRESSION: 1. Gastric wall thickening along the posterior aspect of the fundus and cardia with potential polypoid extension into the gastric lumen. Underlying gastric mass cannot be excluded. There also may be component of a small diverticulum versus area of ulceration along the posterior wall of the proximal stomach. Correlation with endoscopy may be helpful if there are any symptoms suggestive of gastric inflammation or pathology. 2. Possible enteritis involving the small bowel with scattered fluid throughout the small bowel. 3. 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 Electronically Signed   By: Aletta Edouard M.D.   On: 01/17/2018 10:31  Dg Wrist Complete Left  Result Date: 01/26/2018 CLINICAL DATA:  Status post fall. EXAM: LEFT WRIST - COMPLETE 3+ VIEW COMPARISON:  None. FINDINGS: Generalized osteopenia. No acute fracture or subluxation. Severe osteoarthritis of the first [facility] joint. Mild osteoarthritis of the scaphotrapeziotrapezoid joint. Soft tissues are normal. IMPRESSION: No acute osseous injury of the left wrist. Electronically Signed   By: Kathreen Devoid   On: 01/26/2018 15:13   Ct Head Wo Contrast  Result Date: 01/27/2018 CLINICAL DATA:  Hypoxia, syncope. History of hypertension, hyperlipidemia, diabetes, seizure. EXAM: CT HEAD WITHOUT CONTRAST TECHNIQUE: Contiguous axial images were obtained from the base of the skull through the vertex without intravenous contrast. COMPARISON:  CT HEAD January 21, 2018 and CT HEAD January 18, 2018 FINDINGS: BRAIN: No intraparenchymal hemorrhage, mass effect nor midline shift. The ventricles and sulci are normal for age. No acute large vascular territory infarcts. No abnormal  extra-axial fluid collections. Basal cisterns are patent. VASCULAR: Mild calcific atherosclerosis of the carotid siphons. SKULL: No skull fracture. Osteopenia. No significant scalp soft tissue swelling. SINUSES/ORBITS: The mastoid air-cells and included paranasal sinuses are well-aerated. RIGHT frontal osteoma. The included ocular globes and orbital contents are non-suspicious. OTHER: None. IMPRESSION: Stable negative non-contrast CT HEAD for age. Electronically Signed   By: Elon Alas M.D.   On: 01/27/2018 21:14   Ct Head Wo Contrast  Result Date: 01/21/2018 CLINICAL DATA:  Seizures and altered mental status. EXAM: CT HEAD WITHOUT CONTRAST TECHNIQUE: Contiguous axial images were obtained from the base of the skull through the vertex without intravenous contrast. COMPARISON:  01/18/2018 FINDINGS: Brain: Mild generalized atrophy. No evidence of old or acute focal infarction, mass lesion, hemorrhage, hydrocephalus or extra-axial collection. Vascular: There is atherosclerotic calcification of the major vessels at the base of the brain. Skull: Negative Sinuses/Orbits: Small amount of fluid in the left maxillary sinus. Orbits negative. Other: None IMPRESSION: No acute or significant finding.  No change since 3 days ago. Electronically Signed   By: Nelson Chimes M.D.   On: 01/21/2018 18:41   Ct Head Wo Contrast  Result Date: 01/18/2018 CLINICAL DATA:  67 year old female with vomiting, diarrhea. Recent seizure activity, history of seizures. EXAM: CT HEAD WITHOUT CONTRAST TECHNIQUE: Contiguous axial images were obtained from the base of the skull through the vertex without intravenous contrast. COMPARISON:  None. FINDINGS: Brain: Mild motion artifact. Cerebral volume is within normal limits for age. No midline shift, ventriculomegaly, mass effect, evidence of mass lesion, intracranial hemorrhage or evidence of cortically based acute infarction. Gray-white matter differentiation is within normal limits  throughout the brain. Vascular: Calcified atherosclerosis at the skull base. No suspicious intracranial vascular hyperdensity. Skull: Osteopenia.  No acute osseous abnormality identified. Sinuses/Orbits: Low-density bubbly opacity in the visible left maxillary sinus. Other paranasal sinuses are clear (incidental osteomas in the right frontal and ethmoid sinuses). Tympanic cavities and mastoids are clear. Other: Visualized scalp soft tissues are within normal limits. Negative visible orbits soft tissues. IMPRESSION: 1.  Normal for age non contrast CT appearance of the brain. 2. Partially visible bubbly opacity in the left maxillary sinus, consider sinusitis. Electronically Signed   By: Genevie Ann M.D.   On: 01/18/2018 12:25   Dg Chest Port 1 View  Result Date: 01/30/2018 CLINICAL DATA:  Sepsis. CHF, asthma-COPD, smoking history, coronary artery disease with stent placement. EXAM: PORTABLE CHEST 1 VIEW COMPARISON:  Portable chest x-ray of January 28, 2018 FINDINGS: The lungs are adequately inflated. The interstitial markings are mildly increased. The pulmonary vascularity is  engorged. The cardiac silhouette remains mildly enlarged. There is calcification in the wall of the aortic arch. The right internal jugular venous catheter tip projects at the junction of the middle and distal thirds of the SVC. Nerve stimulator electrodes project over the midthoracic spine. IMPRESSION: Mild CHF with interstitial edema.  No pneumonia. Thoracic aortic atherosclerosis. Electronically Signed   By: David  Martinique M.D.   On: 01/30/2018 07:32   Dg Chest Port 1 View  Result Date: 01/28/2018 CLINICAL DATA:  67 year old female with central line placement EXAM: PORTABLE CHEST 1 VIEW COMPARISON:  Chest radiograph dated 01/27/2018 FINDINGS: There has been interval placement of a right IJ central line the tip over the right atrium close to the cavoatrial junction. There is cardiomegaly. No focal consolidative changes, pleural effusion, or  pneumothorax. Lower cervical fixation hardware. No acute osseous pathology. IMPRESSION: Interval placement of a right IJ central venous line with tip over the right atrium close to the cavoatrial junction. No pneumothorax. No other interval change. Electronically Signed   By: Anner Crete M.D.   On: 01/28/2018 00:41   Dg Chest Port 1 View  Result Date: 01/27/2018 CLINICAL DATA:  Hypoxia EXAM: PORTABLE CHEST 1 VIEW COMPARISON:  01/22/2018 FINDINGS: Interval extubation. Lower cervical plate and screw fixator. Dorsal column stimulator noted. Atherosclerotic calcification of the aortic arch. Low lung volumes are present, causing crowding of the pulmonary vasculature. Mild enlargement of the cardiopericardial silhouette with indistinctness of the pulmonary vasculature suggesting pulmonary venous hypertension. Mild bilateral airway thickening. IMPRESSION: 1. Airway thickening is present, suggesting bronchitis or reactive airways disease. 2. Mild enlargement of the cardiopericardial silhouette with pulmonary venous hypertension. 3.  Atherosclerotic calcification of the aortic arch. 4. Interval extubation. Electronically Signed   By: Van Clines M.D.   On: 01/27/2018 20:28   Dg Chest Port 1 View  Result Date: 01/22/2018 CLINICAL DATA:  Endotracheal tube position EXAM: PORTABLE CHEST 1 VIEW COMPARISON:  01/21/2018 FINDINGS: Endotracheal tube 15 mm above the carina unchanged. Right arm PICC tip in the mid SVC unchanged. NG tube in the stomach. Slightly improved lung volume with mild improvement in bibasilar atelectasis. Negative for edema or effusion IMPRESSION: Improved lung volume with improvement in mild bibasilar atelectasis. Endotracheal tube 15 mm above the carina, unchanged. Electronically Signed   By: Franchot Gallo M.D.   On: 01/22/2018 08:52   Dg Chest Port 1 View  Result Date: 01/21/2018 CLINICAL DATA:  Acute respiratory failure EXAM: PORTABLE CHEST 1 VIEW COMPARISON:  01/20/2018 FINDINGS:  Endotracheal tube remains in good position. NG tube in the stomach. Right arm PICC tip in the mid SVC. Cardiac enlargement without heart failure or pneumonia. Mild bibasilar atelectasis. No effusion. Spinal cord stimulator noted. IMPRESSION: Support lines remain in good position. Mild bibasilar atelectasis unchanged from the prior study. Electronically Signed   By: Franchot Gallo M.D.   On: 01/21/2018 08:49   Dg Chest Port 1 View  Result Date: 01/20/2018 CLINICAL DATA:  ETT/VENT, HX N/V/D FOR SEVERAL DAYS.,CHF,COPD,HTN.ASTHMA,CKD EXAM: PORTABLE CHEST 1 VIEW COMPARISON:  Chest x-rays dated 01/19/2018 and 10/28/2017. FINDINGS: Stable cardiomegaly. Overall cardiomediastinal silhouette is stable. Endotracheal tube appears adequately positioned with tip approximately 4 cm above the carina. Enteric tube passes below the diaphragm. RIGHT-sided PICC line appears adequately positioned with tip at the level of the mid SVC. Stimulator hardware within the midline of the thoracic spine. Lungs are clear. No pneumothorax or significant pleural effusion is seen. No acute or suspicious osseous finding. Atherosclerotic changes again noted at the  aortic arch. IMPRESSION: 1. No active disease. No evidence of pneumonia or pulmonary edema on today's exam. 2. Support apparatus appears adequately positioned. 3. Stable cardiomegaly. 4. Aortic atherosclerosis. Electronically Signed   By: Franki Cabot M.D.   On: 01/20/2018 08:44   Dg Chest Port 1 View  Result Date: 01/19/2018 CLINICAL DATA:  67 y/o  F; ET tube placement. EXAM: PORTABLE CHEST 1 VIEW COMPARISON:  01/19/2018 chest radiograph. FINDINGS: Endotracheal tube tip projects 6.5 cm above carina. Enteric tube tip is below the field of view in the abdomen. Right PICC line tip projects over mid SVC. Stable cardiomegaly and aortic atherosclerosis. Mild interval decrease in pulmonary vascular congestion. No new consolidation, effusion, or pneumothorax. Bones are unremarkable.  Partially visualized anterior cervical fusion hardware noted. IMPRESSION: 1. Endotracheal tube tip projects 6.5 cm above carina. 2. Enteric tube tip extends below field of view into abdomen. 3. Decreased pulmonary vascular congestion.  Stable cardiomegaly. Electronically Signed   By: Kristine Garbe M.D.   On: 01/19/2018 17:20   Portable Chest X-ray  Result Date: 01/19/2018 CLINICAL DATA:  Status post endotracheal tube placement EXAM: PORTABLE CHEST 1 VIEW COMPARISON:  CT chest 10/28/2017 FINDINGS: Right-sided PICC line with the tip projecting over the cavoatrial junction. Endotracheal tube with the tip 6 cm above the carina. Nasogastric tube coursing below the diaphragm. Diffuse bilateral interstitial thickening. No pleural effusion or pneumothorax. Stable cardiomegaly. No aggressive osseous lesion. IMPRESSION: 1. Right-sided PICC line with the tip projecting over the cavoatrial junction. 2. Endotracheal tube with the tip 6 cm above the carina. 3. Nasogastric tube coursing below the diaphragm. 4. Findings concerning for mild CHF. Electronically Signed   By: Kathreen Devoid   On: 01/19/2018 11:48       Subjective: Continues to feel better.  No overnight events.  Discharge Exam: Vitals:   01/31/18 0747 01/31/18 0750  BP:    Pulse:    Resp:    Temp:    SpO2: 97% 97%   Vitals:   01/31/18 0611 01/31/18 0740 01/31/18 0747 01/31/18 0750  BP: (!) 179/71     Pulse: 76     Resp: 19     Temp: 98.4 F (36.9 C)     TempSrc: Oral     SpO2: 97% 97% 97% 97%  Weight:      Height:        General: Elderly female not in distress HEENT: Moist mucosa, supple neck Chest: Clear to auscultation bilaterally   CVS: Normal S1 and S2, no murmurs GI: Soft, nondistended, nontender Musculoskeletal: Warm, no edema     The results of significant diagnostics from this hospitalization (including imaging, microbiology, ancillary and laboratory) are listed below for reference.      Microbiology: Recent Results (from the past 240 hour(s))  Culture, blood (routine x 2)     Status: None   Collection Time: 01/21/18  5:40 PM  Result Value Ref Range Status   Specimen Description BLOOD RIGHT HAND  Final   Special Requests   Final    BOTTLES DRAWN AEROBIC AND ANAEROBIC Blood Culture adequate volume   Culture   Final    NO GROWTH 5 DAYS Performed at [facility], [address] [place], [place] [address]    Report Status 01/26/2018 FINAL  Final  Culture, blood (routine x 2)     Status: None   Collection Time: 01/21/18  5:41 PM  Result Value Ref Range Status   Specimen Description BLOOD RIGHT HAND  Final   Special Requests   Final    BOTTLES DRAWN AEROBIC AND ANAEROBIC Blood Culture adequate volume   Culture   Final    NO GROWTH 5 DAYS Performed at [facility], [address], [place], [place] [address]    Report Status 01/26/2018 FINAL  Final  Culture, respiratory (NON-Expectorated)     Status: None   Collection Time: 01/22/18 10:08 AM  Result Value Ref Range Status   Specimen Description TRACHEAL ASPIRATE  Final   Special Requests NONE  Final   Gram Stain   Final    ABUNDANT WBC PRESENT, PREDOMINANTLY PMN ABUNDANT GRAM POSITIVE COCCI Performed at [facility], [place] [address], [place], [place] [address]    Culture   Final    MODERATE METHICILLIN RESISTANT STAPHYLOCOCCUS AUREUS   Report Status 01/24/2018 FINAL  Final   Organism ID, Bacteria METHICILLIN RESISTANT STAPHYLOCOCCUS AUREUS  Final      Susceptibility   Methicillin resistant staphylococcus aureus - MIC*    CIPROFLOXACIN >=8 RESISTANT Resistant     ERYTHROMYCIN >=8 RESISTANT Resistant     GENTAMICIN <=0.5 SENSITIVE Sensitive     OXACILLIN >=4 RESISTANT Resistant     TETRACYCLINE 2 SENSITIVE Sensitive     VANCOMYCIN 1 SENSITIVE Sensitive     TRIMETH/SULFA <=10 SENSITIVE Sensitive     CLINDAMYCIN >=8 RESISTANT Resistant     RIFAMPIN <=0.5 SENSITIVE Sensitive     Inducible  Clindamycin NEGATIVE Sensitive     * MODERATE METHICILLIN RESISTANT STAPHYLOCOCCUS AUREUS  Culture, blood (Routine X 2) w Reflex to ID Panel     Status: None (Preliminary result)   Collection Time: 01/27/18  7:45 PM  Result Value Ref Range Status   Specimen Description BLOOD RIGHT ARM DRAWN BY RN  Final   Special Requests   Final    BOTTLES DRAWN AEROBIC AND ANAEROBIC Blood Culture adequate volume   Culture   Final    NO GROWTH 4 DAYS Performed at [facility], [address] [place], [place] [address]    Report Status PENDING  Incomplete  Culture, blood (Routine X 2) w Reflex to ID Panel     Status: None (Preliminary result)   Collection Time: 01/27/18  8:35 PM  Result Value Ref Range Status   Specimen Description BLOOD LEFT HAND DRAWN BY RN  Final   Special Requests   Final    BOTTLES DRAWN AEROBIC AND ANAEROBIC Blood Culture adequate volume   Culture   Final    NO GROWTH 4 DAYS Performed at [facility], [address], [place],  [address]    Report Status PENDING  Incomplete     Labs: BNP (last 3 results) Recent Labs    08/25/17 1020 10/28/17 1657  BNP 202.3* 56.7   Basic Metabolic Panel: Recent Labs  Lab 01/26/18 0931 01/27/18 1945 01/28/18 0323 01/29/18 0426 01/30/18 0352 01/31/18 0556  NA 142 136 135 138 138  --   K 4.7 3.2* 3.2* 5.1 5.5* 3.8  CL 92* 91* 98 108 107  --   CO2 30* _0 --   GLUCOSE 117* 128* 167* 99 83  --   BUN 21 34* 32* 22 14  --   CREATININE 1.16* 3.60* 2.41* 0.72 0.59  --   CALCIUM 10.3 8.6* 7.5* 8.4* 8.7*  --   MG 1.3*  --  1.3* 1.8 1.2* 1.3*   Liver Function Tests: Recent Labs  Lab 01/26/18 0931 01/27/18 1945 01/29/18  4193 01/30/18 0352  AST 34 _0 ALT _1 ALKPHOS 83 69 62 62  BILITOT <0.2 0.5 0.2* 0.5  PROT 7.2 7.8 5.8* 5.7*  ALBUMIN 3.8 3.4* 2.3* 2.4*   No results for input(s): LIPASE, AMYLASE in the last 168 hours. Recent Labs  Lab 01/27/18 2004  AMMONIA 21   CBC: Recent Labs  Lab  01/26/18 0931 01/27/18 1945 01/28/18 0323 01/29/18 0426 01/30/18 0352 01/31/18 0556  WBC 13.1* 23.3* 23.6* 12.1* 8.8 7.4  NEUTROABS 9.7* 19.7*  --  9.8* 6.9 5.4  HGB 13.8 13.2 11.2* 9.5* 9.4* 10.3*  HCT 42.9 42.0 36.4 32.2* 31.7* 33.1*  MCV 93 94.8 95.3 97.3 96.9 93.8  PLT 394 418* 387 270 283 290   Cardiac Enzymes: Recent Labs  Lab 01/27/18 1945 01/27/18 2342  CKTOTAL 223  --   TROPONINI <0.03 <0.03   BNP: Invalid input(s): POCBNP CBG: Recent Labs  Lab 01/24/18 1206 01/27/18 1947  GLUCAP 109* 130*   D-Dimer No results for input(s): DDIMER in the last 72 hours. Hgb A1c No results for input(s): HGBA1C in the last 72 hours. Lipid Profile No results for input(s): CHOL, HDL, LDLCALC, TRIG, CHOLHDL, LDLDIRECT in the last 72 hours. Thyroid function studies No results for input(s): TSH, T4TOTAL, T3FREE, THYROIDAB in the last 72 hours.  Invalid input(s): FREET3 Anemia work up No results for input(s): VITAMINB12, FOLATE, FERRITIN, TIBC, IRON, RETICCTPCT in the last 72 hours. Urinalysis    Component Value Date/Time   COLORURINE AMBER (A) 01/27/2018 2046   APPEARANCEUR CLOUDY (A) 01/27/2018 2046   LABSPEC 1.024 01/27/2018 2046   PHURINE 5.0 01/27/2018 2046   GLUCOSEU NEGATIVE 01/27/2018 2046   HGBUR NEGATIVE 01/27/2018 2046   BILIRUBINUR SMALL (A) 01/27/2018 2046   [place] NEGATIVE 01/27/2018 2046   PROTEINUR 30 (A) 01/27/2018 2046   NITRITE NEGATIVE 01/27/2018 2046   LEUKOCYTESUR NEGATIVE 01/27/2018 2046   Sepsis Labs Invalid input(s): PROCALCITONIN,  WBC,  LACTICIDVEN Microbiology Recent Results (from the past 240 hour(s))  Culture, blood (routine x 2)     Status: None   Collection Time: 01/21/18  5:40 PM  Result Value Ref Range Status   Specimen Description BLOOD RIGHT HAND  Final   Special Requests   Final    BOTTLES DRAWN AEROBIC AND ANAEROBIC Blood Culture adequate volume   Culture   Final    NO GROWTH 5 DAYS Performed at [facility],  [place] [address] [place], [place] [address]    Report Status 01/26/2018 FINAL  Final  Culture, blood (routine x 2)     Status: None   Collection Time: 01/21/18  5:41 PM  Result Value Ref Range Status   Specimen Description BLOOD RIGHT HAND  Final   Special Requests   Final    BOTTLES DRAWN AEROBIC AND ANAEROBIC Blood Culture adequate volume   Culture   Final    NO GROWTH 5 DAYS Performed at [facility], [place] [address] [place], [place] [address]    Report Status 01/26/2018 FINAL  Final  Culture, respiratory (NON-Expectorated)     Status: None   Collection Time: 01/22/18 10:08 AM  Result Value Ref Range Status   Specimen Description TRACHEAL ASPIRATE  Final   Special Requests NONE  Final   Gram Stain   Final    ABUNDANT WBC PRESENT, PREDOMINANTLY PMN ABUNDANT GRAM POSITIVE COCCI Performed at [facility], [place] [address] [place], [place] [address]  Culture   Final    MODERATE METHICILLIN RESISTANT STAPHYLOCOCCUS AUREUS   Report Status 01/24/2018 FINAL  Final   Organism ID, Bacteria METHICILLIN RESISTANT STAPHYLOCOCCUS AUREUS  Final      Susceptibility   Methicillin resistant staphylococcus aureus - MIC*    CIPROFLOXACIN >=8 RESISTANT Resistant     ERYTHROMYCIN >=8 RESISTANT Resistant     GENTAMICIN <=0.5 SENSITIVE Sensitive     OXACILLIN >=4 RESISTANT Resistant     TETRACYCLINE 2 SENSITIVE Sensitive     VANCOMYCIN 1 SENSITIVE Sensitive     TRIMETH/SULFA <=10 SENSITIVE Sensitive     CLINDAMYCIN >=8 RESISTANT Resistant     RIFAMPIN <=0.5 SENSITIVE Sensitive     Inducible Clindamycin NEGATIVE Sensitive     * MODERATE METHICILLIN RESISTANT STAPHYLOCOCCUS AUREUS  Culture, blood (Routine X 2) w Reflex to ID Panel     Status: None (Preliminary result)   Collection Time: 01/27/18  7:45 PM  Result Value Ref Range Status   Specimen Description BLOOD RIGHT ARM DRAWN BY RN  Final   Special Requests   Final    BOTTLES DRAWN AEROBIC AND ANAEROBIC Blood Culture adequate volume    Culture   Final    NO GROWTH 4 DAYS Performed at [facility], [address] [place], [place] [address]    Report Status PENDING  Incomplete  Culture, blood (Routine X 2) w Reflex to ID Panel     Status: None (Preliminary result)   Collection Time: 01/27/18  8:35 PM  Result Value Ref Range Status   Specimen Description BLOOD LEFT HAND DRAWN BY RN  Final   Special Requests   Final    BOTTLES DRAWN AEROBIC AND ANAEROBIC Blood Culture adequate volume   Culture   Final    NO GROWTH 4 DAYS Performed at [facility], [address] [place], [place] [address]    Report Status PENDING  Incomplete     Time coordinating discharge: 35 minutes  SIGNED:   Louellen Molder, MD  [facility] 01/31/2018, 9:45 AM Pager   If 7PM-7AM, please contact night-coverage [email or web address] Password [email or web address]

## 2018-01-31 NOTE — Consult Note (Signed)
   [facility] CM Inpatient Consult   01/31/2018  Tasha Clarke 01/29/1951 [id number]   Referral received from inpatient [facility] [place] Management program. Patient has had multiple hospitalizations and has a unplanned readmission risk score of 33% extreme.  Spoke with patient briefly over the phone. She states she can not talk long. Explained [facility] Care Management program to Tasha Clarke and verbal consent received.  Tasha Clarke reports she lives with her husband.  Denies having any concerns with medications or transportation. Primary Care Provider is Dr. Warrick Parisian (Maribel listed as doing transition of care calls).   Will make referral to [facility] for follow up for COPD.  Notification sent to inpatient RNCM to make aware [facility] will follow.   Marthenia Rolling, MSN-Ed, RN,BSN [facility] Liaison [phone]

## 2018-01-31 NOTE — Care Management Note (Signed)
Case Management Note  Patient Details  Name: Tasha Clarke MRN: [id number] Date of Birth: 02/07/1951  Expected Discharge Date:  01/31/18               Expected Discharge Plan:  [place]  In-House Referral:     Discharge planning Services  CM Consult  Post Acute Care Choice:  Home Health, Resumption of Svcs/PTA Provider   HH Arranged:   RN, PT [facility] Agency:  [place]  Status of Service:  Completed, signed off  If discussed at [place] of Stay Meetings, dates discussed:    Additional Comments: DC home today. Sunday [place], [facility] rep, aware of DC. Referral made to [facility] CM d/t pt having 4 admissions in 6 months.   Sherald Barge, RN 01/31/2018, 10:28 AM

## 2018-01-31 NOTE — Progress Notes (Signed)
Central line removed by Dr. Rick Duff from anesthesia this afternoon at 1300. Pressure dressing applied. Pt tolerated well with no complaints. Dressing remains clean, dry and intact. Educated patient regarding keeping dressing in place for 24 hours and monitoring for any new bleeding. Patient and husband verbalized understanding. Donavan Foil, RN

## 2018-01-31 NOTE — Progress Notes (Signed)
Discharge instructions reviewed with patient and her husband at bedside per her request to have him present. Given copy of AVS and prescriptions. Patient and husband verbalized understanding of instructions and state they already have follow-up with PCP scheduled. Home health to resume with [facility]. Patient has home portable O2 for ride home. Patient in stable condition awaiting nurse tech to bring w/c and accompany her off unit for discharge home. Donavan Foil, RN

## 2018-01-31 NOTE — Progress Notes (Signed)
Patient BP this afternoon is 175/70. Notified Dr. Clementeen Graham. Stated okay for discharge and she will resume home BP medication as instructed. Donavan Foil, RN

## 2018-01-31 NOTE — Progress Notes (Signed)
RN reported to RT that he took patient off BIPAP because she couldn't tolerate it any longer. Patient was placed back on 2 lpm nasal cannula.

## 2018-02-01 ENCOUNTER — Other Ambulatory Visit: Payer: Self-pay | Admitting: *Deleted

## 2018-02-01 ENCOUNTER — Telehealth: Payer: Self-pay | Admitting: *Deleted

## 2018-02-01 DIAGNOSIS — N183 Chronic kidney disease, stage 3 (moderate): Secondary | ICD-10-CM | POA: Diagnosis not present

## 2018-02-01 DIAGNOSIS — E1122 Type 2 diabetes mellitus with diabetic chronic kidney disease: Secondary | ICD-10-CM | POA: Diagnosis not present

## 2018-02-01 DIAGNOSIS — J449 Chronic obstructive pulmonary disease, unspecified: Secondary | ICD-10-CM | POA: Diagnosis not present

## 2018-02-01 DIAGNOSIS — I130 Hypertensive heart and chronic kidney disease with heart failure and stage 1 through stage 4 chronic kidney disease, or unspecified chronic kidney disease: Secondary | ICD-10-CM | POA: Diagnosis not present

## 2018-02-01 DIAGNOSIS — E876 Hypokalemia: Secondary | ICD-10-CM | POA: Diagnosis not present

## 2018-02-01 DIAGNOSIS — I509 Heart failure, unspecified: Secondary | ICD-10-CM | POA: Diagnosis not present

## 2018-02-01 DIAGNOSIS — R1310 Dysphagia, unspecified: Secondary | ICD-10-CM | POA: Diagnosis not present

## 2018-02-01 DIAGNOSIS — G894 Chronic pain syndrome: Secondary | ICD-10-CM | POA: Diagnosis not present

## 2018-02-01 DIAGNOSIS — G40901 Epilepsy, unspecified, not intractable, with status epilepticus: Secondary | ICD-10-CM | POA: Diagnosis not present

## 2018-02-01 LAB — CULTURE, BLOOD (ROUTINE X 2)
CULTURE: NO GROWTH
Culture: NO GROWTH
SPECIAL REQUESTS: ADEQUATE
SPECIAL REQUESTS: ADEQUATE

## 2018-02-01 NOTE — Telephone Encounter (Signed)
Incomplete for TCM

## 2018-02-01 NOTE — Patient Outreach (Signed)
Telephone call to patient to schedule initial home visit, spoke with patient's husband [name] who reports he has phone with him and he is not with pt, pt will not answer the phone, [name] states he is HCPOA, HIPAA verified, initial home visit scheduled for next week.  MD office provides transition of care.  [name] states pt has post hospital follow up appointment scheduled for 02/13/18, pt has home health coming out to see pt today.  PLAN See pt for initial home visit next week  Jacqlyn Larsen [facility], [place] Coordinator [phone]

## 2018-02-02 ENCOUNTER — Telehealth: Payer: Self-pay | Admitting: *Deleted

## 2018-02-02 DIAGNOSIS — J9600 Acute respiratory failure, unspecified whether with hypoxia or hypercapnia: Secondary | ICD-10-CM

## 2018-02-02 DIAGNOSIS — R0689 Other abnormalities of breathing: Secondary | ICD-10-CM

## 2018-02-02 DIAGNOSIS — Z720 Tobacco use: Secondary | ICD-10-CM

## 2018-02-02 NOTE — Telephone Encounter (Signed)
Saw Dr. Warrick Parisian on 01/26/18 needing to get oxygen from [facility] Need to get an order placed to fax to them Pt on 2L nasal cannula for POC

## 2018-02-02 NOTE — Telephone Encounter (Signed)
Order placed

## 2018-02-04 DIAGNOSIS — I509 Heart failure, unspecified: Secondary | ICD-10-CM | POA: Diagnosis not present

## 2018-02-04 DIAGNOSIS — G894 Chronic pain syndrome: Secondary | ICD-10-CM | POA: Diagnosis not present

## 2018-02-04 DIAGNOSIS — E876 Hypokalemia: Secondary | ICD-10-CM | POA: Diagnosis not present

## 2018-02-04 DIAGNOSIS — E1122 Type 2 diabetes mellitus with diabetic chronic kidney disease: Secondary | ICD-10-CM | POA: Diagnosis not present

## 2018-02-04 DIAGNOSIS — G40901 Epilepsy, unspecified, not intractable, with status epilepticus: Secondary | ICD-10-CM | POA: Diagnosis not present

## 2018-02-04 DIAGNOSIS — I130 Hypertensive heart and chronic kidney disease with heart failure and stage 1 through stage 4 chronic kidney disease, or unspecified chronic kidney disease: Secondary | ICD-10-CM | POA: Diagnosis not present

## 2018-02-04 DIAGNOSIS — R1310 Dysphagia, unspecified: Secondary | ICD-10-CM | POA: Diagnosis not present

## 2018-02-04 DIAGNOSIS — N183 Chronic kidney disease, stage 3 (moderate): Secondary | ICD-10-CM | POA: Diagnosis not present

## 2018-02-04 DIAGNOSIS — J449 Chronic obstructive pulmonary disease, unspecified: Secondary | ICD-10-CM | POA: Diagnosis not present

## 2018-02-07 ENCOUNTER — Other Ambulatory Visit: Payer: Self-pay | Admitting: *Deleted

## 2018-02-07 ENCOUNTER — Encounter: Payer: Self-pay | Admitting: *Deleted

## 2018-02-07 VITALS — BP 138/62 | HR 68 | Resp 20 | Ht 65.0 in | Wt 182.0 lb

## 2018-02-07 DIAGNOSIS — G40901 Epilepsy, unspecified, not intractable, with status epilepticus: Secondary | ICD-10-CM | POA: Diagnosis not present

## 2018-02-07 DIAGNOSIS — I509 Heart failure, unspecified: Secondary | ICD-10-CM | POA: Diagnosis not present

## 2018-02-07 DIAGNOSIS — E1122 Type 2 diabetes mellitus with diabetic chronic kidney disease: Secondary | ICD-10-CM | POA: Diagnosis not present

## 2018-02-07 DIAGNOSIS — I130 Hypertensive heart and chronic kidney disease with heart failure and stage 1 through stage 4 chronic kidney disease, or unspecified chronic kidney disease: Secondary | ICD-10-CM | POA: Diagnosis not present

## 2018-02-07 DIAGNOSIS — J449 Chronic obstructive pulmonary disease, unspecified: Secondary | ICD-10-CM | POA: Diagnosis not present

## 2018-02-07 DIAGNOSIS — G894 Chronic pain syndrome: Secondary | ICD-10-CM | POA: Diagnosis not present

## 2018-02-07 DIAGNOSIS — E876 Hypokalemia: Secondary | ICD-10-CM | POA: Diagnosis not present

## 2018-02-07 DIAGNOSIS — N183 Chronic kidney disease, stage 3 (moderate): Secondary | ICD-10-CM | POA: Diagnosis not present

## 2018-02-07 DIAGNOSIS — J441 Chronic obstructive pulmonary disease with (acute) exacerbation: Secondary | ICD-10-CM

## 2018-02-07 DIAGNOSIS — R1310 Dysphagia, unspecified: Secondary | ICD-10-CM | POA: Diagnosis not present

## 2018-02-07 NOTE — Patient Outreach (Signed)
Tasha Clarke [facility]) Care Management   02/07/2018   Subjective:  Initial home visit with pt, HIPAA verified, husband [name] present, pt reports "my only goal is to not go back into the hospital"  Pt reports she has nicotine patches but not ready to stop smoking yet.  Pt states home health RN, PT assisting her and [employer] case manager made home visit end of last week and " is checking into helping me get some new dentures"  Pt states she would like to get medications from [facility] mail order "but it's a lot of trouble to do and get set up because they're all refilled at different times"  Pt states " I am not a diabetic although it shows up in my record"   Objective:   Vitals:   02/07/18 1330  BP: 138/62  Pulse: 68  Resp: 20  SpO2: 95%  Weight: 182 lb (82.6 kg)  Height: 1.651 m (5\' 5" )   ROS  Physical Exam  Constitutional: She is oriented to person, place, and time. She appears well-developed and well-nourished.  HENT:  Head: Normocephalic.  Neck: Normal range of motion. Neck supple.  Cardiovascular: Normal rate.  Respiratory: Effort normal and breath sounds normal.  Dyspnea with exertion  GI: Soft. Bowel sounds are normal.  Musculoskeletal: She exhibits edema.  2+ edema LLE 1+ edema RLE  Neurological: She is alert and oriented to person, place, and time.  Skin: Skin is warm and dry.  Psychiatric: She has a normal mood and affect. Her behavior is normal. Judgment and thought content normal.    Encounter Medications:   Outpatient Encounter Medications as of 02/07/2018  Medication Sig Note  . albuterol (PROVENTIL HFA) 108 (90 Base) MCG/ACT inhaler Inhale into the lungs.   Marland Kitchen alendronate (FOSAMAX) 70 MG tablet Take 1 tablet (70 mg total) by mouth every Monday. Take with a full glass of water on an empty stomach.   Marland Kitchen aspirin EC 81 MG tablet Take 81 mg by mouth daily.   Marland Kitchen atorvastatin (LIPITOR) 40 MG tablet Take 40 mg by mouth daily.   . cetirizine (ZYRTEC) 10 MG tablet Take 1  tablet (10 mg total) by mouth daily.   . Cholecalciferol (VITAMIN D-3) 5000 units TABS Take 5,000 Units by mouth daily.   . cyclobenzaprine (FLEXERIL) 10 MG tablet Take 1 tablet (10 mg total) by mouth 2 (two) times daily as needed for muscle spasms.   . diphenhydramine-acetaminophen (TYLENOL PM EXTRA STRENGTH) 25-500 MG TABS tablet Take 1 tablet by mouth at bedtime as needed (pain).    . DULoxetine (CYMBALTA) 30 MG capsule Take 30 mg by mouth daily.   Marland Kitchen EPINEPHrine 0.3 mg/0.3 mL IJ SOAJ injection Inject 0.3 mg into the muscle as needed.   . Ferrous Sulfate (IRON) 325 (65 Fe) MG TABS Take 1 tablet (325 mg total) by mouth daily.   . fluticasone (FLONASE) 50 MCG/ACT nasal spray Place 2 sprays into both nostrils daily.   . fluticasone furoate-vilanterol (BREO ELLIPTA) 200-25 MCG/INH AEPB Inhale 1 puff into the lungs daily.   . Fluticasone-Salmeterol (ADVAIR) 250-50 MCG/DOSE AEPB Inhale 1 puff into the lungs 2 (two) times daily.   Marland Kitchen gabapentin (NEURONTIN) 600 MG tablet Take 1 tablet (600 mg total) by mouth 4 (four) times daily.   Marland Kitchen ipratropium-albuterol (DUONEB) 0.5-2.5 (3) MG/3ML SOLN Take 3 mLs by nebulization every 6 (six) hours as needed (shortness of breathe).    . levETIRAcetam (KEPPRA) 1000 MG tablet TAKE 1 TABLET BY MOUTH AT BEDTIME   .  levothyroxine (SYNTHROID, LEVOTHROID) 50 MCG tablet Take 50 mcg by mouth daily before breakfast.   . losartan (COZAAR) 100 MG tablet TAKE 1 2 (ONE HALF) TABLET BY MOUTH ONCE DAILY   . Magnesium 400 MG CAPS Take 400 mg by mouth daily.   . metoprolol tartrate (LOPRESSOR) 25 MG tablet Take 25 mg by mouth 2 (two) times daily.   . Morphine-Naltrexone (EMBEDA) 30-1.2 MG CPCR Take 1 capsule by mouth 2 (two) times daily.   . Multiple Vitamin (MULTIVITAMIN) tablet Take 1 tablet by mouth daily.   . naloxone (NARCAN) nasal spray 4 mg/0.1 mL Place into the nose.   . Omega-3 Fatty Acids (FISH OIL) 1000 MG CAPS Take 1,000 mg by mouth daily.   Marland Kitchen lacosamide (VIMPAT) 50 MG  TABS tablet Take 2 tablets (100 mg total) by mouth 2 (two) times daily for 1 day, THEN 1 tablet (50 mg total) 2 (two) times daily for 1 day.   . Magnesium Gluconate 550 MG TABS Take 1 tablet (550 mg total) by mouth daily. (Patient not taking: Reported on 02/07/2018) 01/27/2018: Pt is taking Magnesium OTC 400mg  1.5 tablets daily   . nicotine (NICODERM CQ) 21 mg/24hr patch Place 1 patch (21 mg total) onto the skin daily. (Patient not taking: Reported on 02/07/2018)    No facility-administered encounter medications on file as of 02/07/2018.     Functional Status:   In your present state of health, do you have any difficulty performing the following activities: 02/07/2018 01/28/2018  Hearing? Y N  Comment left ear some hearing loss -  Vision? N N  Difficulty concentrating or making decisions? N N  Walking or climbing stairs? Y Y  Dressing or bathing? N N  Doing errands, shopping? Y N  Preparing Food and eating ? N -  Using the Toilet? N -  In the past six months, have you accidently leaked urine? Y -  Do you have problems with loss of bowel control? N -  Managing your Medications? Y -  Managing your Finances? Y -  Housekeeping or managing your Housekeeping? Y -    Fall/Depression Screening:    Fall Risk  02/07/2018 01/26/2018 10/24/2017  Falls in the past year? Yes Yes Yes  Number falls in past yr: 1 1 2  or more  Injury with Fall? No No Yes  Comment - - wrist fracture, laceration  Risk for fall due to : History of fall(s) - -  Follow up Education provided;Falls evaluation completed;Falls prevention discussed - -   PHQ 2/9 Scores 02/07/2018 01/26/2018 10/24/2017 09/23/2017 08/24/2017  PHQ - 2 Score 0 0 2 1 3   PHQ- 9 Score - - 13 - 13    Assessment:  Patient's husband works during the day but is able to leave and come home if needed, pt is attending all appointments and will be seeing GI MD end August and to pain clinic 02/08/18, primary care 02/13/18. RN CM placed order for [facility] pharmacist to assist  transferring medications to [facility] mail order and any affordability issues.  Pt is taking morphine for pain and seeing pain specialist, states she is not a candidate for surgery. RN CM faxed initial home visit, barrier letter to primary MD Dr. Warrick Parisian.  [facility] CM Care Plan Problem One     Most Recent Value  Care Plan Problem One  Knowledge deficit related to COPD  Role Documenting the Problem One  Care Management Coordinator  Care Plan for Problem One  Active  [facility] Long Term  Goal   Pt will verbalize/ demonstrate improved self care related to COPD within 60 days  [facility] Long Term Goal Start Date  02/07/18  Interventions for Problem One Long Term Goal  RN CM gave [facility] calendar, EMMI handouts and reviewed, 24 hour nurse advice line, reviewed importance of smoking cessation (pt is not ready), pt has nicotine patches on hand  [facility] CM Short Term Goal #1   pt will verbalize COPD action plan/ zones within 30 days  [facility] CM Short Term Goal #1 Start Date  02/07/18  Interventions for Short Term Goal #1  RN CM gave pt copy of and reviewed COPD action plan/ zones, placed emphasis on yellow zone.    [facility] CM Care Plan Problem Two     Most Recent Value  Care Plan Problem Two  Decreased endurance/ fatigue  Care Plan for Problem Two  Active  [facility] CM Short Term Goal #1   pt will work with home health PT and complete prescribed exercises daily within 30 days  [facility] CM Short Term Goal #1 Start Date  02/07/18  Interventions for Short Term Goal #2   RN CM ask pt to walk throughout the house several times daily and try to complete prescribed exercises for PT       Plan: see pt for home visit in 3-4 weeks Collaborate with home health, [employer] case manager as needed  Jacqlyn Larsen [facility], [place] [phone]

## 2018-02-08 ENCOUNTER — Other Ambulatory Visit: Payer: Self-pay | Admitting: Pharmacist

## 2018-02-08 DIAGNOSIS — M533 Sacrococcygeal disorders, not elsewhere classified: Secondary | ICD-10-CM | POA: Diagnosis not present

## 2018-02-08 DIAGNOSIS — M961 Postlaminectomy syndrome, not elsewhere classified: Secondary | ICD-10-CM | POA: Diagnosis not present

## 2018-02-08 DIAGNOSIS — M47812 Spondylosis without myelopathy or radiculopathy, cervical region: Secondary | ICD-10-CM | POA: Diagnosis not present

## 2018-02-08 DIAGNOSIS — G894 Chronic pain syndrome: Secondary | ICD-10-CM | POA: Diagnosis not present

## 2018-02-08 DIAGNOSIS — Z9689 Presence of other specified functional implants: Secondary | ICD-10-CM | POA: Diagnosis not present

## 2018-02-08 NOTE — Patient Outreach (Signed)
[facility]) Care Management  02/08/2018  Tasha Clarke December 24, 1950 [id number]  67 y.o. year old female referred to [place] for Medication Assistance and Medication Management (medication review)  Referred by Jacqlyn Larsen, [place] for assistance with transferring medications to [place] as well as general difficulty in affording medications.   Was unable to reach patient via telephone today and have left HIPAA compliant message with patient's husband, Tasha Clarke introducing myself. Tasha Clarke states that patient has had a long day of appointments but she will call me back on a better day.  Plan: Will follow-up within 2-4  business days via telephone.   Ruben Reason, PharmD Clinical Pharmacist, [facility] [phone]

## 2018-02-09 ENCOUNTER — Ambulatory Visit (INDEPENDENT_AMBULATORY_CARE_PROVIDER_SITE_OTHER): Payer: Medicare PPO

## 2018-02-09 DIAGNOSIS — E876 Hypokalemia: Secondary | ICD-10-CM

## 2018-02-09 DIAGNOSIS — J449 Chronic obstructive pulmonary disease, unspecified: Secondary | ICD-10-CM

## 2018-02-09 DIAGNOSIS — D649 Anemia, unspecified: Secondary | ICD-10-CM

## 2018-02-09 DIAGNOSIS — G894 Chronic pain syndrome: Secondary | ICD-10-CM

## 2018-02-09 DIAGNOSIS — I509 Heart failure, unspecified: Secondary | ICD-10-CM | POA: Diagnosis not present

## 2018-02-09 DIAGNOSIS — F329 Major depressive disorder, single episode, unspecified: Secondary | ICD-10-CM

## 2018-02-09 DIAGNOSIS — N183 Chronic kidney disease, stage 3 (moderate): Secondary | ICD-10-CM

## 2018-02-09 DIAGNOSIS — E785 Hyperlipidemia, unspecified: Secondary | ICD-10-CM

## 2018-02-09 DIAGNOSIS — G40901 Epilepsy, unspecified, not intractable, with status epilepticus: Secondary | ICD-10-CM | POA: Diagnosis not present

## 2018-02-09 DIAGNOSIS — E1122 Type 2 diabetes mellitus with diabetic chronic kidney disease: Secondary | ICD-10-CM

## 2018-02-09 DIAGNOSIS — F419 Anxiety disorder, unspecified: Secondary | ICD-10-CM

## 2018-02-09 DIAGNOSIS — R1310 Dysphagia, unspecified: Secondary | ICD-10-CM

## 2018-02-09 DIAGNOSIS — F17210 Nicotine dependence, cigarettes, uncomplicated: Secondary | ICD-10-CM

## 2018-02-09 DIAGNOSIS — M810 Age-related osteoporosis without current pathological fracture: Secondary | ICD-10-CM

## 2018-02-09 DIAGNOSIS — I130 Hypertensive heart and chronic kidney disease with heart failure and stage 1 through stage 4 chronic kidney disease, or unspecified chronic kidney disease: Secondary | ICD-10-CM | POA: Diagnosis not present

## 2018-02-09 DIAGNOSIS — G4730 Sleep apnea, unspecified: Secondary | ICD-10-CM

## 2018-02-09 DIAGNOSIS — K219 Gastro-esophageal reflux disease without esophagitis: Secondary | ICD-10-CM

## 2018-02-10 ENCOUNTER — Other Ambulatory Visit: Payer: Self-pay | Admitting: *Deleted

## 2018-02-10 DIAGNOSIS — I509 Heart failure, unspecified: Secondary | ICD-10-CM | POA: Diagnosis not present

## 2018-02-10 DIAGNOSIS — G40901 Epilepsy, unspecified, not intractable, with status epilepticus: Secondary | ICD-10-CM | POA: Diagnosis not present

## 2018-02-10 DIAGNOSIS — J449 Chronic obstructive pulmonary disease, unspecified: Secondary | ICD-10-CM | POA: Diagnosis not present

## 2018-02-10 DIAGNOSIS — E1122 Type 2 diabetes mellitus with diabetic chronic kidney disease: Secondary | ICD-10-CM | POA: Diagnosis not present

## 2018-02-10 DIAGNOSIS — E876 Hypokalemia: Secondary | ICD-10-CM | POA: Diagnosis not present

## 2018-02-10 DIAGNOSIS — N183 Chronic kidney disease, stage 3 (moderate): Secondary | ICD-10-CM | POA: Diagnosis not present

## 2018-02-10 DIAGNOSIS — G894 Chronic pain syndrome: Secondary | ICD-10-CM | POA: Diagnosis not present

## 2018-02-10 DIAGNOSIS — I130 Hypertensive heart and chronic kidney disease with heart failure and stage 1 through stage 4 chronic kidney disease, or unspecified chronic kidney disease: Secondary | ICD-10-CM | POA: Diagnosis not present

## 2018-02-10 DIAGNOSIS — R1310 Dysphagia, unspecified: Secondary | ICD-10-CM | POA: Diagnosis not present

## 2018-02-10 NOTE — Patient Outreach (Signed)
Telephone call to Evonnie Dawes [facility] case manager that visited pt at home) at [phone], no answer to telephone, left voicemail requesting return phone call for collaboration regarding plan of care, resources.  Jacqlyn Larsen [facility], [place] Coordinator [phone]

## 2018-02-13 ENCOUNTER — Other Ambulatory Visit: Payer: Self-pay | Admitting: Family Medicine

## 2018-02-13 ENCOUNTER — Ambulatory Visit: Payer: Medicare PPO | Admitting: Family Medicine

## 2018-02-13 ENCOUNTER — Other Ambulatory Visit: Payer: Self-pay | Admitting: Pharmacist

## 2018-02-13 ENCOUNTER — Encounter: Payer: Self-pay | Admitting: Family Medicine

## 2018-02-13 ENCOUNTER — Ambulatory Visit: Payer: Self-pay | Admitting: Pharmacist

## 2018-02-13 VITALS — BP 136/79 | HR 75 | Temp 97.6°F

## 2018-02-13 DIAGNOSIS — J439 Emphysema, unspecified: Secondary | ICD-10-CM | POA: Diagnosis not present

## 2018-02-13 DIAGNOSIS — J15212 Pneumonia due to Methicillin resistant Staphylococcus aureus: Secondary | ICD-10-CM | POA: Diagnosis not present

## 2018-02-13 MED ORDER — LOSARTAN POTASSIUM 100 MG PO TABS: 50.0000 mg | ORAL_TABLET | Freq: Every day | ORAL | 3 refills | Status: DC

## 2018-02-13 MED ORDER — LEVETIRACETAM 750 MG PO TABS: 750.0000 mg | ORAL_TABLET | Freq: Every day | ORAL | 1 refills | Status: DC

## 2018-02-13 NOTE — Progress Notes (Signed)
BP 136/79   Pulse 75   Temp 97.6 F (36.4 C) (Oral)   SpO2 95% Comment: with oxygen   Subjective:    Patient ID: Tasha Clarke, female    DOB: [birth year], 67 y.o.   MRN: [id number]  HPI: Tasha Clarke is a 67 y.o. female presenting on 02/13/2018 for COPD (2 week follow up); Diarrhea (patient was in the hospital 7/9 and states she is still having diarrhea. Patient states that it does not happen as often but she still has it.); and Hospitalization Follow-up (7/19- sepsis)   HPI Hospital follow-up for diarrhea and pneumonia and COPD Patient was in the hospital on 01/17/2018 for diarrhea and again on 01/27/2018 for sepsis and pneumonia.  She was discharged on 01/31/2018.  She says her breathing is doing a lot better but she still has a little bit of diarrhea from the antibiotic that she was sent home on.  She does have follow-up with GI for suspicious masslike lesion on her CT abdomen that has caused some upset stomach.  She says the diarrhea is only a couple times a day.  Her breathing is back to her baseline and she is on 2 L nasal cannula.  Relevant past medical, surgical, family and social history reviewed and updated as indicated. Interim medical history since our last visit reviewed. Allergies and medications reviewed and updated.  Review of Systems  Constitutional: Negative for chills and fever.  Eyes: Negative for visual disturbance.  Respiratory: Positive for cough and wheezing. Negative for chest tightness and shortness of breath.   Cardiovascular: Negative for chest pain and leg swelling.  Gastrointestinal: Positive for diarrhea. Negative for abdominal pain, anal bleeding and blood in stool.  Musculoskeletal: Negative for back pain and gait problem.  Skin: Negative for rash.  Neurological: Negative for light-headedness and headaches.  Psychiatric/Behavioral: Negative for agitation and behavioral problems.  All other systems reviewed and are negative.   Per HPI unless  specifically indicated above   Allergies as of 02/13/2018      Reactions   Cephalexin Hives   Ketorolac Tromethamine Hives   Lac Bovis Nausea And Vomiting   Imdur  [isosorbide Dinitrate]    Iodinated Diagnostic Agents    Other Other (See Comments)   Alka seltzer causes blurred vision, fainting   Sodium Bicarbonate-citric Acid    Milk-related Compounds Nausea And Vomiting      Medication List        Accurate as of 02/13/18 11:53 AM. Always use your most recent med list.          alendronate 70 MG tablet Commonly known as:  FOSAMAX Take 1 tablet (70 mg total) by mouth every Monday. Take with a full glass of water on an empty stomach.   aspirin EC 81 MG tablet Take 81 mg by mouth daily.   atorvastatin 40 MG tablet Commonly known as:  LIPITOR Take 40 mg by mouth daily.   cetirizine 10 MG tablet Commonly known as:  ZYRTEC Take 1 tablet (10 mg total) by mouth daily.   cyclobenzaprine 10 MG tablet Commonly known as:  FLEXERIL Take 1 tablet (10 mg total) by mouth 2 (two) times daily as needed for muscle spasms.   DULoxetine 30 MG capsule Commonly known as:  CYMBALTA Take 30 mg by mouth daily.   EPINEPHrine 0.3 mg/0.3 mL Soaj injection Commonly known as:  EPI-PEN Inject 0.3 mg into the muscle as needed.   Fish Oil 1000 MG Caps Take 1,000 mg by  mouth daily.   fluticasone 50 MCG/ACT nasal spray Commonly known as:  FLONASE Place 2 sprays into both nostrils daily.   fluticasone furoate-vilanterol 200-25 MCG/INH Aepb Commonly known as:  BREO ELLIPTA Inhale 1 puff into the lungs daily.   Fluticasone-Salmeterol 250-50 MCG/DOSE Aepb Commonly known as:  ADVAIR Inhale 1 puff into the lungs 2 (two) times daily.   gabapentin 600 MG tablet Commonly known as:  NEURONTIN Take 1 tablet (600 mg total) by mouth 4 (four) times daily.   ipratropium-albuterol 0.5-2.5 (3) MG/3ML Soln Commonly known as:  DUONEB Take 3 mLs by nebulization every 6 (six) hours as needed (shortness of  breathe).   Iron 325 (65 Fe) MG Tabs Take 1 tablet (325 mg total) by mouth daily.   lacosamide 50 MG Tabs tablet Commonly known as:  VIMPAT Take 2 tablets (100 mg total) by mouth 2 (two) times daily for 1 day, THEN 1 tablet (50 mg total) 2 (two) times daily for 1 day. Start taking on:  01/25/2018   levETIRAcetam 1000 MG tablet Commonly known as:  KEPPRA TAKE 1 TABLET BY MOUTH AT BEDTIME   levETIRAcetam 750 MG tablet Commonly known as:  KEPPRA Take 1 tablet (750 mg total) by mouth daily. Take 750 in a.m. and 1000 in p.m.   levothyroxine 50 MCG tablet Commonly known as:  SYNTHROID, LEVOTHROID Take 50 mcg by mouth daily before breakfast.   losartan 100 MG tablet Commonly known as:  COZAAR Take 0.5 tablets (50 mg total) by mouth daily.   Magnesium 400 MG Caps Take 400 mg by mouth daily.   Magnesium Gluconate 550 MG Tabs Take 1 tablet (550 mg total) by mouth daily.   metoprolol tartrate 25 MG tablet Commonly known as:  LOPRESSOR Take 25 mg by mouth 2 (two) times daily.   multivitamin tablet Take 1 tablet by mouth daily.   NARCAN 4 MG/0.1ML Liqd nasal spray kit Generic drug:  naloxone Place into the nose.   nicotine 21 mg/24hr patch Commonly known as:  NICODERM CQ Place 1 patch (21 mg total) onto the skin daily.   NORCO 10-325 MG tablet Generic drug:  HYDROcodone-acetaminophen Take 1 tablet by mouth every 6 (six) hours.   PROVENTIL HFA 108 (90 Base) MCG/ACT inhaler Generic drug:  albuterol Inhale into the lungs.   TYLENOL PM EXTRA STRENGTH 25-500 MG Tabs tablet Generic drug:  diphenhydramine-acetaminophen Take 1 tablet by mouth at bedtime as needed (pain).   Vitamin D-3 5000 units Tabs Take 5,000 Units by mouth daily.          Objective:    BP 136/79   Pulse 75   Temp 97.6 F (36.4 C) (Oral)   SpO2 95% Comment: with oxygen  Wt Readings from Last 3 Encounters:  02/07/18 182 lb (82.6 kg)  01/31/18 195 lb 8.8 oz (88.7 kg)  01/24/18 182 lb 8.7 oz (82.8  kg)    Physical Exam  Constitutional: She is oriented to person, place, and time. She appears well-developed and well-nourished. No distress.  Eyes: Conjunctivae are normal.  Neck: Neck supple. No thyromegaly present.  Cardiovascular: Normal rate, regular rhythm, normal heart sounds and intact distal pulses.  No murmur heard. Pulmonary/Chest: Effort normal and breath sounds normal. No respiratory distress. She has no wheezes.  Abdominal: Soft. Bowel sounds are normal. She exhibits no distension. There is no tenderness. There is no guarding.  Musculoskeletal: Normal range of motion. She exhibits no edema.  Lymphadenopathy:    She has no cervical adenopathy.  Neurological: She is alert and oriented to person, place, and time. Coordination normal.  Skin: Skin is warm and dry. No rash noted. She is not diaphoretic.  Psychiatric: She has a normal mood and affect. Her behavior is normal.  Nursing note and vitals reviewed.      Assessment & Plan:   Problem List Items Addressed This Visit      Respiratory   COPD (chronic obstructive pulmonary disease) ([place])   Relevant Orders   CBC with Differential/Platelet (Completed)   CMP14+EGFR (Completed)   MRSA (methicillin resistant staphylococcus aureus) pneumonia ([place]) - Primary   Relevant Orders   CBC with Differential/Platelet (Completed)   CMP14+EGFR (Completed)       Follow up plan: Return in about 1 month (around 03/13/2018), or if symptoms worsen or fail to improve, for copd.  Counseling provided for all of the vaccine components Orders Placed This Encounter  Procedures  . CBC with Differential/Platelet  . [place], MD [facility] 02/13/2018, 11:53 AM

## 2018-02-13 NOTE — Patient Outreach (Signed)
[place] [facility]) Care Management  02/13/2018  Tasha Clarke 1950/11/24 [id number]   67 y.o. year old female referred to [facility] for Medication Assistance and Medication Management (medication review)  Referred by Tasha Clarke, [place] for assistance with transferring medications to [place] as well as general difficulty in affording medications.   Was unable to reach patient via telephone today and have left HIPAA compliant message with patient's husband, Tasha Clarke (unsuccessful outreach #2). Tasha Clarke again is not feeling up to speaking with me due to a doctor's appointment this morning. Tasha Clarke asks that I wait for [name] to call me back as she has my contact information.    Plan: Will allow 10 days from initial outreach (02/08/18) for patient to respond before closing case.  Tasha Clarke, PharmD

## 2018-02-14 DIAGNOSIS — R1310 Dysphagia, unspecified: Secondary | ICD-10-CM | POA: Diagnosis not present

## 2018-02-14 DIAGNOSIS — J449 Chronic obstructive pulmonary disease, unspecified: Secondary | ICD-10-CM | POA: Diagnosis not present

## 2018-02-14 DIAGNOSIS — I509 Heart failure, unspecified: Secondary | ICD-10-CM | POA: Diagnosis not present

## 2018-02-14 DIAGNOSIS — I130 Hypertensive heart and chronic kidney disease with heart failure and stage 1 through stage 4 chronic kidney disease, or unspecified chronic kidney disease: Secondary | ICD-10-CM | POA: Diagnosis not present

## 2018-02-14 DIAGNOSIS — E876 Hypokalemia: Secondary | ICD-10-CM | POA: Diagnosis not present

## 2018-02-14 DIAGNOSIS — G894 Chronic pain syndrome: Secondary | ICD-10-CM | POA: Diagnosis not present

## 2018-02-14 DIAGNOSIS — E1122 Type 2 diabetes mellitus with diabetic chronic kidney disease: Secondary | ICD-10-CM | POA: Diagnosis not present

## 2018-02-14 DIAGNOSIS — G40901 Epilepsy, unspecified, not intractable, with status epilepticus: Secondary | ICD-10-CM | POA: Diagnosis not present

## 2018-02-14 DIAGNOSIS — N183 Chronic kidney disease, stage 3 (moderate): Secondary | ICD-10-CM | POA: Diagnosis not present

## 2018-02-14 LAB — CMP14+EGFR
A/G RATIO: 1.4 (ref 1.2–2.2)
ALK PHOS: 86 IU/L (ref 39–117)
ALT: 9 IU/L (ref 0–32)
AST: 21 IU/L (ref 0–40)
Albumin: 3.8 g/dL (ref 3.6–4.8)
BUN/Creatinine Ratio: 16 (ref 12–28)
BUN: 10 mg/dL (ref 8–27)
CHLORIDE: 103 mmol/L (ref 96–106)
CO2: 22 mmol/L (ref 20–29)
Calcium: 9.2 mg/dL (ref 8.7–10.3)
Creatinine, Ser: 0.62 mg/dL (ref 0.57–1.00)
GFR calc Af Amer: 109 mL/min/{1.73_m2} (ref >59)
GFR calc non Af Amer: 94 mL/min/{1.73_m2} (ref >59)
Globulin, Total: 2.8 g/dL (ref 1.5–4.5)
Glucose: 90 mg/dL (ref 65–99)
POTASSIUM: 5.5 mmol/L — AB (ref 3.5–5.2)
Sodium: 141 mmol/L (ref 134–144)
Total Protein: 6.6 g/dL (ref 6.0–8.5)

## 2018-02-14 LAB — CBC WITH DIFFERENTIAL/PLATELET
BASOS: 1 %
Basophils Absolute: 0 10*3/uL (ref 0.0–0.2)
EOS (ABSOLUTE): 0.2 10*3/uL (ref 0.0–0.4)
Eos: 3 %
Hematocrit: 35.5 % (ref 34.0–46.6)
Hemoglobin: 11.3 g/dL (ref 11.1–15.9)
IMMATURE GRANULOCYTES: 1 %
Immature Grans (Abs): 0.1 10*3/uL (ref 0.0–0.1)
Lymphocytes Absolute: 1.2 10*3/uL (ref 0.7–3.1)
Lymphs: 16 %
MCH: 29.7 pg (ref 26.6–33.0)
MCHC: 31.8 g/dL (ref 31.5–35.7)
MCV: 93 fL (ref 79–97)
MONOS ABS: 0.4 10*3/uL (ref 0.1–0.9)
Monocytes: 5 %
NEUTROS PCT: 74 %
Neutrophils Absolute: 5.8 10*3/uL (ref 1.4–7.0)
PLATELETS: 323 10*3/uL (ref 150–450)
RBC: 3.8 x10E6/uL (ref 3.77–5.28)
RDW: 17.9 % — ABNORMAL HIGH (ref 12.3–15.4)
WBC: 7.8 10*3/uL (ref 3.4–10.8)

## 2018-02-15 DIAGNOSIS — Z0289 Encounter for other administrative examinations: Secondary | ICD-10-CM

## 2018-02-16 DIAGNOSIS — I509 Heart failure, unspecified: Secondary | ICD-10-CM | POA: Diagnosis not present

## 2018-02-16 DIAGNOSIS — J449 Chronic obstructive pulmonary disease, unspecified: Secondary | ICD-10-CM | POA: Diagnosis not present

## 2018-02-16 DIAGNOSIS — R1310 Dysphagia, unspecified: Secondary | ICD-10-CM | POA: Diagnosis not present

## 2018-02-16 DIAGNOSIS — G894 Chronic pain syndrome: Secondary | ICD-10-CM | POA: Diagnosis not present

## 2018-02-16 DIAGNOSIS — E876 Hypokalemia: Secondary | ICD-10-CM | POA: Diagnosis not present

## 2018-02-16 DIAGNOSIS — E1122 Type 2 diabetes mellitus with diabetic chronic kidney disease: Secondary | ICD-10-CM | POA: Diagnosis not present

## 2018-02-16 DIAGNOSIS — G40901 Epilepsy, unspecified, not intractable, with status epilepticus: Secondary | ICD-10-CM | POA: Diagnosis not present

## 2018-02-16 DIAGNOSIS — I130 Hypertensive heart and chronic kidney disease with heart failure and stage 1 through stage 4 chronic kidney disease, or unspecified chronic kidney disease: Secondary | ICD-10-CM | POA: Diagnosis not present

## 2018-02-16 DIAGNOSIS — N183 Chronic kidney disease, stage 3 (moderate): Secondary | ICD-10-CM | POA: Diagnosis not present

## 2018-02-17 ENCOUNTER — Ambulatory Visit: Payer: Medicare PPO | Admitting: Family Medicine

## 2018-02-21 ENCOUNTER — Other Ambulatory Visit (HOSPITAL_COMMUNITY): Payer: Medicare PPO

## 2018-02-22 ENCOUNTER — Ambulatory Visit: Payer: Self-pay | Admitting: Pharmacist

## 2018-02-22 ENCOUNTER — Other Ambulatory Visit: Payer: Self-pay | Admitting: Pharmacist

## 2018-02-22 DIAGNOSIS — J449 Chronic obstructive pulmonary disease, unspecified: Secondary | ICD-10-CM | POA: Diagnosis not present

## 2018-02-22 NOTE — Patient Outreach (Signed)
[facility]) Care Management  02/22/2018  Tasha Clarke [birth year] [id number]   67 y.o. year old female referred to [place] for Case Closure  Referred to [place] services for assistance transferring medications to [place] as well as general difficulty affording medications.   Was unable to reach patient via telephone today (unsuccessful outreach #3).  Plan: Will close pharmacy episode per protocol. Patient has my contact information and is welcome to call me at any time in the future. Will alert other care team members of pharmacy episode closure.   Ruben Reason, PharmD Clinical Pharmacist, [facility] [phone]

## 2018-02-28 ENCOUNTER — Ambulatory Visit (HOSPITAL_COMMUNITY): Payer: Medicare PPO | Admitting: Hematology

## 2018-02-28 ENCOUNTER — Encounter: Payer: Self-pay | Admitting: *Deleted

## 2018-02-28 ENCOUNTER — Other Ambulatory Visit: Payer: Self-pay | Admitting: *Deleted

## 2018-02-28 NOTE — Patient Outreach (Signed)
[facility] [facility]) Care Management   02/28/2018  Tasha Clarke 1951-03-17 [id number]  Tasha Clarke is an 67 y.o. female  Subjective: Routine home visit with pt, HIPAA verified, pt reports " I'm doing much better"  Pt reports she found out there are no resources for dentures as medicare will not reimburse so pt states her spouse is going to purchase the dentures, pt has a dentist that she can pay monthly payments.  Pt states she still smokes, is not wearing nicoderm patch, reports now eating greek yogurt because she was not getting enough protein, has diarrhea on occasion and eating the yogurt has helped.  Objective:   Vitals:   02/28/18 1416  BP: 110/62  Pulse: 72  Resp: 18  SpO2: 98%  Weight: 182 lb (82.6 kg)   ROS  Physical Exam  Constitutional: She is oriented to person, place, and time. She appears well-developed and well-nourished.  HENT:  Head: Normocephalic.  Neck: Normal range of motion. Neck supple.  Cardiovascular: Normal rate.  Respiratory: Effort normal.  Dyspnea with exertion  GI: Soft. Bowel sounds are normal.  Musculoskeletal: Normal range of motion. She exhibits no edema.  Neurological: She is alert and oriented to person, place, and time.  Skin: Skin is warm and dry.  Psychiatric: She has a normal mood and affect. Her behavior is normal. Judgment and thought content normal.    Encounter Medications:   Outpatient Encounter Medications as of 02/28/2018  Medication Sig Note  . albuterol (PROVENTIL HFA) 108 (90 Base) MCG/ACT inhaler Inhale into the lungs.   Marland Kitchen alendronate (FOSAMAX) 70 MG tablet Take 1 tablet (70 mg total) by mouth every Monday. Take with a full glass of water on an empty stomach.   Marland Kitchen aspirin EC 81 MG tablet Take 81 mg by mouth daily.   Marland Kitchen atorvastatin (LIPITOR) 40 MG tablet Take 40 mg by mouth daily.   . cetirizine (ZYRTEC) 10 MG tablet Take 1 tablet (10 mg total) by mouth daily.   . Cholecalciferol (VITAMIN D-3) 5000 units TABS  Take 5,000 Units by mouth daily.   . cyclobenzaprine (FLEXERIL) 10 MG tablet Take 1 tablet (10 mg total) by mouth 2 (two) times daily as needed for muscle spasms.   . diphenhydramine-acetaminophen (TYLENOL PM EXTRA STRENGTH) 25-500 MG TABS tablet Take 1 tablet by mouth at bedtime as needed (pain).    . DULoxetine (CYMBALTA) 30 MG capsule Take 30 mg by mouth daily.   Marland Kitchen EPINEPHrine 0.3 mg/0.3 mL IJ SOAJ injection Inject 0.3 mg into the muscle as needed.   . Ferrous Sulfate (IRON) 325 (65 Fe) MG TABS Take 1 tablet (325 mg total) by mouth daily.   . fluticasone (FLONASE) 50 MCG/ACT nasal spray Place 2 sprays into both nostrils daily.   . fluticasone furoate-vilanterol (BREO ELLIPTA) 200-25 MCG/INH AEPB Inhale 1 puff into the lungs daily.   . Fluticasone-Salmeterol (ADVAIR) 250-50 MCG/DOSE AEPB Inhale 1 puff into the lungs 2 (two) times daily.   Marland Kitchen gabapentin (NEURONTIN) 600 MG tablet Take 1 tablet (600 mg total) by mouth 4 (four) times daily.   Marland Kitchen HYDROcodone-acetaminophen (NORCO) 10-325 MG tablet Take 1 tablet by mouth every 6 (six) hours.   Marland Kitchen ipratropium-albuterol (DUONEB) 0.5-2.5 (3) MG/3ML SOLN Take 3 mLs by nebulization every 6 (six) hours as needed (shortness of breathe).    . levETIRAcetam (KEPPRA) 1000 MG tablet TAKE 1 TABLET BY MOUTH AT BEDTIME   . levETIRAcetam (KEPPRA) 750 MG tablet Take 1 tablet (750 mg total)  by mouth daily. Take 750 in a.m. and 1000 in p.m.   Marland Kitchen levothyroxine (SYNTHROID, LEVOTHROID) 50 MCG tablet Take 50 mcg by mouth daily before breakfast.   . losartan (COZAAR) 100 MG tablet Take 0.5 tablets (50 mg total) by mouth daily.   . Magnesium Gluconate 550 MG TABS Take 1 tablet (550 mg total) by mouth daily. 01/27/2018: Pt is taking Magnesium OTC 457m 1.5 tablets daily   . metoprolol tartrate (LOPRESSOR) 25 MG tablet Take 25 mg by mouth 2 (two) times daily.   . Multiple Vitamin (MULTIVITAMIN) tablet Take 1 tablet by mouth daily.   . naloxone (NARCAN) nasal spray 4 mg/0.1 mL Place  into the nose.   . Omega-3 Fatty Acids (FISH OIL) 1000 MG CAPS Take 1,000 mg by mouth daily.   .Marland Kitchenlacosamide (VIMPAT) 50 MG TABS tablet Take 2 tablets (100 mg total) by mouth 2 (two) times daily for 1 day, THEN 1 tablet (50 mg total) 2 (two) times daily for 1 day.   . Magnesium 400 MG CAPS Take 400 mg by mouth daily.   . nicotine (NICODERM CQ) 21 mg/24hr patch Place 1 patch (21 mg total) onto the skin daily. (Patient not taking: Reported on 02/28/2018)    No facility-administered encounter medications on file as of 02/28/2018.     Functional Status:   In your present state of health, do you have any difficulty performing the following activities: 02/07/2018 01/28/2018  Hearing? Y N  Comment left ear some hearing loss -  Vision? N N  Difficulty concentrating or making decisions? N N  Walking or climbing stairs? Y Y  Dressing or bathing? N N  Doing errands, shopping? Y N  Preparing Food and eating ? N -  Using the Toilet? N -  In the past six months, have you accidently leaked urine? Y -  Do you have problems with loss of bowel control? N -  Managing your Medications? Y -  Managing your Finances? Y -  Housekeeping or managing your Housekeeping? Y -    Fall/Depression Screening:    Fall Risk  02/13/2018 02/07/2018 01/26/2018  Falls in the past year? Yes Yes Yes  Number falls in past yr: '1 1 1  ' Injury with Fall? No No No  Comment - - -  Risk for fall due to : - History of fall(s) -  Follow up - Education provided;Falls evaluation completed;Falls prevention discussed -   PHQ 2/9 Scores 02/13/2018 02/07/2018 01/26/2018 10/24/2017 09/23/2017 08/24/2017  PHQ - 2 Score 2 0 0 '2 1 3  ' PHQ- 9 Score 11 - - 13 - 13    Assessment:  RN CM reviewed medications with pt, pt appears to feel much better today as compared to last home visit, pt attending all appointments, spouse is supportive.  THN CM Care Plan Problem One     Most Recent Value  Care Plan Problem One  Knowledge deficit related to COPD  Role  Documenting the Problem One  Care Management Coordinator  Care Plan for Problem One  Active  THN Long Term Goal   Pt will verbalize/ demonstrate improved self care related to COPD within 60 days  THN Long Term Goal Start Date  02/07/18  Interventions for Problem One Long Term Goal  RN CM reviewed recent appointments with primary care on 02/13/18, pain management 02/08/18 and to see GI end of August, reviewed no resources for dentures/ medicare does not reimburse, pt states she will pay out of pocket and she  has dentist that accepts monthly payments, pt will proceed forward with getting dentures.  THN CM Short Term Goal #1   pt will verbalize COPD action plan/ zones within 30 days  THN CM Short Term Goal #1 Start Date  02/28/18 [goal restarted]  Interventions for Short Term Goal #1  RN CM reinforced COPD action plan with emphasis on yellow zone, pt does still smoke, not ready to quit at present    [place] Problem Two     Most Recent Value  Care Plan Problem Two  Decreased endurance/ fatigue  Role Documenting the Problem Two  Care Management Coordinator  Care Plan for Problem Two  Active  THN CM Short Term Goal #1   pt will work with home health PT and complete prescribed exercises daily within 30 days  THN CM Short Term Goal #1 Start Date  02/07/18  [facility] CM Short Term Goal #1 Met Date   02/28/18  Interventions for Short Term Goal #2   PT has discharged pt,  pt reports she feels much better and stronger      Plan: see pt for home visit next month Transfer to RN health coach next month  Jacqlyn Larsen [facility] [place] Coordinator [phone]

## 2018-03-09 ENCOUNTER — Telehealth: Payer: Self-pay | Admitting: Family Medicine

## 2018-03-10 ENCOUNTER — Encounter: Payer: Self-pay | Admitting: *Deleted

## 2018-03-10 ENCOUNTER — Other Ambulatory Visit: Payer: Self-pay

## 2018-03-10 ENCOUNTER — Ambulatory Visit: Payer: Medicare PPO | Admitting: Nurse Practitioner

## 2018-03-10 ENCOUNTER — Other Ambulatory Visit (HOSPITAL_COMMUNITY)
Admission: RE | Admit: 2018-03-10 | Discharge: 2018-03-10 | Disposition: A | Discharge status: Home / Self Care | Payer: Medicare PPO | Source: Ambulatory Visit | Attending: Nurse Practitioner | Admitting: Nurse Practitioner

## 2018-03-10 ENCOUNTER — Encounter: Payer: Self-pay | Admitting: Nurse Practitioner

## 2018-03-10 DIAGNOSIS — R197 Diarrhea, unspecified: Secondary | ICD-10-CM | POA: Insufficient documentation

## 2018-03-10 DIAGNOSIS — R935 Abnormal findings on diagnostic imaging of other abdominal regions, including retroperitoneum: Secondary | ICD-10-CM | POA: Insufficient documentation

## 2018-03-10 DIAGNOSIS — R1084 Generalized abdominal pain: Secondary | ICD-10-CM

## 2018-03-10 DIAGNOSIS — K921 Melena: Secondary | ICD-10-CM | POA: Diagnosis not present

## 2018-03-10 DIAGNOSIS — R109 Unspecified abdominal pain: Secondary | ICD-10-CM | POA: Insufficient documentation

## 2018-03-10 LAB — COMPREHENSIVE METABOLIC PANEL
ALT: 17 U/L (ref 0–44)
AST: 21 U/L (ref 15–41)
Albumin: 3.8 g/dL (ref 3.5–5.0)
Alkaline Phosphatase: 80 U/L (ref 38–126)
Anion gap: 7 (ref 5–15)
BILIRUBIN TOTAL: 0.3 mg/dL (ref 0.3–1.2)
BUN: 36 mg/dL — ABNORMAL HIGH (ref 8–23)
CO2: 24 mmol/L (ref 22–32)
CREATININE: 0.98 mg/dL (ref 0.44–1.00)
Calcium: 10.2 mg/dL (ref 8.9–10.3)
Chloride: 108 mmol/L (ref 98–111)
GFR, EST NON AFRICAN AMERICAN: 59 mL/min — AB (ref >60)
Glucose, Bld: 111 mg/dL — ABNORMAL HIGH (ref 70–99)
POTASSIUM: 4.9 mmol/L (ref 3.5–5.1)
Sodium: 139 mmol/L (ref 135–145)
TOTAL PROTEIN: 7.1 g/dL (ref 6.5–8.1)

## 2018-03-10 LAB — CBC WITH DIFFERENTIAL/PLATELET
Basophils Absolute: 0 10*3/uL (ref 0.0–0.1)
Basophils Relative: 0 %
EOS PCT: 5 %
Eosinophils Absolute: 0.5 10*3/uL (ref 0.0–0.7)
HEMATOCRIT: 35.8 % — AB (ref 36.0–46.0)
Hemoglobin: 11.2 g/dL — ABNORMAL LOW (ref 12.0–15.0)
LYMPHS PCT: 21 %
Lymphs Abs: 2 10*3/uL (ref 0.7–4.0)
MCH: 30.9 pg (ref 26.0–34.0)
MCHC: 31.3 g/dL (ref 30.0–36.0)
MCV: 98.9 fL (ref 78.0–100.0)
MONO ABS: 0.8 10*3/uL (ref 0.1–1.0)
Monocytes Relative: 8 %
Neutro Abs: 6.3 10*3/uL (ref 1.7–7.7)
Neutrophils Relative %: 66 %
Platelets: 280 10*3/uL (ref 150–400)
RBC: 3.62 MIL/uL — ABNORMAL LOW (ref 3.87–5.11)
RDW: 15 % (ref 11.5–15.5)
WBC: 9.6 10*3/uL (ref 4.0–10.5)

## 2018-03-10 NOTE — H&P (View-Only) (Signed)
Primary Care Physician:  Dettinger, Fransisca Kaufmann, MD Primary Gastroenterologist:  Dr. Gala Romney  Chief Complaint  Patient presents with  . Abdominal Pain    states "mass in stomach"    HPI:   Tasha Clarke is a 67 y.o. female who presents in referral from hospitalist service for evaluation of possible gastric mass.  The patient was admitted to the hospital 01/27/2018 through 01/31/2018 for sepsis with septic shock.  History of chronic kidney disease stage III and COPD on home O2 at 2 L/min as well as diabetes and hypothyroidism.  She was admitted for sepsis with septic shock that resolved with treatment and pressors and no clear source of infection but query aspiration pneumonia with recent intubation.  Of note during her hospitalization a possible gastric mass was seen on CT.  CT the abdomen and pelvis completed 01/17/2018 which found gastric wall thickening along the posterior aspect of the fundus and cardia with potential polypoid extension into the gastric lumen and underlying gastric mass cannot be excluded.  Also may be a component of small diverticulum versus area of ulceration along the posterior wall and proximal stomach with recommended correlation through endoscopy.  Today she states she's not going great. Abdominal pain mid to lower abdomen, described as intermittent and sharp. Having diarrhea 10-20 stools a day sometimes watery sometimes with bit in it. Has chronic diarrhea which comes and goes and this sequence/episode started about 5 months ago. Denies hematochezia. Has "pitch black, tarry stools" but then states she can't tell exactly; is on iron. Frequent nausea, vomiting when she "gets diarrhea real bad" though not every day and about 3 times a week; states she had hematemesis 3 weeks ago. History of PUD, but this is "nothing like that." Denies chest pain, dyspnea, dizziness, lightheadedness, Denies any other upper or lower GI symptoms.  No significant traveling, no antibiotics other  than during and after hospital admission. Has well water at home.  Past Medical History:  Diagnosis Date  . Allergy   . Anemia   . Anxiety   . Arthritis   . Asthma   . Blood transfusion without reported diagnosis   . Cataract   . CHF (congestive heart failure) ([place])   . Chronic kidney disease   . Clotting disorder ([place])   . COPD (chronic obstructive pulmonary disease) ([place])   . Depression   . Diabetes mellitus without complication ([place])   . Emphysema of lung ([place])   . GERD (gastroesophageal reflux disease)   . Hyperlipidemia   . Hypertension   . Neuromuscular disorder ([place])   . Osteoporosis   . Oxygen deficiency   . Seizures ([place])   . Skin cancer    2 areas removed more than 10 years ago  . Sleep apnea 1985   wears cpap nightly  . Thyroid disease     Past Surgical History:  Procedure Laterality Date  . APPENDECTOMY    . EYE SURGERY    . FRACTURE SURGERY    . heart stents    . JOINT REPLACEMENT    . SPINAL CORD STIMULATOR IMPLANT  2015  . SPINE SURGERY      Current Outpatient Medications  Medication Sig Dispense Refill  . albuterol (PROVENTIL HFA) 108 (90 Base) MCG/ACT inhaler Inhale into the lungs.    Marland Kitchen alendronate (FOSAMAX) 70 MG tablet Take 1 tablet (70 mg total) by mouth every Monday. Take with a full glass of water on an empty stomach. 13 tablet 3  . aspirin  EC 81 MG tablet Take 81 mg by mouth daily.    Marland Kitchen atorvastatin (LIPITOR) 40 MG tablet Take 40 mg by mouth daily.    . cetirizine (ZYRTEC) 10 MG tablet Take 1 tablet (10 mg total) by mouth daily. 30 tablet 3  . Cholecalciferol (VITAMIN D-3) 5000 units TABS Take 5,000 Units by mouth daily.    . Cyanocobalamin (VITAMIN B-12 PO) Take by mouth daily.    . cyclobenzaprine (FLEXERIL) 10 MG tablet Take 1 tablet (10 mg total) by mouth 2 (two) times daily as needed for muscle spasms. 30 tablet 1  . diphenhydramine-acetaminophen (TYLENOL PM EXTRA STRENGTH) 25-500 MG TABS tablet Take 1 tablet by mouth at bedtime as  needed (pain).     . DULoxetine (CYMBALTA) 30 MG capsule Take 30 mg by mouth daily.    Marland Kitchen EPINEPHrine 0.3 mg/0.3 mL IJ SOAJ injection Inject 0.3 mg into the muscle as needed.    . Ferrous Sulfate (IRON) 325 (65 Fe) MG TABS Take 1 tablet (325 mg total) by mouth daily. 90 each 0  . fluticasone (FLONASE) 50 MCG/ACT nasal spray Place 2 sprays into both nostrils daily. 16 g 2  . fluticasone furoate-vilanterol (BREO ELLIPTA) 200-25 MCG/INH AEPB Inhale 1 puff into the lungs daily. 28 each 2  . Fluticasone-Salmeterol (ADVAIR) 250-50 MCG/DOSE AEPB Inhale 1 puff into the lungs 2 (two) times daily.    . folic acid (FOLVITE) 1 MG tablet Take 1 mg by mouth daily.    Marland Kitchen gabapentin (NEURONTIN) 600 MG tablet Take 1 tablet (600 mg total) by mouth 4 (four) times daily. 120 tablet 2  . HYDROcodone-acetaminophen (NORCO) 10-325 MG tablet Take 1 tablet by mouth every 6 (six) hours.    Marland Kitchen ipratropium-albuterol (DUONEB) 0.5-2.5 (3) MG/3ML SOLN Take 3 mLs by nebulization every 6 (six) hours as needed (shortness of breathe).     . levETIRAcetam (KEPPRA) 1000 MG tablet TAKE 1 TABLET BY MOUTH AT BEDTIME 30 tablet 2  . levETIRAcetam (KEPPRA) 750 MG tablet Take 1 tablet (750 mg total) by mouth daily. Take 750 in a.m. and 1000 in p.m. 90 tablet 1  . levothyroxine (SYNTHROID, LEVOTHROID) 50 MCG tablet Take 50 mcg by mouth daily before breakfast.    . losartan (COZAAR) 100 MG tablet Take 0.5 tablets (50 mg total) by mouth daily. 45 tablet 3  . Magnesium 400 MG CAPS Take 400 mg by mouth daily.    . Magnesium Gluconate 550 MG TABS Take 1 tablet (550 mg total) by mouth daily. 30 each 5  . metoprolol tartrate (LOPRESSOR) 25 MG tablet Take 25 mg by mouth 2 (two) times daily.    . Multiple Vitamin (MULTIVITAMIN) tablet Take 1 tablet by mouth daily.    . naloxone (NARCAN) nasal spray 4 mg/0.1 mL Place into the nose.    . Omega-3 Fatty Acids (FISH OIL) 1000 MG CAPS Take 1,000 mg by mouth daily.    Marland Kitchen lacosamide (VIMPAT) 50 MG TABS tablet  Take 2 tablets (100 mg total) by mouth 2 (two) times daily for 1 day, THEN 1 tablet (50 mg total) 2 (two) times daily for 1 day. 6 tablet 0   No current facility-administered medications for this visit.     Allergies as of 03/10/2018 - Review Complete 03/10/2018  Allergen Reaction Noted  . Cephalexin Hives 01/24/2013  . Ketorolac tromethamine Hives 07/26/2017  . Lac bovis Nausea And Vomiting 05/28/2015  . Imdur  [isosorbide dinitrate]    . Iodinated diagnostic agents  05/28/2015  .  Other Other (See Comments) 01/24/2013  . Sodium bicarbonate-citric acid  08/24/2017  . Milk-related compounds Nausea And Vomiting 05/28/2015    Family History  Problem Relation Age of Onset  . Arthritis Mother   . Asthma Mother   . Cancer Mother        lung  . Depression Mother   . Diabetes Mother   . Arthritis Father   . Asthma Father   . Depression Father   . Asthma Sister   . Depression Sister   . Diabetes Sister   . Arthritis Maternal Grandmother   . Asthma Maternal Grandmother   . Depression Maternal Grandmother   . Arthritis Maternal Grandfather   . Asthma Maternal Grandfather   . Arthritis Paternal Grandmother   . Asthma Paternal Grandmother   . Arthritis Paternal Grandfather   . Asthma Paternal Grandfather   . COPD Paternal Grandfather   . Diabetes Sister   . Mental illness Sister   . Anemia Sister   . Hypertension Son   . Post-traumatic stress disorder Son   . Allergies Daughter     Social History   Socioeconomic History  . Marital status: Married    Spouse name: Not on file  . Number of children: Not on file  . Years of education: Not on file  . Highest education level: Not on file  Occupational History  . Not on file  Social Needs  . Financial resource strain: Not on file  . Food insecurity:    Worry: Not on file    Inability: Not on file  . Transportation needs:    Medical: Not on file    Non-medical: Not on file  Tobacco Use  . Smoking status: Current Every  Day Smoker    Packs/day: 0.75    Years: 43.00    Pack years: 32.25    Types: Cigarettes  . Smokeless tobacco: Never Used  Substance and Sexual Activity  . Alcohol use: No    Frequency: Never  . Drug use: No  . Sexual activity: Not on file  Lifestyle  . Physical activity:    Days per week: Not on file    Minutes per session: Not on file  . Stress: Not on file  Relationships  . Social connections:    Talks on phone: Not on file    Gets together: Not on file    Attends religious service: Not on file    Active member of club or organization: Not on file    Attends meetings of clubs or organizations: Not on file    Relationship status: Not on file  . Intimate partner violence:    Fear of current or ex partner: Not on file    Emotionally abused: Not on file    Physically abused: Not on file    Forced sexual activity: Not on file  Other Topics Concern  . Not on file  Social History Narrative  . Not on file    Review of Systems: General: Negative for anorexia, weight loss, fever, chills, fatigue, weakness. ENT: Negative for hoarseness, difficulty swallowing , nasal congestion. CV: Negative for chest pain, angina, palpitations, dyspnea on exertion, peripheral edema.  Respiratory: Negative for dyspnea at rest, dyspnea on exertion, cough, sputum, wheezing.  GI: See history of present illness. MS: Negative for joint pain, low back pain.  Derm: Negative for rash or itching.  Endo: Negative for unusual weight change.  Heme: Negative for bruising or bleeding. Allergy: Negative for rash or hives.  Physical Exam: BP (!) 144/75   Pulse 80   Temp 97.7 F (36.5 C) (Oral)   Ht 5\' 5"  (1.651 m)   Wt 193 lb 3.2 oz (87.6 kg)   BMI 32.15 kg/m  General:   Alert and oriented. Pleasant and cooperative. Well-nourished and well-developed.  Head:  Normocephalic and atraumatic. Eyes:  Without icterus, sclera clear and conjunctiva pink.  Ears:  Normal auditory acuity. Cardiovascular:   S1, S2 present without murmurs appreciated. Normal bilateral DP pulses noted. Extremities without clubbing or edema. Respiratory:  Clear to auscultation bilaterally. No wheezes, rales, or rhonchi. No distress.  Gastrointestinal:  +BS, soft, non-tender and non-distended. No HSM noted. No guarding or rebound. No masses appreciated.  Rectal:  Deferred  Musculoskalatal:  Symmetrical without gross deformities. Neurologic:  Alert and oriented x4;  grossly normal neurologically. Psych:  Alert and cooperative. Normal mood and affect. Heme/Lymph/Immune: No excessive bruising noted.    03/10/2018 11:33 AM   Disclaimer: This note was dictated with voice recognition software. Similar sounding words can inadvertently be transcribed and may not be corrected upon review.

## 2018-03-10 NOTE — Patient Instructions (Signed)
1. Have your stool studies done when you are able to. 2. Have your labs drawn when you are able to. 3. We will schedule your upper endoscopy for you. 4. We will request your previous colonoscopy from [facility]. 5. Return for follow-up in 2 months. 6. Further recommendations will be made after your procedure and your results return. 7. Call us if you have any questions or concerns.  At [facility] we value your feedback. You may receive a survey about your visit today. Please share your experience as we strive to create trusting relationships with our patients to provide genuine, compassionate, quality care.  We appreciate your understanding and patience as we review any laboratory studies, imaging, and other diagnostic tests that are ordered as we care for you. Our office policy is 5 business days for review of these results, and any emergent or urgent results are addressed in a timely manner for your best interest. If you do not hear from our office in 1 week, please contact us.   We also encourage the use of MyChart, which contains your medical information for your review as well. If you are not enrolled in this feature, an access code is on this after visit summary for your convenience. Thank you for allowing [place] to be involved in your care.  It was great to meet you today!  I hope you have a great summer!!

## 2018-03-10 NOTE — Assessment & Plan Note (Signed)
The patient was recently admitted to the hospital where she had a CT of the abdomen and pelvis.  Her CT found gastric wall thickening along the posterior aspect of the fundus and cardia with potential polypoid extension into the gastric lumen and underlying gastric mass cannot be excluded.  They recommended endoscopic evaluation.  I certainly agree this is beneficial.  We will plan for an upper endoscopy for further evaluation.  Proceed with EGD with Dr. Gala Romney in near future: the risks, benefits, and alternatives have been discussed with the patient in detail. The patient states understanding and desires to proceed.  The patient is currently on Flexeril, Cymbalta, Neurontin, hydrocodone.  No other anticoagulants, anxiolytics, chronic pain medications, or antidepressants.  We will plan for the procedure on propofol/MAC to promote adequate sedation.

## 2018-03-10 NOTE — Assessment & Plan Note (Signed)
The patient presents with multiple complaints including chronic diarrhea with an acute exacerbation recently.  States she is currently having 10-20 stools a day sometimes loose sometimes watery.  This most recent sequence of exacerbation started about 5 months ago.  I doubt a significant colonic infection given the length of time she has had her symptoms.  However, I will check CBC, CMP, stool studies.  She states she had a colonoscopy more than 5 years ago but less than 10 years ago at [facility] in [facility].  We will request her previous report.  We will have her follow-up in 2 months and may need to schedule a colonoscopy for further evaluation depending on results and response.

## 2018-03-10 NOTE — Assessment & Plan Note (Signed)
The patient notes she has had black tarry stools, however she is on iron and she states she "cannot tell exactly."  She also has some nausea and vomiting.  CT of her abdomen was abnormal and worrisome for cancerous process versus ulceration, as per below.  We are planning an EGD based on her CT findings and this should help determine if her black stools are because of upper GI bleed.  Return for follow-up in 2 months.  Further recommendations to follow.

## 2018-03-10 NOTE — Assessment & Plan Note (Signed)
The patient describes abdominal pain in her mid to lower abdomen which is intermittent and sharp.  This is associated with frequent diarrhea which is an exacerbation of her chronic symptoms that started about 5 months ago.  She also has some nausea and vomiting and describes "pitch black tarry stools".  She does have an abnormal CT of the abdomen warranting upper endoscopic evaluation.  This should help shed light on her abdominal pain symptoms as well.  We will proceed with endoscopy as per above.  We will request previous endoscopic reports.  I am checking labs and stool studies and will have her follow-up in 2 months.  Further recommendations pending results.

## 2018-03-10 NOTE — Progress Notes (Signed)
Primary Care Physician:  Dettinger, Fransisca Kaufmann, MD Primary Gastroenterologist:  Dr. Gala Romney  Chief Complaint  Patient presents with  . Abdominal Pain    states "mass in stomach"    HPI:   Tasha Clarke is a 67 y.o. female who presents in referral from hospitalist service for evaluation of possible gastric mass.  The patient was admitted to the hospital 01/27/2018 through 01/31/2018 for sepsis with septic shock.  History of chronic kidney disease stage III and COPD on home O2 at 2 L/min as well as diabetes and hypothyroidism.  She was admitted for sepsis with septic shock that resolved with treatment and pressors and no clear source of infection but query aspiration pneumonia with recent intubation.  Of note during her hospitalization a possible gastric mass was seen on CT.  CT the abdomen and pelvis completed 01/17/2018 which found gastric wall thickening along the posterior aspect of the fundus and cardia with potential polypoid extension into the gastric lumen and underlying gastric mass cannot be excluded.  Also may be a component of small diverticulum versus area of ulceration along the posterior wall and proximal stomach with recommended correlation through endoscopy.  Today she states she's not going great. Abdominal pain mid to lower abdomen, described as intermittent and sharp. Having diarrhea 10-20 stools a day sometimes watery sometimes with bit in it. Has chronic diarrhea which comes and goes and this sequence/episode started about 5 months ago. Denies hematochezia. Has "pitch black, tarry stools" but then states she can't tell exactly; is on iron. Frequent nausea, vomiting when she "gets diarrhea real bad" though not every day and about 3 times a week; states she had hematemesis 3 weeks ago. History of PUD, but this is "nothing like that." Denies chest pain, dyspnea, dizziness, lightheadedness, Denies any other upper or lower GI symptoms.  No significant traveling, no antibiotics other  than during and after hospital admission. Has well water at home.  Past Medical History:  Diagnosis Date  . Allergy   . Anemia   . Anxiety   . Arthritis   . Asthma   . Blood transfusion without reported diagnosis   . Cataract   . CHF (congestive heart failure) ([place])   . Chronic kidney disease   . Clotting disorder ([place])   . COPD (chronic obstructive pulmonary disease) ([place])   . Depression   . Diabetes mellitus without complication ([place])   . Emphysema of lung ([place])   . GERD (gastroesophageal reflux disease)   . Hyperlipidemia   . Hypertension   . Neuromuscular disorder ([place])   . Osteoporosis   . Oxygen deficiency   . Seizures ([place])   . Skin cancer    2 areas removed more than 10 years ago  . Sleep apnea 1985   wears cpap nightly  . Thyroid disease     Past Surgical History:  Procedure Laterality Date  . APPENDECTOMY    . EYE SURGERY    . FRACTURE SURGERY    . heart stents    . JOINT REPLACEMENT    . SPINAL CORD STIMULATOR IMPLANT  2015  . SPINE SURGERY      Current Outpatient Medications  Medication Sig Dispense Refill  . albuterol (PROVENTIL HFA) 108 (90 Base) MCG/ACT inhaler Inhale into the lungs.    Marland Kitchen alendronate (FOSAMAX) 70 MG tablet Take 1 tablet (70 mg total) by mouth every Monday. Take with a full glass of water on an empty stomach. 13 tablet 3  . aspirin  EC 81 MG tablet Take 81 mg by mouth daily.    Marland Kitchen atorvastatin (LIPITOR) 40 MG tablet Take 40 mg by mouth daily.    . cetirizine (ZYRTEC) 10 MG tablet Take 1 tablet (10 mg total) by mouth daily. 30 tablet 3  . Cholecalciferol (VITAMIN D-3) 5000 units TABS Take 5,000 Units by mouth daily.    . Cyanocobalamin (VITAMIN B-12 PO) Take by mouth daily.    . cyclobenzaprine (FLEXERIL) 10 MG tablet Take 1 tablet (10 mg total) by mouth 2 (two) times daily as needed for muscle spasms. 30 tablet 1  . diphenhydramine-acetaminophen (TYLENOL PM EXTRA STRENGTH) 25-500 MG TABS tablet Take 1 tablet by mouth at bedtime as  needed (pain).     . DULoxetine (CYMBALTA) 30 MG capsule Take 30 mg by mouth daily.    Marland Kitchen EPINEPHrine 0.3 mg/0.3 mL IJ SOAJ injection Inject 0.3 mg into the muscle as needed.    . Ferrous Sulfate (IRON) 325 (65 Fe) MG TABS Take 1 tablet (325 mg total) by mouth daily. 90 each 0  . fluticasone (FLONASE) 50 MCG/ACT nasal spray Place 2 sprays into both nostrils daily. 16 g 2  . fluticasone furoate-vilanterol (BREO ELLIPTA) 200-25 MCG/INH AEPB Inhale 1 puff into the lungs daily. 28 each 2  . Fluticasone-Salmeterol (ADVAIR) 250-50 MCG/DOSE AEPB Inhale 1 puff into the lungs 2 (two) times daily.    . folic acid (FOLVITE) 1 MG tablet Take 1 mg by mouth daily.    Marland Kitchen gabapentin (NEURONTIN) 600 MG tablet Take 1 tablet (600 mg total) by mouth 4 (four) times daily. 120 tablet 2  . HYDROcodone-acetaminophen (NORCO) 10-325 MG tablet Take 1 tablet by mouth every 6 (six) hours.    Marland Kitchen ipratropium-albuterol (DUONEB) 0.5-2.5 (3) MG/3ML SOLN Take 3 mLs by nebulization every 6 (six) hours as needed (shortness of breathe).     . levETIRAcetam (KEPPRA) 1000 MG tablet TAKE 1 TABLET BY MOUTH AT BEDTIME 30 tablet 2  . levETIRAcetam (KEPPRA) 750 MG tablet Take 1 tablet (750 mg total) by mouth daily. Take 750 in a.m. and 1000 in p.m. 90 tablet 1  . levothyroxine (SYNTHROID, LEVOTHROID) 50 MCG tablet Take 50 mcg by mouth daily before breakfast.    . losartan (COZAAR) 100 MG tablet Take 0.5 tablets (50 mg total) by mouth daily. 45 tablet 3  . Magnesium 400 MG CAPS Take 400 mg by mouth daily.    . Magnesium Gluconate 550 MG TABS Take 1 tablet (550 mg total) by mouth daily. 30 each 5  . metoprolol tartrate (LOPRESSOR) 25 MG tablet Take 25 mg by mouth 2 (two) times daily.    . Multiple Vitamin (MULTIVITAMIN) tablet Take 1 tablet by mouth daily.    . naloxone (NARCAN) nasal spray 4 mg/0.1 mL Place into the nose.    . Omega-3 Fatty Acids (FISH OIL) 1000 MG CAPS Take 1,000 mg by mouth daily.    Marland Kitchen lacosamide (VIMPAT) 50 MG TABS tablet  Take 2 tablets (100 mg total) by mouth 2 (two) times daily for 1 day, THEN 1 tablet (50 mg total) 2 (two) times daily for 1 day. 6 tablet 0   No current facility-administered medications for this visit.     Allergies as of 03/10/2018 - Review Complete 03/10/2018  Allergen Reaction Noted  . Cephalexin Hives 01/24/2013  . Ketorolac tromethamine Hives 07/26/2017  . Lac bovis Nausea And Vomiting 05/28/2015  . Imdur  [isosorbide dinitrate]    . Iodinated diagnostic agents  05/28/2015  .  Other Other (See Comments) 01/24/2013  . Sodium bicarbonate-citric acid  08/24/2017  . Milk-related compounds Nausea And Vomiting 05/28/2015    Family History  Problem Relation Age of Onset  . Arthritis Mother   . Asthma Mother   . Cancer Mother        lung  . Depression Mother   . Diabetes Mother   . Arthritis Father   . Asthma Father   . Depression Father   . Asthma Sister   . Depression Sister   . Diabetes Sister   . Arthritis Maternal Grandmother   . Asthma Maternal Grandmother   . Depression Maternal Grandmother   . Arthritis Maternal Grandfather   . Asthma Maternal Grandfather   . Arthritis Paternal Grandmother   . Asthma Paternal Grandmother   . Arthritis Paternal Grandfather   . Asthma Paternal Grandfather   . COPD Paternal Grandfather   . Diabetes Sister   . Mental illness Sister   . Anemia Sister   . Hypertension Son   . Post-traumatic stress disorder Son   . Allergies Daughter     Social History   Socioeconomic History  . Marital status: Married    Spouse name: Not on file  . Number of children: Not on file  . Years of education: Not on file  . Highest education level: Not on file  Occupational History  . Not on file  Social Needs  . Financial resource strain: Not on file  . Food insecurity:    Worry: Not on file    Inability: Not on file  . Transportation needs:    Medical: Not on file    Non-medical: Not on file  Tobacco Use  . Smoking status: Current Every  Day Smoker    Packs/day: 0.75    Years: 43.00    Pack years: 32.25    Types: Cigarettes  . Smokeless tobacco: Never Used  Substance and Sexual Activity  . Alcohol use: No    Frequency: Never  . Drug use: No  . Sexual activity: Not on file  Lifestyle  . Physical activity:    Days per week: Not on file    Minutes per session: Not on file  . Stress: Not on file  Relationships  . Social connections:    Talks on phone: Not on file    Gets together: Not on file    Attends religious service: Not on file    Active member of club or organization: Not on file    Attends meetings of clubs or organizations: Not on file    Relationship status: Not on file  . Intimate partner violence:    Fear of current or ex partner: Not on file    Emotionally abused: Not on file    Physically abused: Not on file    Forced sexual activity: Not on file  Other Topics Concern  . Not on file  Social History Narrative  . Not on file    Review of Systems: General: Negative for anorexia, weight loss, fever, chills, fatigue, weakness. ENT: Negative for hoarseness, difficulty swallowing , nasal congestion. CV: Negative for chest pain, angina, palpitations, dyspnea on exertion, peripheral edema.  Respiratory: Negative for dyspnea at rest, dyspnea on exertion, cough, sputum, wheezing.  GI: See history of present illness. MS: Negative for joint pain, low back pain.  Derm: Negative for rash or itching.  Endo: Negative for unusual weight change.  Heme: Negative for bruising or bleeding. Allergy: Negative for rash or hives.  Physical Exam: BP (!) 144/75   Pulse 80   Temp 97.7 F (36.5 C) (Oral)   Ht 5\' 5"  (1.651 m)   Wt 193 lb 3.2 oz (87.6 kg)   BMI 32.15 kg/m  General:   Alert and oriented. Pleasant and cooperative. Well-nourished and well-developed.  Head:  Normocephalic and atraumatic. Eyes:  Without icterus, sclera clear and conjunctiva pink.  Ears:  Normal auditory acuity. Cardiovascular:   S1, S2 present without murmurs appreciated. Normal bilateral DP pulses noted. Extremities without clubbing or edema. Respiratory:  Clear to auscultation bilaterally. No wheezes, rales, or rhonchi. No distress.  Gastrointestinal:  +BS, soft, non-tender and non-distended. No HSM noted. No guarding or rebound. No masses appreciated.  Rectal:  Deferred  Musculoskalatal:  Symmetrical without gross deformities. Neurologic:  Alert and oriented x4;  grossly normal neurologically. Psych:  Alert and cooperative. Normal mood and affect. Heme/Lymph/Immune: No excessive bruising noted.    03/10/2018 11:33 AM   Disclaimer: This note was dictated with voice recognition software. Similar sounding words can inadvertently be transcribed and may not be corrected upon review.

## 2018-03-13 ENCOUNTER — Other Ambulatory Visit (HOSPITAL_COMMUNITY)
Admission: AD | Admit: 2018-03-13 | Discharge: 2018-03-13 | Disposition: A | Discharge status: Home / Self Care | Payer: Medicare PPO | Source: Other Acute Inpatient Hospital | Attending: Nurse Practitioner | Admitting: Nurse Practitioner

## 2018-03-13 DIAGNOSIS — R197 Diarrhea, unspecified: Secondary | ICD-10-CM | POA: Insufficient documentation

## 2018-03-13 DIAGNOSIS — R935 Abnormal findings on diagnostic imaging of other abdominal regions, including retroperitoneum: Secondary | ICD-10-CM | POA: Diagnosis not present

## 2018-03-13 DIAGNOSIS — R1084 Generalized abdominal pain: Secondary | ICD-10-CM | POA: Diagnosis not present

## 2018-03-13 DIAGNOSIS — K921 Melena: Secondary | ICD-10-CM | POA: Diagnosis not present

## 2018-03-14 ENCOUNTER — Encounter (HOSPITAL_COMMUNITY)
Admission: RE | Admit: 2018-03-14 | Discharge: 2018-03-14 | Disposition: A | Discharge status: Home / Self Care | Payer: Medicare PPO | Source: Ambulatory Visit | Attending: Internal Medicine | Admitting: Internal Medicine

## 2018-03-14 ENCOUNTER — Encounter: Payer: Self-pay | Admitting: Internal Medicine

## 2018-03-14 ENCOUNTER — Telehealth: Payer: Self-pay

## 2018-03-14 LAB — GASTROINTESTINAL PANEL BY PCR, STOOL (REPLACES STOOL CULTURE)
ADENOVIRUS F40/41: NOT DETECTED
ASTROVIRUS: NOT DETECTED
CRYPTOSPORIDIUM: NOT DETECTED
Campylobacter species: NOT DETECTED
Cyclospora cayetanensis: NOT DETECTED
ENTEROPATHOGENIC E COLI (EPEC): NOT DETECTED
ENTEROTOXIGENIC E COLI (ETEC): NOT DETECTED
Entamoeba histolytica: NOT DETECTED
Enteroaggregative E coli (EAEC): NOT DETECTED
GIARDIA LAMBLIA: NOT DETECTED
Norovirus GI/GII: NOT DETECTED
Plesimonas shigelloides: NOT DETECTED
Rotavirus A: NOT DETECTED
SAPOVIRUS (I, II, IV, AND V): NOT DETECTED
Salmonella species: NOT DETECTED
Shiga like toxin producing E coli (STEC): NOT DETECTED
Shigella/Enteroinvasive E coli (EIEC): NOT DETECTED
VIBRIO CHOLERAE: NOT DETECTED
VIBRIO SPECIES: NOT DETECTED
YERSINIA ENTEROCOLITICA: NOT DETECTED

## 2018-03-14 NOTE — Telephone Encounter (Signed)
Received a VM from [facility] stating that the stool testing for pt wasn't able to be done due to the stool being formed that was turned in by the pt.

## 2018-03-14 NOTE — Progress Notes (Signed)
cc'd to pcp 

## 2018-03-15 NOTE — Telephone Encounter (Signed)
Noted. I think this was in my result note to her.

## 2018-03-16 ENCOUNTER — Ambulatory Visit (HOSPITAL_COMMUNITY): Payer: Medicare PPO | Admitting: Anesthesiology

## 2018-03-16 ENCOUNTER — Encounter (HOSPITAL_COMMUNITY): Payer: Self-pay | Admitting: *Deleted

## 2018-03-16 ENCOUNTER — Ambulatory Visit: Payer: Medicare PPO | Admitting: Family Medicine

## 2018-03-16 ENCOUNTER — Encounter: Payer: Self-pay | Admitting: Family Medicine

## 2018-03-16 ENCOUNTER — Ambulatory Visit (HOSPITAL_COMMUNITY)
Admission: RE | Admit: 2018-03-16 | Discharge: 2018-03-16 | Disposition: A | Discharge status: Home / Self Care | Payer: Medicare PPO | Source: Ambulatory Visit | Attending: Internal Medicine | Admitting: Internal Medicine

## 2018-03-16 ENCOUNTER — Encounter (HOSPITAL_COMMUNITY)
Admission: RE | Disposition: A | Discharge status: Home / Self Care | Payer: Self-pay | Source: Ambulatory Visit | Attending: Internal Medicine

## 2018-03-16 VITALS — BP 91/61 | HR 86 | Temp 97.8°F

## 2018-03-16 DIAGNOSIS — Z888 Allergy status to other drugs, medicaments and biological substances status: Secondary | ICD-10-CM | POA: Diagnosis not present

## 2018-03-16 DIAGNOSIS — E1122 Type 2 diabetes mellitus with diabetic chronic kidney disease: Secondary | ICD-10-CM | POA: Insufficient documentation

## 2018-03-16 DIAGNOSIS — Z85828 Personal history of other malignant neoplasm of skin: Secondary | ICD-10-CM | POA: Diagnosis not present

## 2018-03-16 DIAGNOSIS — F329 Major depressive disorder, single episode, unspecified: Secondary | ICD-10-CM | POA: Insufficient documentation

## 2018-03-16 DIAGNOSIS — Z79899 Other long term (current) drug therapy: Secondary | ICD-10-CM | POA: Insufficient documentation

## 2018-03-16 DIAGNOSIS — F419 Anxiety disorder, unspecified: Secondary | ICD-10-CM | POA: Insufficient documentation

## 2018-03-16 DIAGNOSIS — D689 Coagulation defect, unspecified: Secondary | ICD-10-CM | POA: Insufficient documentation

## 2018-03-16 DIAGNOSIS — Z818 Family history of other mental and behavioral disorders: Secondary | ICD-10-CM | POA: Insufficient documentation

## 2018-03-16 DIAGNOSIS — Z9981 Dependence on supplemental oxygen: Secondary | ICD-10-CM | POA: Diagnosis not present

## 2018-03-16 DIAGNOSIS — F17210 Nicotine dependence, cigarettes, uncomplicated: Secondary | ICD-10-CM | POA: Insufficient documentation

## 2018-03-16 DIAGNOSIS — K921 Melena: Secondary | ICD-10-CM

## 2018-03-16 DIAGNOSIS — M810 Age-related osteoporosis without current pathological fracture: Secondary | ICD-10-CM | POA: Diagnosis not present

## 2018-03-16 DIAGNOSIS — R1084 Generalized abdominal pain: Secondary | ICD-10-CM

## 2018-03-16 DIAGNOSIS — Z825 Family history of asthma and other chronic lower respiratory diseases: Secondary | ICD-10-CM | POA: Insufficient documentation

## 2018-03-16 DIAGNOSIS — K219 Gastro-esophageal reflux disease without esophagitis: Secondary | ICD-10-CM | POA: Insufficient documentation

## 2018-03-16 DIAGNOSIS — E039 Hypothyroidism, unspecified: Secondary | ICD-10-CM | POA: Diagnosis not present

## 2018-03-16 DIAGNOSIS — R933 Abnormal findings on diagnostic imaging of other parts of digestive tract: Secondary | ICD-10-CM | POA: Diagnosis not present

## 2018-03-16 DIAGNOSIS — F339 Major depressive disorder, recurrent, unspecified: Secondary | ICD-10-CM | POA: Diagnosis not present

## 2018-03-16 DIAGNOSIS — Z8489 Family history of other specified conditions: Secondary | ICD-10-CM | POA: Insufficient documentation

## 2018-03-16 DIAGNOSIS — E785 Hyperlipidemia, unspecified: Secondary | ICD-10-CM | POA: Insufficient documentation

## 2018-03-16 DIAGNOSIS — D631 Anemia in chronic kidney disease: Secondary | ICD-10-CM | POA: Insufficient documentation

## 2018-03-16 DIAGNOSIS — Z8711 Personal history of peptic ulcer disease: Secondary | ICD-10-CM | POA: Insufficient documentation

## 2018-03-16 DIAGNOSIS — Z7951 Long term (current) use of inhaled steroids: Secondary | ICD-10-CM | POA: Diagnosis not present

## 2018-03-16 DIAGNOSIS — J45909 Unspecified asthma, uncomplicated: Secondary | ICD-10-CM | POA: Insufficient documentation

## 2018-03-16 DIAGNOSIS — Z91011 Allergy to milk products: Secondary | ICD-10-CM | POA: Insufficient documentation

## 2018-03-16 DIAGNOSIS — G4730 Sleep apnea, unspecified: Secondary | ICD-10-CM | POA: Diagnosis not present

## 2018-03-16 DIAGNOSIS — I10 Essential (primary) hypertension: Secondary | ICD-10-CM | POA: Diagnosis not present

## 2018-03-16 DIAGNOSIS — Z881 Allergy status to other antibiotic agents status: Secondary | ICD-10-CM | POA: Insufficient documentation

## 2018-03-16 DIAGNOSIS — Z7982 Long term (current) use of aspirin: Secondary | ICD-10-CM | POA: Diagnosis not present

## 2018-03-16 DIAGNOSIS — Z91041 Radiographic dye allergy status: Secondary | ICD-10-CM | POA: Insufficient documentation

## 2018-03-16 DIAGNOSIS — R197 Diarrhea, unspecified: Secondary | ICD-10-CM

## 2018-03-16 DIAGNOSIS — J439 Emphysema, unspecified: Secondary | ICD-10-CM | POA: Diagnosis not present

## 2018-03-16 DIAGNOSIS — R935 Abnormal findings on diagnostic imaging of other abdominal regions, including retroperitoneum: Secondary | ICD-10-CM

## 2018-03-16 DIAGNOSIS — Z9660 Presence of unspecified orthopedic joint implant: Secondary | ICD-10-CM | POA: Diagnosis not present

## 2018-03-16 DIAGNOSIS — K3189 Other diseases of stomach and duodenum: Secondary | ICD-10-CM | POA: Diagnosis not present

## 2018-03-16 DIAGNOSIS — Z8261 Family history of arthritis: Secondary | ICD-10-CM | POA: Insufficient documentation

## 2018-03-16 DIAGNOSIS — Z833 Family history of diabetes mellitus: Secondary | ICD-10-CM | POA: Insufficient documentation

## 2018-03-16 DIAGNOSIS — K259 Gastric ulcer, unspecified as acute or chronic, without hemorrhage or perforation: Secondary | ICD-10-CM | POA: Diagnosis not present

## 2018-03-16 DIAGNOSIS — N183 Chronic kidney disease, stage 3 (moderate): Secondary | ICD-10-CM | POA: Diagnosis not present

## 2018-03-16 DIAGNOSIS — I130 Hypertensive heart and chronic kidney disease with heart failure and stage 1 through stage 4 chronic kidney disease, or unspecified chronic kidney disease: Secondary | ICD-10-CM | POA: Diagnosis not present

## 2018-03-16 DIAGNOSIS — I509 Heart failure, unspecified: Secondary | ICD-10-CM | POA: Insufficient documentation

## 2018-03-16 HISTORY — PX: ESOPHAGOGASTRODUODENOSCOPY (EGD) WITH PROPOFOL: SHX5813

## 2018-03-16 HISTORY — PX: BIOPSY: SHX5522

## 2018-03-16 SURGERY — ESOPHAGOGASTRODUODENOSCOPY (EGD) WITH PROPOFOL
Anesthesia: Monitor Anesthesia Care

## 2018-03-16 MED ORDER — PROPOFOL 500 MG/50ML IV EMUL
INTRAVENOUS | Status: DC | PRN
  Administered 2018-03-16: 150 ug/kg/min via INTRAVENOUS

## 2018-03-16 MED ORDER — LACTATED RINGERS IV SOLN
INTRAVENOUS | Status: DC
  Administered 2018-03-16: 1000 mL via INTRAVENOUS

## 2018-03-16 MED ORDER — LACTATED RINGERS IV SOLN: INTRAVENOUS | Status: DC

## 2018-03-16 MED ORDER — HYDROMORPHONE HCL 1 MG/ML IJ SOLN: 0.2500 mg | INTRAMUSCULAR | Status: DC | PRN

## 2018-03-16 MED ORDER — LIDOCAINE HCL (CARDIAC) PF 50 MG/5ML IV SOSY
PREFILLED_SYRINGE | INTRAVENOUS | Status: DC | PRN
  Administered 2018-03-16: 50 mg via INTRAVENOUS

## 2018-03-16 MED ORDER — MEPERIDINE HCL 100 MG/ML IJ SOLN: 6.2500 mg | INTRAMUSCULAR | Status: DC | PRN

## 2018-03-16 MED ORDER — PROPOFOL 10 MG/ML IV BOLUS
INTRAVENOUS | Status: DC | PRN
  Administered 2018-03-16 (×2): 30 mg via INTRAVENOUS

## 2018-03-16 MED ORDER — HYDROCODONE-ACETAMINOPHEN 7.5-325 MG PO TABS: 1.0000 | ORAL_TABLET | Freq: Once | ORAL | Status: DC | PRN

## 2018-03-16 MED ORDER — PROPOFOL 10 MG/ML IV BOLUS
INTRAVENOUS | Status: AC
  Filled 2018-03-16: qty 40

## 2018-03-16 MED ORDER — DULOXETINE HCL 60 MG PO CPEP: 60.0000 mg | ORAL_CAPSULE | Freq: Every day | ORAL | 2 refills | Status: DC

## 2018-03-16 MED ORDER — GLYCOPYRROLATE 0.2 MG/ML IJ SOLN
INTRAMUSCULAR | Status: AC
  Filled 2018-03-16: qty 1

## 2018-03-16 MED ORDER — CHLORHEXIDINE GLUCONATE CLOTH 2 % EX PADS: 6.0000 | MEDICATED_PAD | Freq: Once | CUTANEOUS | Status: DC

## 2018-03-16 MED ORDER — STERILE WATER FOR IRRIGATION IR SOLN
Status: DC | PRN
  Administered 2018-03-16: 100 mL

## 2018-03-16 MED ORDER — LIDOCAINE HCL (PF) 1 % IJ SOLN
INTRAMUSCULAR | Status: AC
  Filled 2018-03-16: qty 5

## 2018-03-16 MED ORDER — GLYCOPYRROLATE 0.2 MG/ML IJ SOLN
INTRAMUSCULAR | Status: DC | PRN
  Administered 2018-03-16: 0.2 mg via INTRAVENOUS

## 2018-03-16 MED ORDER — PROMETHAZINE HCL 25 MG/ML IJ SOLN: 6.2500 mg | INTRAMUSCULAR | Status: DC | PRN

## 2018-03-16 NOTE — Interval H&P Note (Signed)
History and Physical Interval Note:  03/16/2018 3:01 PM  Tasha Clarke  has presented today for surgery, with the diagnosis of abnormal CT, abdominal pain, black stools, diarrhea  The various methods of treatment have been discussed with the patient and family. After consideration of risks, benefits and other options for treatment, the patient has consented to  Procedure(s) with comments: ESOPHAGOGASTRODUODENOSCOPY (EGD) WITH PROPOFOL (N/A) - 3:00pm as a surgical intervention .  The patient's history has been reviewed, patient examined, no change in status, stable for surgery.  I have reviewed the patient's chart and labs.  Questions were answered to the patient's satisfaction.    No change EGD today to further evaluate abnormal stomach on CT. Patient denies dysphagia.  The risks, benefits, limitations, alternatives and imponderables have been reviewed with the patient. Potential for esophageal dilation, biopsy, etc. have also been reviewed.  Questions have been answered. All parties agreeable. [name]

## 2018-03-16 NOTE — Discharge Instructions (Signed)

## 2018-03-16 NOTE — Anesthesia Procedure Notes (Signed)
Procedure Name: MAC Date/Time: 03/16/2018 3:10 PM Performed by: Andree Elk [place] Kreitzer A, CRNA Pre-anesthesia Checklist: Patient identified, Suction available, Emergency Drugs available, Patient being monitored and Timeout performed Oxygen Delivery Method: Simple face mask

## 2018-03-16 NOTE — Transfer of Care (Signed)
Immediate Anesthesia Transfer of Care Note  Patient: Tasha Clarke  Procedure(s) Performed: ESOPHAGOGASTRODUODENOSCOPY (EGD) WITH PROPOFOL (N/A ) BIOPSY  Patient Location: PACU  Anesthesia Type:MAC  Level of Consciousness: drowsy and patient cooperative  Airway & Oxygen Therapy: Patient Spontanous Breathing and Patient connected to nasal cannula oxygen  Post-op Assessment: Report given to RN and Post -op Vital signs reviewed and stable  Post vital signs: Reviewed and stable  Last Vitals:  Vitals Value Taken Time  BP    Temp    Pulse    Resp 17 03/16/2018  3:25 PM  SpO2    Vitals shown include unvalidated device data.  Last Pain:  Vitals:   03/16/18 1518  TempSrc:   PainSc: 0-No pain      Patients Stated Pain Goal: 8 (17/49/44 9675)  Complications: No apparent anesthesia complications

## 2018-03-16 NOTE — Op Note (Signed)
[facility] Patient Name: Tasha Clarke Procedure Date: 03/16/2018 2:41 PM MRN: [id number] Date of Birth: 1951-05-13 Attending MD: Norvel Richards , MD CSN: [id number] Age: 67 Admit Type: Outpatient Procedure:                Upper GI endoscopy Indications:              Abnormal CT of the GI tract Providers:                Norvel Richards, MD, Rosina Lowenstein, RN, Randa Spike, Technician Referring MD:              Medicines:                Propofol per Anesthesia Complications:            No immediate complications. Estimated Blood Loss:     Estimated blood loss was minimal. Procedure:                Pre-Anesthesia Assessment:                           - Prior to the procedure, a History and Physical                            was performed, and patient medications and                            allergies were reviewed. The patient's tolerance of                            previous anesthesia was also reviewed. The risks                            and benefits of the procedure and the sedation                            options and risks were discussed with the patient.                            All questions were answered, and informed consent                            was obtained. Prior Anticoagulants: The patient has                            taken no previous anticoagulant or antiplatelet                            agents. ASA Grade Assessment: II - A patient with                            mild systemic disease. After reviewing the risks  and benefits, the patient was deemed in                            satisfactory condition to undergo the procedure.                           After obtaining informed consent, the endoscope was                            passed under direct vision. Throughout the                            procedure, the patient's blood pressure, pulse, and                            oxygen  saturations were monitored continuously. The                            [id number]-[id number] ([id number]) scope was introduced through the                            and advanced to the second part of duodenum. The                            upper GI endoscopy was accomplished without                            difficulty. The patient tolerated the procedure                            well. Scope In: 3:07:24 PM Scope Out: 3:15:31 PM Total Procedure Duration: 0 hours 8 minutes 7 seconds  Findings:      The examined esophagus was normal.      Congested mucosa was found in the gastric antrum. somewhat deformed.       Deep folds. I did not see a infiltrating process. Pylorus remain patent.       Patchy erythema of the gastric mucosa diffusely. I got a good look at       the entire gastric mucosa and saw no tumor, mass or ulcer.      The duodenal bulb and second portion of the duodenum were normal.       Biopsies of the abnormal antrum mucosa taken for histologic study. Impression:               - Normal esophagus.                           - Congestive gastropathy.                           - Normal duodenal bulb and second portion of the                            duodenum.                           - .  Moderate Sedation:      Moderate (conscious) sedation was personally administered by an       anesthesia professional. The following parameters were monitored: oxygen       saturation, heart rate, blood pressure, respiratory rate, EKG, adequacy       of pulmonary ventilation, and response to care. Total physician       intraservice time was 13 minutes. Recommendation:           - Patient has a contact number available for                            emergencies. The signs and symptoms of potential                            delayed complications were discussed with the                            patient. Return to normal activities tomorrow.                            Written discharge instructions were  provided to the                            patient.                           - Resume previous diet.                           - Continue present medications.                           - Await pathology results.                           - Procedure Code(s):        --- Professional ---                           [phone], Esophagogastroduodenoscopy, flexible,                            transoral; diagnostic, including collection of                            specimen(s) by brushing or washing, when performed                            (separate procedure) Diagnosis Code(s):        --- Professional ---                           K31.89, Other diseases of stomach and duodenum                           R93.3, Abnormal findings on diagnostic imaging of  other parts of digestive tract CPT copyright 2017 [employer]. All rights reserved. The codes documented in this report are preliminary and upon coder review may  be revised to meet current compliance requirements. [name]. Rourk, MD Norvel Richards, MD 03/16/2018 3:23:36 PM This report has been signed electronically. Number of Addenda: 0

## 2018-03-16 NOTE — Progress Notes (Signed)
BP 91/61   Pulse 86   Temp 97.8 F (36.6 C) (Oral)   SpO2 96%    Subjective:    Patient ID: Tasha Clarke, female    DOB: September 12, 1950, 67 y.o.   MRN: [id number]  HPI: Tasha Clarke is a 67 y.o. female presenting on 03/16/2018 for COPD (1 month follow up)   HPI COPD Patient is coming in for one-month follow-up on COPD.  She says is the first stretch where she feels like she is been doing well and has not been in the hospital in the past month which is a big change for her.  She has continued to use her inhalers and says they are working well for her and although she is continued to smoke she is feeling like things are stable right now.  She denies any more shortness of breath at her baseline or more wheezing or more coughing spells in her baseline.  Patient is currently on 2 L nasal cannula.  Anxiety depression Patient says her nerves are still shot in the Cymbalta although it helped a little bit does not seem to be helping as much.  She denies any saddle ideations.  Patient does feel like she definitely needs something a lot more of her nerves especially with her breathing being the way that it is.  She also feels like it has not helped significantly with the pain at this point.  Relevant past medical, surgical, family and social history reviewed and updated as indicated. Interim medical history since our last visit reviewed. Allergies and medications reviewed and updated.  Review of Systems  Constitutional: Negative for chills and fever.  Eyes: Negative for redness and visual disturbance.  Respiratory: Positive for cough, shortness of breath and wheezing. Negative for chest tightness.   Cardiovascular: Negative for chest pain and leg swelling.  Musculoskeletal: Negative for back pain and gait problem.  Skin: Negative for rash.  Neurological: Negative for light-headedness and headaches.  Psychiatric/Behavioral: Positive for dysphoric mood and sleep disturbance. Negative for  agitation, behavioral problems, self-injury and suicidal ideas. The patient is nervous/anxious.   All other systems reviewed and are negative.   Per HPI unless specifically indicated above   Allergies as of 03/16/2018      Reactions   Cephalexin Hives   Ketorolac Tromethamine Hives   Lac Bovis Nausea And Vomiting   Imdur  [isosorbide Dinitrate]    Iodinated Diagnostic Agents    Other Other (See Comments)   Alka seltzer causes blurred vision, fainting   Sodium Bicarbonate-citric Acid    Milk-related Compounds Nausea And Vomiting      Medication List        Accurate as of 03/16/18 10:29 AM. Always use your most recent med list.          alendronate 70 MG tablet Commonly known as:  FOSAMAX Take 1 tablet (70 mg total) by mouth every Monday. Take with a full glass of water on an empty stomach.   aspirin EC 81 MG tablet Take 81 mg by mouth daily.   atorvastatin 40 MG tablet Commonly known as:  LIPITOR Take 40 mg by mouth daily.   cetirizine 10 MG tablet Commonly known as:  ZYRTEC Take 1 tablet (10 mg total) by mouth daily.   cyclobenzaprine 10 MG tablet Commonly known as:  FLEXERIL Take 1 tablet (10 mg total) by mouth 2 (two) times daily as needed for muscle spasms.   DULoxetine 30 MG capsule Commonly known as:  CYMBALTA  Take 30 mg by mouth daily.   EPINEPHrine 0.3 mg/0.3 mL Soaj injection Commonly known as:  EPI-PEN Inject 0.3 mg into the muscle as needed.   Fish Oil 1000 MG Caps Take 1,000 mg by mouth daily.   fluticasone 50 MCG/ACT nasal spray Commonly known as:  FLONASE Place 2 sprays into both nostrils daily.   fluticasone furoate-vilanterol 200-25 MCG/INH Aepb Commonly known as:  BREO ELLIPTA Inhale 1 puff into the lungs daily.   Fluticasone-Salmeterol 250-50 MCG/DOSE Aepb Commonly known as:  ADVAIR Inhale 1 puff into the lungs 2 (two) times daily.   folic acid 1 MG tablet Commonly known as:  FOLVITE Take 1 mg by mouth daily.   gabapentin 600 MG  tablet Commonly known as:  NEURONTIN Take 1 tablet (600 mg total) by mouth 4 (four) times daily.   ipratropium-albuterol 0.5-2.5 (3) MG/3ML Soln Commonly known as:  DUONEB Take 3 mLs by nebulization every 6 (six) hours as needed (shortness of breathe).   Iron 325 (65 Fe) MG Tabs Take 1 tablet (325 mg total) by mouth daily.   lacosamide 50 MG Tabs tablet Commonly known as:  VIMPAT Take 2 tablets (100 mg total) by mouth 2 (two) times daily for 1 day, THEN 1 tablet (50 mg total) 2 (two) times daily for 1 day. Start taking on:  01/25/2018   levETIRAcetam 1000 MG tablet Commonly known as:  KEPPRA TAKE 1 TABLET BY MOUTH AT BEDTIME   levETIRAcetam 750 MG tablet Commonly known as:  KEPPRA Take 1 tablet (750 mg total) by mouth daily. Take 750 in a.m. and 1000 in p.m.   levothyroxine 50 MCG tablet Commonly known as:  SYNTHROID, LEVOTHROID Take 50 mcg by mouth daily before breakfast.   losartan 100 MG tablet Commonly known as:  COZAAR Take 0.5 tablets (50 mg total) by mouth daily.   Magnesium 400 MG Caps Take 400 mg by mouth daily.   Magnesium Gluconate 550 MG Tabs Take 1 tablet (550 mg total) by mouth daily.   metoprolol tartrate 25 MG tablet Commonly known as:  LOPRESSOR Take 25 mg by mouth 2 (two) times daily.   multivitamin tablet Take 1 tablet by mouth daily.   NARCAN 4 MG/0.1ML Liqd nasal spray kit Generic drug:  naloxone Place into the nose.   PROVENTIL HFA 108 (90 Base) MCG/ACT inhaler Generic drug:  albuterol Inhale into the lungs.   TYLENOL PM EXTRA STRENGTH 25-500 MG Tabs tablet Generic drug:  diphenhydramine-acetaminophen Take 1 tablet by mouth at bedtime as needed (pain).   VITAMIN B-12 PO Take by mouth daily.   Vitamin D-3 5000 units Tabs Take 5,000 Units by mouth daily.          Objective:    BP 91/61   Pulse 86   Temp 97.8 F (36.6 C) (Oral)   SpO2 96%   Wt Readings from Last 3 Encounters:  03/10/18 193 lb 3.2 oz (87.6 kg)  02/28/18 182  lb (82.6 kg)  02/07/18 182 lb (82.6 kg)    Physical Exam  Constitutional: She is oriented to person, place, and time. She appears well-developed and well-nourished. No distress.  Eyes: Conjunctivae are normal.  Cardiovascular: Normal rate, regular rhythm, normal heart sounds and intact distal pulses.  No murmur heard. Pulmonary/Chest: Effort normal. No stridor. No respiratory distress. She has no wheezes. She has rhonchi in the right upper field and the left upper field. She has no rales.  Musculoskeletal: Normal range of motion. She exhibits no edema.  Neurological:  She is alert and oriented to person, place, and time. Coordination normal.  Skin: Skin is warm and dry. No rash noted. She is not diaphoretic.  Psychiatric: Her behavior is normal. Judgment normal. Her mood appears anxious. She exhibits a depressed mood. She expresses no suicidal ideation. She expresses no suicidal plans.  Nursing note and vitals reviewed.       Assessment & Plan:   Problem List Items Addressed This Visit      Respiratory   COPD (chronic obstructive pulmonary disease) ([place]) - Primary     Other   Depression, recurrent ([place])   Relevant Medications   DULoxetine (CYMBALTA) 60 MG capsule       Patient has been on oxygen 2 L nasal cannula off and on since 1985 but consistently at least over the past 5 or 6 years.  She has a portable oxygen tank but it is too difficult to get out and move around with that and we will try to see if we can get her up portable concentrator for when she is out and about.  Her breathing is doing much better.  Patient says her nerves are still shot she is still having some pain especially from her back down her legs, we had started Cymbalta for this and she is on 30 mg but we will increase it because she says is not helping sufficiently right now. Follow up plan: Return in about 8 weeks (around 05/11/2018), or if symptoms worsen or fail to improve, for Recheck COPD and  depression.  Counseling provided for all of the vaccine components No orders of the defined types were placed in this encounter.   Caryl Pina, MD [facility] 03/16/2018, 10:29 AM

## 2018-03-16 NOTE — Anesthesia Postprocedure Evaluation (Signed)
Anesthesia Post Note  Patient: Tasha Clarke  Procedure(s) Performed: ESOPHAGOGASTRODUODENOSCOPY (EGD) WITH PROPOFOL (N/A ) BIOPSY  Patient location during evaluation: PACU Anesthesia Type: MAC Level of consciousness: awake and alert and oriented Pain management: pain level controlled Vital Signs Assessment: post-procedure vital signs reviewed and stable Respiratory status: spontaneous breathing and patient connected to nasal cannula oxygen Cardiovascular status: stable Postop Assessment: no apparent nausea or vomiting Anesthetic complications: no     Last Vitals:  Vitals:   03/16/18 1530 03/16/18 1545  BP: (!) 101/55 133/61  Pulse: 72 71  Resp: 18 17  Temp:    SpO2: 99% 97%    Last Pain:  Vitals:   03/16/18 1545  TempSrc:   PainSc: 0-No pain                 Laurynn Mccorvey A

## 2018-03-16 NOTE — Anesthesia Preprocedure Evaluation (Signed)
Anesthesia Evaluation  Patient identified by MRN, date of birth, ID band Patient awake    Reviewed: Allergy & Precautions, H&P , NPO status , Patient's Chart, lab work & pertinent test results, reviewed documented beta blocker date and time   Airway Mallampati: II  TM Distance: >3 FB Neck ROM: full    Dental no notable dental hx. (+) Edentulous Upper, Edentulous Lower   Pulmonary neg pulmonary ROS, asthma , sleep apnea , pneumonia, COPD,  COPD inhaler and oxygen dependent, Current Smoker,    Pulmonary exam normal breath sounds clear to auscultation       Cardiovascular Exercise Tolerance: Good hypertension, On Medications + Cardiac Stents and +CHF  negative cardio ROS   Rhythm:regular Rate:Normal     Neuro/Psych Seizures -,  PSYCHIATRIC DISORDERS Anxiety Depression  Neuromuscular disease negative neurological ROS  negative psych ROS   GI/Hepatic negative GI ROS, Neg liver ROS, GERD  ,  Endo/Other  negative endocrine ROSdiabetesHypothyroidism   Renal/GU Renal diseasenegative Renal ROS  negative genitourinary   Musculoskeletal   Abdominal   Peds  Hematology negative hematology ROS (+) anemia ,   Anesthesia Other Findings Oxygen, inhaler, nebulizer depended COPD, Ongoing tobacco abuse in setting of COPD Multiple stents  Reproductive/Obstetrics negative OB ROS                             Anesthesia Physical Anesthesia Plan  ASA: IV  Anesthesia Plan: MAC   Post-op Pain Management:    Induction:   PONV Risk Score and Plan:   Airway Management Planned:   Additional Equipment:   Intra-op Plan:   Post-operative Plan:   Informed Consent: I have reviewed the patients History and Physical, chart, labs and discussed the procedure including the risks, benefits and alternatives for the proposed anesthesia with the patient or authorized representative who has indicated his/her  understanding and acceptance.   Dental Advisory Given  Plan Discussed with: CRNA and Anesthesiologist  Anesthesia Plan Comments:         Anesthesia Quick Evaluation

## 2018-03-17 LAB — OVA + PARASITE EXAM

## 2018-03-17 LAB — O&P RESULT

## 2018-03-21 ENCOUNTER — Other Ambulatory Visit: Payer: Self-pay | Admitting: *Deleted

## 2018-03-21 ENCOUNTER — Encounter: Payer: Self-pay | Admitting: Internal Medicine

## 2018-03-21 DIAGNOSIS — Z1231 Encounter for screening mammogram for malignant neoplasm of breast: Secondary | ICD-10-CM | POA: Diagnosis not present

## 2018-03-21 DIAGNOSIS — M961 Postlaminectomy syndrome, not elsewhere classified: Secondary | ICD-10-CM | POA: Diagnosis not present

## 2018-03-21 DIAGNOSIS — G894 Chronic pain syndrome: Secondary | ICD-10-CM | POA: Diagnosis not present

## 2018-03-21 DIAGNOSIS — M47812 Spondylosis without myelopathy or radiculopathy, cervical region: Secondary | ICD-10-CM | POA: Diagnosis not present

## 2018-03-21 DIAGNOSIS — M533 Sacrococcygeal disorders, not elsewhere classified: Secondary | ICD-10-CM | POA: Diagnosis not present

## 2018-03-21 NOTE — Patient Outreach (Signed)
Telephone outreach to pt of Jacqlyn Larsen, RN, Transport planner for [facility]. I spoke to pt's husband, Engineer, materials. I advised of Julie's absence until further notice and that I will look out for Ms. Mayfield. He was very concerned about Almyra Free and sends his sympathy.  Gaspar Bidding reports Rickita is doing much better and hopes her general health will continue to improve. No immediate concerns.   I have advised I will come on the day, Almyra Free was scheduled to come 03/28/18. He is agreeable to that.  [name]. [name], MSN, [facility] Gerontological Nurse Practitioner [facility] Care Management [phone]

## 2018-03-22 ENCOUNTER — Inpatient Hospital Stay (HOSPITAL_COMMUNITY): Payer: Medicare PPO | Attending: Hematology

## 2018-03-22 ENCOUNTER — Encounter (HOSPITAL_COMMUNITY): Payer: Self-pay | Admitting: Internal Medicine

## 2018-03-22 DIAGNOSIS — E785 Hyperlipidemia, unspecified: Secondary | ICD-10-CM | POA: Diagnosis not present

## 2018-03-22 DIAGNOSIS — F418 Other specified anxiety disorders: Secondary | ICD-10-CM | POA: Insufficient documentation

## 2018-03-22 DIAGNOSIS — D508 Other iron deficiency anemias: Secondary | ICD-10-CM

## 2018-03-22 DIAGNOSIS — Z85828 Personal history of other malignant neoplasm of skin: Secondary | ICD-10-CM | POA: Diagnosis not present

## 2018-03-22 DIAGNOSIS — I129 Hypertensive chronic kidney disease with stage 1 through stage 4 chronic kidney disease, or unspecified chronic kidney disease: Secondary | ICD-10-CM | POA: Diagnosis not present

## 2018-03-22 DIAGNOSIS — I509 Heart failure, unspecified: Secondary | ICD-10-CM | POA: Insufficient documentation

## 2018-03-22 DIAGNOSIS — E039 Hypothyroidism, unspecified: Secondary | ICD-10-CM | POA: Diagnosis not present

## 2018-03-22 DIAGNOSIS — D509 Iron deficiency anemia, unspecified: Secondary | ICD-10-CM | POA: Diagnosis not present

## 2018-03-22 DIAGNOSIS — K319 Disease of stomach and duodenum, unspecified: Secondary | ICD-10-CM | POA: Diagnosis not present

## 2018-03-22 DIAGNOSIS — E1122 Type 2 diabetes mellitus with diabetic chronic kidney disease: Secondary | ICD-10-CM | POA: Insufficient documentation

## 2018-03-22 DIAGNOSIS — J449 Chronic obstructive pulmonary disease, unspecified: Secondary | ICD-10-CM | POA: Diagnosis not present

## 2018-03-22 DIAGNOSIS — F17210 Nicotine dependence, cigarettes, uncomplicated: Secondary | ICD-10-CM | POA: Insufficient documentation

## 2018-03-22 DIAGNOSIS — N189 Chronic kidney disease, unspecified: Secondary | ICD-10-CM | POA: Diagnosis not present

## 2018-03-22 DIAGNOSIS — G4730 Sleep apnea, unspecified: Secondary | ICD-10-CM | POA: Insufficient documentation

## 2018-03-22 DIAGNOSIS — M810 Age-related osteoporosis without current pathological fracture: Secondary | ICD-10-CM | POA: Insufficient documentation

## 2018-03-22 DIAGNOSIS — K219 Gastro-esophageal reflux disease without esophagitis: Secondary | ICD-10-CM | POA: Insufficient documentation

## 2018-03-22 LAB — COMPREHENSIVE METABOLIC PANEL
ALBUMIN: 3.5 g/dL (ref 3.5–5.0)
ALK PHOS: 91 U/L (ref 38–126)
ALT: 19 U/L (ref 0–44)
AST: 22 U/L (ref 15–41)
Anion gap: 7 (ref 5–15)
BILIRUBIN TOTAL: 0.2 mg/dL — AB (ref 0.3–1.2)
BUN: 27 mg/dL — AB (ref 8–23)
CALCIUM: 9.7 mg/dL (ref 8.9–10.3)
CO2: 26 mmol/L (ref 22–32)
Chloride: 103 mmol/L (ref 98–111)
Creatinine, Ser: 0.88 mg/dL (ref 0.44–1.00)
GFR calc Af Amer: >60 mL/min (ref >60)
GFR calc non Af Amer: >60 mL/min (ref >60)
GLUCOSE: 133 mg/dL — AB (ref 70–99)
Potassium: 4 mmol/L (ref 3.5–5.1)
Sodium: 136 mmol/L (ref 135–145)
Total Protein: 6.7 g/dL (ref 6.5–8.1)

## 2018-03-22 LAB — CBC WITH DIFFERENTIAL/PLATELET
BASOS ABS: 0 10*3/uL (ref 0.0–0.1)
BASOS PCT: 1 %
Eosinophils Absolute: 0.5 10*3/uL (ref 0.0–0.7)
Eosinophils Relative: 7 %
HEMATOCRIT: 33.8 % — AB (ref 36.0–46.0)
HEMOGLOBIN: 10.7 g/dL — AB (ref 12.0–15.0)
Lymphocytes Relative: 15 %
Lymphs Abs: 1.1 10*3/uL (ref 0.7–4.0)
MCH: 31 pg (ref 26.0–34.0)
MCHC: 31.7 g/dL (ref 30.0–36.0)
MCV: 98 fL (ref 78.0–100.0)
Monocytes Absolute: 0.5 10*3/uL (ref 0.1–1.0)
Monocytes Relative: 7 %
NEUTROS ABS: 5.4 10*3/uL (ref 1.7–7.7)
Neutrophils Relative %: 70 %
Platelets: 264 10*3/uL (ref 150–400)
RBC: 3.45 MIL/uL — AB (ref 3.87–5.11)
RDW: 14.2 % (ref 11.5–15.5)
WBC: 7.5 10*3/uL (ref 4.0–10.5)

## 2018-03-22 LAB — IRON AND TIBC
Iron: 42 ug/dL (ref 28–170)
Saturation Ratios: 12 % (ref 10.4–31.8)
TIBC: 337 ug/dL (ref 250–450)
UIBC: 295 ug/dL

## 2018-03-22 LAB — FERRITIN: FERRITIN: 100 ng/mL (ref 11–307)

## 2018-03-23 ENCOUNTER — Other Ambulatory Visit: Payer: Self-pay | Admitting: Family Medicine

## 2018-03-23 DIAGNOSIS — Z1239 Encounter for other screening for malignant neoplasm of breast: Secondary | ICD-10-CM

## 2018-03-23 DIAGNOSIS — Z1231 Encounter for screening mammogram for malignant neoplasm of breast: Secondary | ICD-10-CM

## 2018-03-23 NOTE — Telephone Encounter (Signed)
Pt scheduled for 04/25/18 at [facility] [place] Letter sent with appointment date/time

## 2018-03-25 DIAGNOSIS — J449 Chronic obstructive pulmonary disease, unspecified: Secondary | ICD-10-CM | POA: Diagnosis not present

## 2018-03-28 ENCOUNTER — Other Ambulatory Visit: Payer: Self-pay | Admitting: *Deleted

## 2018-03-28 ENCOUNTER — Ambulatory Visit: Payer: Medicare PPO | Admitting: *Deleted

## 2018-03-28 NOTE — Patient Outreach (Signed)
[facility]) Care Management  03/28/2018  Tasha Clarke Mayfield 03/23/1951 [id number]   Routine home visit. Pt main problems are COPD, GERD and Chronic pain among multiple other co-morbidities.  She has been advised that her Butrans patch has been approved but she has not received it.  She advises me that she takes ibuprofen 400 mg qid. She hopes to stop this when she starts the [place].  She has all her respiratory medications.  O:  BP 130/80   Pulse 85   Wt 192 lb (87.1 kg)   SpO2 96%   BMI 31.95 kg/m         RRR       Lungs are clear  A:  Chronic pain (pending start of new pain medication)      COPD - not symtomatic today      GERD - not symptomatic today  P:  Will follow up with pt in October. Continue current regimen.  [name]. [name], MSN, [facility] Gerontological Nurse Practitioner [facility] Care Management [phone]

## 2018-03-29 ENCOUNTER — Encounter (HOSPITAL_COMMUNITY): Payer: Self-pay | Admitting: Internal Medicine

## 2018-03-29 ENCOUNTER — Inpatient Hospital Stay (HOSPITAL_BASED_OUTPATIENT_CLINIC_OR_DEPARTMENT_OTHER): Payer: Medicare PPO | Admitting: Internal Medicine

## 2018-03-29 ENCOUNTER — Other Ambulatory Visit: Payer: Self-pay

## 2018-03-29 VITALS — BP 106/48 | HR 74 | Temp 98.3°F | Resp 18

## 2018-03-29 DIAGNOSIS — J449 Chronic obstructive pulmonary disease, unspecified: Secondary | ICD-10-CM

## 2018-03-29 DIAGNOSIS — K219 Gastro-esophageal reflux disease without esophagitis: Secondary | ICD-10-CM

## 2018-03-29 DIAGNOSIS — N189 Chronic kidney disease, unspecified: Secondary | ICD-10-CM

## 2018-03-29 DIAGNOSIS — Z85828 Personal history of other malignant neoplasm of skin: Secondary | ICD-10-CM

## 2018-03-29 DIAGNOSIS — G4730 Sleep apnea, unspecified: Secondary | ICD-10-CM

## 2018-03-29 DIAGNOSIS — F17210 Nicotine dependence, cigarettes, uncomplicated: Secondary | ICD-10-CM | POA: Diagnosis not present

## 2018-03-29 DIAGNOSIS — I129 Hypertensive chronic kidney disease with stage 1 through stage 4 chronic kidney disease, or unspecified chronic kidney disease: Secondary | ICD-10-CM | POA: Diagnosis not present

## 2018-03-29 DIAGNOSIS — M810 Age-related osteoporosis without current pathological fracture: Secondary | ICD-10-CM

## 2018-03-29 DIAGNOSIS — D508 Other iron deficiency anemias: Secondary | ICD-10-CM

## 2018-03-29 DIAGNOSIS — E785 Hyperlipidemia, unspecified: Secondary | ICD-10-CM

## 2018-03-29 DIAGNOSIS — I509 Heart failure, unspecified: Secondary | ICD-10-CM | POA: Diagnosis not present

## 2018-03-29 DIAGNOSIS — K319 Disease of stomach and duodenum, unspecified: Secondary | ICD-10-CM | POA: Diagnosis not present

## 2018-03-29 DIAGNOSIS — D509 Iron deficiency anemia, unspecified: Secondary | ICD-10-CM | POA: Diagnosis not present

## 2018-03-29 DIAGNOSIS — E039 Hypothyroidism, unspecified: Secondary | ICD-10-CM

## 2018-03-29 DIAGNOSIS — F418 Other specified anxiety disorders: Secondary | ICD-10-CM

## 2018-03-29 DIAGNOSIS — E1122 Type 2 diabetes mellitus with diabetic chronic kidney disease: Secondary | ICD-10-CM

## 2018-03-29 NOTE — Progress Notes (Signed)
Diagnosis Other iron deficiency anemia - Plan: CBC with Differential/Platelet, Comprehensive metabolic panel, Lactate dehydrogenase, Ferritin  Staging Cancer Staging No matching staging information was found for the patient.  Assessment and Plan:  1.  Iron deficiency anemia.  Pt was last treated with IV iron in 12/2017.  Stool was tested negative for occult blood on 08/25/2017.  Reportedly had a colonoscopy 3 years ago which was normal.  EGD done 03/16/2018 showed Normal esophagus with gastropathy.    Labs done 03/22/2018 reviewed and showed WBC 7.5 HB 10.7 plts 264,000.  Ferritin 100.  Chemistries WNL with K+ 4, Cr 0.88 and normal LFTS.  Iron levels remain improved after IV iron.    Pt will RTC in 06/2018 for follow-up with Dr. Worthy Keeler and repeat labs.   2.  Smoking.  Cessation recommended. She is referred to Pulmonary due to oxygen therapy and for lung cancer screening evaluation.    3.  HTN.  BP  Is 106/48.  Follow-up with PCP.    4.  Hypothyroidism.  Follow-up with PCP for monitoring.    5.  Health maintenance.  GI and PCP follow-up as recommended.    Current Status:  Pt seen today for follow-up to go over labs.    Problem List Patient Active Problem List   Diagnosis Date Noted  . Abdominal pain [R10.9] 03/10/2018  . Abnormal CT of the abdomen [R93.5] 03/10/2018  . Black tarry stools [K92.1] 03/10/2018  . Diarrhea [R19.7] 03/10/2018  . Hypomagnesemia [E83.42] 01/31/2018  . Tobacco abuse disorder [Z72.0] 01/31/2018  . Essential hypertension [I10] 01/31/2018  . MRSA (methicillin resistant staphylococcus aureus) pneumonia ([place]) [J15.212] 01/31/2018  . Hypokalemia [E87.6] 01/17/2018  . Seizure ([place]) [R56.9] 01/17/2018  . Gastroenteritis [K52.9] 01/17/2018  . Severe sepsis with septic shock ([place]) [A41.9, R65.21] 10/28/2017  . Hypotension [I95.9]   . Iron deficiency anemia [D50.9] 08/25/2017  . COPD (chronic obstructive pulmonary disease) ([place]) [J44.9] 08/24/2017  .  Hyperlipidemia LDL goal <100 [E78.5] 08/24/2017  . Seizure disorder ([place]) [F57.322] 08/24/2017  . Depression, recurrent ([place]) [F33.9] 08/24/2017  . Osteoporosis [M81.0] 08/24/2017  . Chronic pain syndrome [G89.4] 08/24/2017  . Hypothyroid [E03.9] 08/24/2017    Past Medical History Past Medical History:  Diagnosis Date  . Allergy   . Anemia   . Anxiety   . Arthritis   . Asthma   . Blood transfusion without reported diagnosis   . Cataract   . CHF (congestive heart failure) ([place])   . Chronic kidney disease   . Clotting disorder ([place])   . COPD (chronic obstructive pulmonary disease) ([place])   . Depression   . Diabetes mellitus without complication ([place])   . Emphysema of lung ([place])   . GERD (gastroesophageal reflux disease)   . Hyperlipidemia   . Hypertension   . Neuromuscular disorder ([place])   . Osteoporosis   . Oxygen deficiency   . Seizures ([place])   . Skin cancer    2 areas removed more than 10 years ago  . Sleep apnea 1985   wears cpap nightly  . Thyroid disease     Past Surgical History Past Surgical History:  Procedure Laterality Date  . APPENDECTOMY    . BIOPSY  03/16/2018   Procedure: BIOPSY;  Surgeon: Daneil Dolin, MD;  [facility];  Service: Endoscopy;;  gastric bx's  . ESOPHAGOGASTRODUODENOSCOPY (EGD) WITH PROPOFOL N/A 03/16/2018   Procedure: ESOPHAGOGASTRODUODENOSCOPY (EGD) WITH PROPOFOL;  Surgeon: Daneil Dolin, MD;  Location: [facility];  Service: Endoscopy;  Laterality: N/A;  3:00pm  . EYE SURGERY    . FRACTURE SURGERY    . heart stents    . JOINT REPLACEMENT    . SPINAL CORD STIMULATOR IMPLANT  2015  . SPINE SURGERY      Family History Family History  Problem Relation Age of Onset  . Arthritis Mother   . Asthma Mother   . Cancer Mother        lung  . Depression Mother   . Diabetes Mother   . Arthritis Father   . Asthma Father   . Depression Father   . Asthma Sister   . Depression Sister   . Diabetes Sister   . Arthritis  Maternal Grandmother   . Asthma Maternal Grandmother   . Depression Maternal Grandmother   . Arthritis Maternal Grandfather   . Asthma Maternal Grandfather   . Arthritis Paternal Grandmother   . Asthma Paternal Grandmother   . Arthritis Paternal Grandfather   . Asthma Paternal Grandfather   . COPD Paternal Grandfather   . Diabetes Sister   . Mental illness Sister   . Anemia Sister   . Hypertension Son   . Post-traumatic stress disorder Son   . Allergies Daughter      Social History  reports that she has been smoking cigarettes. She has a 32.25 pack-year smoking history. She has never used smokeless tobacco. She reports that she does not drink alcohol or use drugs.  Medications  Current Outpatient Medications:  .  albuterol (PROVENTIL HFA) 108 (90 Base) MCG/ACT inhaler, Inhale into the lungs., Disp: , Rfl:  .  alendronate (FOSAMAX) 70 MG tablet, Take 1 tablet (70 mg total) by mouth every Monday. Take with a full glass of water on an empty stomach., Disp: 13 tablet, Rfl: 3 .  aspirin EC 81 MG tablet, Take 81 mg by mouth daily., Disp: , Rfl:  .  atorvastatin (LIPITOR) 40 MG tablet, Take 40 mg by mouth daily., Disp: , Rfl:  .  buprenorphine (BUTRANS) 10 MCG/HR PTWK patch, Place 10 mcg onto the skin once a week., Disp: , Rfl:  .  butalbital-acetaminophen-caffeine (FIORICET, ESGIC) 50-325-40 MG tablet, Take by mouth 2 (two) times daily as needed for headache or migraine., Disp: , Rfl:  .  cetirizine (ZYRTEC) 10 MG tablet, Take 1 tablet (10 mg total) by mouth daily., Disp: 30 tablet, Rfl: 3 .  Cholecalciferol (VITAMIN D-3) 5000 units TABS, Take 5,000 Units by mouth daily., Disp: , Rfl:  .  Cyanocobalamin (VITAMIN B-12 PO), Take by mouth daily., Disp: , Rfl:  .  cyclobenzaprine (FLEXERIL) 10 MG tablet, Take 1 tablet (10 mg total) by mouth 2 (two) times daily as needed for muscle spasms., Disp: 30 tablet, Rfl: 1 .  diphenhydramine-acetaminophen (TYLENOL PM EXTRA STRENGTH) 25-500 MG TABS  tablet, Take 1 tablet by mouth at bedtime as needed (pain). , Disp: , Rfl:  .  DULoxetine (CYMBALTA) 60 MG capsule, Take 1 capsule (60 mg total) by mouth daily., Disp: 30 capsule, Rfl: 2 .  EPINEPHrine 0.3 mg/0.3 mL IJ SOAJ injection, Inject 0.3 mg into the muscle as needed., Disp: , Rfl:  .  Ferrous Sulfate (IRON) 325 (65 Fe) MG TABS, Take 1 tablet (325 mg total) by mouth daily., Disp: 90 each, Rfl: 0 .  fluticasone (FLONASE) 50 MCG/ACT nasal spray, Place 2 sprays into both nostrils daily., Disp: 16 g, Rfl: 2 .  fluticasone furoate-vilanterol (BREO ELLIPTA) 200-25 MCG/INH AEPB, Inhale 1 puff into the lungs daily., Disp: 28  each, Rfl: 2 .  Fluticasone-Salmeterol (ADVAIR) 250-50 MCG/DOSE AEPB, Inhale 1 puff into the lungs 2 (two) times daily., Disp: , Rfl:  .  folic acid (FOLVITE) 1 MG tablet, Take 1 mg by mouth daily., Disp: , Rfl:  .  gabapentin (NEURONTIN) 600 MG tablet, Take 1 tablet (600 mg total) by mouth 4 (four) times daily., Disp: 120 tablet, Rfl: 2 .  ibuprofen (ADVIL,MOTRIN) 200 MG tablet, Take 200 mg by mouth every 6 (six) hours as needed., Disp: , Rfl:  .  ipratropium-albuterol (DUONEB) 0.5-2.5 (3) MG/3ML SOLN, Take 3 mLs by nebulization every 6 (six) hours as needed (shortness of breathe). , Disp: , Rfl:  .  levETIRAcetam (KEPPRA) 1000 MG tablet, TAKE 1 TABLET BY MOUTH AT BEDTIME, Disp: 30 tablet, Rfl: 2 .  levETIRAcetam (KEPPRA) 750 MG tablet, Take 1 tablet (750 mg total) by mouth daily. Take 750 in a.m. and 1000 in p.m., Disp: 90 tablet, Rfl: 1 .  levothyroxine (SYNTHROID, LEVOTHROID) 50 MCG tablet, Take 50 mcg by mouth daily before breakfast., Disp: , Rfl:  .  losartan (COZAAR) 100 MG tablet, Take 0.5 tablets (50 mg total) by mouth daily., Disp: 45 tablet, Rfl: 3 .  Magnesium 400 MG CAPS, Take 400 mg by mouth daily., Disp: , Rfl:  .  Magnesium Gluconate 550 MG TABS, Take 1 tablet (550 mg total) by mouth daily., Disp: 30 each, Rfl: 5 .  Melatonin 10 MG TABS, Take by mouth., Disp: ,  Rfl:  .  metoprolol tartrate (LOPRESSOR) 25 MG tablet, Take 25 mg by mouth 2 (two) times daily., Disp: , Rfl:  .  Multiple Vitamin (MULTIVITAMIN) tablet, Take 1 tablet by mouth daily., Disp: , Rfl:  .  naloxone (NARCAN) nasal spray 4 mg/0.1 mL, Place into the nose., Disp: , Rfl:  .  Omega-3 Fatty Acids (FISH OIL) 1000 MG CAPS, Take 1,000 mg by mouth daily., Disp: , Rfl:   Allergies Cephalexin; Ketorolac tromethamine; [place]; Imdur  [isosorbide dinitrate]; Iodinated diagnostic agents; Other; Sodium bicarbonate-citric acid; and Milk-related compounds  Review of Systems Review of Systems - Oncology ROS negative other than fatigue, diarrhea, nausea,    Physical Exam  Vitals Wt Readings from Last 3 Encounters:  03/28/18 192 lb (87.1 kg)  03/10/18 193 lb 3.2 oz (87.6 kg)  02/28/18 182 lb (82.6 kg)   Temp Readings from Last 3 Encounters:  03/29/18 98.3 F (36.8 C) (Oral)  03/16/18 98 F (36.7 C)  03/16/18 97.8 F (36.6 C) (Oral)   BP Readings from Last 3 Encounters:  03/29/18 (!) 106/48  03/28/18 130/80  03/16/18 (!) 141/78   Pulse Readings from Last 3 Encounters:  03/29/18 74  03/28/18 85  03/16/18 82    Constitutional: Well-developed, well-nourished, and in no distress.   HENT: Head: Normocephalic and atraumatic. O2 via Nasal cannula. Mouth/Throat: No oropharyngeal exudate. Mucosa moist. Eyes: Pupils are equal, round, and reactive to light. Conjunctivae are normal. No scleral icterus.  Neck: Normal range of motion. Neck supple. No JVD present.  Cardiovascular: Normal rate, regular rhythm and normal heart sounds.  Exam reveals no gallop and no friction rub.   No murmur heard. Pulmonary/Chest: Effort normal and breath sounds normal. No respiratory distress. No wheezes.No rales.  Abdominal: Soft. Bowel sounds are normal. No distension. There is no tenderness. There is no guarding.  Musculoskeletal: No edema or tenderness.  Lymphadenopathy: No cervical, axilalry or  supraclavicular adenopathy.  Neurological: Alert and oriented to person, place, and time. No cranial nerve deficit.  Skin: Skin is warm and dry. No rash noted. No erythema. No pallor.  Psychiatric: Affect and judgment normal.   Labs No visits with results within 3 Day(s) from this visit.  Latest known visit with results is:  Appointment on 03/22/2018  Component Date Value Ref Range Status  . Sodium 03/22/2018 136  135 - 145 mmol/L Final  . Potassium 03/22/2018 4.0  3.5 - 5.1 mmol/L Final  . Chloride 03/22/2018 103  98 - 111 mmol/L Final  . CO2 03/22/2018 26  22 - 32 mmol/L Final  . Glucose, Bld 03/22/2018 133* 70 - 99 mg/dL Final  . BUN 03/22/2018 27* 8 - 23 mg/dL Final  . Creatinine, Ser 03/22/2018 0.88  0.44 - 1.00 mg/dL Final  . Calcium 03/22/2018 9.7  8.9 - 10.3 mg/dL Final  . Total Protein 03/22/2018 6.7  6.5 - 8.1 g/dL Final  . Albumin 03/22/2018 3.5  3.5 - 5.0 g/dL Final  . AST 03/22/2018 22  15 - 41 U/L Final  . ALT 03/22/2018 19  0 - 44 U/L Final  . Alkaline Phosphatase 03/22/2018 91  38 - 126 U/L Final  . Total Bilirubin 03/22/2018 0.2* 0.3 - 1.2 mg/dL Final  . GFR calc non Af Amer 03/22/2018 >60  >60 mL/min Final  . GFR calc Af Amer 03/22/2018 >60  >60 mL/min Final   Comment: (NOTE) The eGFR has been calculated using the CKD EPI equation. This calculation has not been validated in all clinical situations. eGFR's persistently <60 mL/min signify possible Chronic Kidney Disease.   Georgiann Hahn gap 03/22/2018 7  5 - 15 Final   Performed at [facility], [address] [place], [place] [address]  . Iron 03/22/2018 42  28 - 170 ug/dL Final  . TIBC 03/22/2018 337  250 - 450 ug/dL Final  . Saturation Ratios 03/22/2018 12  10.4 - 31.8 % Final  . UIBC 03/22/2018 295  ug/dL Final   Performed at [facility], [address] [place], [place] [address]  . Ferritin 03/22/2018 100  11 - 307 ng/mL Final   Performed at [facility], [address], [place], [place] [address]  . WBC  03/22/2018 7.5  4.0 - 10.5 K/uL Final  . RBC 03/22/2018 3.45* 3.87 - 5.11 MIL/uL Final  . Hemoglobin 03/22/2018 10.7* 12.0 - 15.0 g/dL Final  . HCT 03/22/2018 33.8* 36.0 - 46.0 % Final  . MCV 03/22/2018 98.0  78.0 - 100.0 fL Final  . MCH 03/22/2018 31.0  26.0 - 34.0 pg Final  . MCHC 03/22/2018 31.7  30.0 - 36.0 g/dL Final  . RDW 03/22/2018 14.2  11.5 - 15.5 % Final  . Platelets 03/22/2018 264  150 - 400 K/uL Final  . Neutrophils Relative % 03/22/2018 70  % Final  . Neutro Abs 03/22/2018 5.4  1.7 - 7.7 K/uL Final  . Lymphocytes Relative 03/22/2018 15  % Final  . Lymphs Abs 03/22/2018 1.1  0.7 - 4.0 K/uL Final  . Monocytes Relative 03/22/2018 7  % Final  . Monocytes Absolute 03/22/2018 0.5  0.1 - 1.0 K/uL Final  . Eosinophils Relative 03/22/2018 7  % Final  . Eosinophils Absolute 03/22/2018 0.5  0.0 - 0.7 K/uL Final  . Basophils Relative 03/22/2018 1  % Final  . Basophils Absolute 03/22/2018 0.0  0.0 - 0.1 K/uL Final   Performed at [facility], [address], [place], [place] [address]     Pathology Orders Placed This Encounter  Procedures  . CBC with Differential/Platelet  Standing Status:   Future    Standing Expiration Date:   03/30/2019  . Comprehensive metabolic panel    Standing Status:   Future    Standing Expiration Date:   03/30/2019  . Lactate dehydrogenase    Standing Status:   Future    Standing Expiration Date:   03/30/2019  . Ferritin    Standing Status:   Future    Standing Expiration Date:   03/30/2019       Zoila Shutter MD

## 2018-03-30 ENCOUNTER — Encounter (HOSPITAL_COMMUNITY): Payer: Self-pay | Admitting: Lab

## 2018-03-30 NOTE — Progress Notes (Unsigned)
Referral sent to Dr Luan Pulling.  Records faxed on 9/19

## 2018-03-31 ENCOUNTER — Other Ambulatory Visit: Payer: Self-pay | Admitting: Family Medicine

## 2018-04-19 ENCOUNTER — Ambulatory Visit: Payer: Medicare PPO | Admitting: *Deleted

## 2018-04-21 DIAGNOSIS — F1120 Opioid dependence, uncomplicated: Secondary | ICD-10-CM | POA: Diagnosis not present

## 2018-04-21 DIAGNOSIS — Z9689 Presence of other specified functional implants: Secondary | ICD-10-CM | POA: Diagnosis not present

## 2018-04-21 DIAGNOSIS — M47812 Spondylosis without myelopathy or radiculopathy, cervical region: Secondary | ICD-10-CM | POA: Diagnosis not present

## 2018-04-21 DIAGNOSIS — M533 Sacrococcygeal disorders, not elsewhere classified: Secondary | ICD-10-CM | POA: Diagnosis not present

## 2018-04-21 DIAGNOSIS — M961 Postlaminectomy syndrome, not elsewhere classified: Secondary | ICD-10-CM | POA: Diagnosis not present

## 2018-04-21 DIAGNOSIS — G894 Chronic pain syndrome: Secondary | ICD-10-CM | POA: Diagnosis not present

## 2018-04-24 ENCOUNTER — Ambulatory Visit: Payer: Self-pay | Admitting: *Deleted

## 2018-04-24 DIAGNOSIS — J449 Chronic obstructive pulmonary disease, unspecified: Secondary | ICD-10-CM | POA: Diagnosis not present

## 2018-04-25 ENCOUNTER — Ambulatory Visit
Admission: RE | Admit: 2018-04-25 | Discharge: 2018-04-25 | Disposition: A | Discharge status: Home / Self Care | Payer: Medicare PPO | Source: Ambulatory Visit | Attending: Family Medicine | Admitting: Family Medicine

## 2018-04-25 DIAGNOSIS — Z1231 Encounter for screening mammogram for malignant neoplasm of breast: Secondary | ICD-10-CM

## 2018-04-26 ENCOUNTER — Other Ambulatory Visit: Payer: Self-pay | Admitting: Family Medicine

## 2018-04-26 DIAGNOSIS — R928 Other abnormal and inconclusive findings on diagnostic imaging of breast: Secondary | ICD-10-CM

## 2018-04-26 DIAGNOSIS — N6310 Unspecified lump in the right breast, unspecified quadrant: Secondary | ICD-10-CM

## 2018-05-01 ENCOUNTER — Other Ambulatory Visit: Payer: Self-pay | Admitting: Family Medicine

## 2018-05-01 ENCOUNTER — Other Ambulatory Visit: Payer: Self-pay | Admitting: *Deleted

## 2018-05-01 NOTE — Telephone Encounter (Signed)
Last seen 03/16/18

## 2018-05-01 NOTE — Patient Outreach (Signed)
[place] [facility]) Care Management  [place]  05/01/2018   Tasha Clarke Mar 11, 1951 [id number]  Subjective:   Routine home visit. Pt has finally received her chronic pain medication, Butrans 10 mg. She reports her pain level has come down to an 8 which was her goal last month. Now since she has this newer medication she can hope that she can get even better pain relief and her new goal is 4 or below.   She has had a respiratory/GI virus for a week. She thinks she has pneumonia. She DOES NOT want to go to the hospital. She is to have a first visit with Dr. Luan Pulling tomorrow. Pt does wear O2 prn and uses her nebulizer   She has had her mammogram and she has a mass in her L breast which is very tender. She will have an ultrasound at the end of the month.    Pt has been taking ibuprofen as an adjuctive pain relief, which concerns me with her hx of GI bleeding. She states acetaminophen does not settle well on her stomach.               Objective: BP 130/70   Pulse 68   Temp 98.1 F (36.7 C)   Resp (!) 22   SpO2 97%  Sinus drainage Heart murmur Lungs with bilat crackles and wheezing. Wears O2 as needed.  Encounter Medications:  Outpatient Encounter Medications as of 05/01/2018  Medication Sig Note  . albuterol (PROVENTIL HFA) 108 (90 Base) MCG/ACT inhaler Inhale into the lungs.   [place] Kitchen alendronate (FOSAMAX) 70 MG tablet Take 1 tablet (70 mg total) by mouth every Monday. Take with a full glass of water on an empty stomach.   [place] Kitchen aspirin EC 81 MG tablet Take 81 mg by mouth daily.   [place] Kitchen atorvastatin (LIPITOR) 40 MG tablet Take 40 mg by mouth daily.   . buprenorphine (BUTRANS) 10 MCG/HR PTWK patch Place 10 mcg onto the skin once a week.   . butalbital-acetaminophen-caffeine (FIORICET, ESGIC) 50-325-40 MG tablet Take by mouth 2 (two) times daily as needed for headache or migraine.   . cetirizine (ZYRTEC) 10 MG tablet TAKE 1 TABLET BY MOUTH ONCE DAILY   . Cholecalciferol  (VITAMIN D-3) 5000 units TABS Take 5,000 Units by mouth daily.   . Cyanocobalamin (VITAMIN B-12 PO) Take by mouth daily.   . cyclobenzaprine (FLEXERIL) 10 MG tablet Take 1 tablet (10 mg total) by mouth 2 (two) times daily as needed for muscle spasms.   . diphenhydramine-acetaminophen (TYLENOL PM EXTRA STRENGTH) 25-500 MG TABS tablet Take 1 tablet by mouth at bedtime as needed (pain).    . DULoxetine (CYMBALTA) 60 MG capsule Take 1 capsule (60 mg total) by mouth daily.   Mar[place] Kitchen EPINEPHrine 0.3 mg/0.3 mL IJ SOAJ injection Inject 0.3 mg into the muscle as needed. 03/28/2018: Has an epipen. Is allergic to bees.  . Ferrous Sulfate (IRON) 325 (65 Fe) MG TABS Take 1 tablet (325 mg total) by mouth daily.   . fluticasone (FLONASE) 50 MCG/ACT nasal spray Place 2 sprays into both nostrils daily.   . fluticasone furoate-vilanterol (BREO ELLIPTA) 200-25 MCG/INH AEPB Inhale 1 puff into the lungs daily.   . Fluticasone-Salmeterol (ADVAIR) 250-50 MCG/DOSE AEPB Inhale 1 puff into the lungs 2 (two) times daily.   . folic acid (FOLVITE) 1 MG tablet Take 1 mg by mouth daily.   Marland Kitchen gabapentin (NEURONTIN) 600 MG tablet Take 1 tablet (600 mg total) by mouth 4 (  four) times daily.   Marland Kitchen ibuprofen (ADVIL,MOTRIN) 200 MG tablet Take 200 mg by mouth every 6 (six) hours as needed.   Marland Kitchen ipratropium-albuterol (DUONEB) 0.5-2.5 (3) MG/3ML SOLN Take 3 mLs by nebulization every 6 (six) hours as needed (shortness of breathe).    . levETIRAcetam (KEPPRA) 1000 MG tablet TAKE 1 TABLET BY MOUTH AT BEDTIME   . levETIRAcetam (KEPPRA) 750 MG tablet Take 1 tablet (750 mg total) by mouth daily. Take 750 in a.m. and 1000 in p.m.   Marland Kitchen levothyroxine (SYNTHROID, LEVOTHROID) 50 MCG tablet Take 50 mcg by mouth daily before breakfast.   . losartan (COZAAR) 100 MG tablet Take 0.5 tablets (50 mg total) by mouth daily.   . Magnesium 400 MG CAPS Take 400 mg by mouth daily.   . Magnesium Gluconate 550 MG TABS Take 1 tablet (550 mg total) by mouth daily. 01/27/2018:  Pt is taking Magnesium OTC 400mg  1.5 tablets daily   . Melatonin 10 MG TABS Take by mouth.   . metoprolol tartrate (LOPRESSOR) 25 MG tablet Take 25 mg by mouth 2 (two) times daily.   . Multiple Vitamin (MULTIVITAMIN) tablet Take 1 tablet by mouth daily.   . naloxone (NARCAN) nasal spray 4 mg/0.1 mL Place into the nose. 03/28/2018: Has but has never needed to use it.  . Omega-3 Fatty Acids (FISH OIL) 1000 MG CAPS Take 1,000 mg by mouth daily.    No facility-administered encounter medications on file as of 05/01/2018.     Functional Status:  In your present state of health, do you have any difficulty performing the following activities: 02/07/2018 01/28/2018  Hearing? Y N  Comment left ear some hearing loss -  Vision? N N  Difficulty concentrating or making decisions? N N  Walking or climbing stairs? Y Y  Dressing or bathing? N N  Doing errands, shopping? Y N  Preparing Food and eating ? N -  Using the Toilet? N -  In the past six months, have you accidently leaked urine? Y -  Do you have problems with loss of bowel control? N -  Managing your Medications? Y -  Managing your Finances? Y -  Housekeeping or managing your Housekeeping? Y -   Fall/Depression Screening: Fall Risk  03/16/2018 02/13/2018 02/07/2018  Falls in the past year? Yes Yes Yes  Number falls in past yr: 1 1 1   Injury with Fall? No No No  Comment - - -  Risk for fall due to : - - History of fall(s)  Follow up - - Education provided;Falls evaluation completed;Falls prevention discussed   PHQ 2/9 Scores 03/16/2018 02/13/2018 02/07/2018 01/26/2018 10/24/2017 09/23/2017 08/24/2017  PHQ - 2 Score 0 2 0 0 2 1 3   PHQ- 9 Score - 11 - - 13 - 13    Assessment: Respiratory Illness on COPD                        Chronic pain                         Breast mass  Plan: Today we discussed if she would have surgery if needed on her breast. She states she would go.            We also discussed palliative care as a continuum of care after  [place] [facility], which pt was not familiar with. Education was given.  I call pt next week for follow up.  [name]. [name], MSN, [facility] Gerontological Nurse Practitioner [facility] Care Management [phone]

## 2018-05-08 ENCOUNTER — Other Ambulatory Visit: Payer: Self-pay | Admitting: *Deleted

## 2018-05-08 NOTE — Patient Outreach (Signed)
Follow up phone call. I was not able to reach Mrs. Tasha Clarke this am. I did leave a voice mail for her or her husband to return my call.  [name]. [name], MSN, GNP-BC Gerontological Nurse Practitioner [facility] Care Management [phone]  Mr. Tasha Clarke returned my call and he reports that today, Tasha Clarke is doing much better than on Friday. I advised him to tell [name] I called and for her to call me if she needs anything. She is to have a breast ultrasound on the 30th. I will call her for another follow up on Monday, Nov. 11th.  [name]. [name], MSN, [facility] Gerontological Nurse Practitioner [facility] Care Management [phone]

## 2018-05-10 ENCOUNTER — Inpatient Hospital Stay: Admission: RE | Admit: 2018-05-10 | Payer: Medicare PPO | Source: Ambulatory Visit

## 2018-05-11 ENCOUNTER — Ambulatory Visit
Admission: RE | Admit: 2018-05-11 | Discharge: 2018-05-11 | Disposition: A | Discharge status: Home / Self Care | Payer: Medicare PPO | Source: Ambulatory Visit | Attending: Family Medicine | Admitting: Family Medicine

## 2018-05-11 DIAGNOSIS — R928 Other abnormal and inconclusive findings on diagnostic imaging of breast: Secondary | ICD-10-CM

## 2018-05-11 DIAGNOSIS — N6011 Diffuse cystic mastopathy of right breast: Secondary | ICD-10-CM | POA: Diagnosis not present

## 2018-05-17 ENCOUNTER — Other Ambulatory Visit: Payer: Self-pay

## 2018-05-17 ENCOUNTER — Encounter: Payer: Self-pay | Admitting: Family Medicine

## 2018-05-17 ENCOUNTER — Encounter (HOSPITAL_COMMUNITY): Payer: Self-pay | Admitting: Emergency Medicine

## 2018-05-17 ENCOUNTER — Emergency Department (HOSPITAL_COMMUNITY): Payer: Medicare PPO

## 2018-05-17 ENCOUNTER — Observation Stay (HOSPITAL_COMMUNITY)
Admission: EM | Admit: 2018-05-17 | Discharge: 2018-05-18 | Disposition: A | Discharge status: Home / Self Care | Payer: Medicare PPO | Attending: Internal Medicine | Admitting: Internal Medicine

## 2018-05-17 ENCOUNTER — Ambulatory Visit: Payer: Medicare PPO | Admitting: Family Medicine

## 2018-05-17 VITALS — BP 78/62 | HR 73 | Temp 97.6°F

## 2018-05-17 DIAGNOSIS — F17210 Nicotine dependence, cigarettes, uncomplicated: Secondary | ICD-10-CM | POA: Insufficient documentation

## 2018-05-17 DIAGNOSIS — E1122 Type 2 diabetes mellitus with diabetic chronic kidney disease: Secondary | ICD-10-CM | POA: Diagnosis not present

## 2018-05-17 DIAGNOSIS — Z79899 Other long term (current) drug therapy: Secondary | ICD-10-CM | POA: Insufficient documentation

## 2018-05-17 DIAGNOSIS — Z7982 Long term (current) use of aspirin: Secondary | ICD-10-CM | POA: Diagnosis not present

## 2018-05-17 DIAGNOSIS — R5381 Other malaise: Secondary | ICD-10-CM | POA: Diagnosis not present

## 2018-05-17 DIAGNOSIS — G894 Chronic pain syndrome: Secondary | ICD-10-CM | POA: Diagnosis not present

## 2018-05-17 DIAGNOSIS — I10 Essential (primary) hypertension: Secondary | ICD-10-CM | POA: Diagnosis not present

## 2018-05-17 DIAGNOSIS — R569 Unspecified convulsions: Secondary | ICD-10-CM | POA: Diagnosis not present

## 2018-05-17 DIAGNOSIS — E039 Hypothyroidism, unspecified: Secondary | ICD-10-CM | POA: Diagnosis not present

## 2018-05-17 DIAGNOSIS — R1032 Left lower quadrant pain: Secondary | ICD-10-CM

## 2018-05-17 DIAGNOSIS — G40909 Epilepsy, unspecified, not intractable, without status epilepticus: Secondary | ICD-10-CM | POA: Diagnosis not present

## 2018-05-17 DIAGNOSIS — J9811 Atelectasis: Secondary | ICD-10-CM | POA: Diagnosis not present

## 2018-05-17 DIAGNOSIS — R0902 Hypoxemia: Secondary | ICD-10-CM | POA: Diagnosis not present

## 2018-05-17 DIAGNOSIS — R0602 Shortness of breath: Secondary | ICD-10-CM | POA: Diagnosis not present

## 2018-05-17 DIAGNOSIS — Z85828 Personal history of other malignant neoplasm of skin: Secondary | ICD-10-CM | POA: Insufficient documentation

## 2018-05-17 DIAGNOSIS — Z23 Encounter for immunization: Secondary | ICD-10-CM | POA: Diagnosis not present

## 2018-05-17 DIAGNOSIS — R1110 Vomiting, unspecified: Secondary | ICD-10-CM | POA: Diagnosis not present

## 2018-05-17 DIAGNOSIS — Z720 Tobacco use: Secondary | ICD-10-CM | POA: Diagnosis present

## 2018-05-17 DIAGNOSIS — I959 Hypotension, unspecified: Secondary | ICD-10-CM | POA: Diagnosis not present

## 2018-05-17 DIAGNOSIS — R197 Diarrhea, unspecified: Secondary | ICD-10-CM | POA: Diagnosis not present

## 2018-05-17 DIAGNOSIS — R4020 Unspecified coma: Secondary | ICD-10-CM | POA: Diagnosis not present

## 2018-05-17 DIAGNOSIS — J449 Chronic obstructive pulmonary disease, unspecified: Principal | ICD-10-CM | POA: Insufficient documentation

## 2018-05-17 DIAGNOSIS — Z9189 Other specified personal risk factors, not elsewhere classified: Secondary | ICD-10-CM

## 2018-05-17 DIAGNOSIS — J849 Interstitial pulmonary disease, unspecified: Secondary | ICD-10-CM | POA: Diagnosis not present

## 2018-05-17 DIAGNOSIS — N189 Chronic kidney disease, unspecified: Secondary | ICD-10-CM | POA: Diagnosis not present

## 2018-05-17 DIAGNOSIS — G40409 Other generalized epilepsy and epileptic syndromes, not intractable, without status epilepticus: Secondary | ICD-10-CM | POA: Diagnosis not present

## 2018-05-17 DIAGNOSIS — I129 Hypertensive chronic kidney disease with stage 1 through stage 4 chronic kidney disease, or unspecified chronic kidney disease: Secondary | ICD-10-CM | POA: Insufficient documentation

## 2018-05-17 LAB — COMPREHENSIVE METABOLIC PANEL
ALK PHOS: 70 U/L (ref 38–126)
ALT: 17 U/L (ref 0–44)
AST: 19 U/L (ref 15–41)
Albumin: 3.9 g/dL (ref 3.5–5.0)
Anion gap: 8 (ref 5–15)
BUN: 29 mg/dL — ABNORMAL HIGH (ref 8–23)
CALCIUM: 9 mg/dL (ref 8.9–10.3)
CHLORIDE: 104 mmol/L (ref 98–111)
CO2: 23 mmol/L (ref 22–32)
CREATININE: 1.1 mg/dL — AB (ref 0.44–1.00)
GFR, EST AFRICAN AMERICAN: 59 mL/min — AB (ref >60)
GFR, EST NON AFRICAN AMERICAN: 51 mL/min — AB (ref >60)
Glucose, Bld: 113 mg/dL — ABNORMAL HIGH (ref 70–99)
Potassium: 4.1 mmol/L (ref 3.5–5.1)
Sodium: 135 mmol/L (ref 135–145)
Total Bilirubin: 0.5 mg/dL (ref 0.3–1.2)
Total Protein: 7.2 g/dL (ref 6.5–8.1)

## 2018-05-17 LAB — CBC WITH DIFFERENTIAL/PLATELET
Abs Immature Granulocytes: 0.1 10*3/uL — ABNORMAL HIGH (ref 0.00–0.07)
Basophils Absolute: 0.1 10*3/uL (ref 0.0–0.1)
Basophils Relative: 1 %
EOS ABS: 0.5 10*3/uL (ref 0.0–0.5)
EOS PCT: 5 %
HCT: 36.9 % (ref 36.0–46.0)
HEMOGLOBIN: 11.1 g/dL — AB (ref 12.0–15.0)
Immature Granulocytes: 1 %
LYMPHS PCT: 23 %
Lymphs Abs: 2.3 10*3/uL (ref 0.7–4.0)
MCH: 29.1 pg (ref 26.0–34.0)
MCHC: 30.1 g/dL (ref 30.0–36.0)
MCV: 96.9 fL (ref 80.0–100.0)
MONO ABS: 0.8 10*3/uL (ref 0.1–1.0)
Monocytes Relative: 8 %
Neutro Abs: 6.1 10*3/uL (ref 1.7–7.7)
Neutrophils Relative %: 62 %
Platelets: 310 10*3/uL (ref 150–400)
RBC: 3.81 MIL/uL — ABNORMAL LOW (ref 3.87–5.11)
RDW: 13.4 % (ref 11.5–15.5)
WBC: 9.8 10*3/uL (ref 4.0–10.5)
nRBC: 0 % (ref 0.0–0.2)

## 2018-05-17 LAB — URINALYSIS, ROUTINE W REFLEX MICROSCOPIC
Bilirubin Urine: NEGATIVE
Glucose, UA: NEGATIVE mg/dL
Hgb urine dipstick: NEGATIVE
Ketones, ur: NEGATIVE mg/dL
Leukocytes, UA: NEGATIVE
NITRITE: NEGATIVE
PH: 5 (ref 5.0–8.0)
Protein, ur: NEGATIVE mg/dL
Specific Gravity, Urine: 1.011 (ref 1.005–1.030)

## 2018-05-17 LAB — RAPID URINE DRUG SCREEN, HOSP PERFORMED
AMPHETAMINES: NOT DETECTED
BARBITURATES: POSITIVE — AB
Benzodiazepines: NOT DETECTED
Cocaine: NOT DETECTED
Opiates: NOT DETECTED
Tetrahydrocannabinol: NOT DETECTED

## 2018-05-17 LAB — I-STAT CG4 LACTIC ACID, ED
LACTIC ACID, VENOUS: 0.89 mmol/L (ref 0.5–1.9)
LACTIC ACID, VENOUS: 1.08 mmol/L (ref 0.5–1.9)
Lactic Acid, Venous: 0.99 mmol/L (ref 0.5–1.9)

## 2018-05-17 LAB — BRAIN NATRIURETIC PEPTIDE: B Natriuretic Peptide: 32 pg/mL (ref 0.0–100.0)

## 2018-05-17 LAB — LIPASE, BLOOD: Lipase: 29 U/L (ref 11–51)

## 2018-05-17 LAB — TROPONIN I: Troponin I: <0.03 ng/mL (ref <0.03)

## 2018-05-17 LAB — ETHANOL: Alcohol, Ethyl (B): <10 mg/dL (ref <10)

## 2018-05-17 LAB — MAGNESIUM: MAGNESIUM: 1.6 mg/dL — AB (ref 1.7–2.4)

## 2018-05-17 LAB — CBG MONITORING, ED: Glucose-Capillary: 102 mg/dL — ABNORMAL HIGH (ref 70–99)

## 2018-05-17 MED ORDER — FLUTICASONE PROPIONATE 50 MCG/ACT NA SUSP: 2.0000 | Freq: Every day | NASAL | 2 refills | Status: DC

## 2018-05-17 MED ORDER — LEVETIRACETAM IN NACL 1000 MG/100ML IV SOLN
1000.0000 mg | Freq: Once | INTRAVENOUS | Status: AC
  Administered 2018-05-17: 1000 mg via INTRAVENOUS
  Filled 2018-05-17: qty 100

## 2018-05-17 MED ORDER — LOSARTAN POTASSIUM 100 MG PO TABS: 50.0000 mg | ORAL_TABLET | Freq: Every day | ORAL | 3 refills | Status: DC

## 2018-05-17 MED ORDER — SODIUM CHLORIDE 0.9 % IV SOLN
INTRAVENOUS | Status: DC
  Administered 2018-05-17 – 2018-05-18 (×2): via INTRAVENOUS

## 2018-05-17 MED ORDER — ORAL CARE MOUTH RINSE: 15.0000 mL | Freq: Two times a day (BID) | OROMUCOSAL | Status: DC

## 2018-05-17 MED ORDER — POLYETHYLENE GLYCOL 3350 17 G PO PACK: 17.0000 g | PACK | Freq: Every day | ORAL | Status: DC | PRN

## 2018-05-17 MED ORDER — FLUTICASONE-SALMETEROL 250-50 MCG/DOSE IN AEPB: 1.0000 | INHALATION_SPRAY | Freq: Two times a day (BID) | RESPIRATORY_TRACT | 6 refills | Status: DC

## 2018-05-17 MED ORDER — LEVETIRACETAM IN NACL 1000 MG/100ML IV SOLN
1000.0000 mg | Freq: Two times a day (BID) | INTRAVENOUS | Status: DC
  Administered 2018-05-18: 1000 mg via INTRAVENOUS
  Filled 2018-05-17: qty 100

## 2018-05-17 MED ORDER — INFLUENZA VAC SPLIT HIGH-DOSE 0.5 ML IM SUSY
0.5000 mL | PREFILLED_SYRINGE | INTRAMUSCULAR | Status: AC
  Administered 2018-05-18: 0.5 mL via INTRAMUSCULAR
  Filled 2018-05-17: qty 0.5

## 2018-05-17 MED ORDER — CHLORHEXIDINE GLUCONATE 0.12 % MT SOLN
15.0000 mL | Freq: Two times a day (BID) | OROMUCOSAL | Status: DC
  Administered 2018-05-17: 15 mL via OROMUCOSAL
  Filled 2018-05-17: qty 15

## 2018-05-17 MED ORDER — IPRATROPIUM-ALBUTEROL 0.5-2.5 (3) MG/3ML IN SOLN
3.0000 mL | Freq: Once | RESPIRATORY_TRACT | Status: AC
  Administered 2018-05-17: 3 mL via RESPIRATORY_TRACT
  Filled 2018-05-17: qty 3

## 2018-05-17 MED ORDER — ENOXAPARIN SODIUM 120 MG/0.8ML ~~LOC~~ SOLN
110.0000 mg | SUBCUTANEOUS | Status: DC
  Administered 2018-05-17: 110 mg via SUBCUTANEOUS
  Filled 2018-05-17: qty 0.8

## 2018-05-17 MED ORDER — LEVOTHYROXINE SODIUM 50 MCG PO TABS: 50.0000 ug | ORAL_TABLET | Freq: Every day | ORAL | 0 refills | Status: DC

## 2018-05-17 MED ORDER — ALENDRONATE SODIUM 70 MG PO TABS: 70.0000 mg | ORAL_TABLET | ORAL | 3 refills | Status: DC

## 2018-05-17 MED ORDER — LORAZEPAM 2 MG/ML IJ SOLN
INTRAMUSCULAR | Status: AC
  Filled 2018-05-17: qty 1

## 2018-05-17 MED ORDER — SODIUM CHLORIDE 0.9 % IV BOLUS
1000.0000 mL | Freq: Once | INTRAVENOUS | Status: AC
  Administered 2018-05-17: 1000 mL via INTRAVENOUS

## 2018-05-17 NOTE — Progress Notes (Signed)
BP (!) 78/62   Pulse 73   Temp 97.6 F (36.4 C) (Oral)   SpO2 98%    Subjective:    Patient ID: Tasha Clarke, female    DOB: May 12, 1951, 67 y.o.   MRN: [id number]  HPI: Tasha Clarke is a 67 y.o. female presenting on 05/17/2018 for COPD (2 month follow up); Depression (Patient states that it is worse); Dizziness ("light headed" x 4 days on and off); and Nausea   HPI Patient comes in today complaining of dizziness and lightheadedness and just not feeling well.  She also complains of nausea and says that this is been increasing over the past 4 days.  She says today she just does not feel well.  She denies any increase in cough or wheezing or abdominal pain or back pain or urinary burning complaints today in the office.  Although when she was leaving with the EMTs she did states she was having some left lower abdominal pain.  She is normally on oxygen for COPD at 2 L nasal cannula and she is still on that here in the office today.  She says she just generally feels weak and ill and just not right.  She denies any fevers or chills.  Her oxygen saturation on 2 L nasal cannula is 98% here in the office.  She cannot pinpoint why she feels ill over the past 4 days but just that she is not feeling well and that things are getting worse.  She does not have known seizure disorder but denies having any seizures recently and her husband is here with her today and he denies her having any seizures recently.  She does admit to not taking her medications yet this morning but she usually takes them a little bit later in the morning anyways.  Relevant past medical, surgical, family and social history reviewed and updated as indicated. Interim medical history since our last visit reviewed. Allergies and medications reviewed and updated.  Review of Systems  Constitutional: Negative for chills and fever.  HENT: Negative for congestion, ear discharge, ear pain, rhinorrhea, sinus pressure and  sinus pain.   Eyes: Negative for visual disturbance.  Respiratory: Negative for chest tightness, shortness of breath and wheezing.   Cardiovascular: Negative for chest pain and leg swelling.  Gastrointestinal: Positive for abdominal pain and nausea. Negative for blood in stool, constipation, diarrhea and vomiting.  Genitourinary: Negative for difficulty urinating, dysuria, flank pain, frequency and hematuria.  Musculoskeletal: Negative for back pain and gait problem.  Skin: Negative for rash.  Neurological: Negative for dizziness, light-headedness and headaches.  Psychiatric/Behavioral: Negative for agitation and behavioral problems.  All other systems reviewed and are negative.   Per HPI unless specifically indicated above   Allergies as of 05/17/2018      Reactions   Cephalexin Hives   Ketorolac Tromethamine Hives   Lac Bovis Nausea And Vomiting   Imdur  [isosorbide Dinitrate]    Iodinated Diagnostic Agents    Other Other (See Comments)   Alka seltzer causes blurred vision, fainting   Sodium Bicarbonate-citric Acid    Milk-related Compounds Nausea And Vomiting      Medication List        Accurate as of 05/17/18  8:55 AM. Always use your most recent med list.          alendronate 70 MG tablet Commonly known as:  FOSAMAX Take 1 tablet (70 mg total) by mouth every Monday. Take with a full glass of water  on an empty stomach. Start taking on:  05/22/2018   aspirin EC 81 MG tablet Take 81 mg by mouth daily.   atorvastatin 40 MG tablet Commonly known as:  LIPITOR Take 40 mg by mouth daily.   butalbital-acetaminophen-caffeine 50-325-40 MG tablet Commonly known as:  FIORICET, ESGIC Take by mouth 2 (two) times daily as needed for headache or migraine.   BUTRANS 10 MCG/HR Ptwk patch Generic drug:  buprenorphine Place 10 mcg onto the skin once a week.   cetirizine 10 MG tablet Commonly known as:  ZYRTEC TAKE 1 TABLET BY MOUTH ONCE DAILY   cyclobenzaprine 10 MG  tablet Commonly known as:  FLEXERIL Take 1 tablet (10 mg total) by mouth 2 (two) times daily as needed for muscle spasms.   DULoxetine 60 MG capsule Commonly known as:  CYMBALTA Take 1 capsule (60 mg total) by mouth daily.   EPINEPHrine 0.3 mg/0.3 mL Soaj injection Commonly known as:  EPI-PEN Inject 0.3 mg into the muscle as needed.   Fish Oil 1000 MG Caps Take 1,000 mg by mouth daily.   fluticasone 50 MCG/ACT nasal spray Commonly known as:  FLONASE Place 2 sprays into both nostrils daily.   fluticasone furoate-vilanterol 200-25 MCG/INH Aepb Commonly known as:  BREO ELLIPTA Inhale 1 puff into the lungs daily.   Fluticasone-Salmeterol 250-50 MCG/DOSE Aepb Commonly known as:  ADVAIR Inhale 1 puff into the lungs 2 (two) times daily.   folic acid 1 MG tablet Commonly known as:  FOLVITE Take 1 mg by mouth daily.   gabapentin 600 MG tablet Commonly known as:  NEURONTIN Take 1 tablet (600 mg total) by mouth 4 (four) times daily.   ibuprofen 200 MG tablet Commonly known as:  ADVIL,MOTRIN Take 200 mg by mouth every 6 (six) hours as needed.   ipratropium-albuterol 0.5-2.5 (3) MG/3ML Soln Commonly known as:  DUONEB Take 3 mLs by nebulization every 6 (six) hours as needed (shortness of breathe).   Iron 325 (65 Fe) MG Tabs Take 1 tablet (325 mg total) by mouth daily.   levETIRAcetam 750 MG tablet Commonly known as:  KEPPRA Take 1 tablet (750 mg total) by mouth daily. Take 750 in a.m. and 1000 in p.m.   levETIRAcetam 1000 MG tablet Commonly known as:  KEPPRA TAKE 1 TABLET BY MOUTH AT BEDTIME   levothyroxine 50 MCG tablet Commonly known as:  SYNTHROID, LEVOTHROID Take 50 mcg by mouth daily before breakfast.   losartan 100 MG tablet Commonly known as:  COZAAR Take 0.5 tablets (50 mg total) by mouth daily.   Magnesium 400 MG Caps Take 400 mg by mouth daily.   Magnesium Gluconate 550 MG Tabs Take 1 tablet (550 mg total) by mouth daily.   Melatonin 10 MG Tabs Take by  mouth.   metoprolol tartrate 25 MG tablet Commonly known as:  LOPRESSOR Take 25 mg by mouth 2 (two) times daily.   multivitamin tablet Take 1 tablet by mouth daily.   NARCAN 4 MG/0.1ML Liqd nasal spray kit Generic drug:  naloxone Place into the nose.   PROVENTIL HFA 108 (90 Base) MCG/ACT inhaler Generic drug:  albuterol Inhale into the lungs.   TYLENOL PM EXTRA STRENGTH 25-500 MG Tabs tablet Generic drug:  diphenhydramine-acetaminophen Take 1 tablet by mouth at bedtime as needed (pain).   VITAMIN B-12 PO Take by mouth daily.   Vitamin D-3 125 MCG (5000 UT) Tabs Take 5,000 Units by mouth daily.          Objective:  BP (!) 78/62   Pulse 73   Temp 97.6 F (36.4 C) (Oral)   SpO2 98%   Wt Readings from Last 3 Encounters:  03/28/18 192 lb (87.1 kg)  03/10/18 193 lb 3.2 oz (87.6 kg)  02/28/18 182 lb (82.6 kg)    Physical Exam  Constitutional: She is oriented to person, place, and time. She appears well-developed. She appears listless. She has a sickly appearance. She appears ill.  Eyes: Pupils are equal, round, and reactive to light. Conjunctivae and EOM are normal.  Neck: Neck supple. No thyromegaly present.  Cardiovascular: Normal rate, regular rhythm, normal heart sounds and intact distal pulses.  No murmur heard. Pulmonary/Chest: Effort normal and breath sounds normal. No respiratory distress. She has no wheezes.  Musculoskeletal: Normal range of motion. She exhibits no edema or tenderness.  Lymphadenopathy:    She has no cervical adenopathy.  Neurological: She is oriented to person, place, and time. She appears listless. Coordination normal.  Skin: Skin is warm and dry. No rash noted. She is not diaphoretic.  Psychiatric: She has a normal mood and affect. Her behavior is normal.  Nursing note and vitals reviewed.       Assessment & Plan:   Problem List Items Addressed This Visit      Nervous and Auditory   Seizure disorder ([place]) - Primary    Other  Visit Diagnoses    Tonic clonic seizures ([place])          Patient started out hypotensive at 78/62 and there was some concern about her blood pressure and she just that she is generally feeling ill and not well and felt very sick but could not pinpoint why she felt sick.  She then went into 2 seizures here in the office both lasting less than a minute both tonic-clonic, the second 1 started out as a partial seizure and developed into tonic-clonic.  There is a concern that she may have some kind of urinary tract infection possibly causing sepsis because she has not had seizures and they have been controlled for some time until she started feeling ill today in the office. Follow up plan: Return if symptoms worsen or fail to improve.  Counseling provided for all of the vaccine components No orders of the defined types were placed in this encounter.   Caryl Pina, MD [facility] 05/17/2018, 8:55 AM

## 2018-05-17 NOTE — ED Triage Notes (Signed)
Patient brought in by EMS for complaint of left lower abdominal pain. Per EMS, patient was at PCP office and they advised EMS that patient had 2 seizures at the office. Patient has history of seizures and states she is not taking her seizure medication. Patient also complaining of vomiting and diarrhea x 1 week.

## 2018-05-17 NOTE — Addendum Note (Signed)
Addended by: Karle Plumber on: 05/17/2018 09:39 AM   Modules accepted: Orders

## 2018-05-17 NOTE — ED Notes (Signed)
Pt reported that she has chronic weakness on left side of body due to extensive medical history, and surgical interventions. 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 Reports having neck and back surgeries causing weakness.

## 2018-05-17 NOTE — ED Notes (Signed)
Pt changed LOC. Pt appearing very lethargic. Dr Rogene Houston is present with patient.

## 2018-05-17 NOTE — H&P (Signed)
History and Physical    Tasha Clarke ZHY:[id number] DOB: 1951-04-14 DOA: 05/17/2018  PCP: Dettinger, Fransisca Kaufmann, MD Patient coming from: Home  Chief Complaint: Seizure  HPI: Tasha Clarke is a 67 y.o. female with medical history significant for COPD on 2 L, seizure, chronic pain, diastolic CHF, hypothyroidism, CKD and polypharmacy who presented from the PCP office by EMS out of concern for seizure-like activity.  Patient is somewhat drowsy.  History provided by husband.  Husband states that patient was at the PCP office for his scheduled follow-up appointment when she suddenly started shaking her head side-to-side and then slumped over.  Denies fall or hitting her head.  He states that her blood pressure was 78/62.  He also noted that she was gasping for air.  He stated that she had similar episode here in ED.  He denies foaming or shaking.  Not sure about bowel or bladder accident.  States that she was in her usual state of health prior to this.  States good compliance with Keppra except for her morning dose this morning.  Denies headache, vision change, cold symptoms, chest pain, nausea, vomiting, abdominal pain or dysuria.  Admits some shortness of breath after this incident.  Also reports spells of lower extremity weakness and abdominal pain which is chronic for her.  Also reports chronic diarrhea that he describes as 1-2 loose bowel movements a day.   She lives with her husband.  Ambulate using walker and cane.  Smokes about a pack a day.  Reports occasional alcohol.  Denies recreational drug use.   In ED, initial BP elevated and then she became hypotensive.  She was given 2 L of normal saline boluses.   Magnesium 1.6.  CMP, CBC, lactic acid, UA, troponin, BNP, alcohol level, EKG, CT chest, CT abdomen and chest x-ray not impressive.  Blood cultures obtained.  Hospitalist service was called to admit patient for further management.  I requested EDP to obtain CT head and  consult neurology.  CT head came back negative.  Per EDP, neurology recommended loading with 1 g Keppra. ROS Review of Systems  Constitutional: Negative for fever and weight loss.  HENT: Negative for sore throat.   Eyes: Negative for blurred vision, photophobia and pain.  Respiratory: Positive for shortness of breath. Negative for cough.   Cardiovascular: Negative for chest pain, palpitations and leg swelling.  Gastrointestinal: Positive for abdominal pain and diarrhea. Negative for blood in stool, melena and vomiting.  Genitourinary: Negative for dysuria.  Musculoskeletal: Negative for myalgias.  Skin: Negative for rash.  Neurological: Positive for seizures. Negative for speech change, focal weakness, weakness and headaches.  Endo/Heme/Allergies: Does not bruise/bleed easily.  Psychiatric/Behavioral: Negative for substance abuse. The patient is not nervous/anxious.     PMH Past Medical History:  Diagnosis Date  . Allergy   . Anemia   . Anxiety   . Arthritis   . Asthma   . Blood transfusion without reported diagnosis   . Cataract   . CHF (congestive heart failure) ([place])   . Chronic kidney disease   . Clotting disorder ([place])   . COPD (chronic obstructive pulmonary disease) ([place])   . Depression   . Diabetes mellitus without complication ([place])   . Emphysema of lung ([place])   . GERD (gastroesophageal reflux disease)   . Hyperlipidemia   . Hypertension   . Neuromuscular disorder ([place])   . Osteoporosis   . Oxygen deficiency   . Seizures ([place])   . Skin cancer  2 areas removed more than 10 years ago  . Sleep apnea 1985   wears cpap nightly  . Thyroid disease    PSH Past Surgical History:  Procedure Laterality Date  . APPENDECTOMY    . BIOPSY  03/16/2018   Procedure: BIOPSY;  Surgeon: Daneil Dolin, MD;  [facility]: [facility];  Service: Endoscopy;;  gastric bx's  . ESOPHAGOGASTRODUODENOSCOPY (EGD) WITH PROPOFOL N/A 03/16/2018   Procedure: ESOPHAGOGASTRODUODENOSCOPY (EGD)  WITH PROPOFOL;  Surgeon: Daneil Dolin, MD;  Location: [facility];  Service: Endoscopy;  Laterality: N/A;  3:00pm  . EYE SURGERY    . FRACTURE SURGERY    . heart stents    . JOINT REPLACEMENT    . SPINAL CORD STIMULATOR IMPLANT  2015  . SPINE SURGERY     Fam HX Family History  Problem Relation Age of Onset  . Arthritis Mother   . Asthma Mother   . Cancer Mother        lung  . Depression Mother   . Diabetes Mother   . Arthritis Father   . Asthma Father   . Depression Father   . Asthma Sister   . Depression Sister   . Diabetes Sister   . Arthritis Maternal Grandmother   . Asthma Maternal Grandmother   . Depression Maternal Grandmother   . Arthritis Maternal Grandfather   . Asthma Maternal Grandfather   . Arthritis Paternal Grandmother   . Asthma Paternal Grandmother   . Arthritis Paternal Grandfather   . Asthma Paternal Grandfather   . COPD Paternal Grandfather   . Diabetes Sister   . Mental illness Sister   . Anemia Sister   . Hypertension Son   . Post-traumatic stress disorder Son   . Allergies Daughter   . Breast cancer Neg Hx     Social Hx  reports that she has been smoking cigarettes. She has a 32.25 pack-year smoking history. She has never used smokeless tobacco. She reports that she does not drink alcohol or use drugs.  Allergy Allergies  Allergen Reactions  . Cephalexin Hives  . Ketorolac Tromethamine Hives  . Lac Bovis Nausea And Vomiting  . Imdur  [Isosorbide Dinitrate]   . Iodinated Diagnostic Agents   . Other Other (See Comments)    Alka seltzer causes blurred vision, fainting  . Sodium Bicarbonate-Citric Acid   . Milk-Related Compounds Nausea And Vomiting   Home Meds Prior to Admission medications   Medication Sig Start Date End Date Taking? Authorizing Provider  albuterol (PROVENTIL HFA) 108 (90 Base) MCG/ACT inhaler Inhale into the lungs.   Yes [provider]  alendronate (FOSAMAX) 70 MG tablet Take 1 tablet (70 mg total) by  mouth every Monday. Take with a full glass of water on an empty stomach. 05/22/18  Yes Dettinger, Fransisca Kaufmann, MD  aspirin EC 81 MG tablet Take 81 mg by mouth daily.   Yes [provider]  atorvastatin (LIPITOR) 40 MG tablet Take 40 mg by mouth daily.   Yes [provider]  butalbital-acetaminophen-caffeine (FIORICET, ESGIC) 50-325-40 MG tablet Take by mouth 2 (two) times daily as needed for headache or migraine.   Yes Peter Garter., MD  cetirizine (ZYRTEC) 10 MG tablet TAKE 1 TABLET BY MOUTH ONCE DAILY 03/31/18  Yes Dettinger, Fransisca Kaufmann, MD  Cholecalciferol (VITAMIN D-3) 5000 units TABS Take 5,000 Units by mouth daily.   Yes [provider]  Cyanocobalamin (VITAMIN B-12 PO) Take by mouth daily.   Yes [provider]  cyclobenzaprine (FLEXERIL) 10 MG tablet Take 1 tablet (10 mg total) by mouth 2 (two) times daily as needed for muscle spasms. 01/26/18  Yes Dettinger, Fransisca Kaufmann, MD  diphenhydramine-acetaminophen (TYLENOL PM EXTRA STRENGTH) 25-500 MG TABS tablet Take 1 tablet by mouth at bedtime as needed (pain).    Yes [provider]  DULoxetine (CYMBALTA) 60 MG capsule Take 1 capsule (60 mg total) by mouth daily. 03/16/18  Yes Dettinger, Fransisca Kaufmann, MD  Ferrous Sulfate (IRON) 325 (65 Fe) MG TABS Take 1 tablet (325 mg total) by mouth daily. 08/27/17  Yes Velvet Bathe, MD  fluticasone (FLONASE) 50 MCG/ACT nasal spray Place 2 sprays into both nostrils daily. 05/17/18  Yes Dettinger, Fransisca Kaufmann, MD  fluticasone furoate-vilanterol (BREO ELLIPTA) 200-25 MCG/INH AEPB Inhale 1 puff into the lungs daily. 08/31/17  Yes Dettinger, Fransisca Kaufmann, MD  Fluticasone-Salmeterol (ADVAIR) 250-50 MCG/DOSE AEPB Inhale 1 puff into the lungs 2 (two) times daily. 05/17/18  Yes Dettinger, Fransisca Kaufmann, MD  folic acid (FOLVITE) 1 MG tablet Take 1 mg by mouth daily.   Yes [provider]  gabapentin (NEURONTIN) 600 MG tablet Take 1 tablet (600 mg total) by mouth 4 (four) times daily. 10/26/17   Yes Dettinger, Fransisca Kaufmann, MD  ibuprofen (ADVIL,MOTRIN) 200 MG tablet Take 200 mg by mouth every 6 (six) hours as needed.   Yes [provider]  ipratropium-albuterol (DUONEB) 0.5-2.5 (3) MG/3ML SOLN Take 3 mLs by nebulization every 6 (six) hours as needed (shortness of breathe).    Yes [provider]  levETIRAcetam (KEPPRA) 1000 MG tablet TAKE 1 TABLET BY MOUTH AT BEDTIME 05/01/18  Yes Dettinger, Fransisca Kaufmann, MD  levETIRAcetam (KEPPRA) 750 MG tablet Take 1 tablet (750 mg total) by mouth daily. Take 750 in a.m. and 1000 in p.m. 02/13/18  Yes Dettinger, Fransisca Kaufmann, MD  levothyroxine (SYNTHROID, LEVOTHROID) 50 MCG tablet Take 1 tablet (50 mcg total) by mouth daily before breakfast. 05/17/18  Yes Dettinger, Fransisca Kaufmann, MD  losartan (COZAAR) 100 MG tablet Take 0.5 tablets (50 mg total) by mouth daily. 05/17/18  Yes Dettinger, Fransisca Kaufmann, MD  Magnesium 400 MG CAPS Take 400 mg by mouth daily.   Yes [provider]  Melatonin 10 MG TABS Take by mouth.   Yes [provider]  metoprolol tartrate (LOPRESSOR) 25 MG tablet Take 25 mg by mouth 2 (two) times daily.   Yes [provider]  Multiple Vitamin (MULTIVITAMIN) tablet Take 1 tablet by mouth daily.   Yes [provider]  Omega-3 Fatty Acids (FISH OIL) 1000 MG CAPS Take 1,000 mg by mouth daily.   Yes [provider]  buprenorphine (BUTRANS) 10 MCG/HR PTWK patch Place 10 mcg onto the skin once a week.    Peter Garter., MD  EPINEPHrine 0.3 mg/0.3 mL IJ SOAJ injection Inject 0.3 mg into the muscle as needed.    [provider]  naloxone [facility]) nasal spray 4 mg/0.1 mL Place into the nose. 08/18/17   [provider]  potassium chloride (K-DUR) 10 MEQ tablet Take 1 tablet by mouth daily.    [provider]    Physical Exam: Vitals:   05/17/18 1830 05/17/18 1930 05/17/18 2017 05/17/18 2030  BP: (!) 113/47 (!) 95/51 (!) 93/41 (!) 109/43  Pulse: 71 69 66 69  Resp: 16 18 16 17     Temp:   98.9 F (37.2 C)   TempSrc:   Oral   SpO2: 100% 97% 97% 99%  Weight:  Height:        Constitutional: Drowsy but arises easily responds to questions fairly.  Follows commands. Eyes: Pupils about 5 mm, round and reactive to light HENMT: atraumatic. Normocephalic. Hearing grossly intact. No rhinorrhea. MMM.  Wears dentures.  No signs of tongue bite Neck: normal. Supple. No mass. No thyromegaly.  No nuchal rigidity. Resp: normal effort.  Fair air movement bilaterally.  No wheeze or crackles. CVS: RRR. S1 & S2 heard. No murmurs. No edema. GI: normal bowel sound. No tenderness. No distention. MSK: No obvious deformity. No focal tenderness. Moving extremities. Skin: no apparent lesion. Normal warmth.  Neuro: Awake. Alert. Oriented to self, place, month and year but not date.. CN 2-12 grossly intact. Light sensation intact.  Motor 3/5 in left lower extremity and 4/5 in right upper extremity.  Chronic left-sided weakness per husband. Psych: Calm and sleepy.  Labs on Admission: I have personally reviewed following labs and imaging studies  CBC: Recent Labs  Lab 05/17/18 1053  WBC 9.8  NEUTROABS 6.1  HGB 11.1*  HCT 36.9  MCV 96.9  PLT 811   Basic Metabolic Panel: Recent Labs  Lab 05/17/18 1053  NA 135  K 4.1  CL 104  CO2 23  GLUCOSE 113*  BUN 29*  CREATININE 1.10*  CALCIUM 9.0  MG 1.6*   GFR: Estimated Creatinine Clearance: 53.1 mL/min (A) (by C-G formula based on SCr of 1.1 mg/dL (H)). Liver Function Tests: Recent Labs  Lab 05/17/18 1053  AST 19  ALT 17  ALKPHOS 70  BILITOT 0.5  PROT 7.2  ALBUMIN 3.9   Recent Labs  Lab 05/17/18 1053  LIPASE 29   No results for input(s): AMMONIA in the last 168 hours. Coagulation Profile: No results for input(s): INR, PROTIME in the last 168 hours. Cardiac Enzymes: Recent Labs  Lab 05/17/18 1053  TROPONINI <0.03   BNP (last 3 results) No results for input(s): PROBNP in the last 8760 hours. HbA1C: No  results for input(s): HGBA1C in the last 72 hours. CBG: Recent Labs  Lab 05/17/18 1046  GLUCAP 102*   Lipid Profile: No results for input(s): CHOL, HDL, LDLCALC, TRIG, CHOLHDL, LDLDIRECT in the last 72 hours. Thyroid Function Tests: No results for input(s): TSH, T4TOTAL, FREET4, T3FREE, THYROIDAB in the last 72 hours. Anemia Panel: No results for input(s): VITAMINB12, FOLATE, FERRITIN, TIBC, IRON, RETICCTPCT in the last 72 hours. Urine analysis:    Component Value Date/Time   COLORURINE YELLOW 05/17/2018 [place] 05/17/2018 1053   LABSPEC 1.011 05/17/2018 1053   PHURINE 5.0 05/17/2018 [place] 05/17/2018 1053   HGBUR NEGATIVE 05/17/2018 1053   BILIRUBINUR NEGATIVE 05/17/2018 1053   KETONESUR NEGATIVE 05/17/2018 1053   PROTEINUR NEGATIVE 05/17/2018 1053   NITRITE NEGATIVE 05/17/2018 1053   LEUKOCYTESUR NEGATIVE 05/17/2018 1053    Sepsis Labs: Lactic acid negative. ) Recent Results (from the past 240 hour(s))  Culture, blood (Routine X 2) w Reflex to ID Panel     Status: None (Preliminary result)   Collection Time: 05/17/18  3:55 PM  Result Value Ref Range Status   Specimen Description RIGHT ANTECUBITAL  Final   Special Requests   Final    BOTTLES DRAWN AEROBIC ONLY Blood Culture adequate volume Performed at [facility], [address] [place], [place] [address]    Culture PENDING  Incomplete   Report Status PENDING  Incomplete  Culture, blood (Routine X 2) w Reflex to ID Panel     Status:  None (Preliminary result)   Collection Time: 05/17/18  4:14 PM  Result Value Ref Range Status   Specimen Description BLOOD LEFT FOREARM  Final   Special Requests   Final    BOTTLES DRAWN AEROBIC ONLY Blood Culture adequate volume Performed at [facility], [address], [place], [place] [address]    Culture PENDING  Incomplete   Report Status PENDING  Incomplete     Radiological Exams on Admission: Ct Abdomen Pelvis Wo Contrast  Result Date:  05/17/2018 CLINICAL DATA:  Vomiting, diarrhea and left lower quadrant abdominal pain for 1 week. EXAM: CT CHEST, ABDOMEN AND PELVIS WITHOUT CONTRAST TECHNIQUE: Multidetector CT imaging of the chest, abdomen and pelvis was performed following the standard protocol without IV contrast. COMPARISON:  CT chest 10/28/2017. CT abdomen and pelvis 01/17/2018. Single-view of the chest earlier today. PA and lateral chest 08/25/2017. FINDINGS: CT CHEST FINDINGS Cardiovascular: Heart size is upper normal. Calcific aortic and coronary atherosclerosis is identified. No pericardial effusion. Mediastinum/Nodes: The walls of the distal esophagus appear mildly thickened but unchanged compared the prior chest CT. Note is made that the esophagus appeared normal on endoscopy 03/16/2018. The left lobe of the thyroid is mildly enlarged with some coarse calcifications present. No lymphadenopathy. Lungs/Pleura: Mild dependent atelectasis is present. Mild ground-glass attenuation is seen in the upper lobes bilaterally, unchanged. Mild dependent atelectasis noted. Musculoskeletal: No acute or focal abnormality. Spinal stimulator is in place. CT ABDOMEN PELVIS FINDINGS Hepatobiliary: No focal liver abnormality is seen. Status post cholecystectomy. No biliary dilatation. Pancreas: Unremarkable. No pancreatic ductal dilatation or surrounding inflammatory changes. Spleen: Normal in size without focal abnormality. Adrenals/Urinary Tract: Mild thickening of the left adrenal gland compatible with hyperplasia is unchanged. Tiny right adrenal adenoma is also unchanged. The kidneys, ureters and urinary bladder appear normal. Punctate calcification projecting in the right renal hilum is likely vascular in origin. Stomach/Bowel: Stomach is within normal limits. The patient is status post appendectomy. No evidence of bowel wall thickening, distention, or inflammatory changes. Vascular/Lymphatic: Aortic atherosclerosis. No enlarged abdominal or pelvic lymph  nodes. Reproductive: Status post hysterectomy. No adnexal masses. Other: None. Musculoskeletal: No acute or focal abnormality. The patient is status post L3-4 laminectomy. IMPRESSION: No acute abnormality or finding to explain the patient's symptoms in the chest, abdomen or pelvis. Extensive calcific aortic and coronary atherosclerosis. Status post appendectomy, cholecystectomy and hysterectomy. Electronically Signed   By: Inge Rise M.D.   On: 05/17/2018 15:44   Ct Head Wo Contrast  Result Date: 05/17/2018 CLINICAL DATA:  Altered level of consciousness.  Seizure. EXAM: CT HEAD WITHOUT CONTRAST TECHNIQUE: Contiguous axial images were obtained from the base of the skull through the vertex without intravenous contrast. COMPARISON:  CT head 01/27/2018 FINDINGS: Brain: Mild frontal atrophy. Ventricle size normal. Negative for infarct, hemorrhage, mass Vascular: Negative for hyperdense vessel Skull: Negative Sinuses/Orbits: Benign osteoma right frontal sinus unchanged. Remaining sinuses clear. Bilateral cataract surgery Other: None IMPRESSION: No acute abnormality. Electronically Signed   By: Franchot Gallo M.D.   On: 05/17/2018 17:36   Ct Chest Wo Contrast  Result Date: 05/17/2018 CLINICAL DATA:  Vomiting, diarrhea and left lower quadrant abdominal pain for 1 week. EXAM: CT CHEST, ABDOMEN AND PELVIS WITHOUT CONTRAST TECHNIQUE: Multidetector CT imaging of the chest, abdomen and pelvis was performed following the standard protocol without IV contrast. COMPARISON:  CT chest 10/28/2017. CT abdomen and pelvis 01/17/2018. Single-view of the chest earlier today. PA and lateral chest 08/25/2017. FINDINGS: CT CHEST FINDINGS Cardiovascular: Heart size is upper  normal. Calcific aortic and coronary atherosclerosis is identified. No pericardial effusion. Mediastinum/Nodes: The walls of the distal esophagus appear mildly thickened but unchanged compared the prior chest CT. Note is made that the esophagus appeared normal  on endoscopy 03/16/2018. The left lobe of the thyroid is mildly enlarged with some coarse calcifications present. No lymphadenopathy. Lungs/Pleura: Mild dependent atelectasis is present. Mild ground-glass attenuation is seen in the upper lobes bilaterally, unchanged. Mild dependent atelectasis noted. Musculoskeletal: No acute or focal abnormality. Spinal stimulator is in place. CT ABDOMEN PELVIS FINDINGS Hepatobiliary: No focal liver abnormality is seen. Status post cholecystectomy. No biliary dilatation. Pancreas: Unremarkable. No pancreatic ductal dilatation or surrounding inflammatory changes. Spleen: Normal in size without focal abnormality. Adrenals/Urinary Tract: Mild thickening of the left adrenal gland compatible with hyperplasia is unchanged. Tiny right adrenal adenoma is also unchanged. The kidneys, ureters and urinary bladder appear normal. Punctate calcification projecting in the right renal hilum is likely vascular in origin. Stomach/Bowel: Stomach is within normal limits. The patient is status post appendectomy. No evidence of bowel wall thickening, distention, or inflammatory changes. Vascular/Lymphatic: Aortic atherosclerosis. No enlarged abdominal or pelvic lymph nodes. Reproductive: Status post hysterectomy. No adnexal masses. Other: None. Musculoskeletal: No acute or focal abnormality. The patient is status post L3-4 laminectomy. IMPRESSION: No acute abnormality or finding to explain the patient's symptoms in the chest, abdomen or pelvis. Extensive calcific aortic and coronary atherosclerosis. Status post appendectomy, cholecystectomy and hysterectomy. Electronically Signed   By: Inge Rise M.D.   On: 05/17/2018 15:44   Dg Chest Port 1 View  Result Date: 05/17/2018 CLINICAL DATA:  Abdominal pain with hypotension. EXAM: PORTABLE CHEST 1 VIEW COMPARISON:  01/30/2018. FINDINGS: Interval removal of right IJ line. Cardiomegaly with mild bilateral from interstitial prominence suggesting CHF.  No pleural effusion or pneumothorax. Prior cervicothoracic spine fusion. IMPRESSION: 1.  Interval removal of right IJ line. 2. Cardiomegaly with mild bilateral pulmonary interstitial prominence suggesting CHF. Electronically Signed   By: Marcello Moores  Register   On: 05/17/2018 11:56    All images have been reviewed by me personally.   EKG: Independently reviewed.  Normal sinus rhythm without acute ischemic finding.  Assessment/Plan Active Problems:   COPD (chronic obstructive pulmonary disease) (HCC)   Chronic pain syndrome   Hypothyroid   Hypomagnesemia   Tobacco abuse disorder   Essential hypertension   Diarrhea   Seizure-like activity ([place])   Seizure-like activity: Patient with history of seizure.  Appears postictal.  Status post Keppra 1000 mg daily.  CT head without acute finding.  No signs and symptoms of infectious process.  She was also hypotensive to 70s over 60s at PCP office per patient's husband.  Discussed with Dr. Cheral Marker (neurology at [facility]) who thinks patient can be managed at any pain with neurology guidance over the phone. -Continue IV Keppra 1000 mg twice daily.  Can switch to home oral Keppra after SLP -Seizure precaution -No need of EEG for patient with known seizure history per Dr. Cheral Marker -Bedside stroke swallow test -SLP/PT/OT  Left lower quadrant tenderness/diarrhea: CT abdomen not impressive -C. difficile PCR if she continues to have frequent bowel movement here  Hypomagnesemia: -Recheck and replete in the morning  Dyspnea: Unclear etiology.  No findings to suggest COPD or CHF exacerbation.  She was hypotensive at PCP office  -On home oxygen right now.  COPD: On 2 L by nasal cannula.  Stable. -Continue home meds and oxygen.  Diastolic CHF: Stable.  No signs of fluid overload.  Echo in 08/2017  with EF of 60 to 65% without significant structural functional abnormalities. -Monitor fluid status given history of CHF.  Tobacco use disorder: reports smoking a pack a  day.  Not interested in nicotine patch now -Offer nicotine patch as needed  Other chronic comorbidities including hypertension, hypothyroidism, hyperlipidemia, CKD and depression is stable. -Resume home meds after bedside stroke swallow test.  DVT prophylaxis: Lovenox Code Status: Full code Family Communication: Husband at bedside Disposition Plan: Admit to telemetry-neuro bed Consults called: Neurology over the phone Admission status: Observation.  Anticipate discharge in the next 24 hours if she remains stable.   Mercy Riding MD [facility] Pager [phone]  If 7PM-7AM, please contact night-coverage [email or web address] Password [email or web address]  05/17/2018, 9:02 PM

## 2018-05-17 NOTE — ED Provider Notes (Addendum)
[facility] EMERGENCY DEPARTMENT Provider Note   CSN: [id number] Arrival date & time: 05/17/18  9381     History   Chief Complaint Chief Complaint  Patient presents with  . Abdominal Pain    HPI Tasha Clarke is a 67 y.o. female.  Patient brought in by EMS for left lower quadrant abdominal pain.  According to EMS patient was at primary care office and they advised that patient had 2 seizures in the office while sitting in a wheelchair.  Patient has a history of seizures and states she is not taking her seizure medicines.  However patient told me she took him yesterday but did not take them today.  Patient complaining of nausea vomiting and diarrhea for a week.  Patient supposedly has chronic left-sided weakness due to back surgery and neck surgery.  That is not new here today.  Shortly after arrival here I was called in to evaluate her because nurses felt she was lethargic.  But she would wake up and follow commands for me.  Initial blood pressures here were low 90s to little below 90.  Then they came up into the 100s.  Patient did receive 1 L of fluid.  Patient without a fever not tachycardic.  Patient's husband witnessed the the seizure said that each 1 lasted about 2 minutes.  No incontinence she did not bite her tongue.     Past Medical History:  Diagnosis Date  . Allergy   . Anemia   . Anxiety   . Arthritis   . Asthma   . Blood transfusion without reported diagnosis   . Cataract   . CHF (congestive heart failure) ([place])   . Chronic kidney disease   . Clotting disorder ([place])   . COPD (chronic obstructive pulmonary disease) ([place])   . Depression   . Diabetes mellitus without complication ([place])   . Emphysema of lung ([place])   . GERD (gastroesophageal reflux disease)   . Hyperlipidemia   . Hypertension   . Neuromuscular disorder ([place])   . Osteoporosis   . Oxygen deficiency   . Seizures ([place])   . Skin cancer    2 areas removed more than 10 years ago  . Sleep  apnea 1985   wears cpap nightly  . Thyroid disease     Patient Active Problem List   Diagnosis Date Noted  . Abdominal pain 03/10/2018  . Abnormal CT of the abdomen 03/10/2018  . Black tarry stools 03/10/2018  . Diarrhea 03/10/2018  . Hypomagnesemia 01/31/2018  . Tobacco abuse disorder 01/31/2018  . Essential hypertension 01/31/2018  . MRSA (methicillin resistant staphylococcus aureus) pneumonia ([place]) 01/31/2018  . Hypokalemia 01/17/2018  . Seizure ([place]) 01/17/2018  . Gastroenteritis 01/17/2018  . Severe sepsis with septic shock ([place]) 10/28/2017  . Hypotension   . Iron deficiency anemia 08/25/2017  . COPD (chronic obstructive pulmonary disease) ([place]) 08/24/2017  . Hyperlipidemia LDL goal <100 08/24/2017  . Seizure disorder ([place]) 08/24/2017  . Depression, recurrent ([place]) 08/24/2017  . Osteoporosis 08/24/2017  . Chronic pain syndrome 08/24/2017  . Hypothyroid 08/24/2017    Past Surgical History:  Procedure Laterality Date  . APPENDECTOMY    . BIOPSY  03/16/2018   Procedure: BIOPSY;  Surgeon: Daneil Dolin, MD;  Location: [facility] ENDO SUITE;  Service: Endoscopy;;  gastric bx's  . ESOPHAGOGASTRODUODENOSCOPY (EGD) WITH PROPOFOL N/A 03/16/2018   Procedure: ESOPHAGOGASTRODUODENOSCOPY (EGD) WITH PROPOFOL;  Surgeon: Daneil Dolin, MD;  Location: [facility] ENDO SUITE;  Service: Endoscopy;  Laterality: N/A;  3:00pm  . EYE SURGERY    . FRACTURE SURGERY    . heart stents    . JOINT REPLACEMENT    . SPINAL CORD STIMULATOR IMPLANT  2015  . SPINE SURGERY       OB History   None      Home Medications    Prior to Admission medications   Medication Sig Start Date End Date Taking? Authorizing Provider  albuterol (PROVENTIL HFA) 108 (90 Base) MCG/ACT inhaler Inhale into the lungs.   Yes [provider]  alendronate (FOSAMAX) 70 MG tablet Take 1 tablet (70 mg total) by mouth every Monday. Take with a full glass of water on an empty stomach. 05/22/18  Yes Dettinger, Fransisca Kaufmann, MD    aspirin EC 81 MG tablet Take 81 mg by mouth daily.   Yes [provider]  atorvastatin (LIPITOR) 40 MG tablet Take 40 mg by mouth daily.   Yes [provider]  butalbital-acetaminophen-caffeine (FIORICET, ESGIC) 50-325-40 MG tablet Take by mouth 2 (two) times daily as needed for headache or migraine.   Yes Peter Garter., MD  cetirizine (ZYRTEC) 10 MG tablet TAKE 1 TABLET BY MOUTH ONCE DAILY 03/31/18  Yes Dettinger, Fransisca Kaufmann, MD  Cholecalciferol (VITAMIN D-3) 5000 units TABS Take 5,000 Units by mouth daily.   Yes [provider]  Cyanocobalamin (VITAMIN B-12 PO) Take by mouth daily.   Yes [provider]  cyclobenzaprine (FLEXERIL) 10 MG tablet Take 1 tablet (10 mg total) by mouth 2 (two) times daily as needed for muscle spasms. 01/26/18  Yes Dettinger, Fransisca Kaufmann, MD  diphenhydramine-acetaminophen (TYLENOL PM EXTRA STRENGTH) 25-500 MG TABS tablet Take 1 tablet by mouth at bedtime as needed (pain).    Yes [provider]  DULoxetine (CYMBALTA) 60 MG capsule Take 1 capsule (60 mg total) by mouth daily. 03/16/18  Yes Dettinger, Fransisca Kaufmann, MD  Ferrous Sulfate (IRON) 325 (65 Fe) MG TABS Take 1 tablet (325 mg total) by mouth daily. 08/27/17  Yes Velvet Bathe, MD  fluticasone (FLONASE) 50 MCG/ACT nasal spray Place 2 sprays into both nostrils daily. 05/17/18  Yes Dettinger, Fransisca Kaufmann, MD  fluticasone furoate-vilanterol (BREO ELLIPTA) 200-25 MCG/INH AEPB Inhale 1 puff into the lungs daily. 08/31/17  Yes Dettinger, Fransisca Kaufmann, MD  Fluticasone-Salmeterol (ADVAIR) 250-50 MCG/DOSE AEPB Inhale 1 puff into the lungs 2 (two) times daily. 05/17/18  Yes Dettinger, Fransisca Kaufmann, MD  folic acid (FOLVITE) 1 MG tablet Take 1 mg by mouth daily.   Yes [provider]  gabapentin (NEURONTIN) 600 MG tablet Take 1 tablet (600 mg total) by mouth 4 (four) times daily. 10/26/17  Yes Dettinger, Fransisca Kaufmann, MD  ibuprofen (ADVIL,MOTRIN) 200 MG tablet Take 200 mg by mouth every 6 (six) hours as  needed.   Yes [provider]  ipratropium-albuterol (DUONEB) 0.5-2.5 (3) MG/3ML SOLN Take 3 mLs by nebulization every 6 (six) hours as needed (shortness of breathe).    Yes [provider]  levETIRAcetam (KEPPRA) 1000 MG tablet TAKE 1 TABLET BY MOUTH AT BEDTIME 05/01/18  Yes Dettinger, Fransisca Kaufmann, MD  levETIRAcetam (KEPPRA) 750 MG tablet Take 1 tablet (750 mg total) by mouth daily. Take 750 in a.m. and 1000 in p.m. 02/13/18  Yes Dettinger, Fransisca Kaufmann, MD  levothyroxine (SYNTHROID, LEVOTHROID) 50 MCG tablet Take 1 tablet (50 mcg total) by mouth daily before breakfast. 05/17/18  Yes Dettinger, Fransisca Kaufmann, MD  losartan (COZAAR) 100 MG tablet Take 0.5 tablets (50 mg total) by mouth daily.  05/17/18  Yes Dettinger, Fransisca Kaufmann, MD  Magnesium 400 MG CAPS Take 400 mg by mouth daily.   Yes [provider]  Melatonin 10 MG TABS Take by mouth.   Yes [provider]  metoprolol tartrate (LOPRESSOR) 25 MG tablet Take 25 mg by mouth 2 (two) times daily.   Yes [provider]  Multiple Vitamin (MULTIVITAMIN) tablet Take 1 tablet by mouth daily.   Yes [provider]  Omega-3 Fatty Acids (FISH OIL) 1000 MG CAPS Take 1,000 mg by mouth daily.   Yes [provider]  buprenorphine (BUTRANS) 10 MCG/HR PTWK patch Place 10 mcg onto the skin once a week.    Peter Garter., MD  EPINEPHrine 0.3 mg/0.3 mL IJ SOAJ injection Inject 0.3 mg into the muscle as needed.    [provider]  naloxone [facility]) nasal spray 4 mg/0.1 mL Place into the nose. 08/18/17   [provider]  potassium chloride (K-DUR) 10 MEQ tablet Take 1 tablet by mouth daily.    [provider]    Family History Family History  Problem Relation Age of Onset  . Arthritis Mother   . Asthma Mother   . Cancer Mother        lung  . Depression Mother   . Diabetes Mother   . Arthritis Father   . Asthma Father   . Depression Father   . Asthma Sister   . Depression Sister   .  Diabetes Sister   . Arthritis Maternal Grandmother   . Asthma Maternal Grandmother   . Depression Maternal Grandmother   . Arthritis Maternal Grandfather   . Asthma Maternal Grandfather   . Arthritis Paternal Grandmother   . Asthma Paternal Grandmother   . Arthritis Paternal Grandfather   . Asthma Paternal Grandfather   . COPD Paternal Grandfather   . Diabetes Sister   . Mental illness Sister   . Anemia Sister   . Hypertension Son   . Post-traumatic stress disorder Son   . Allergies Daughter   . Breast cancer Neg Hx     Social History Social History   Tobacco Use  . Smoking status: Current Every Day Smoker    Packs/day: 0.75    Years: 43.00    Pack years: 32.25    Types: Cigarettes  . Smokeless tobacco: Never Used  Substance Use Topics  . Alcohol use: No    Frequency: Never  . Drug use: No     Allergies   Cephalexin; Ketorolac tromethamine; [place] bov[place]; Imdur  [isosorbide dinitrate]; Iodinated diagnostic agents; Other; Sodium bicarbonate-citric acid; and Milk-related compounds   Review of Systems Review of Systems  Constitutional: Positive for fatigue. Negative for fever.  HENT: Negative for congestion.   Eyes: Negative for visual disturbance.  Respiratory: Negative for shortness of breath.   Cardiovascular: Negative for chest pain.  Gastrointestinal: Positive for abdominal pain, diarrhea, nausea and vomiting. Negative for blood in stool.  Genitourinary: Negative for dysuria.  Musculoskeletal: Positive for back pain.  Skin: Negative for rash.  Neurological: Positive for seizures and weakness. Negative for headaches.  Hematological: Does not bruise/bleed easily.  Psychiatric/Behavioral: Negative for confusion.     Physical Exam Updated Vital Signs BP (!) 82/42 (BP Location: Left Arm)   Pulse 66   Temp 98.3 F (36.8 C) (Oral)   Resp 12   Ht 1.651 m (5\' 5" )   Wt 81.6 kg   SpO2 100%   BMI 29.95 kg/m   Physical Exam  Constitutional:  She appears  well-developed and well-nourished. She appears distressed.  HENT:  Head: Normocephalic and atraumatic.  Mouth/Throat: Oropharynx is clear and moist.  Eyes: Pupils are equal, round, and reactive to light. Conjunctivae and EOM are normal.  Neck: Neck supple.  Cardiovascular: Normal rate, regular rhythm and normal heart sounds.  Pulmonary/Chest: Effort normal and breath sounds normal. No respiratory distress. She has no wheezes. She has no rales.  Abdominal: Soft. Bowel sounds are normal.  Left lower quadrant abdominal tenderness.  Musculoskeletal: Normal range of motion. She exhibits no edema.  Neurological: She is alert. No cranial nerve deficit or sensory deficit. She exhibits normal muscle tone. Coordination normal.  There is some chronic weakness to the left side left lower extremity greater than left arm.  Skin: Skin is warm. No rash noted.  Nursing note and vitals reviewed.    ED Treatments / Results  Labs (all labs ordered are listed, but only abnormal results are displayed) Labs Reviewed  COMPREHENSIVE METABOLIC PANEL - Abnormal; Notable for the following components:      Result Value   Glucose, Bld 113 (*)    BUN 29 (*)    Creatinine, Ser 1.10 (*)    GFR calc non Af Amer 51 (*)    GFR calc Af Amer 59 (*)    All other components within normal limits  CBC WITH DIFFERENTIAL/PLATELET - Abnormal; Notable for the following components:   RBC 3.81 (*)    Hemoglobin 11.1 (*)    Abs Immature Granulocytes 0.10 (*)    All other components within normal limits  RAPID URINE DRUG SCREEN, HOSP PERFORMED - Abnormal; Notable for the following components:   Barbiturates POSITIVE (*)    All other components within normal limits  MAGNESIUM - Abnormal; Notable for the following components:   Magnesium 1.6 (*)    All other components within normal limits  CBG MONITORING, ED - Abnormal; Notable for the following components:   Glucose-Capillary 102 (*)    All other components within normal  limits  LIPASE, BLOOD  ETHANOL  URINALYSIS, ROUTINE W REFLEX MICROSCOPIC  BRAIN NATRIURETIC PEPTIDE  TROPONIN I  I-STAT CG4 LACTIC ACID, ED  I-STAT CG4 LACTIC ACID, ED    EKG EKG Interpretation  Date/Time:  Wednesday May 17 2018 09:49:07 EST Ventricular Rate:  68 PR Interval:    QRS Duration: 93 QT Interval:  397 QTC Calculation: 423 R Axis:   41 Text Interpretation:  Sinus rhythm Baseline wander in lead(s) V2 Artifact Confirmed by Fredia Sorrow [phone]) on 05/17/2018 10:07:54 AM   Radiology Dg Chest Port 1 View  Result Date: 05/17/2018 CLINICAL DATA:  Abdominal pain with hypotension. EXAM: PORTABLE CHEST 1 VIEW COMPARISON:  01/30/2018. FINDINGS: Interval removal of right IJ line. Cardiomegaly with mild bilateral from interstitial prominence suggesting CHF. No pleural effusion or pneumothorax. Prior cervicothoracic spine fusion. IMPRESSION: 1.  Interval removal of right IJ line. 2. Cardiomegaly with mild bilateral pulmonary interstitial prominence suggesting CHF. Electronically Signed   By: Marcello Moores  Register   On: 05/17/2018 11:56    Procedures Procedures (including critical care time)  CRITICAL CARE Performed by: Fredia Sorrow Total critical care time: 30 minutes Critical care time was exclusive of separately billable procedures and treating other patients. Critical care was necessary to treat or prevent imminent or life-threatening deterioration. Critical care was time spent personally by me on the following activities: development of treatment plan with patient and/or surrogate as well as nursing, discussions with consultants, evaluation of patient's response to treatment,  examination of patient, obtaining history from patient or surrogate, ordering and performing treatments and interventions, ordering and review of laboratory studies, ordering and review of radiographic studies, pulse oximetry and re-evaluation of patient's condition.   Medications Ordered in  ED Medications  0.9 %  sodium chloride infusion ( Intravenous New Bag/Given 05/17/18 1104)  sodium chloride 0.9 % bolus 1,000 mL (0 mLs Intravenous Stopped 05/17/18 1240)     Initial Impression / Assessment and Plan / ED Course  I have reviewed the triage vital signs and the nursing notes.  Pertinent labs & imaging results that were available during my care of the patient were reviewed by me and considered in my medical decision making (see chart for details).     Patient's initial hypotension upon arrival responded well to 1 L of normal saline and blood pressures got as high as systolics in the 010U.  Patient still with persistent left lower quadrant abdominal pain.  Chest x-rays raise some concern for some pulmonary edema.  But BNP was normal troponin was normal white counts normal.  No significant anemia.  Urinalysis negative for urinary tract infection.  Electrolytes without significant abnormalities including liver function test.  In addition her lactic acid was normal.  However patient did go on to drop her pressures again up into the upper 80s while still laying flat.  No further seizure activity here.  Is possible to seizure activity could have been related to the hypotension.  Because she was noted to be hypotensive at the doctor's office as well.  Clinically no vital sign parameters for sepsis.  However this has been a concern.  Will repeat lactic acid and also get blood cultures.  But nothing so far to treat with antibiotics.  Due to the dropping of the blood pressure again now it is back up into the 90s without any specific intervention.  Patient will require admission and observation.  CT chest and CT abdomen are still pending to evaluate the left lower quadrant pain and evaluate the questionable fluid in the lungs.  Patient has a significant dye allergy therefore could not have study with IV contrast.  Patient oxygen levels are okay however she is on 2 L of oxygen but not able to  specifically rule out PE with CT angios.  Patient not really with any chest pain or any shortness of breath symptoms.  Symptoms seem to be mostly related to the left lower quadrant abdominal pain in the abdomen and there could certainly be a complicating factors of diverticulitis.  So CT scan results are important prior to arranging admission.    Final Clinical Impressions(s) / ED Diagnoses   Final diagnoses:  Hypotension, unspecified hypotension type  Left lower quadrant abdominal pain  Seizure [facility])    ED Discharge Orders    None       Fredia Sorrow, MD 05/17/18 1535   Patient perhaps may be with a brief seizure episode.  Also had a coughing episode.  Did cough up some yellow phlegm.  As stated above patient's work-up otherwise is been negative.  Patient blood pressures currently systolic of 725 her oxygen levels are 97%.  She is feeling a little wheezy so we will give her duo nebulizer.  Discussed with the hospitalist her concern when head CT and one consultation by neurology.  This will be the hospitalist at [facility].  I will turn this over for follow-up by Dr. Roderic Palau.   Fredia Sorrow, MD 05/17/18 1630

## 2018-05-18 ENCOUNTER — Telehealth: Payer: Self-pay | Admitting: Family Medicine

## 2018-05-18 ENCOUNTER — Telehealth: Payer: Self-pay

## 2018-05-18 DIAGNOSIS — R569 Unspecified convulsions: Secondary | ICD-10-CM | POA: Diagnosis not present

## 2018-05-18 LAB — CBC
HCT: 33.6 % — ABNORMAL LOW (ref 36.0–46.0)
Hemoglobin: 10.3 g/dL — ABNORMAL LOW (ref 12.0–15.0)
MCH: 29 pg (ref 26.0–34.0)
MCHC: 30.7 g/dL (ref 30.0–36.0)
MCV: 94.6 fL (ref 80.0–100.0)
Platelets: 251 10*3/uL (ref 150–400)
RBC: 3.55 MIL/uL — ABNORMAL LOW (ref 3.87–5.11)
RDW: 13.3 % (ref 11.5–15.5)
WBC: 9 10*3/uL (ref 4.0–10.5)
nRBC: 0 % (ref 0.0–0.2)

## 2018-05-18 LAB — BASIC METABOLIC PANEL
Anion gap: 8 (ref 5–15)
BUN: 15 mg/dL (ref 8–23)
CO2: 17 mmol/L — AB (ref 22–32)
Calcium: 7.9 mg/dL — ABNORMAL LOW (ref 8.9–10.3)
Chloride: 112 mmol/L — ABNORMAL HIGH (ref 98–111)
Creatinine, Ser: 0.62 mg/dL (ref 0.44–1.00)
GFR calc Af Amer: >60 mL/min (ref >60)
GLUCOSE: 87 mg/dL (ref 70–99)
POTASSIUM: 4.7 mmol/L (ref 3.5–5.1)
Sodium: 137 mmol/L (ref 135–145)

## 2018-05-18 LAB — MRSA PCR SCREENING: MRSA by PCR: POSITIVE — AB

## 2018-05-18 LAB — BRAIN NATRIURETIC PEPTIDE: B NATRIURETIC PEPTIDE 5: 123 pg/mL — AB (ref 0.0–100.0)

## 2018-05-18 MED ORDER — ENOXAPARIN SODIUM 40 MG/0.4ML ~~LOC~~ SOLN: 40.0000 mg | SUBCUTANEOUS | Status: DC

## 2018-05-18 MED ORDER — MUPIROCIN 2 % EX OINT
1.0000 "application " | TOPICAL_OINTMENT | Freq: Two times a day (BID) | CUTANEOUS | Status: DC
  Administered 2018-05-18: 1 via NASAL
  Filled 2018-05-18: qty 22

## 2018-05-18 MED ORDER — LEVETIRACETAM 1000 MG PO TABS: 1000.0000 mg | ORAL_TABLET | Freq: Two times a day (BID) | ORAL | 2 refills | Status: DC

## 2018-05-18 MED ORDER — CHLORHEXIDINE GLUCONATE CLOTH 2 % EX PADS
6.0000 | MEDICATED_PAD | Freq: Every day | CUTANEOUS | Status: DC
  Administered 2018-05-18: 6 via TOPICAL

## 2018-05-18 NOTE — Care Management Obs Status (Signed)
[facility] NOTIFICATION   Patient Details  Name: Tasha Clarke MRN: [id number] Date of Birth: 1951/01/26   Medicare Observation Status Notification Given:  Yes    Sherald Barge, RN 05/18/2018, 9:28 AM

## 2018-05-18 NOTE — Discharge Summary (Signed)
Physician Discharge Summary  Tasha Clarke WUJ:[id number] DOB: 1950-09-01 DOA: 05/17/2018  PCP: Dettinger, Fransisca Kaufmann, MD  Admit date: 05/17/2018 Discharge date: 05/18/2018  Admitted From: Home Disposition:  Home  Recommendations for Outpatient Follow-up:  1. Follow up with PCP in 1-2 weeks 2. Please obtain BMP/CBC in one week   Home Health: No Equipment/Devices:None  Discharge Condition: stable CODE STATUS: FULL Diet recommendation: Heart Healthy   Brief/Interim Summary:  #) Seizure-like activity: Patient was admitted with seizure-like activity and found to be postictal.  She was back to baseline on discharge.  Her levetiracetam was increased to thousand milligrams twice daily.  She was told to follow-up with an outpatient neurologist.  She was also informed not to drive.  #) COPD: Patient was stable on her home 2 L.  She was continued on her home short-acting bronchodilators and LABA/ICS.  #) Hyper thyroidism: Patient was continued on home levothyroxine.  #) Hypertension/hyperlipidemia: Patient was continued on home atorvastatin, prolonged, losartan.  #) Osteoporosis: Patient was continued on home alendronate.  #) Chronic pain: Patient was continued on home Fioricet, cyclobenzaprine, exercise Tylenol, duloxetine, gabapentin, buprenorphine.  Discharge Diagnoses:  Active Problems:   COPD (chronic obstructive pulmonary disease) (HCC)   Chronic pain syndrome   Hypothyroid   Hypomagnesemia   Tobacco abuse disorder   Essential hypertension   Diarrhea   Seizure-like activity (HCC)   At risk for polypharmacy    Discharge Instructions  Discharge Instructions    Call MD for:  difficulty breathing, headache or visual disturbances   Complete by:  As directed    Call MD for:  hives   Complete by:  As directed    Call MD for:  persistant nausea and vomiting   Complete by:  As directed    Call MD for:  redness, tenderness, or signs of infection (pain, swelling,  redness, odor or green/yellow discharge around incision site)   Complete by:  As directed    Call MD for:  severe uncontrolled pain   Complete by:  As directed    Call MD for:  temperature >100.4   Complete by:  As directed    Diet - low sodium heart healthy   Complete by:  As directed    Discharge instructions   Complete by:  As directed    Follow-up with your primary care doctor in 1 week.  Please stop driving.   Increase activity slowly   Complete by:  As directed      Allergies as of 05/18/2018      Reactions   Cephalexin Hives   Ketorolac Tromethamine Hives   Lac Bovis Nausea And Vomiting   Imdur  [isosorbide Dinitrate]    Iodinated Diagnostic Agents    Other Other (See Comments)   Alka seltzer causes blurred vision, fainting   Sodium Bicarbonate-citric Acid    Milk-related Compounds Nausea And Vomiting      Medication List    TAKE these medications   alendronate 70 MG tablet Commonly known as:  FOSAMAX Take 1 tablet (70 mg total) by mouth every Monday. Take with a full glass of water on an empty stomach. Start taking on:  05/22/2018   aspirin EC 81 MG tablet Take 81 mg by mouth daily.   atorvastatin 40 MG tablet Commonly known as:  LIPITOR Take 40 mg by mouth daily.   butalbital-acetaminophen-caffeine 50-325-40 MG tablet Commonly known as:  FIORICET, ESGIC Take by mouth 2 (two) times daily as needed for headache or migraine.  BUTRANS 10 MCG/HR Ptwk patch Generic drug:  buprenorphine Place 10 mcg onto the skin once a week.   cetirizine 10 MG tablet Commonly known as:  ZYRTEC TAKE 1 TABLET BY MOUTH ONCE DAILY   cyclobenzaprine 10 MG tablet Commonly known as:  FLEXERIL Take 1 tablet (10 mg total) by mouth 2 (two) times daily as needed for muscle spasms.   DULoxetine 60 MG capsule Commonly known as:  CYMBALTA Take 1 capsule (60 mg total) by mouth daily.   EPINEPHrine 0.3 mg/0.3 mL Soaj injection Commonly known as:  EPI-PEN Inject 0.3 mg into the  muscle as needed.   Fish Oil 1000 MG Caps Take 1,000 mg by mouth daily.   fluticasone 50 MCG/ACT nasal spray Commonly known as:  FLONASE Place 2 sprays into both nostrils daily.   fluticasone furoate-vilanterol 200-25 MCG/INH Aepb Commonly known as:  BREO ELLIPTA Inhale 1 puff into the lungs daily.   Fluticasone-Salmeterol 250-50 MCG/DOSE Aepb Commonly known as:  ADVAIR Inhale 1 puff into the lungs 2 (two) times daily.   folic acid 1 MG tablet Commonly known as:  FOLVITE Take 1 mg by mouth daily.   gabapentin 600 MG tablet Commonly known as:  NEURONTIN Take 1 tablet (600 mg total) by mouth 4 (four) times daily.   ibuprofen 200 MG tablet Commonly known as:  ADVIL,MOTRIN Take 200 mg by mouth every 6 (six) hours as needed.   ipratropium-albuterol 0.5-2.5 (3) MG/3ML Soln Commonly known as:  DUONEB Take 3 mLs by nebulization every 6 (six) hours as needed (shortness of breathe).   Iron 325 (65 Fe) MG Tabs Take 1 tablet (325 mg total) by mouth daily.   levETIRAcetam 1000 MG tablet Commonly known as:  KEPPRA Take 1 tablet (1,000 mg total) by mouth 2 (two) times daily. What changed:    when to take this  Another medication with the same name was removed. Continue taking this medication, and follow the directions you see here.   levothyroxine 50 MCG tablet Commonly known as:  SYNTHROID, LEVOTHROID Take 1 tablet (50 mcg total) by mouth daily before breakfast.   losartan 100 MG tablet Commonly known as:  COZAAR Take 0.5 tablets (50 mg total) by mouth daily.   Magnesium 400 MG Caps Take 400 mg by mouth daily.   Melatonin 10 MG Tabs Take by mouth.   metoprolol tartrate 25 MG tablet Commonly known as:  LOPRESSOR Take 25 mg by mouth 2 (two) times daily.   multivitamin tablet Take 1 tablet by mouth daily.   NARCAN 4 MG/0.1ML Liqd nasal spray kit Generic drug:  naloxone Place into the nose.   potassium chloride 10 MEQ tablet Commonly known as:  K-DUR Take 1  tablet by mouth daily.   PROVENTIL HFA 108 (90 Base) MCG/ACT inhaler Generic drug:  albuterol Inhale into the lungs.   TYLENOL PM EXTRA STRENGTH 25-500 MG Tabs tablet Generic drug:  diphenhydramine-acetaminophen Take 1 tablet by mouth at bedtime as needed (pain).   VITAMIN B-12 PO Take by mouth daily.   Vitamin D-3 125 MCG (5000 UT) Tabs Take 5,000 Units by mouth daily.       Allergies  Allergen Reactions  . Cephalexin Hives  . Ketorolac Tromethamine Hives  . Lac Bovis Nausea And Vomiting  . Imdur  [Isosorbide Dinitrate]   . Iodinated Diagnostic Agents   . Other Other (See Comments)    Alka seltzer causes blurred vision, fainting  . Sodium Bicarbonate-Citric Acid   . Milk-Related Compounds Nausea And Vomiting  Consultations:  None   Procedures/Studies: Ct Abdomen Pelvis Wo Contrast  Result Date: 05/17/2018 CLINICAL DATA:  Vomiting, diarrhea and left lower quadrant abdominal pain for 1 week. EXAM: CT CHEST, ABDOMEN AND PELVIS WITHOUT CONTRAST TECHNIQUE: Multidetector CT imaging of the chest, abdomen and pelvis was performed following the standard protocol without IV contrast. COMPARISON:  CT chest 10/28/2017. CT abdomen and pelvis 01/17/2018. Single-view of the chest earlier today. PA and lateral chest 08/25/2017. FINDINGS: CT CHEST FINDINGS Cardiovascular: Heart size is upper normal. Calcific aortic and coronary atherosclerosis is identified. No pericardial effusion. Mediastinum/Nodes: The walls of the distal esophagus appear mildly thickened but unchanged compared the prior chest CT. Note is made that the esophagus appeared normal on endoscopy 03/16/2018. The left lobe of the thyroid is mildly enlarged with some coarse calcifications present. No lymphadenopathy. Lungs/Pleura: Mild dependent atelectasis is present. Mild ground-glass attenuation is seen in the upper lobes bilaterally, unchanged. Mild dependent atelectasis noted. Musculoskeletal: No acute or focal abnormality.  Spinal stimulator is in place. CT ABDOMEN PELVIS FINDINGS Hepatobiliary: No focal liver abnormality is seen. Status post cholecystectomy. No biliary dilatation. Pancreas: Unremarkable. No pancreatic ductal dilatation or surrounding inflammatory changes. Spleen: Normal in size without focal abnormality. Adrenals/Urinary Tract: Mild thickening of the left adrenal gland compatible with hyperplasia is unchanged. Tiny right adrenal adenoma is also unchanged. The kidneys, ureters and urinary bladder appear normal. Punctate calcification projecting in the right renal hilum is likely vascular in origin. Stomach/Bowel: Stomach is within normal limits. The patient is status post appendectomy. No evidence of bowel wall thickening, distention, or inflammatory changes. Vascular/Lymphatic: Aortic atherosclerosis. No enlarged abdominal or pelvic lymph nodes. Reproductive: Status post hysterectomy. No adnexal masses. Other: None. Musculoskeletal: No acute or focal abnormality. The patient is status post L3-4 laminectomy. IMPRESSION: No acute abnormality or finding to explain the patient's symptoms in the chest, abdomen or pelvis. Extensive calcific aortic and coronary atherosclerosis. Status post appendectomy, cholecystectomy and hysterectomy. Electronically Signed   By: Inge Rise M.D.   On: 05/17/2018 15:44   Ct Head Wo Contrast  Result Date: 05/17/2018 CLINICAL DATA:  Altered level of consciousness.  Seizure. EXAM: CT HEAD WITHOUT CONTRAST TECHNIQUE: Contiguous axial images were obtained from the base of the skull through the vertex without intravenous contrast. COMPARISON:  CT head 01/27/2018 FINDINGS: Brain: Mild frontal atrophy. Ventricle size normal. Negative for infarct, hemorrhage, mass Vascular: Negative for hyperdense vessel Skull: Negative Sinuses/Orbits: Benign osteoma right frontal sinus unchanged. Remaining sinuses clear. Bilateral cataract surgery Other: None IMPRESSION: No acute abnormality.  Electronically Signed   By: Franchot Gallo M.D.   On: 05/17/2018 17:36   Ct Chest Wo Contrast  Result Date: 05/17/2018 CLINICAL DATA:  Vomiting, diarrhea and left lower quadrant abdominal pain for 1 week. EXAM: CT CHEST, ABDOMEN AND PELVIS WITHOUT CONTRAST TECHNIQUE: Multidetector CT imaging of the chest, abdomen and pelvis was performed following the standard protocol without IV contrast. COMPARISON:  CT chest 10/28/2017. CT abdomen and pelvis 01/17/2018. Single-view of the chest earlier today. PA and lateral chest 08/25/2017. FINDINGS: CT CHEST FINDINGS Cardiovascular: Heart size is upper normal. Calcific aortic and coronary atherosclerosis is identified. No pericardial effusion. Mediastinum/Nodes: The walls of the distal esophagus appear mildly thickened but unchanged compared the prior chest CT. Note is made that the esophagus appeared normal on endoscopy 03/16/2018. The left lobe of the thyroid is mildly enlarged with some coarse calcifications present. No lymphadenopathy. Lungs/Pleura: Mild dependent atelectasis is present. Mild ground-glass attenuation is seen in the upper lobes bilaterally,  unchanged. Mild dependent atelectasis noted. Musculoskeletal: No acute or focal abnormality. Spinal stimulator is in place. CT ABDOMEN PELVIS FINDINGS Hepatobiliary: No focal liver abnormality is seen. Status post cholecystectomy. No biliary dilatation. Pancreas: Unremarkable. No pancreatic ductal dilatation or surrounding inflammatory changes. Spleen: Normal in size without focal abnormality. Adrenals/Urinary Tract: Mild thickening of the left adrenal gland compatible with hyperplasia is unchanged. Tiny right adrenal adenoma is also unchanged. The kidneys, ureters and urinary bladder appear normal. Punctate calcification projecting in the right renal hilum is likely vascular in origin. Stomach/Bowel: Stomach is within normal limits. The patient is status post appendectomy. No evidence of bowel wall thickening,  distention, or inflammatory changes. Vascular/Lymphatic: Aortic atherosclerosis. No enlarged abdominal or pelvic lymph nodes. Reproductive: Status post hysterectomy. No adnexal masses. Other: None. Musculoskeletal: No acute or focal abnormality. The patient is status post L3-4 laminectomy. IMPRESSION: No acute abnormality or finding to explain the patient's symptoms in the chest, abdomen or pelvis. Extensive calcific aortic and coronary atherosclerosis. Status post appendectomy, cholecystectomy and hysterectomy. Electronically Signed   By: Inge Rise M.D.   On: 05/17/2018 15:44   Dg Chest Port 1 View  Result Date: 05/17/2018 CLINICAL DATA:  Abdominal pain with hypotension. EXAM: PORTABLE CHEST 1 VIEW COMPARISON:  01/30/2018. FINDINGS: Interval removal of right IJ line. Cardiomegaly with mild bilateral from interstitial prominence suggesting CHF. No pleural effusion or pneumothorax. Prior cervicothoracic spine fusion. IMPRESSION: 1.  Interval removal of right IJ line. 2. Cardiomegaly with mild bilateral pulmonary interstitial prominence suggesting CHF. Electronically Signed   By: Marcello Moores  Register   On: 05/17/2018 11:56   US Breast Ltd Uni Right Inc Axilla  Result Date: 05/11/2018 CLINICAL DATA:  67 year old patient recalled from recent screening mammogram for evaluation of possible mass in the right breast. This mass is located in the lateral retroareolar right breast and measures approximately 3 mm. EXAM: ULTRASOUND OF THE RIGHT BREAST COMPARISON:  Screening mammogram April 25, 2018 FINDINGS: On physical exam, no mass is palpated in the outer retroareolar right breast. Targeted ultrasound is performed, showing a circumscribed oval nearly anechoic mass with some internal echogenicities and posterior acoustic enhancement at 9 o'clock position retroareolar. This is consistent with a mildly complicated cyst and measures 0.4 x 0.3 x 0.3 cm. There is no associated vascular flow. IMPRESSION: Benign cyst 9  o'clock retroareolar right breast. RECOMMENDATION: Screening mammogram in one year.(Code:SM-B-01Y) I have discussed the findings and recommendations with the patient. Results were also provided in writing at the conclusion of the visit. If applicable, a reminder letter will be sent to the patient regarding the next appointment. BI-RADS CATEGORY  2: Benign. Electronically Signed   By: Curlene Dolphin M.D.   On: 05/11/2018 16:41   Mm 3d Screen Breast Bilateral  Result Date: 04/25/2018 CLINICAL DATA:  Screening. EXAM: DIGITAL SCREENING BILATERAL MAMMOGRAM WITH TOMO AND CAD COMPARISON:  None. ACR Breast Density Category b: There are scattered areas of fibroglandular density. FINDINGS: In the right breast, a possible mass warrants further evaluation. In the left breast, no findings suspicious for malignancy. Images were processed with CAD. IMPRESSION: Further evaluation is suggested for possible mass in the right breast. RECOMMENDATION: Ultrasound of the right breast. (Code:[id number]-R-19M) The patient will be contacted regarding the findings, and additional imaging will be scheduled. BI-RADS CATEGORY  0: Incomplete. Need additional imaging evaluation and/or prior mammograms for comparison. Electronically Signed   By: Everlean Alstrom M.D.   On: 04/25/2018 16:59      Subjective:   Discharge Exam: Vitals:  05/17/18 2128 05/18/18 0532  BP: (!) 117/52 (!) 119/56  Pulse: 67 68  Resp: 17 18  Temp: 98.7 F (37.1 C) 98 F (36.7 C)  SpO2: 100% 99%   Vitals:   05/17/18 2030 05/17/18 2100 05/17/18 2128 05/18/18 0532  BP: (!) 109/43 (!) 104/59 (!) 117/52 (!) 119/56  Pulse: 69 68 67 68  Resp: _0 Temp:   98.7 F (37.1 C) 98 F (36.7 C)  TempSrc:      SpO2: 99% 97% 100% 99%  Weight:   (!) 213.1 kg 95.3 kg  Height:   _1  (1.651 m)     General: Pt is alert, awake, not in acute distress Cardiovascular: RRR, S1/S2 +, no rubs, no gallops Respiratory: CTA bilaterally, no wheezing, no  rhonchi Abdominal: Soft, NT, ND, bowel sounds + Extremities: no edema, no cyanosis    The results of significant diagnostics from this hospitalization (including imaging, microbiology, ancillary and laboratory) are listed below for reference.     Microbiology: Recent Results (from the past 240 hour(s))  Culture, blood (Routine X 2) w Reflex to ID Panel     Status: None (Preliminary result)   Collection Time: 05/17/18  3:55 PM  Result Value Ref Range Status   Specimen Description RIGHT ANTECUBITAL  Final   Special Requests   Final    BOTTLES DRAWN AEROBIC ONLY Blood Culture adequate volume   Culture   Final    NO GROWTH < 24 HOURS Performed at [facility], [address], [place], [place] [address]    Report Status PENDING  Incomplete  Culture, blood (Routine X 2) w Reflex to ID Panel     Status: None (Preliminary result)   Collection Time: 05/17/18  4:14 PM  Result Value Ref Range Status   Specimen Description BLOOD LEFT FOREARM  Final   Special Requests   Final    BOTTLES DRAWN AEROBIC ONLY Blood Culture adequate volume   Culture   Final    NO GROWTH < 24 HOURS Performed at [facility], [address], [place], [place] [address]    Report Status PENDING  Incomplete  MRSA PCR Screening     Status: Abnormal   Collection Time: 05/18/18  5:50 AM  Result Value Ref Range Status   MRSA by PCR POSITIVE (A) NEGATIVE Final    Comment:        The GeneXpert MRSA Assay (FDA approved for NASAL specimens only), is one component of a comprehensive MRSA colonization surveillance program. It is not intended to diagnose MRSA infection nor to guide or monitor treatment for MRSA infections. CRITICAL RESULT CALLED TO, READ BACK BY AND VERIFIED WITH: MAYS J AT 0832A ON [id number] BY THOMPSON S. Performed at [facility], [address] [place], [place] [address]      Labs: BNP (last 3 results) Recent Labs    10/28/17 1657 05/17/18 1053 05/17/18 2357  BNP 53.0 32.0 123.0*   Basic  Metabolic Panel: Recent Labs  Lab 05/17/18 1053 05/18/18 0512  NA 135 137  K 4.1 4.7  CL 104 112*  CO2 23 17*  GLUCOSE 113* 87  BUN 29* 15  CREATININE 1.10* 0.62  CALCIUM 9.0 7.9*  MG 1.6*  --    Liver Function Tests: Recent Labs  Lab 05/17/18 1053  AST 19  ALT 17  ALKPHOS 70  BILITOT 0.5  PROT 7.2  ALBUMIN 3.9   Recent Labs  Lab 05/17/18 1053  LIPASE 29   No  results for input(s): AMMONIA in the last 168 hours. CBC: Recent Labs  Lab 05/17/18 1053 05/18/18 0512  WBC 9.8 9.0  NEUTROABS 6.1  --   HGB 11.1* 10.3*  HCT 36.9 33.6*  MCV 96.9 94.6  PLT 310 251   Cardiac Enzymes: Recent Labs  Lab 05/17/18 1053  TROPONINI <0.03   BNP: Invalid input(s): POCBNP CBG: Recent Labs  Lab 05/17/18 1046  GLUCAP 102*   D-Dimer No results for input(s): DDIMER in the last 72 hours. Hgb A1c No results for input(s): HGBA1C in the last 72 hours. Lipid Profile No results for input(s): CHOL, HDL, LDLCALC, TRIG, CHOLHDL, LDLDIRECT in the last 72 hours. Thyroid function studies No results for input(s): TSH, T4TOTAL, T3FREE, THYROIDAB in the last 72 hours.  Invalid input(s): FREET3 Anemia work up No results for input(s): VITAMINB12, FOLATE, FERRITIN, TIBC, IRON, RETICCTPCT in the last 72 hours. Urinalysis    Component Value Date/Time   COLORURINE YELLOW 05/17/2018 1053   APPEARANCEUR CLEAR 05/17/2018 1053   LABSPEC 1.011 05/17/2018 1053   PHURINE 5.0 05/17/2018 1053   GLUCOSEU NEGATIVE 05/17/2018 1053   HGBUR NEGATIVE 05/17/2018 [place] 05/17/2018 1053   KETONESUR NEGATIVE 05/17/2018 1053   PROTEINUR NEGATIVE 05/17/2018 1053   NITRITE NEGATIVE 05/17/2018 1053   LEUKOCYTESUR NEGATIVE 05/17/2018 1053   Sepsis Labs Invalid input(s): PROCALCITONIN,  WBC,  LACTICIDVEN Microbiology Recent Results (from the past 240 hour(s))  Culture, blood (Routine X 2) w Reflex to ID Panel     Status: None (Preliminary result)   Collection Time: 05/17/18   3:55 PM  Result Value Ref Range Status   Specimen Description RIGHT ANTECUBITAL  Final   Special Requests   Final    BOTTLES DRAWN AEROBIC ONLY Blood Culture adequate volume   Culture   Final    NO GROWTH < 24 HOURS Performed at [facility], [address] [place], [place] [address]    Report Status PENDING  Incomplete  Culture, blood (Routine X 2) w Reflex to ID Panel     Status: None (Preliminary result)   Collection Time: 05/17/18  4:14 PM  Result Value Ref Range Status   Specimen Description BLOOD LEFT FOREARM  Final   Special Requests   Final    BOTTLES DRAWN AEROBIC ONLY Blood Culture adequate volume   Culture   Final    NO GROWTH < 24 HOURS Performed at [facility], [address] [place], [place] [address]    Report Status PENDING  Incomplete  MRSA PCR Screening     Status: Abnormal   Collection Time: 05/18/18  5:50 AM  Result Value Ref Range Status   MRSA by PCR POSITIVE (A) NEGATIVE Final    Comment:        The GeneXpert MRSA Assay (FDA approved for NASAL specimens only), is one component of a comprehensive MRSA colonization surveillance program. It is not intended to diagnose MRSA infection nor to guide or monitor treatment for MRSA infections. CRITICAL RESULT CALLED TO, READ BACK BY AND VERIFIED WITH: MAYS J AT 0832A ON [id number] BY THOMPSON S. Performed at [facility], [address], [place], [place] [address]      Time coordinating discharge:35  SIGNED:   Cristy Folks, MD  [facility] 05/18/2018, 11:17 AM  If 7PM-7AM, please contact night-coverage [email or web address] Password [email or web address]

## 2018-05-18 NOTE — Progress Notes (Signed)
CRITICAL VALUE ALERT  Critical Value:  MRSA PCR Positive  Date & Time Notied:  05/18/18 0830  Provider Notified: Dr. Herbert Moors  Orders Received/Actions taken: Standing Orders Placed

## 2018-05-18 NOTE — Telephone Encounter (Signed)
[facility] - Left Message  

## 2018-05-18 NOTE — Discharge Instructions (Signed)
Seizure, Adult °When you have a seizure: °· Parts of your body may move. °· How aware or awake (conscious) you are may change. °· You may shake (convulse). ° °Some people have symptoms right before a seizure happens. These symptoms may include: °· Fear. °· Worry (anxiety). °· Feeling like you are going to throw up (nausea). °· Feeling like the room is spinning (vertigo). °· Feeling like you saw or heard something before (deja vu). °· Odd tastes or smells. °· Changes in vision, such as seeing flashing lights or spots. ° °Seizures usually last from 30 seconds to 2 minutes. Usually, they are not harmful unless they last a long time. °Follow these instructions at home: °Medicines °· Take over-the-counter and prescription medicines only as told by your doctor. °· Avoid anything that may keep your medicine from working, such as alcohol. °Activity °· Do not do any activities that would be dangerous if you had another seizure, like driving or swimming. Wait until your doctor approves. °· If you live in the [place] ask your local DMV (department of motor vehicles) when you can drive. °· Rest. °Teaching others °· Teach friends and family what to do when you have a seizure. They should: °? Lay you on the ground. °? Protect your head and body. °? Loosen any tight clothing around your neck. °? Turn you on your side. °? Stay with you until you are better. °? Not hold you down. °? Not put anything in your mouth. °? Know whether or not you need emergency care. °General instructions °· Contact your doctor each time you have a seizure. °· Avoid anything that gives you seizures. °· Keep a seizure diary. Write down: °? What you think caused each seizure. °? What you remember about each seizure. °· Keep all follow-up visits as told by your doctor. This is important. °Contact a doctor if: °· You have another seizure. °· You have seizures more often. °· There is any change in what happens during your seizures. °· You continue to have  seizures with treatment. °· You have symptoms of being sick or having an infection. °Get help right away if: °· You have a seizure: °? That lasts longer than 5 minutes. °? That is different than seizures you had before. °? That makes it harder to breathe. °? After you hurt your head. °· After a seizure, you cannot speak or use a part of your body. °· After a seizure, you are confused or have a bad headache. °· You have two or more seizures in a row. °· You are having seizures more often. °· You do not wake up right after a seizure. °· You get hurt during a seizure. °In an emergency: °· These symptoms may be an emergency. Do not wait to see if the symptoms will go away. Get medical help right away. Call your local emergency services (911 in the [place] Do not drive yourself to the hospital. °This information is not intended to replace advice given to you by your health care provider. Make sure you discuss any questions you have with your health care provider. °Document Released: 12/15/2007 Document Revised: 03/10/2016 Document Reviewed: 03/10/2016 °Elsevier Interactive Patient Education © 2017 [employer] Inc. ° °

## 2018-05-18 NOTE — Progress Notes (Signed)
Notified Dr Olevia Bowens that pt requesting home meds to be reordered. No new orders at this time

## 2018-05-18 NOTE — Progress Notes (Signed)
Pt discharged home today per Dr. Herbert Moors. Pt's IV site D/C'd and WDL. Pt's VSS. Pt provided with home medication list, discharge instructions and prescriptions. Verbalized understanding. Pt left floor via WC in stable condition accompanied by NT.

## 2018-05-18 NOTE — Telephone Encounter (Signed)
Yes I agree that she should not be driving

## 2018-05-18 NOTE — Care Management (Signed)
CM has contacted PCP office to make sure they are aware that pt continues to drive despite being told not to and reports having 1 seizure per month. PCP office to follow up with possible DMV reporting.

## 2018-05-19 ENCOUNTER — Other Ambulatory Visit: Payer: Self-pay | Admitting: *Deleted

## 2018-05-19 NOTE — Patient Outreach (Signed)
Transition of care call attempted unsuccessfully. I did leave a message and requested a return call. If I do not hear back from [name] I will call her on Monday.  Tasha Clarke. [name], MSN, [facility] Gerontological Nurse Practitioner [facility] Care Management [phone]

## 2018-05-22 ENCOUNTER — Other Ambulatory Visit: Payer: Self-pay | Admitting: *Deleted

## 2018-05-22 LAB — CULTURE, BLOOD (ROUTINE X 2)
Culture: NO GROWTH
Culture: NO GROWTH
SPECIAL REQUESTS: ADEQUATE
Special Requests: ADEQUATE

## 2018-05-22 NOTE — Patient Outreach (Signed)
Transition of care, 2nd attempt. Talked with husband and completed transition of care template. He reports she felt pretty good over the weekend.  Request pt's own phone number as I would like to talk to her myself to see how she is doing and to also make an appt for me to see her. I have called her number and left a message for her to all me back.  [name]. [name], MSN, [facility] Gerontological Nurse Practitioner [facility] Care Management [phone]

## 2018-05-24 ENCOUNTER — Encounter: Payer: Self-pay | Admitting: Family Medicine

## 2018-05-24 ENCOUNTER — Ambulatory Visit: Payer: Medicare PPO | Admitting: Family Medicine

## 2018-05-24 VITALS — BP 101/74 | HR 79 | Temp 98.5°F

## 2018-05-24 DIAGNOSIS — G40909 Epilepsy, unspecified, not intractable, without status epilepticus: Secondary | ICD-10-CM

## 2018-05-24 DIAGNOSIS — J810 Acute pulmonary edema: Secondary | ICD-10-CM

## 2018-05-24 DIAGNOSIS — I959 Hypotension, unspecified: Secondary | ICD-10-CM

## 2018-05-24 DIAGNOSIS — F339 Major depressive disorder, recurrent, unspecified: Secondary | ICD-10-CM

## 2018-05-24 DIAGNOSIS — R7989 Other specified abnormal findings of blood chemistry: Secondary | ICD-10-CM

## 2018-05-24 LAB — CBC WITH DIFFERENTIAL/PLATELET
BASOS ABS: 0.1 10*3/uL (ref 0.0–0.2)
Basos: 1 %
EOS (ABSOLUTE): 0.6 10*3/uL — AB (ref 0.0–0.4)
Eos: 6 %
HEMATOCRIT: 34.8 % (ref 34.0–46.6)
HEMOGLOBIN: 11.4 g/dL (ref 11.1–15.9)
IMMATURE GRANS (ABS): 0.1 10*3/uL (ref 0.0–0.1)
IMMATURE GRANULOCYTES: 1 %
LYMPHS ABS: 2.1 10*3/uL (ref 0.7–3.1)
Lymphs: 20 %
MCH: 29.2 pg (ref 26.6–33.0)
MCHC: 32.8 g/dL (ref 31.5–35.7)
MCV: 89 fL (ref 79–97)
MONOS ABS: 0.8 10*3/uL (ref 0.1–0.9)
Monocytes: 8 %
Neutrophils Absolute: 6.7 10*3/uL (ref 1.4–7.0)
Neutrophils: 64 %
Platelets: 346 10*3/uL (ref 150–450)
RBC: 3.91 x10E6/uL (ref 3.77–5.28)
RDW: 12.8 % (ref 12.3–15.4)
WBC: 10.5 10*3/uL (ref 3.4–10.8)

## 2018-05-24 LAB — CMP14+EGFR
ALBUMIN: 4 g/dL (ref 3.6–4.8)
ALT: 14 IU/L (ref 0–32)
AST: 19 IU/L (ref 0–40)
Albumin/Globulin Ratio: 1.5 (ref 1.2–2.2)
Alkaline Phosphatase: 86 IU/L (ref 39–117)
BUN / CREAT RATIO: 21 (ref 12–28)
BUN: 21 mg/dL (ref 8–27)
Bilirubin Total: <0.2 mg/dL (ref 0.0–1.2)
CALCIUM: 10.1 mg/dL (ref 8.7–10.3)
CO2: 20 mmol/L (ref 20–29)
CREATININE: 1.02 mg/dL — AB (ref 0.57–1.00)
Chloride: 99 mmol/L (ref 96–106)
GFR, EST AFRICAN AMERICAN: 66 mL/min/{1.73_m2} (ref >59)
GFR, EST NON AFRICAN AMERICAN: 57 mL/min/{1.73_m2} — AB (ref >59)
GLOBULIN, TOTAL: 2.7 g/dL (ref 1.5–4.5)
Glucose: 87 mg/dL (ref 65–99)
Potassium: 5 mmol/L (ref 3.5–5.2)
SODIUM: 135 mmol/L (ref 134–144)
TOTAL PROTEIN: 6.7 g/dL (ref 6.0–8.5)

## 2018-05-24 MED ORDER — DULOXETINE HCL 30 MG PO CPEP: 90.0000 mg | ORAL_CAPSULE | Freq: Every day | ORAL | 3 refills | Status: DC

## 2018-05-24 NOTE — Progress Notes (Signed)
BP 101/74   Pulse 79   Temp 98.5 F (36.9 C) (Oral)   SpO2 100%    Subjective:    Patient ID: Tasha Clarke, female    DOB: 15-Mar-1951, 67 y.o.   MRN: [id number]  HPI: Tasha Clarke is a 67 y.o. female presenting on 05/24/2018 for Hospitalization Follow-up (11/06 [facility]. Patient states that "she is better but stil does not feel right".  C/O fatigue and nausea  ) and Seizures (Patient states since leaving hospital she has not had any more seizures.)   HPI Hospital follow-up for pulmonary edema and seizure and hypotension and elevated BNP Patient is coming in for hospital follow-up for seizure and hypotension and fatigue and nausea.  She says that she had no further seizures after being in her office and sent to the hospital.  They did increase her medication but she never did start taking the increase in medication.  Patient does have follow-up with neurology and has not gotten into them as at this point.  Patient's still having a lot of fatigue and a little bit of nausea since hospitalization but is feeling better gradually and is no longer having seizures and no longer postictal like she was.  She says she still does not feel right and a sense of she has been feeling more down and depressed.  During the hospitalization patient was found to have elevated BNP.  She does have chronic respiratory issues and she says they have been about the same since she left and no better or worse.  During the hospital they also stopped her blood pressure medications because she was hypotensive and she was also found to have some pulmonary edema on a CT scan of her chest. Depression screen [facility] 2/9 05/24/2018 05/17/2018 03/16/2018 02/13/2018 02/07/2018  Decreased Interest 2 2 0 1 0  Down, Depressed, Hopeless 2 2 0 1 0  PHQ - 2 Score 4 4 0 2 0  Altered sleeping 3 3 - 3 -  Tired, decreased energy 3 3 - 3 -  Change in appetite 3 3 - 2 -  Feeling bad or failure about yourself  2 2 - 0 -  Trouble  concentrating 1 1 - 1 -  Moving slowly or fidgety/restless - 1 - 0 -  Suicidal thoughts 0 0 - 0 -  PHQ-9 Score 16 17 - 11 -     Relevant past medical, surgical, family and social history reviewed and updated as indicated. Interim medical history since our last visit reviewed. Allergies and medications reviewed and updated.  Review of Systems  Constitutional: Positive for fatigue.  Respiratory: Positive for cough, shortness of breath and wheezing. Negative for chest tightness.   Cardiovascular: Negative for chest pain, palpitations and leg swelling.  Gastrointestinal: Positive for nausea. Negative for abdominal pain, blood in stool, constipation, diarrhea, rectal pain and vomiting.  Endocrine: Negative for cold intolerance and heat intolerance.  Genitourinary: Negative for dysuria, frequency and hematuria.  Neurological: Positive for seizures. Negative for dizziness and weakness.    Per HPI unless specifically indicated above   Allergies as of 05/24/2018      Reactions   Cephalexin Hives   Ketorolac Tromethamine Hives   Lac Bovis Nausea And Vomiting   Imdur  [isosorbide Dinitrate]    Iodinated Diagnostic Agents    Other Other (See Comments)   Alka seltzer causes blurred vision, fainting   Sodium Bicarbonate-citric Acid    Milk-related Compounds Nausea And Vomiting  Medication List        Accurate as of 05/24/18 10:02 AM. Always use your most recent med list.          alendronate 70 MG tablet Commonly known as:  FOSAMAX Take 1 tablet (70 mg total) by mouth every Monday. Take with a full glass of water on an empty stomach.   aspirin EC 81 MG tablet Take 81 mg by mouth daily.   atorvastatin 40 MG tablet Commonly known as:  LIPITOR Take 40 mg by mouth daily.   butalbital-acetaminophen-caffeine 50-325-40 MG tablet Commonly known as:  FIORICET, ESGIC Take by mouth 2 (two) times daily as needed for headache or migraine.   BUTRANS 10 MCG/HR Ptwk patch Generic  drug:  buprenorphine Place 10 mcg onto the skin once a week.   cetirizine 10 MG tablet Commonly known as:  ZYRTEC TAKE 1 TABLET BY MOUTH ONCE DAILY   cyclobenzaprine 10 MG tablet Commonly known as:  FLEXERIL Take 1 tablet (10 mg total) by mouth 2 (two) times daily as needed for muscle spasms.   DULoxetine 30 MG capsule Commonly known as:  CYMBALTA Take 3 capsules (90 mg total) by mouth daily.   EPINEPHrine 0.3 mg/0.3 mL Soaj injection Commonly known as:  EPI-PEN Inject 0.3 mg into the muscle as needed.   Fish Oil 1000 MG Caps Take 1,000 mg by mouth daily.   fluticasone 50 MCG/ACT nasal spray Commonly known as:  FLONASE Place 2 sprays into both nostrils daily.   fluticasone furoate-vilanterol 200-25 MCG/INH Aepb Commonly known as:  BREO ELLIPTA Inhale 1 puff into the lungs daily.   Fluticasone-Salmeterol 250-50 MCG/DOSE Aepb Commonly known as:  ADVAIR Inhale 1 puff into the lungs 2 (two) times daily.   folic acid 1 MG tablet Commonly known as:  FOLVITE Take 1 mg by mouth daily.   gabapentin 600 MG tablet Commonly known as:  NEURONTIN Take 1 tablet (600 mg total) by mouth 4 (four) times daily.   ibuprofen 200 MG tablet Commonly known as:  ADVIL,MOTRIN Take 200 mg by mouth every 6 (six) hours as needed.   ipratropium-albuterol 0.5-2.5 (3) MG/3ML Soln Commonly known as:  DUONEB Take 3 mLs by nebulization every 6 (six) hours as needed (shortness of breathe).   Iron 325 (65 Fe) MG Tabs Take 1 tablet (325 mg total) by mouth daily.   levETIRAcetam 1000 MG tablet Commonly known as:  KEPPRA Take 1 tablet (1,000 mg total) by mouth 2 (two) times daily.   levothyroxine 50 MCG tablet Commonly known as:  SYNTHROID, LEVOTHROID Take 1 tablet (50 mcg total) by mouth daily before breakfast.   Magnesium 400 MG Caps Take 400 mg by mouth daily.   Melatonin 10 MG Tabs Take by mouth.   metoprolol tartrate 25 MG tablet Commonly known as:  LOPRESSOR Take 25 mg by mouth 2  (two) times daily.   multivitamin tablet Take 1 tablet by mouth daily.   NARCAN 4 MG/0.1ML Liqd nasal spray kit Generic drug:  naloxone Place into the nose.   potassium chloride 10 MEQ tablet Commonly known as:  K-DUR Take 1 tablet by mouth daily.   PROVENTIL HFA 108 (90 Base) MCG/ACT inhaler Generic drug:  albuterol Inhale into the lungs.   TYLENOL PM EXTRA STRENGTH 25-500 MG Tabs tablet Generic drug:  diphenhydramine-acetaminophen Take 1 tablet by mouth at bedtime as needed (pain).   VITAMIN B-12 PO Take by mouth daily.   Vitamin D-3 125 MCG (5000 UT) Tabs Take 5,000 Units by  mouth daily.          Objective:    BP 101/74   Pulse 79   Temp 98.5 F (36.9 C) (Oral)   SpO2 100%   Wt Readings from Last 3 Encounters:  05/18/18 210 lb 1.6 oz (95.3 kg)  03/28/18 192 lb (87.1 kg)  03/10/18 193 lb 3.2 oz (87.6 kg)    Physical Exam  Constitutional: She is oriented to person, place, and time. She appears well-developed and well-nourished. No distress.  Eyes: Pupils are equal, round, and reactive to light. Conjunctivae and EOM are normal.  Cardiovascular: Normal rate, regular rhythm, normal heart sounds and intact distal pulses.  No murmur heard. Pulmonary/Chest: Effort normal. No respiratory distress. She has no wheezes. She has rhonchi. She has no rales.  Musculoskeletal: Normal range of motion. She exhibits no edema or tenderness.  Neurological: She is alert and oriented to person, place, and time. Coordination normal.  Skin: Skin is warm and dry. No rash noted. She is not diaphoretic.  Psychiatric: Her behavior is normal. Her mood appears anxious. She exhibits a depressed mood. She expresses no suicidal ideation. She expresses no suicidal plans.  Nursing note and vitals reviewed.      Assessment & Plan:   Problem List Items Addressed This Visit      Cardiovascular and Mediastinum   Hypotension   Relevant Orders   CBC with Differential/Platelet (Completed)    CMP14+EGFR (Completed)     Nervous and Auditory   Seizure disorder ([place])   Relevant Orders   Ambulatory referral to Neurology   CBC with Differential/Platelet (Completed)     Other   Depression, recurrent ([place])   Relevant Medications   DULoxetine (CYMBALTA) 30 MG capsule   Other Relevant Orders   CBC with Differential/Platelet (Completed)    Other Visit Diagnoses    Elevated brain natriuretic peptide (BNP) level    -  Primary   Relevant Orders   ECHOCARDIOGRAM COMPLETE   Acute pulmonary edema (HCC)          Increased her Cymbalta and ordered an echocardiogram because of pulmonary edema and elevated BNP from the hospital visit.  Also get her going with a referral to neurology as they said they set her up but she never did get to go see them.  Recheck CBC and chemistry panel Follow up plan: Return in about 2 months (around 07/24/2018), or if symptoms worsen or fail to improve.  Counseling provided for all of the vaccine components Orders Placed This Encounter  Procedures  . CBC with Differential/Platelet  . CMP14+EGFR  . Ambulatory referral to Neurology  . ECHOCARDIOGRAM COMPLETE    Caryl Pina, MD [place] [facility] 05/24/2018, 10:02 AM

## 2018-05-24 NOTE — Patient Instructions (Addendum)
Decrease cymbalta  Stop losartan Keep keppra and go to neuro  We will schedule echo

## 2018-05-25 DIAGNOSIS — J449 Chronic obstructive pulmonary disease, unspecified: Secondary | ICD-10-CM | POA: Diagnosis not present

## 2018-05-30 ENCOUNTER — Other Ambulatory Visit: Payer: Self-pay | Admitting: *Deleted

## 2018-05-30 DIAGNOSIS — J449 Chronic obstructive pulmonary disease, unspecified: Secondary | ICD-10-CM | POA: Diagnosis not present

## 2018-05-31 NOTE — Patient Outreach (Signed)
[facility]) Care Management  05/31/2018  Tasha Clarke 1951/06/16 [id number]   Home visit post hospitalization for seizure activity, hypotension.  Pt has been home 10 days now. No further seizure activity reported. She does say she was severely dehydrated and they had a terrible time trying to get an IV started. She continues to report she doesn't feel well. This is mainly to do with her COPD. She has a productive cough but it is still hard to expectorate. She says she is using her nebulizer three times a day. She does not take any guaifenesin.   We talked at length about her goals of care today. She has documents including a MOST form and Advanced Directives I have provided previously. She is hesitate to complete the forms because she says her husband and her family will be upset if she does. She tells me she does not want to be resuscitated if she is found with no pulse or respirations. She says she never wants to be put back on the ventilator. I emphasized how important it is to put that in writing so her wishes can be followed. I have emphasized that I am willing to counsel them on this matter. She says she would prefer to stay at home and avoid hospitalization. She is in agreement to have Hospice when the time comes.  She reports her R breast nodule was determined to be a cyst and not malignant.  O: Pulse 98   Resp 20   SpO2 96%       Heart RRR      Lungs bilateral inspiratory/expiratory wheezes and rhonchi. (Pt continues to smoke, 1/2 pack a day).  A: End Stage COPD     No Advanced Directives  P:  Discussed end of life planning. Discussed palliative care and Hospice care. Offered to talk with her husband.      I will call Ia again next week.      Encouraged to get OTC guaifenesin (plain) tablets to help thin her secretions. Drink plenty of water.  She            could also take a plain saline nebs in between her medicated ones to help with this.  [place] to replace her horribly tobacco stained nebs mask and tubing, request monthly            Change.  [name]. [name], MSN, [facility] Gerontological Nurse Practitioner [facility] Care Management [phone]

## 2018-06-06 ENCOUNTER — Other Ambulatory Visit: Payer: Self-pay | Admitting: *Deleted

## 2018-06-06 NOTE — Patient Outreach (Signed)
Late entry for telephone conversation I had with Mr. Mulvaney last week after my visit with [name]. I shared with him the conversation Kynslie and I had concerning her goals of care, which were that she would prefer no CPR and only supportive measures. I reminded him that she has said she never wanted to be intubated again. I also shared that the only reason she has been hesitant to put her wishes on paper is she feels that Mr. Dietzman and her children would never accept it and would overrule her. We discussed Oliveah's disease trajectory with end stage COPD and the likeihood of being put back on a ventilator and being able to come off of it. We also discussed the difficulty families have making the decision to discontinue aggressive care in this situation.   I answered all of his questions. He was very appreciative of the conversation. I have encouraged him to have a discussion with his wife concerning her health and her wishes. I also advised him that we could get palliative care for her from [facility] out of [place]. Tavie had shared that she does not want to die at home and would prefer to go to the Hospice home in [place] at that time.  I plan on calling Mrs. Bridwell tomorrow to follow up for her transition of care.  [name]. [name], MSN, [facility] Gerontological Nurse Practitioner [facility] Care Management [phone]

## 2018-06-07 ENCOUNTER — Other Ambulatory Visit: Payer: Self-pay | Admitting: *Deleted

## 2018-06-07 NOTE — Patient Outreach (Signed)
Transition of care call. Unable to reach pt today. I left a message and requested a return call. I will be attempting to talk with her again next week.  [name]. [name], MSN, [facility] Gerontological Nurse Practitioner [facility] Care Management [phone]

## 2018-06-12 ENCOUNTER — Other Ambulatory Visit: Payer: Self-pay | Admitting: *Deleted

## 2018-06-12 NOTE — Patient Outreach (Signed)
Transition of care call. I spoke with pt's husband today. Mrs. Nightengale never answers her phone. Mr. Avis Epley said he feels that she has continually improved. They went to Mrs. Nightengale's family home for Thanksgiving and then to Mr. Ermelinda Das and she did fine.  He has not discussed her goals of care and decisions regarding whether to be a full code or just supportive measure.  We made an appt for the 3 of [place] to meet and discuss on 07/03/18.  I will call them again next week.  [name]. [name], MSN, [facility] Gerontological Nurse Practitioner [facility] Care Management [phone]

## 2018-06-14 ENCOUNTER — Inpatient Hospital Stay (HOSPITAL_COMMUNITY): Payer: Medicare PPO | Attending: Hematology

## 2018-06-14 DIAGNOSIS — G4730 Sleep apnea, unspecified: Secondary | ICD-10-CM | POA: Diagnosis not present

## 2018-06-14 DIAGNOSIS — Z9981 Dependence on supplemental oxygen: Secondary | ICD-10-CM | POA: Insufficient documentation

## 2018-06-14 DIAGNOSIS — D509 Iron deficiency anemia, unspecified: Secondary | ICD-10-CM | POA: Insufficient documentation

## 2018-06-14 DIAGNOSIS — F17210 Nicotine dependence, cigarettes, uncomplicated: Secondary | ICD-10-CM | POA: Diagnosis not present

## 2018-06-14 DIAGNOSIS — K219 Gastro-esophageal reflux disease without esophagitis: Secondary | ICD-10-CM | POA: Diagnosis not present

## 2018-06-14 DIAGNOSIS — E079 Disorder of thyroid, unspecified: Secondary | ICD-10-CM | POA: Insufficient documentation

## 2018-06-14 DIAGNOSIS — Z85828 Personal history of other malignant neoplasm of skin: Secondary | ICD-10-CM | POA: Diagnosis not present

## 2018-06-14 DIAGNOSIS — D508 Other iron deficiency anemias: Secondary | ICD-10-CM

## 2018-06-14 DIAGNOSIS — Z7982 Long term (current) use of aspirin: Secondary | ICD-10-CM | POA: Insufficient documentation

## 2018-06-14 DIAGNOSIS — F329 Major depressive disorder, single episode, unspecified: Secondary | ICD-10-CM | POA: Diagnosis not present

## 2018-06-14 DIAGNOSIS — J439 Emphysema, unspecified: Secondary | ICD-10-CM | POA: Insufficient documentation

## 2018-06-14 DIAGNOSIS — I509 Heart failure, unspecified: Secondary | ICD-10-CM | POA: Diagnosis not present

## 2018-06-14 DIAGNOSIS — E785 Hyperlipidemia, unspecified: Secondary | ICD-10-CM | POA: Insufficient documentation

## 2018-06-14 DIAGNOSIS — M7989 Other specified soft tissue disorders: Secondary | ICD-10-CM | POA: Insufficient documentation

## 2018-06-14 DIAGNOSIS — Z79899 Other long term (current) drug therapy: Secondary | ICD-10-CM | POA: Insufficient documentation

## 2018-06-14 DIAGNOSIS — E119 Type 2 diabetes mellitus without complications: Secondary | ICD-10-CM | POA: Diagnosis not present

## 2018-06-14 DIAGNOSIS — R5383 Other fatigue: Secondary | ICD-10-CM | POA: Diagnosis not present

## 2018-06-14 LAB — CBC WITH DIFFERENTIAL/PLATELET
Abs Immature Granulocytes: 0.12 10*3/uL — ABNORMAL HIGH (ref 0.00–0.07)
Basophils Absolute: 0.1 10*3/uL (ref 0.0–0.1)
Basophils Relative: 1 %
Eosinophils Absolute: 0.6 10*3/uL — ABNORMAL HIGH (ref 0.0–0.5)
Eosinophils Relative: 7 %
HCT: 37.4 % (ref 36.0–46.0)
Hemoglobin: 11.2 g/dL — ABNORMAL LOW (ref 12.0–15.0)
Immature Granulocytes: 1 %
Lymphocytes Relative: 19 %
Lymphs Abs: 1.7 10*3/uL (ref 0.7–4.0)
MCH: 28.5 pg (ref 26.0–34.0)
MCHC: 29.9 g/dL — ABNORMAL LOW (ref 30.0–36.0)
MCV: 95.2 fL (ref 80.0–100.0)
Monocytes Absolute: 0.8 10*3/uL (ref 0.1–1.0)
Monocytes Relative: 9 %
Neutro Abs: 5.7 10*3/uL (ref 1.7–7.7)
Neutrophils Relative %: 63 %
Platelets: 322 10*3/uL (ref 150–400)
RBC: 3.93 MIL/uL (ref 3.87–5.11)
RDW: 14.2 % (ref 11.5–15.5)
WBC: 9 10*3/uL (ref 4.0–10.5)
nRBC: 0 % (ref 0.0–0.2)

## 2018-06-14 LAB — COMPREHENSIVE METABOLIC PANEL
ALK PHOS: 64 U/L (ref 38–126)
ALT: 16 U/L (ref 0–44)
ANION GAP: 6 (ref 5–15)
AST: 19 U/L (ref 15–41)
Albumin: 3.7 g/dL (ref 3.5–5.0)
BUN: 24 mg/dL — ABNORMAL HIGH (ref 8–23)
CALCIUM: 9.6 mg/dL (ref 8.9–10.3)
CHLORIDE: 104 mmol/L (ref 98–111)
CO2: 25 mmol/L (ref 22–32)
Creatinine, Ser: 0.88 mg/dL (ref 0.44–1.00)
GFR calc non Af Amer: >60 mL/min (ref >60)
Glucose, Bld: 125 mg/dL — ABNORMAL HIGH (ref 70–99)
Potassium: 4.8 mmol/L (ref 3.5–5.1)
Sodium: 135 mmol/L (ref 135–145)
Total Bilirubin: 0.1 mg/dL — ABNORMAL LOW (ref 0.3–1.2)
Total Protein: 7.3 g/dL (ref 6.5–8.1)

## 2018-06-14 LAB — FERRITIN: FERRITIN: 23 ng/mL (ref 11–307)

## 2018-06-14 LAB — LACTATE DEHYDROGENASE: LDH: 134 U/L (ref 98–192)

## 2018-06-16 ENCOUNTER — Encounter: Payer: Self-pay | Admitting: Nurse Practitioner

## 2018-06-16 ENCOUNTER — Ambulatory Visit: Payer: Medicare PPO | Admitting: Nurse Practitioner

## 2018-06-16 VITALS — BP 128/69 | HR 76 | Temp 98.1°F | Ht 65.0 in | Wt 220.0 lb

## 2018-06-16 DIAGNOSIS — K921 Melena: Secondary | ICD-10-CM | POA: Diagnosis not present

## 2018-06-16 DIAGNOSIS — R1084 Generalized abdominal pain: Secondary | ICD-10-CM | POA: Diagnosis not present

## 2018-06-16 DIAGNOSIS — R197 Diarrhea, unspecified: Secondary | ICD-10-CM

## 2018-06-16 NOTE — Assessment & Plan Note (Signed)
Significant improvement in diarrhea.  Noted diarrhea and abdominal pain with dairy consumption.  Recommend she continue her current medications.  Avoid dairy products.  We had a robust discussion all dairy alternatives including soy milk, almond milk, coconut milk, other soy, almond, coconut based products as well as vegan cheese.  Return for follow-up in 6 months.  Call for any worsening symptoms before then.

## 2018-06-16 NOTE — Assessment & Plan Note (Signed)
Abdominal pain significantly improved along with diarrhea with dairy avoidance.  She does occasionally have abdominal pain although typically after consuming dairy.  We had a robust discussion on dairy alternatives, as per above.  Recommend she continue her current medications, avoid dairy, follow-up in 6 months.

## 2018-06-16 NOTE — Patient Instructions (Signed)
1. Continue taking your current medications. 2. As we discussed, avoid dairy.  There are multiple alternatives including [place] milk, soy milk, coconut milk, other almond, soy, coconut based products.  You can also check into vegan cheese. 3. Return for follow-up in 6 months. 4. Call us if you have any questions or concerns.  At [facility] we value your feedback. You may receive a survey about your visit today. Please share your experience as we strive to create trusting relationships with our patients to provide genuine, compassionate, quality care.  We appreciate your understanding and patience as we review any laboratory studies, imaging, and other diagnostic tests that are ordered as we care for you. Our office policy is 5 business days for review of these results, and any emergent or urgent results are addressed in a timely manner for your best interest. If you do not hear from our office in 1 week, please contact us.   We also encourage the use of MyChart, which contains your medical information for your review as well. If you are not enrolled in this feature, an access code is on this after visit summary for your convenience. Thank you for allowing [place] to be involved in your care.  It was great to see you today!  I hope you have a Merry Christmas!!

## 2018-06-16 NOTE — Progress Notes (Signed)
Referring Provider: Dettinger, Fransisca Kaufmann, MD Primary Care Physician:  Dettinger, Fransisca Kaufmann, MD Primary GI:  Dr. Gala Romney  Chief Complaint  Patient presents with  . Abdominal Pain    f/u; doing ok    HPI:   Tasha Clarke is a 67 y.o. female who presents for follow-up on abdominal pain and dark stools.  The patient was last seen in our office 03/10/2018 for the same as well as abnormal CT of the abdomen and diarrhea.  Her last visit was a hospital follow-up for evaluation of possible gastric mass.  Hospital admission for sepsis with septic shock, stage III kidney disease, COPD on home O2 at 2 L/min.  Her sepsis resolved with no clear source of infection but query aspiration pneumonia with recent intubation.  CT during hospitalization found gastric wall thickening along the posterior aspect of the fundus and cardia with potential polypoid extension of the gastric lumen and underlying gastric mass cannot be excluded.  At her last visit she was having abdominal pain mid to lower abdomen described as intermittent sharp, diarrhea 10-20 stools a day sometimes watery with bits in it.  Chronic diarrhea comes and goes, current symptoms started 5 months prior.  Has "pitch black, tarry stools" but then states she cannot really tell for sure.  She was on iron.  Frequent nausea and vomiting.  History of peptic ulcer disease.  Recommended stool studies, labs, EGD, request previous colonoscopy report, follow-up in 2 months.  EGD completed 03/16/2018 which found normal esophagus, congestive gastropathy, normal duodenum.  In the antrum there were somewhat deformed, deep folds but no identified infiltrating process.  Recommended resume previous diet, continue current medications.  Surgical pathology found gastric biopsies to be mild reactive gastropathy with erosion, negative for H. Pylori.  Labs completed 03/10/2018 found CBC with normal WBC count, mild anemia with hemoglobin 11.2 at baseline with normal  indices, normal platelets.  CMP essentially normal.  Stool for ova and parasite negative.  GI pathogen panel normal.  On follow-up phone call the patient stated that she is not having diarrhea like she was, some improvement.  No new symptoms.  Recommended to call for any worsening or new symptoms, follow-up as previously recommended.  Today she states she's doing muh better. Diarrhea significantly improved, rarely occurs anymore. Abdominal pain significantly improved. She noticed through observation that she would tend to have abdominal pain and diarrhea when she ate dairy products, specifically milk.  She is started avoiding dairy products and has had resultant significant improvement.  No further dark stools, no hematochezia, unintentional weight loss.  Has baseline dyspnea noted to be better than baseline today.  Denies chest pain, dizziness, lightheadedness, syncope, near syncope. Denies any other upper or lower GI symptoms.  Past Medical History:  Diagnosis Date  . Allergy   . Anemia   . Anxiety   . Arthritis   . Asthma   . Blood transfusion without reported diagnosis   . Cataract   . CHF (congestive heart failure) ([place])   . Chronic kidney disease   . Clotting disorder ([place])   . COPD (chronic obstructive pulmonary disease) ([place])   . Depression   . Diabetes mellitus without complication ([place])   . Emphysema of lung ([place])   . GERD (gastroesophageal reflux disease)   . Hyperlipidemia   . Hypertension   . Neuromuscular disorder ([place])   . Osteoporosis   . Oxygen deficiency   . Seizures ([place])   . Skin cancer  2 areas removed more than 10 years ago  . Sleep apnea 1985   wears cpap nightly  . Thyroid disease     Past Surgical History:  Procedure Laterality Date  . APPENDECTOMY    . BIOPSY  03/16/2018   Procedure: BIOPSY;  Surgeon: Daneil Dolin, MD;  [facility];  Service: Endoscopy;;  gastric bx's  . ESOPHAGOGASTRODUODENOSCOPY (EGD) WITH PROPOFOL N/A 03/16/2018    Procedure: ESOPHAGOGASTRODUODENOSCOPY (EGD) WITH PROPOFOL;  Surgeon: Daneil Dolin, MD;  [facility];  Service: Endoscopy;  Laterality: N/A;  3:00pm  . EYE SURGERY    . FRACTURE SURGERY    . heart stents    . JOINT REPLACEMENT    . SPINAL CORD STIMULATOR IMPLANT  2015  . SPINE SURGERY      Current Outpatient Medications  Medication Sig Dispense Refill  . albuterol (PROVENTIL HFA) 108 (90 Base) MCG/ACT inhaler Inhale into the lungs.    Marland Kitchen alendronate (FOSAMAX) 70 MG tablet Take 1 tablet (70 mg total) by mouth every Monday. Take with a full glass of water on an empty stomach. 13 tablet 3  . aspirin EC 81 MG tablet Take 81 mg by mouth daily.    Marland Kitchen atorvastatin (LIPITOR) 40 MG tablet Take 40 mg by mouth daily.    . buprenorphine (BUTRANS) 10 MCG/HR PTWK patch Place 10 mcg onto the skin once a week.    . butalbital-acetaminophen-caffeine (FIORICET, ESGIC) 50-325-40 MG tablet Take by mouth 2 (two) times daily as needed for headache or migraine.    . Cholecalciferol (VITAMIN D-3) 5000 units TABS Take 5,000 Units by mouth daily.    . Cyanocobalamin (VITAMIN B-12 PO) Take by mouth daily.    . cyclobenzaprine (FLEXERIL) 10 MG tablet Take 1 tablet (10 mg total) by mouth 2 (two) times daily as needed for muscle spasms. 30 tablet 1  . diphenhydramine-acetaminophen (TYLENOL PM EXTRA STRENGTH) 25-500 MG TABS tablet Take 1 tablet by mouth at bedtime as needed (pain).     . DULoxetine (CYMBALTA) 30 MG capsule Take 3 capsules (90 mg total) by mouth daily. 90 capsule 3  . EPINEPHrine 0.3 mg/0.3 mL IJ SOAJ injection Inject 0.3 mg into the muscle as needed.    . Ferrous Sulfate (IRON) 325 (65 Fe) MG TABS Take 1 tablet (325 mg total) by mouth daily. 90 each 0  . fluticasone (FLONASE) 50 MCG/ACT nasal spray Place 2 sprays into both nostrils daily. 16 g 2  . fluticasone furoate-vilanterol (BREO ELLIPTA) 200-25 MCG/INH AEPB Inhale 1 puff into the lungs daily. 28 each 2  . Fluticasone-Salmeterol (ADVAIR)  250-50 MCG/DOSE AEPB Inhale 1 puff into the lungs 2 (two) times daily. 60 each 6  . folic acid (FOLVITE) 1 MG tablet Take 1 mg by mouth daily.    Marland Kitchen gabapentin (NEURONTIN) 600 MG tablet Take 1 tablet (600 mg total) by mouth 4 (four) times daily. 120 tablet 2  . ibuprofen (ADVIL,MOTRIN) 200 MG tablet Take 200 mg by mouth every 6 (six) hours as needed.    Marland Kitchen ipratropium-albuterol (DUONEB) 0.5-2.5 (3) MG/3ML SOLN Take 3 mLs by nebulization every 6 (six) hours as needed (shortness of breathe).     . levETIRAcetam (KEPPRA) 1000 MG tablet Take 1 tablet (1,000 mg total) by mouth 2 (two) times daily. (Patient taking differently: Take 1,000 mg by mouth 2 (two) times daily. 750 morning 1000 at night) 30 tablet 2  . levothyroxine (SYNTHROID, LEVOTHROID) 50 MCG tablet Take 1 tablet (50 mcg total)  by mouth daily before breakfast. 60 tablet 0  . Magnesium 400 MG CAPS Take 400 mg by mouth daily.    . Melatonin 10 MG TABS Take by mouth.    . metoprolol tartrate (LOPRESSOR) 25 MG tablet Take 25 mg by mouth 2 (two) times daily.    . Multiple Vitamin (MULTIVITAMIN) tablet Take 1 tablet by mouth daily.    . naloxone (NARCAN) nasal spray 4 mg/0.1 mL Place into the nose.    . Omega-3 Fatty Acids (FISH OIL) 1000 MG CAPS Take 1,000 mg by mouth daily.    . potassium chloride (K-DUR) 10 MEQ tablet Take 1 tablet by mouth daily.     No current facility-administered medications for this visit.     Allergies as of 06/16/2018 - Review Complete 06/16/2018  Allergen Reaction Noted  . Cephalexin Hives 01/24/2013  . Ketorolac tromethamine Hives 07/26/2017  . Lac bovis Nausea And Vomiting 05/28/2015  . Imdur  [isosorbide dinitrate]    . Iodinated diagnostic agents  05/28/2015  . Other Other (See Comments) 01/24/2013  . Sodium bicarbonate-citric acid  08/24/2017  . Milk-related compounds Nausea And Vomiting 05/28/2015    Family History  Adopted: Yes  Problem Relation Age of Onset  . Arthritis Mother   . Asthma Mother     . Cancer Mother        lung  . Depression Mother   . Diabetes Mother   . Arthritis Father   . Asthma Father   . Depression Father   . Asthma Sister   . Depression Sister   . Diabetes Sister   . Arthritis Maternal Grandmother   . Asthma Maternal Grandmother   . Depression Maternal Grandmother   . Arthritis Maternal Grandfather   . Asthma Maternal Grandfather   . Arthritis Paternal Grandmother   . Asthma Paternal Grandmother   . Arthritis Paternal Grandfather   . Asthma Paternal Grandfather   . COPD Paternal Grandfather   . Diabetes Sister   . Mental illness Sister   . Anemia Sister   . Hypertension Son   . Post-traumatic stress disorder Son   . Allergies Daughter   . Breast cancer Neg Hx     Social History   Socioeconomic History  . Marital status: Married    Spouse name: Not on file  . Number of children: Not on file  . Years of education: Not on file  . Highest education level: Not on file  Occupational History  . Not on file  Social Needs  . Financial resource strain: Not on file  . Food insecurity:    Worry: Not on file    Inability: Not on file  . Transportation needs:    Medical: Not on file    Non-medical: Not on file  Tobacco Use  . Smoking status: Current Every Day Smoker    Packs/day: 0.75    Years: 43.00    Pack years: 32.25    Types: Cigarettes  . Smokeless tobacco: Never Used  Substance and Sexual Activity  . Alcohol use: No    Frequency: Never  . Drug use: No  . Sexual activity: Not on file  Lifestyle  . Physical activity:    Days per week: Not on file    Minutes per session: Not on file  . Stress: Not on file  Relationships  . Social connections:    Talks on phone: Not on file    Gets together: Not on file    Attends religious service: Not  on file    Active member of club or organization: Not on file    Attends meetings of clubs or organizations: Not on file    Relationship status: Not on file  Other Topics Concern  . Not on  file  Social History Narrative  . Not on file    Review of Systems: General: Negative for anorexia, weight loss, fever, chills, fatigue, weakness. ENT: Negative for hoarseness, difficulty swallowing , nasal congestion. CV: Negative for chest pain, angina, palpitations, dyspnea on exertion, peripheral edema.  Respiratory: Negative for dyspnea at rest, dyspnea on exertion, cough, sputum, wheezing.  GI: See history of present illness. Endo: Negative for unusual weight change.  Heme: Negative for bruising or bleeding.   Physical Exam: BP 128/69   Pulse 76   Temp 98.1 F (36.7 C) (Oral)   Ht 5\' 5"  (1.651 m)   Wt 220 lb (99.8 kg) Comment: approx 220 lbs per pt, unable to stand on scale  BMI 36.61 kg/m  General:   Alert and oriented. Pleasant and cooperative. Well-nourished and well-developed.  Eyes:  Without icterus, sclera clear and conjunctiva pink.  Ears:  Normal auditory acuity. Cardiovascular:  S1, S2 present without murmurs appreciated. Extremities without clubbing or edema. Respiratory:  Clear to auscultation bilaterally. No wheezes, rales, or rhonchi. No distress. [place] O2 per nasal cannular. Gastrointestinal:  +BS, soft, non-tender and non-distended. No HSM noted. No guarding or rebound. No masses appreciated.  Rectal:  Deferred  Musculoskalatal:  Symmetrical without gross deformities. Neurologic:  Alert and oriented x4;  grossly normal neurologically. Psych:  Alert and cooperative. Normal mood and affect. Heme/Lymph/Immune: No excessive bruising noted.    06/16/2018 10:44 AM   Disclaimer: This note was dictated with voice recognition software. Similar sounding words can inadvertently be transcribed and may not be corrected upon review.

## 2018-06-16 NOTE — Assessment & Plan Note (Signed)
Currently resolved.  Continue to monitor.  EGD up-to-date and essentially normal.  Follow-up in 6 months.

## 2018-06-19 ENCOUNTER — Other Ambulatory Visit: Payer: Self-pay | Admitting: *Deleted

## 2018-06-19 NOTE — Progress Notes (Signed)
cc'd to pcp 

## 2018-06-20 NOTE — Patient Outreach (Signed)
Transition of care call attempted. I left a voice mail and requested a return call.  Tasha Clarke. [name], MSN, [facility] Gerontological Nurse Practitioner [facility] Care Management [phone]

## 2018-06-21 ENCOUNTER — Encounter (HOSPITAL_COMMUNITY): Payer: Self-pay | Admitting: Hematology

## 2018-06-21 ENCOUNTER — Inpatient Hospital Stay (HOSPITAL_COMMUNITY): Payer: Medicare PPO | Admitting: Hematology

## 2018-06-21 VITALS — BP 108/72 | HR 68 | Temp 98.7°F | Resp 18

## 2018-06-21 DIAGNOSIS — Z79899 Other long term (current) drug therapy: Secondary | ICD-10-CM | POA: Diagnosis not present

## 2018-06-21 DIAGNOSIS — R5383 Other fatigue: Secondary | ICD-10-CM

## 2018-06-21 DIAGNOSIS — Z7982 Long term (current) use of aspirin: Secondary | ICD-10-CM

## 2018-06-21 DIAGNOSIS — K219 Gastro-esophageal reflux disease without esophagitis: Secondary | ICD-10-CM

## 2018-06-21 DIAGNOSIS — E079 Disorder of thyroid, unspecified: Secondary | ICD-10-CM

## 2018-06-21 DIAGNOSIS — E785 Hyperlipidemia, unspecified: Secondary | ICD-10-CM

## 2018-06-21 DIAGNOSIS — F17210 Nicotine dependence, cigarettes, uncomplicated: Secondary | ICD-10-CM

## 2018-06-21 DIAGNOSIS — F329 Major depressive disorder, single episode, unspecified: Secondary | ICD-10-CM | POA: Diagnosis not present

## 2018-06-21 DIAGNOSIS — Z85828 Personal history of other malignant neoplasm of skin: Secondary | ICD-10-CM

## 2018-06-21 DIAGNOSIS — D509 Iron deficiency anemia, unspecified: Secondary | ICD-10-CM

## 2018-06-21 DIAGNOSIS — E119 Type 2 diabetes mellitus without complications: Secondary | ICD-10-CM

## 2018-06-21 DIAGNOSIS — Z9981 Dependence on supplemental oxygen: Secondary | ICD-10-CM

## 2018-06-21 DIAGNOSIS — D508 Other iron deficiency anemias: Secondary | ICD-10-CM

## 2018-06-21 DIAGNOSIS — I509 Heart failure, unspecified: Secondary | ICD-10-CM

## 2018-06-21 DIAGNOSIS — J439 Emphysema, unspecified: Secondary | ICD-10-CM

## 2018-06-21 DIAGNOSIS — M7989 Other specified soft tissue disorders: Secondary | ICD-10-CM

## 2018-06-21 DIAGNOSIS — G4730 Sleep apnea, unspecified: Secondary | ICD-10-CM

## 2018-06-21 NOTE — Assessment & Plan Note (Signed)
1.  Normocytic anemia: - Hospital admission in February 2019 with 2 units of blood transfusion.  -Stool was tested negative for occult blood on 08/25/2017.  Reportedly had a colonoscopy 3 years ago which was normal.  This was done at an outside facility. - She received last Feraheme infusion on 01/05/2018. -She has been on oral iron therapy for long time.  She is currently taking 1 tablet daily.  She was recently hospitalized for seizure-like activity. -We reviewed her blood work today.  Hemoglobin is 11.2.  Ferritin dropped to 25.  I have recommended 2 more infusions of Feraheme 1 week apart.  She will be scheduled for it. -I will see her back in 4 months for follow-up.

## 2018-06-21 NOTE — Progress Notes (Signed)
Tasha Clarke, [place] [address]   CLINIC:  Medical Oncology/Hematology  PCP:  Dettinger, Fransisca Kaufmann, MD [place] [address] [phone]   REASON FOR VISIT: Follow-up for iron deficiency anemia.   CURRENT THERAPY: intermittent iron infusions   INTERVAL HISTORY:  Tasha Clarke 67 y.o. female returns for routine follow-up for iron deficiency anemia. She fatigued and SOB and unable to do more activities that she wants. She wears continuous home oxygen. She can not walk long distances and is in need of a wheelchair for appointments and other out of the house activities. She lives at home with her family and they help her with her ADLs. She denies any bleeding or easy bruising. Denies any new pains. Denies any headaches or vision changes. Denies any chest pains. She reports her appetite at 50% and her energy level 25%. She has no problem maintaining his weight.    REVIEW OF SYSTEMS:  Review of Systems  Constitutional: Positive for fatigue.  Cardiovascular: Positive for leg swelling.  Musculoskeletal: Positive for back pain.  All other systems reviewed and are negative.    PAST MEDICAL/SURGICAL HISTORY:  Past Medical History:  Diagnosis Date  . Allergy   . Anemia   . Anxiety   . Arthritis   . Asthma   . Blood transfusion without reported diagnosis   . Cataract   . CHF (congestive heart failure) ([place])   . Chronic kidney disease   . Clotting disorder ([place])   . COPD (chronic obstructive pulmonary disease) ([place])   . Depression   . Diabetes mellitus without complication ([place])   . Emphysema of lung ([place])   . GERD (gastroesophageal reflux disease)   . Hyperlipidemia   . Hypertension   . Neuromuscular disorder ([place])   . Osteoporosis   . Oxygen deficiency   . Seizures ([place])   . Skin cancer    2 areas removed more than 10 years ago  . Sleep apnea 1985   wears cpap nightly  . Thyroid disease    Past Surgical History:    Procedure Laterality Date  . APPENDECTOMY    . BIOPSY  03/16/2018   Procedure: BIOPSY;  Surgeon: Daneil Dolin, MD;  [facility];  Service: Endoscopy;;  gastric bx's  . ESOPHAGOGASTRODUODENOSCOPY (EGD) WITH PROPOFOL N/A 03/16/2018   Procedure: ESOPHAGOGASTRODUODENOSCOPY (EGD) WITH PROPOFOL;  Surgeon: Daneil Dolin, MD;  [facility];  Service: Endoscopy;  Laterality: N/A;  3:00pm  . EYE SURGERY    . FRACTURE SURGERY    . heart stents    . JOINT REPLACEMENT    . SPINAL CORD STIMULATOR IMPLANT  2015  . SPINE SURGERY       SOCIAL HISTORY:  Social History   Socioeconomic History  . Marital status: Married    Spouse name: Not on file  . Number of children: Not on file  . Years of education: Not on file  . Highest education level: Not on file  Occupational History  . Not on file  Social Needs  . Financial resource strain: Not on file  . Food insecurity:    Worry: Not on file    Inability: Not on file  . Transportation needs:    Medical: Not on file    Non-medical: Not on file  Tobacco Use  . Smoking status: Current Every Day Smoker    Packs/day: 0.75    Years: 43.00    Pack years: 32.25  Types: Cigarettes  . Smokeless tobacco: Never Used  Substance and Sexual Activity  . Alcohol use: No    Frequency: Never  . Drug use: No  . Sexual activity: Not on file  Lifestyle  . Physical activity:    Days per week: Not on file    Minutes per session: Not on file  . Stress: Not on file  Relationships  . Social connections:    Talks on phone: Not on file    Gets together: Not on file    Attends religious service: Not on file    Active member of club or organization: Not on file    Attends meetings of clubs or organizations: Not on file    Relationship status: Not on file  . Intimate partner violence:    Fear of current or ex partner: Not on file    Emotionally abused: Not on file    Physically abused: Not on file    Forced sexual activity: Not on  file  Other Topics Concern  . Not on file  Social History Narrative  . Not on file    FAMILY HISTORY:  Family History  Adopted: Yes  Problem Relation Age of Onset  . Arthritis Mother   . Asthma Mother   . Cancer Mother        lung  . Depression Mother   . Diabetes Mother   . Arthritis Father   . Asthma Father   . Depression Father   . Asthma Sister   . Depression Sister   . Diabetes Sister   . Arthritis Maternal Grandmother   . Asthma Maternal Grandmother   . Depression Maternal Grandmother   . Arthritis Maternal Grandfather   . Asthma Maternal Grandfather   . Arthritis Paternal Grandmother   . Asthma Paternal Grandmother   . Arthritis Paternal Grandfather   . Asthma Paternal Grandfather   . COPD Paternal Grandfather   . Diabetes Sister   . Mental illness Sister   . Anemia Sister   . Hypertension Son   . Post-traumatic stress disorder Son   . Allergies Daughter   . Breast cancer Neg Hx     CURRENT MEDICATIONS:  Outpatient Encounter Medications as of 06/21/2018  Medication Sig Note  . albuterol (PROVENTIL HFA) 108 (90 Base) MCG/ACT inhaler Inhale into the lungs.   Marland Kitchen alendronate (FOSAMAX) 70 MG tablet Take 1 tablet (70 mg total) by mouth every Monday. Take with a full glass of water on an empty stomach.   Marland Kitchen aspirin EC 81 MG tablet Take 81 mg by mouth daily.   Marland Kitchen atorvastatin (LIPITOR) 40 MG tablet Take 40 mg by mouth daily.   . buprenorphine (BUTRANS) 10 MCG/HR PTWK patch Place 10 mcg onto the skin once a week.   . butalbital-acetaminophen-caffeine (FIORICET, ESGIC) 50-325-40 MG tablet Take by mouth 2 (two) times daily as needed for headache or migraine.   . Cholecalciferol (VITAMIN D-3) 5000 units TABS Take 5,000 Units by mouth daily.   . Cyanocobalamin (VITAMIN B-12 PO) Take by mouth daily.   . cyclobenzaprine (FLEXERIL) 10 MG tablet Take 1 tablet (10 mg total) by mouth 2 (two) times daily as needed for muscle spasms.   . diphenhydramine-acetaminophen (TYLENOL PM  EXTRA STRENGTH) 25-500 MG TABS tablet Take 1 tablet by mouth at bedtime as needed (pain).    . DULoxetine (CYMBALTA) 30 MG capsule Take 3 capsules (90 mg total) by mouth daily.   Marland Kitchen EPINEPHrine 0.3 mg/0.3 mL IJ SOAJ injection Inject 0.3  mg into the muscle as needed. 03/28/2018: Has an epipen. Is allergic to bees.  . Ferrous Sulfate (IRON) 325 (65 Fe) MG TABS Take 1 tablet (325 mg total) by mouth daily.   . fluticasone (FLONASE) 50 MCG/ACT nasal spray Place 2 sprays into both nostrils daily.   . fluticasone furoate-vilanterol (BREO ELLIPTA) 200-25 MCG/INH AEPB Inhale 1 puff into the lungs daily.   . Fluticasone-Salmeterol (ADVAIR) 250-50 MCG/DOSE AEPB Inhale 1 puff into the lungs 2 (two) times daily.   . folic acid (FOLVITE) 1 MG tablet Take 1 mg by mouth daily.   Marland Kitchen gabapentin (NEURONTIN) 600 MG tablet Take 1 tablet (600 mg total) by mouth 4 (four) times daily.   Marland Kitchen ibuprofen (ADVIL,MOTRIN) 200 MG tablet Take 200 mg by mouth every 6 (six) hours as needed.   Marland Kitchen ipratropium-albuterol (DUONEB) 0.5-2.5 (3) MG/3ML SOLN Take 3 mLs by nebulization every 6 (six) hours as needed (shortness of breathe).    . levETIRAcetam (KEPPRA) 1000 MG tablet Take 1 tablet (1,000 mg total) by mouth 2 (two) times daily. (Patient taking differently: Take 1,000 mg by mouth 2 (two) times daily. 750 morning 1000 at night)   . levothyroxine (SYNTHROID, LEVOTHROID) 50 MCG tablet Take 1 tablet (50 mcg total) by mouth daily before breakfast.   . Magnesium 400 MG CAPS Take 400 mg by mouth daily.   . Melatonin 10 MG TABS Take by mouth.   . metoprolol tartrate (LOPRESSOR) 25 MG tablet Take 25 mg by mouth 2 (two) times daily.   . Multiple Vitamin (MULTIVITAMIN) tablet Take 1 tablet by mouth daily.   . naloxone (NARCAN) nasal spray 4 mg/0.1 mL Place into the nose. 03/28/2018: Has but has never needed to use it.  . Omega-3 Fatty Acids (FISH OIL) 1000 MG CAPS Take 1,000 mg by mouth daily.   . potassium chloride (K-DUR) 10 MEQ tablet Take 1  tablet by mouth daily.    No facility-administered encounter medications on file as of 06/21/2018.     ALLERGIES:  Allergies  Allergen Reactions  . Cephalexin Hives  . Ketorolac Tromethamine Hives  . Lac Bovis Nausea And Vomiting  . Imdur  [Isosorbide Dinitrate]   . Iodinated Diagnostic Agents   . Other Other (See Comments)    Alka seltzer causes blurred vision, fainting  . Sodium Bicarbonate-Citric Acid   . Milk-Related Compounds Nausea And Vomiting     PHYSICAL EXAM:  ECOG Performance status: 1  Vitals:   06/21/18 1453  BP: 108/72  Pulse: 68  Resp: 18  Temp: 98.7 F (37.1 C)  SpO2: 99%   Filed Weights    Physical Exam  Constitutional: She is oriented to person, place, and time. She appears well-developed and well-nourished.  Cardiovascular: Normal rate, regular rhythm and normal heart sounds.  Pulmonary/Chest: She has wheezes.  Musculoskeletal:  wheelchair  Neurological: She is alert and oriented to person, place, and time.  Skin: Skin is warm and dry.  Psychiatric: She has a normal mood and affect. Her behavior is normal. Judgment and thought content normal.     LABORATORY DATA:  I have reviewed the labs as listed.  CBC    Component Value Date/Time   WBC 9.0 06/14/2018 1522   RBC 3.93 06/14/2018 1522   HGB 11.2 (L) 06/14/2018 1522   HGB 11.4 05/24/2018 1012   HCT 37.4 06/14/2018 1522   HCT 34.8 05/24/2018 1012   PLT 322 06/14/2018 1522   PLT 346 05/24/2018 1012   MCV 95.2 06/14/2018 1522  MCV 89 05/24/2018 1012   MCH 28.5 06/14/2018 1522   MCHC 29.9 (L) 06/14/2018 1522   RDW 14.2 06/14/2018 1522   RDW 12.8 05/24/2018 1012   LYMPHSABS 1.7 06/14/2018 1522   LYMPHSABS 2.1 05/24/2018 1012   MONOABS 0.8 06/14/2018 1522   EOSABS 0.6 (H) 06/14/2018 1522   EOSABS 0.6 (H) 05/24/2018 1012   BASOSABS 0.1 06/14/2018 1522   BASOSABS 0.1 05/24/2018 1012   CMP Latest Ref Rng & Units 06/14/2018 05/24/2018 05/18/2018  Glucose 70 - 99 mg/dL 125(H) 87 87    BUN 8 - 23 mg/dL 24(H) 21 15  Creatinine 0.44 - 1.00 mg/dL 0.88 1.02(H) 0.62  Sodium 135 - 145 mmol/L 135 135 137  Potassium 3.5 - 5.1 mmol/L 4.8 5.0 4.7  Chloride 98 - 111 mmol/L 104 99 112(H)  CO2 22 - 32 mmol/L 25 20 17(L)  Calcium 8.9 - 10.3 mg/dL 9.6 10.1 7.9(L)  Total Protein 6.5 - 8.1 g/dL 7.3 6.7 -  Total Bilirubin 0.3 - 1.2 mg/dL 0.1(L) <0.2 -  Alkaline Phos 38 - 126 U/L 64 86 -  AST 15 - 41 U/L 19 19 -  ALT 0 - 44 U/L 16 14 -      I have reviewed Francene Finders, NP's note and agree with the documentation.  I personally performed a face-to-face visit, made revisions and my assessment and plan is as follows.       ASSESSMENT & PLAN:   Iron deficiency anemia 1.  Normocytic anemia: - Hospital admission in February 2019 with 2 units of blood transfusion.  -Stool was tested negative for occult blood on 08/25/2017.  Reportedly had a colonoscopy 3 years ago which was normal.  This was done at an outside facility. - She received last Feraheme infusion on 01/05/2018. -She has been on oral iron therapy for long time.  She is currently taking 1 tablet daily.  She was recently hospitalized for seizure-like activity. -We reviewed her blood work today.  Hemoglobin is 11.2.  Ferritin dropped to 25.  I have recommended 2 more infusions of Feraheme 1 week apart.  She will be scheduled for it. -I will see her back in 4 months for follow-up.      Orders placed this encounter:  Orders Placed This Encounter  Procedures  . Lactate dehydrogenase  . CBC with Differential/Platelet  . Comprehensive metabolic panel  . Ferritin  . Iron and TIBC  . Vitamin B12  . Folate      Derek Jack, MD [place] [phone]

## 2018-06-21 NOTE — Patient Instructions (Signed)
[facility] Discharge Instructions  Follow up in 4 months with labs prior to your visit.    Thank you for choosing [facility] at [facility] to provide your oncology and hematology care.  To afford each patient quality time with our provider, please arrive at least 15 minutes before your scheduled appointment time.   If you have a lab appointment with the Cancer Center please come in thru the  Main Entrance and check in at the main information desk  You need to re-schedule your appointment should you arrive 10 or more minutes late.  We strive to give you quality time with our providers, and arriving late affects you and other patients whose appointments are after yours.  Also, if you no show three or more times for appointments you may be dismissed from the clinic at the providers discretion.     Again, thank you for choosing [facility].  Our hope is that these requests will decrease the amount of time that you wait before being seen by our physicians.       _____________________________________________________________  Should you have questions after your visit to [facility], please contact our office at [phone] between the hours of 8:00 a.m. and 4:30 p.m.  Voicemails left after 4:00 p.m. will not be returned until the following business day.  For prescription refill requests, have your pharmacy contact our office and allow 72 hours.    Cancer Center Support Programs:   > Cancer Support Group  2nd Tuesday of the month 1pm-2pm, Journey Room    

## 2018-06-24 DIAGNOSIS — J449 Chronic obstructive pulmonary disease, unspecified: Secondary | ICD-10-CM | POA: Diagnosis not present

## 2018-06-26 ENCOUNTER — Other Ambulatory Visit: Payer: Self-pay | Admitting: *Deleted

## 2018-06-26 NOTE — Patient Outreach (Signed)
Final transition of care call. I spoke with pt's husband today, who reports Lucca is doing OK. She went to see her hematologist last week and her ferritin level is low. She is scheduled for an iron infusion this Friday. He reports she has not had any seizures. Her respiratory status is stable. She does not use her home O2. She continues to smoke.  I inquired if he and Antonisha have talked about her plan of care at the end of life and he says he has not. I have informed him the representative from [facility] will be coming out to do a joint visit with me on Dec 23rd at 10:30 am. I have asked him to advise Siham of this. Also Gaspar Bidding states Adamary's son is living with them now so I have advised him it is also very important to share Bemnet's wishes with her children. He states he will talk to [place].  I have reaffirmed that home visit date and time with [name].  I will call Gaspar Bidding before we arrive on that day to let him know we are on our way.  [name]. [name], MSN, [facility] Gerontological Nurse Practitioner [facility] Care Management [phone]

## 2018-06-27 DIAGNOSIS — J449 Chronic obstructive pulmonary disease, unspecified: Secondary | ICD-10-CM | POA: Diagnosis not present

## 2018-06-27 DIAGNOSIS — J9611 Chronic respiratory failure with hypoxia: Secondary | ICD-10-CM | POA: Diagnosis not present

## 2018-06-27 DIAGNOSIS — I10 Essential (primary) hypertension: Secondary | ICD-10-CM | POA: Diagnosis not present

## 2018-06-27 DIAGNOSIS — F17200 Nicotine dependence, unspecified, uncomplicated: Secondary | ICD-10-CM | POA: Diagnosis not present

## 2018-06-29 ENCOUNTER — Encounter (HOSPITAL_COMMUNITY): Payer: Self-pay | Admitting: Hematology

## 2018-06-30 ENCOUNTER — Other Ambulatory Visit: Payer: Self-pay

## 2018-06-30 ENCOUNTER — Inpatient Hospital Stay (HOSPITAL_COMMUNITY): Payer: Medicare PPO

## 2018-06-30 ENCOUNTER — Encounter (HOSPITAL_COMMUNITY): Payer: Self-pay

## 2018-06-30 DIAGNOSIS — M7989 Other specified soft tissue disorders: Secondary | ICD-10-CM | POA: Diagnosis not present

## 2018-06-30 DIAGNOSIS — Z79899 Other long term (current) drug therapy: Secondary | ICD-10-CM | POA: Diagnosis not present

## 2018-06-30 DIAGNOSIS — I509 Heart failure, unspecified: Secondary | ICD-10-CM | POA: Diagnosis not present

## 2018-06-30 DIAGNOSIS — D509 Iron deficiency anemia, unspecified: Secondary | ICD-10-CM | POA: Diagnosis not present

## 2018-06-30 DIAGNOSIS — K219 Gastro-esophageal reflux disease without esophagitis: Secondary | ICD-10-CM | POA: Diagnosis not present

## 2018-06-30 DIAGNOSIS — E119 Type 2 diabetes mellitus without complications: Secondary | ICD-10-CM | POA: Diagnosis not present

## 2018-06-30 DIAGNOSIS — F329 Major depressive disorder, single episode, unspecified: Secondary | ICD-10-CM | POA: Diagnosis not present

## 2018-06-30 DIAGNOSIS — J439 Emphysema, unspecified: Secondary | ICD-10-CM | POA: Diagnosis not present

## 2018-06-30 DIAGNOSIS — R5383 Other fatigue: Secondary | ICD-10-CM | POA: Diagnosis not present

## 2018-06-30 MED ORDER — SODIUM CHLORIDE 0.9 % IV SOLN
INTRAVENOUS | Status: DC
  Administered 2018-06-30: 15:00:00 via INTRAVENOUS

## 2018-06-30 MED ORDER — SODIUM CHLORIDE 0.9 % IV SOLN
510.0000 mg | Freq: Once | INTRAVENOUS | Status: AC
  Administered 2018-06-30: 510 mg via INTRAVENOUS
  Filled 2018-06-30: qty 17

## 2018-06-30 NOTE — Patient Instructions (Signed)
[place] at [facility] Discharge Instructions [place] at [facility] Discharge Instructions     Thank you for choosing [place] at [facility] to provide your oncology and hematology care.  To afford each patient quality time with our provider, please arrive at least 15 minutes before your scheduled appointment time.   If you have a lab appointment with the [place] please come in thru the  Main Entrance and check in at the main information desk  You need to re-schedule your appointment should you arrive 10 or more minutes late.  We strive to give you quality time with our providers, and arriving late affects you and other patients whose appointments are after yours.  Also, if you no show three or more times for appointments you may be dismissed from the clinic at the providers discretion.     Again, thank you for choosing [facility].  Our hope is that these requests will decrease the amount of time that you wait before being seen by our physicians.       _____________________________________________________________  Should you have questions after your visit to [facility], please contact our office at [phone] between the hours of 8:00 a.m. and 4:30 p.m.  Voicemails left after 4:00 p.m. will not be returned until the following business day.  For prescription refill requests, have your pharmacy contact our office and allow 72 hours.    Cancer Center Support Programs:   > Cancer Support Group  2nd Tuesday of the month 1pm-2pm, Journey Room     Thank you for choosing [place] at [facility] to provide your oncology and hematology care.  To afford each patient quality time with our provider, please arrive at least 15 minutes before your scheduled appointment time.   If you have a lab appointment with the [place] please come in thru the  Main  Entrance and check in at the main information desk  You need to re-schedule your appointment should you arrive 10 or more minutes late.  We strive to give you quality time with our providers, and arriving late affects you and other patients whose appointments are after yours.  Also, if you no show three or more times for appointments you may be dismissed from the clinic at the providers discretion.     Again, thank you for choosing [facility].  Our hope is that these requests will decrease the amount of time that you wait before being seen by our physicians.       _____________________________________________________________  Should you have questions after your visit to [facility], please contact our office at [phone] between the hours of 8:00 a.m. and 4:30 p.m.  Voicemails left after 4:00 p.m. will not be returned until the following business day.  For prescription refill requests, have your pharmacy contact our office and allow 72 hours.    Cancer Center Support Programs:   > Cancer Support Group  2nd Tuesday of the month 1pm-2pm, Journey Room

## 2018-06-30 NOTE — Progress Notes (Signed)
feraheme given today per orders. Patient tolerated it well without problems. Vitals stable and discharged home from clinic ambulatory. Follow up as scheduled.

## 2018-07-03 ENCOUNTER — Other Ambulatory Visit: Payer: Self-pay | Admitting: *Deleted

## 2018-07-03 NOTE — Patient Outreach (Addendum)
[facility]) Care Management  07/03/2018  Tasha Clarke 1951/04/17 [id number]  Home visit with Maurice Small, NP of [facility], pt and husband. Today Tasha Clarke feels pretty good. She has gotten out of bed and come to the dining table to meet with everyone. She is stable, no new problems to report today.  O: BP 130/80   Pulse 73   Resp 20   SpO2 95% Comment: room air.      RRR      Lungs with bilateral ronchi, deep breathing stimulates coughing.      No peripheral edema.  A: Multiple co-morbidites: COPD, Seizures, Chronic pain, Iron Deficiency Anemia  Anderson Malta introduced [facility] and explained their services in a very basic supportive way. Both pt and husband feel that this is a good time to start receiving this service.  Anderson Malta and I both approached the topic of completing pt Advanced Directives and MOST form. Pt and husband asked questions and discussed among themselves. Mr. Randell Loop agrees to support [name]'s wish for NO CPR, NO AGGRESSIVE LIFE SAVING MEASURES. Pt completed a MOST form reflecting these decisions. We competed a new Living Will which reflects her current wishes. She needs to have this notorized ASAP and provide her MDs copies.  P: [facility] will start seeing pt after the holidays for [place].      I  Will see Tasha Clarke one more time in one month and close her case providing there is      agreement by all parties that the transition of going smoothly.  [facility] CM Care Plan Problem One     Most Recent Value  Care Plan Problem One  No Advanced Directives  Role Documenting the Problem One  Care Management Coordinator  Care Plan for Problem One  Active  [facility] CM Short Term Goal #1   Will have family meeting to discuss pt goals of care and to complete her Advanced Directives  [facility] CM Short Term Goal #1 Start Date  05/30/18  [facility] CM Short Term Goal #1 Met Date  07/03/18  Interventions for Short Term Goal #1  MOST form  completed (DNR), new living will which will need to be notorized and copy to MD.  [facility] CM Short Term Goal #2   Pt will complete the Advanced Directives within the next 30 days.  [facility] CM Short Term Goal #2 Start Date  06/26/18  [facility] CM Short Term Goal #2 Met Date  07/03/18  Interventions for Short Term Goal #2  See comments above.     DR.DETTINGER WILL YOU PLEASE ADD AN ORDER FOR A DNR IN PT CHART, THANK YOU!  [name]. [name], MSN, [facility] Gerontological Nurse Practitioner [facility] Care Management [phone]

## 2018-07-06 DIAGNOSIS — M47812 Spondylosis without myelopathy or radiculopathy, cervical region: Secondary | ICD-10-CM | POA: Diagnosis not present

## 2018-07-06 DIAGNOSIS — G894 Chronic pain syndrome: Secondary | ICD-10-CM | POA: Diagnosis not present

## 2018-07-06 DIAGNOSIS — F1120 Opioid dependence, uncomplicated: Secondary | ICD-10-CM | POA: Diagnosis not present

## 2018-07-08 ENCOUNTER — Other Ambulatory Visit: Payer: Self-pay | Admitting: Family Medicine

## 2018-07-10 ENCOUNTER — Inpatient Hospital Stay (HOSPITAL_COMMUNITY): Payer: Medicare PPO

## 2018-07-10 ENCOUNTER — Other Ambulatory Visit: Payer: Self-pay

## 2018-07-10 ENCOUNTER — Encounter (HOSPITAL_COMMUNITY): Payer: Self-pay

## 2018-07-10 VITALS — BP 128/65 | HR 76 | Temp 98.7°F | Resp 18

## 2018-07-10 DIAGNOSIS — F329 Major depressive disorder, single episode, unspecified: Secondary | ICD-10-CM | POA: Diagnosis not present

## 2018-07-10 DIAGNOSIS — G4733 Obstructive sleep apnea (adult) (pediatric): Secondary | ICD-10-CM | POA: Diagnosis not present

## 2018-07-10 DIAGNOSIS — F17200 Nicotine dependence, unspecified, uncomplicated: Secondary | ICD-10-CM | POA: Diagnosis not present

## 2018-07-10 DIAGNOSIS — G8929 Other chronic pain: Secondary | ICD-10-CM | POA: Diagnosis not present

## 2018-07-10 DIAGNOSIS — I10 Essential (primary) hypertension: Secondary | ICD-10-CM | POA: Diagnosis not present

## 2018-07-10 DIAGNOSIS — R5383 Other fatigue: Secondary | ICD-10-CM | POA: Diagnosis not present

## 2018-07-10 DIAGNOSIS — F331 Major depressive disorder, recurrent, moderate: Secondary | ICD-10-CM | POA: Diagnosis not present

## 2018-07-10 DIAGNOSIS — M7989 Other specified soft tissue disorders: Secondary | ICD-10-CM | POA: Diagnosis not present

## 2018-07-10 DIAGNOSIS — E119 Type 2 diabetes mellitus without complications: Secondary | ICD-10-CM | POA: Diagnosis not present

## 2018-07-10 DIAGNOSIS — G40909 Epilepsy, unspecified, not intractable, without status epilepticus: Secondary | ICD-10-CM | POA: Diagnosis not present

## 2018-07-10 DIAGNOSIS — Z79899 Other long term (current) drug therapy: Secondary | ICD-10-CM | POA: Diagnosis not present

## 2018-07-10 DIAGNOSIS — K219 Gastro-esophageal reflux disease without esophagitis: Secondary | ICD-10-CM | POA: Diagnosis not present

## 2018-07-10 DIAGNOSIS — E039 Hypothyroidism, unspecified: Secondary | ICD-10-CM | POA: Diagnosis not present

## 2018-07-10 DIAGNOSIS — D508 Other iron deficiency anemias: Secondary | ICD-10-CM

## 2018-07-10 DIAGNOSIS — D509 Iron deficiency anemia, unspecified: Secondary | ICD-10-CM | POA: Diagnosis not present

## 2018-07-10 DIAGNOSIS — J439 Emphysema, unspecified: Secondary | ICD-10-CM | POA: Diagnosis not present

## 2018-07-10 DIAGNOSIS — G4700 Insomnia, unspecified: Secondary | ICD-10-CM | POA: Diagnosis not present

## 2018-07-10 DIAGNOSIS — I509 Heart failure, unspecified: Secondary | ICD-10-CM | POA: Diagnosis not present

## 2018-07-10 DIAGNOSIS — H9190 Unspecified hearing loss, unspecified ear: Secondary | ICD-10-CM | POA: Diagnosis not present

## 2018-07-10 MED ORDER — SODIUM CHLORIDE 0.9 % IV SOLN
510.0000 mg | Freq: Once | INTRAVENOUS | Status: AC
  Administered 2018-07-10: 510 mg via INTRAVENOUS
  Filled 2018-07-10: qty 17

## 2018-07-10 MED ORDER — SODIUM CHLORIDE 0.9 % IV SOLN
Freq: Once | INTRAVENOUS | Status: AC
  Administered 2018-07-10: 15:00:00 via INTRAVENOUS

## 2018-07-10 NOTE — Progress Notes (Signed)
Will add once signed by patient at next visit

## 2018-07-10 NOTE — Progress Notes (Signed)
Tolerated infusion w/o adverse reaction.  Alert, in no distress.  VSS.  Discharged via wheelchair in c/o spouse.  

## 2018-07-10 NOTE — Telephone Encounter (Signed)
Last seen 05/24/18 

## 2018-07-10 NOTE — Progress Notes (Signed)
Can we get a DNR added to this patient's chart and we can have her sign it.

## 2018-07-11 ENCOUNTER — Other Ambulatory Visit: Payer: Self-pay | Admitting: Acute Care

## 2018-07-11 DIAGNOSIS — Z87891 Personal history of nicotine dependence: Secondary | ICD-10-CM

## 2018-07-11 DIAGNOSIS — F17210 Nicotine dependence, cigarettes, uncomplicated: Secondary | ICD-10-CM

## 2018-07-11 DIAGNOSIS — Z122 Encounter for screening for malignant neoplasm of respiratory organs: Secondary | ICD-10-CM

## 2018-07-17 ENCOUNTER — Ambulatory Visit (HOSPITAL_COMMUNITY)
Admission: RE | Admit: 2018-07-17 | Discharge: 2018-07-17 | Disposition: A | Discharge status: Home / Self Care | Payer: Medicare PPO | Source: Ambulatory Visit | Attending: Family Medicine | Admitting: Family Medicine

## 2018-07-17 DIAGNOSIS — J449 Chronic obstructive pulmonary disease, unspecified: Secondary | ICD-10-CM | POA: Diagnosis not present

## 2018-07-17 DIAGNOSIS — E785 Hyperlipidemia, unspecified: Secondary | ICD-10-CM | POA: Insufficient documentation

## 2018-07-17 DIAGNOSIS — G894 Chronic pain syndrome: Secondary | ICD-10-CM | POA: Diagnosis not present

## 2018-07-17 DIAGNOSIS — R7989 Other specified abnormal findings of blood chemistry: Secondary | ICD-10-CM | POA: Insufficient documentation

## 2018-07-17 DIAGNOSIS — I119 Hypertensive heart disease without heart failure: Secondary | ICD-10-CM | POA: Insufficient documentation

## 2018-07-17 NOTE — Progress Notes (Signed)
*  PRELIMINARY RESULTS* Echocardiogram 2D Echocardiogram has been performed.  Tasha Clarke 07/17/2018, 10:28 AM

## 2018-07-25 DIAGNOSIS — J449 Chronic obstructive pulmonary disease, unspecified: Secondary | ICD-10-CM | POA: Diagnosis not present

## 2018-07-26 ENCOUNTER — Ambulatory Visit: Payer: Medicare PPO | Admitting: Family Medicine

## 2018-07-26 ENCOUNTER — Encounter: Payer: Self-pay | Admitting: Family Medicine

## 2018-07-26 VITALS — BP 138/86 | HR 70 | Temp 98.5°F

## 2018-07-26 DIAGNOSIS — I2510 Atherosclerotic heart disease of native coronary artery without angina pectoris: Secondary | ICD-10-CM | POA: Diagnosis not present

## 2018-07-26 DIAGNOSIS — R55 Syncope and collapse: Secondary | ICD-10-CM

## 2018-07-26 DIAGNOSIS — R5383 Other fatigue: Secondary | ICD-10-CM

## 2018-07-26 MED ORDER — GABAPENTIN 600 MG PO TABS: 600.0000 mg | ORAL_TABLET | Freq: Four times a day (QID) | ORAL | 3 refills | Status: DC

## 2018-07-26 NOTE — Patient Instructions (Signed)
Cut patient's metoprolol in half and take only 12.5 mg twice daily

## 2018-07-26 NOTE — Progress Notes (Signed)
BP 138/86   Pulse 70   Temp 98.5 F (36.9 C) (Oral)    Subjective:    Patient ID: Tasha Clarke, female    DOB: 1951-03-04, 68 y.o.   MRN: [id number]  HPI: Tasha Clarke is a 68 y.o. female presenting on 07/26/2018 for elevated BNP (2 month follow up); Seizures (Patient states she thinks she has had some seizures but her husband does not think she has.); Fatigue (has not improved from last visit ); and Anorexia (Has not improved from last visit)   HPI Possible syncopal versus seizure-like activity Patient is coming in for hospital follow-up for possible seizure versus syncopal-like activity.  She does have follow-up with neurology.  She has had a couple incidences over the past couple weeks where she has had an episode like this that he thinks is syncope and she thinks is a seizure although there is no convulsive episode witnessed.  He will check her blood pressure after and it will be down in the diastolic of 12R and 97'J and he has been concerned about how low it gets.  She is still having a lot of fatigue which has not improved since last visit.  She says her appetite has been down since last visit as well.  She does have a history extensively of CAD and COPD and she is currently on oxygen all the time for the COPD.  She has had 2 stents but she has not seen a cardiologist here since she moved to this area.  She did have an injection fraction and wall motion on a recent echocardiogram a few months ago that looked normal.  Patient is also been anemic which is also a possible cause of all of these symptoms and we will recheck this today.  She denies any bruising or bleeding abnormalities currently.  She denies any chest pain or palpitations reports she just feels this fatigue and shortness of breath and lack of energy.  She does continue to smoke despite being on oxygen for chronic COPD.  Relevant past medical, surgical, family and social history reviewed and updated as  indicated. Interim medical history since our last visit reviewed. Allergies and medications reviewed and updated.  Review of Systems  Constitutional: Positive for fatigue. Negative for chills and fever.  HENT: Positive for congestion. Negative for ear discharge and ear pain.   Eyes: Negative for redness and visual disturbance.  Respiratory: Positive for cough and shortness of breath. Negative for chest tightness.   Cardiovascular: Negative for chest pain and leg swelling.  Genitourinary: Negative for difficulty urinating and dysuria.  Musculoskeletal: Negative for back pain and gait problem.  Skin: Negative for rash.  Neurological: Positive for seizures, syncope and weakness. Negative for headaches.  Psychiatric/Behavioral: Negative for agitation and behavioral problems.  All other systems reviewed and are negative.   Per HPI unless specifically indicated above   Allergies as of 07/26/2018      Reactions   Cephalexin Hives   Ketorolac Tromethamine Hives   Lac Bovis Nausea And Vomiting   Imdur  [isosorbide Dinitrate]    Iodinated Diagnostic Agents    Other Other (See Comments)   Alka seltzer causes blurred vision, fainting   Sodium Bicarbonate-citric Acid    Milk-related Compounds Nausea And Vomiting      Medication List       Accurate as of July 26, 2018 11:59 PM. Always use your most recent med list.        alendronate 70 MG tablet  Commonly known as:  FOSAMAX Take 1 tablet (70 mg total) by mouth every Monday. Take with a full glass of water on an empty stomach.   aspirin EC 81 MG tablet Take 81 mg by mouth daily.   atorvastatin 40 MG tablet Commonly known as:  LIPITOR Take 40 mg by mouth daily.   butalbital-acetaminophen-caffeine 50-325-40 MG tablet Commonly known as:  FIORICET, ESGIC Take by mouth 2 (two) times daily as needed for headache or migraine.   BUTRANS 10 MCG/HR Ptwk patch Generic drug:  buprenorphine Place 10 mcg onto the skin once a week.     cyclobenzaprine 10 MG tablet Commonly known as:  FLEXERIL TAKE 1 TABLET BY MOUTH TWICE DAILY AS NEEDED FOR MUSCLE SPASM   DULoxetine 30 MG capsule Commonly known as:  CYMBALTA Take 3 capsules (90 mg total) by mouth daily.   EPINEPHrine 0.3 mg/0.3 mL Soaj injection Commonly known as:  EPI-PEN Inject 0.3 mg into the muscle as needed.   Fish Oil 1000 MG Caps Take 1,000 mg by mouth daily.   fluticasone 50 MCG/ACT nasal spray Commonly known as:  FLONASE Place 2 sprays into both nostrils daily.   Fluticasone-Salmeterol 250-50 MCG/DOSE Aepb Commonly known as:  ADVAIR Inhale 1 puff into the lungs 2 (two) times daily.   folic acid 1 MG tablet Commonly known as:  FOLVITE Take 1 mg by mouth daily.   gabapentin 600 MG tablet Commonly known as:  NEURONTIN Take 1 tablet (600 mg total) by mouth 4 (four) times daily.   ibuprofen 200 MG tablet Commonly known as:  ADVIL,MOTRIN Take 200 mg by mouth every 6 (six) hours as needed.   ipratropium-albuterol 0.5-2.5 (3) MG/3ML Soln Commonly known as:  DUONEB Take 3 mLs by nebulization every 6 (six) hours as needed (shortness of breathe).   Iron 325 (65 Fe) MG Tabs Take 1 tablet (325 mg total) by mouth daily.   levETIRAcetam 1000 MG tablet Commonly known as:  KEPPRA Take 1 tablet (1,000 mg total) by mouth 2 (two) times daily.   levothyroxine 50 MCG tablet Commonly known as:  SYNTHROID, LEVOTHROID Take 1 tablet (50 mcg total) by mouth daily before breakfast.   Magnesium 400 MG Caps Take 400 mg by mouth daily.   Melatonin 10 MG Tabs Take by mouth.   metoprolol tartrate 25 MG tablet Commonly known as:  LOPRESSOR Take 12.5 mg by mouth 2 (two) times daily.   MULTIVITAMIN ADULTS PO Take 1 tablet by mouth as directed.   multivitamin tablet Take 1 tablet by mouth daily.   NARCAN 4 MG/0.1ML Liqd nasal spray kit Generic drug:  naloxone Place into the nose.   potassium chloride 10 MEQ tablet Commonly known as:  K-DUR Take 1  tablet by mouth daily.   PROVENTIL HFA 108 (90 Base) MCG/ACT inhaler Generic drug:  albuterol Inhale into the lungs.   TRELEGY ELLIPTA 100-62.5-25 MCG/INH Aepb Generic drug:  Fluticasone-Umeclidin-Vilant Inhale into the lungs 1 day or 1 dose.   TYLENOL PM EXTRA STRENGTH 25-500 MG Tabs tablet Generic drug:  diphenhydramine-acetaminophen Take 1 tablet by mouth at bedtime as needed (pain).   VITAMIN B-12 PO Take by mouth daily.   cyanocobalamin 100 MCG tablet Take 100 mcg by mouth.   Vitamin D-3 125 MCG (5000 UT) Tabs Take 5,000 Units by mouth daily.          Objective:    BP 138/86   Pulse 70   Temp 98.5 F (36.9 C) (Oral)   Wt Readings from  Last 3 Encounters:  06/16/18 220 lb (99.8 kg)  05/18/18 210 lb 1.6 oz (95.3 kg)  03/28/18 192 lb (87.1 kg)    Physical Exam Vitals signs and nursing note reviewed.  Constitutional:      General: She is not in acute distress.    Appearance: She is well-developed. She is ill-appearing. She is not diaphoretic.     Comments: Patient is frail appearing and in a wheelchair and on 2 L nasal cannula  Eyes:     Conjunctiva/sclera: Conjunctivae normal.  Cardiovascular:     Rate and Rhythm: Normal rate and regular rhythm.     Heart sounds: Normal heart sounds. No murmur.  Pulmonary:     Effort: Pulmonary effort is normal. No respiratory distress.     Breath sounds: Normal breath sounds. No wheezing.  Musculoskeletal: Normal range of motion.        General: No tenderness.  Skin:    General: Skin is warm and dry.     Findings: No rash.  Neurological:     Mental Status: She is alert and oriented to person, place, and time.     Coordination: Coordination normal.  Psychiatric:        Behavior: Behavior normal.         Assessment & Plan:   Problem List Items Addressed This Visit    None    Visit Diagnoses    Syncope, unspecified syncope type    -  Primary   Patient had a syncopal-like episode and checked her blood pressure  afterwards her husband says it was down in the 24M and 62'M diastolic   Relevant Orders   Ambulatory referral to Cardiology   CBC with Differential/Platelet (Completed)   CMP14+EGFR (Completed)   TSH (Completed)   Other fatigue       Relevant Orders   Ambulatory referral to Cardiology   CBC with Differential/Platelet (Completed)   CMP14+EGFR (Completed)   TSH (Completed)   Coronary artery disease involving native coronary artery of native heart without angina pectoris       Relevant Orders   Ambulatory referral to Cardiology   CBC with Differential/Platelet (Completed)   CMP14+EGFR (Completed)   TSH (Completed)      Patient does have an appointment with neurology for possible seizures in the coming week.  We will set up with cardiology, patient has had 2 stents previously and is having a lot of fatigue.  We just did an echocardiogram which did show her ejection fraction and wall motion was normal with mild aortic stenosis but they are still concerned that it could be causing a lot of issues so they want to see a cardiologist.  Patient has been anemic so that is likely causing a lot of her fatigue symptoms  Because of her possible syncopal episodes will cut the metoprolol in half and only take 12.5 mg twice daily.  Follow up plan: Return in about 3 months (around 10/25/2018), or if symptoms worsen or fail to improve, for Recheck hypertension fatigue and syncope and CAD.  Counseling provided for all of the vaccine components Orders Placed This Encounter  Procedures  . CBC with Differential/Platelet  . CMP14+EGFR  . TSH  . Ambulatory referral to Cardiology    Caryl Pina, MD [facility] 07/30/2018, 9:52 PM

## 2018-07-27 LAB — CBC WITH DIFFERENTIAL/PLATELET
Basophils Absolute: 0.1 10*3/uL (ref 0.0–0.2)
Basos: 1 %
EOS (ABSOLUTE): 0.3 10*3/uL (ref 0.0–0.4)
EOS: 4 %
HEMATOCRIT: 33.7 % — AB (ref 34.0–46.6)
Hemoglobin: 11 g/dL — ABNORMAL LOW (ref 11.1–15.9)
Immature Grans (Abs): 0.1 10*3/uL (ref 0.0–0.1)
Immature Granulocytes: 1 %
Lymphocytes Absolute: 1.5 10*3/uL (ref 0.7–3.1)
Lymphs: 20 %
MCH: 29.6 pg (ref 26.6–33.0)
MCHC: 32.6 g/dL (ref 31.5–35.7)
MCV: 91 fL (ref 79–97)
Monocytes Absolute: 0.6 10*3/uL (ref 0.1–0.9)
Monocytes: 8 %
Neutrophils Absolute: 4.9 10*3/uL (ref 1.4–7.0)
Neutrophils: 66 %
Platelets: 308 10*3/uL (ref 150–450)
RBC: 3.72 x10E6/uL — ABNORMAL LOW (ref 3.77–5.28)
RDW: 15.9 % — ABNORMAL HIGH (ref 11.7–15.4)
WBC: 7.5 10*3/uL (ref 3.4–10.8)

## 2018-07-27 LAB — CMP14+EGFR
ALBUMIN: 3.7 g/dL (ref 3.6–4.8)
ALT: 19 IU/L (ref 0–32)
AST: 22 IU/L (ref 0–40)
Albumin/Globulin Ratio: 1.3 (ref 1.2–2.2)
Alkaline Phosphatase: 70 IU/L (ref 39–117)
BUN / CREAT RATIO: 24 (ref 12–28)
BUN: 23 mg/dL (ref 8–27)
Bilirubin Total: <0.2 mg/dL (ref 0.0–1.2)
CO2: 21 mmol/L (ref 20–29)
Calcium: 9.7 mg/dL (ref 8.7–10.3)
Chloride: 102 mmol/L (ref 96–106)
Creatinine, Ser: 0.95 mg/dL (ref 0.57–1.00)
GFR calc Af Amer: 72 mL/min/{1.73_m2} (ref >59)
GFR calc non Af Amer: 63 mL/min/{1.73_m2} (ref >59)
GLOBULIN, TOTAL: 2.8 g/dL (ref 1.5–4.5)
Glucose: 89 mg/dL (ref 65–99)
Potassium: 5.3 mmol/L — ABNORMAL HIGH (ref 3.5–5.2)
SODIUM: 134 mmol/L (ref 134–144)
Total Protein: 6.5 g/dL (ref 6.0–8.5)

## 2018-07-27 LAB — TSH: TSH: 3.51 u[IU]/mL (ref 0.450–4.500)

## 2018-07-31 ENCOUNTER — Ambulatory Visit: Payer: Medicare PPO | Admitting: Neurology

## 2018-07-31 ENCOUNTER — Encounter: Payer: Self-pay | Admitting: Neurology

## 2018-07-31 VITALS — BP 150/83 | HR 73 | Ht 65.0 in | Wt 220.0 lb

## 2018-07-31 DIAGNOSIS — G40909 Epilepsy, unspecified, not intractable, without status epilepticus: Secondary | ICD-10-CM

## 2018-07-31 NOTE — Patient Instructions (Addendum)
Please continue Keppra and Neurontin as directed    Seizure, Adult When you have a seizure:  Parts of your body may move.  You may have a change in how aware or awake (conscious) you are.  You may shake (convulse). Seizures usually last from 30 seconds to 2 minutes. Usually, they are not harmful unless they last a long time. What are the signs or symptoms? Common symptoms of this condition include:  Shaking (convulsions).  Stiffness in the body.  Passing out (losing consciousness).  Uncontrolled movements in the: ? Arms or legs. ? Eyes. ? Head. ? Mouth. Some people have symptoms right before a seizure happens. These symptoms may include:  Fear.  Worry (anxiety).  Feeling like you are going to throw up (nausea).  Feeling like the room is spinning (vertigo).  Feeling like you saw or heard something before (dj vu).  Odd tastes or smells.  Changes in vision, such as seeing flashing lights or spots. Follow these instructions at home: Medicines   Take over-the-counter and prescription medicines only as told by your doctor.  Do not eat or drink anything that may keep your medicine from working, such as alcohol. Activity  Do not do any activities that would be dangerous if you had another seizure, like driving or swimming. Wait until your doctor says it is safe for you to do them.  If you live in the [place] ask your local DMV (department of motor vehicles) when you can drive.  Get plenty of rest. Teaching others   Teach friends and family what to do when you have a seizure. They should: ? Lay you on the ground. ? Protect your head and body. ? Loosen any tight clothing around your neck. ? Turn you on your side. ? Not hold you down. ? Not put anything into your mouth. ? Know whether or not you need emergency care. ? Stay with you until you are better. General instructions  Contact your doctor each time you have a seizure.  Avoid anything that gives you  seizures.  Keep a seizure diary. Write down: ? What you think caused each seizure. ? What you remember about each seizure.  Keep all follow-up visits as told by your doctor. This is important. Contact a doctor if:  You have another seizure.  You have seizures more often.  There is any change in what happens during your seizures.  You keep having seizures with treatment.  You have symptoms of being sick or having an infection. Get help right away if:  You have a seizure: ? That lasts longer than 5 minutes. ? That is different than seizures you had before. ? That makes it harder to breathe. ? After you hurt your head.  After a seizure, you cannot speak or use a part of your body.  After a seizure, you are confused or have a bad headache.  You have two or more seizures in a row.  You are having seizures more often.  You do not wake up right after a seizure.  You get hurt during a seizure. In an emergency:  These symptoms may be an emergency. Do not wait to see if the symptoms will go away. Get medical help right away. Call your local emergency services (911 in the [place] Do not drive yourself to the hospital. Summary  Seizures usually last from 30 seconds to 2 minutes. Usually, they are not harmful unless they last a long time.  Do not eat or drink anything  that may keep your medicine from working, such as alcohol.  Teach friends and family what to do when you have a seizure.  Contact your doctor each time you have a seizure. This information is not intended to replace advice given to you by your health care provider. Make sure you discuss any questions you have with your health care provider. Document Released: 12/15/2007 Document Revised: 03/22/2018 Document Reviewed: 08/04/2017 Elsevier Interactive Patient Education  2019 [employer].

## 2018-07-31 NOTE — Progress Notes (Signed)
[facility]    Provider:  Dr Jaynee Eagles Referring Provider: Dettinger, Fransisca Kaufmann, MD Primary Care Physician:  Dettinger, Fransisca Kaufmann, MD  CC:  seizures  HPI:  Tasha Clarke is a 68 y.o. female here as requested by Dr. Warrick Parisian for seizure disorder.  PMHx migraine, depression, anxiety current smoker, CHF, COPD, depression, diabetes, hypertension, hyperlipidemia, seizures, sleep apnea, thyroid disease. She has been seizure free. She is doing well. She is here with her husband who provides much information and asks questions. We discussed seizures and seizure precautions. Discussed smoking cessation and that medical ilnesses can lower the seizure threshold. Discussed medication compliance. Discussed medications that can decrease seizure threshold, OTC and prescription and make sure all your doctors know you have seizures and do not take any OTC meds without discussing wuth physician.   Reviewed notes, labs and imaging from outside physicians, which showed:  Reviewed hospital notes.  Patient was admitted in July 2019.  She presented to an outside hospital after having intractable nausea and vomiting.  Patient started having seizures in the outside hospital.  She was given IV Keppra.  She has a history of seizures and the dose given was the same that she typically gets prior to hospitalization.  Description was stiffening and shaking with alteration of consciousness lasting between 20 and 60 seconds.  Patient had a seizure while inpatient and 2 seizures in the morning of 01/19/2018.  EEG showed a single episode of right temporal slowing but no epileptiform activity.  Patient was intubated. Secondary to epileptic noncompliance.   Reviewed CT of the head reports: Mild generalized atrophy. No evidence of old or acute focal infarction, mass lesion, hemorrhage, hydrocephalus or extra-axial collection.   Review of Systems: Patient complains of symptoms per HPI as well as the  following symptoms: Fatigue, eye pain, anemia, shortness of breath, cough, wheezing, snoring, memory loss, confusion, headache, numbness, weakness, difficulty swallowing, dizziness, passing out, insomnia, sleepiness, snoring, restless leg, depression, anxiety, not enough sleep, decreased energy, change in appetite. Pertinent negati she was Ves and positives per HPI. All others negative.   Social History   Socioeconomic History  . Marital status: Married    Spouse name: Not on file  . Number of children: Not on file  . Years of education: Not on file  . Highest education level: Not on file  Occupational History  . Not on file  Social Needs  . Financial resource strain: Not on file  . Food insecurity:    Worry: Not on file    Inability: Not on file  . Transportation needs:    Medical: Not on file    Non-medical: Not on file  Tobacco Use  . Smoking status: Current Every Day Smoker    Packs/day: 0.75    Years: 43.00    Pack years: 32.25    Types: Cigarettes  . Smokeless tobacco: Never Used  Substance and Sexual Activity  . Alcohol use: No    Frequency: Never  . Drug use: No  . Sexual activity: Not on file  Lifestyle  . Physical activity:    Days per week: Not on file    Minutes per session: Not on file  . Stress: Not on file  Relationships  . Social connections:    Talks on phone: Not on file    Gets together: Not on file    Attends religious service: Not on file    Active member of club or organization: Not on file    Attends  meetings of clubs or organizations: Not on file    Relationship status: Not on file  . Intimate partner violence:    Fear of current or ex partner: Not on file    Emotionally abused: Not on file    Physically abused: Not on file    Forced sexual activity: Not on file  Other Topics Concern  . Not on file  Social History Narrative  . Not on file    Family History  Adopted: Yes  Problem Relation Age of Onset  . Arthritis Mother   . Asthma  Mother   . Cancer Mother        lung  . Depression Mother   . Diabetes Mother   . Arthritis Father   . Asthma Father   . Depression Father   . Asthma Sister   . Depression Sister   . Diabetes Sister   . Arthritis Maternal Grandmother   . Asthma Maternal Grandmother   . Depression Maternal Grandmother   . Arthritis Maternal Grandfather   . Asthma Maternal Grandfather   . Arthritis Paternal Grandmother   . Asthma Paternal Grandmother   . Arthritis Paternal Grandfather   . Asthma Paternal Grandfather   . COPD Paternal Grandfather   . Diabetes Sister   . Mental illness Sister   . Anemia Sister   . Hypertension Son   . Post-traumatic stress disorder Son   . Allergies Daughter   . Breast cancer Neg Hx     Past Medical History:  Diagnosis Date  . Allergy   . Anemia   . Anxiety   . Arthritis   . Asthma   . Blood transfusion without reported diagnosis   . Cataract   . CHF (congestive heart failure) ([place])   . Chronic kidney disease   . Clotting disorder ([place])   . COPD (chronic obstructive pulmonary disease) ([place])   . Depression   . Diabetes mellitus without complication ([place])   . Emphysema of lung ([place])   . GERD (gastroesophageal reflux disease)   . Hyperlipidemia   . Hypertension   . Neuromuscular disorder ([place])   . Osteoporosis   . Oxygen deficiency   . Seizures ([place])   . Skin cancer    2 areas removed more than 10 years ago  . Sleep apnea 1985   wears cpap nightly  . Thyroid disease     Past Surgical History:  Procedure Laterality Date  . APPENDECTOMY    . BIOPSY  03/16/2018   Procedure: BIOPSY;  Surgeon: Daneil Dolin, MD;  Location: [facility];  Service: Endoscopy;;  gastric bx's  . ESOPHAGOGASTRODUODENOSCOPY (EGD) WITH PROPOFOL N/A 03/16/2018   Procedure: ESOPHAGOGASTRODUODENOSCOPY (EGD) WITH PROPOFOL;  Surgeon: Daneil Dolin, MD;  Location: [facility];  Service: Endoscopy;  Laterality: N/A;  3:00pm  . EYE SURGERY    . FRACTURE SURGERY    . heart  stents    . JOINT REPLACEMENT    . SPINAL CORD STIMULATOR IMPLANT  2015  . SPINE SURGERY      Current Outpatient Medications  Medication Sig Dispense Refill  . albuterol (PROVENTIL HFA) 108 (90 Base) MCG/ACT inhaler Inhale into the lungs.    Marland Kitchen alendronate (FOSAMAX) 70 MG tablet Take 1 tablet (70 mg total) by mouth every Monday. Take with a full glass of water on an empty stomach. 13 tablet 3  . aspirin EC 81 MG tablet Take 81 mg by mouth daily.    Marland Kitchen atorvastatin (LIPITOR) 40 MG  tablet Take 40 mg by mouth daily.    . buprenorphine (BUTRANS) 10 MCG/HR PTWK patch Place 10 mcg onto the skin once a week.    . butalbital-acetaminophen-caffeine (FIORICET, ESGIC) 50-325-40 MG tablet Take by mouth 2 (two) times daily as needed for headache or migraine.    . Cholecalciferol (VITAMIN D-3) 5000 units TABS Take 5,000 Units by mouth daily.    . Cyanocobalamin (VITAMIN B-12 PO) Take by mouth daily.    . cyanocobalamin 100 MCG tablet Take 100 mcg by mouth.    . cyclobenzaprine (FLEXERIL) 10 MG tablet TAKE 1 TABLET BY MOUTH TWICE DAILY AS NEEDED FOR MUSCLE SPASM 30 tablet 0  . diphenhydramine-acetaminophen (TYLENOL PM EXTRA STRENGTH) 25-500 MG TABS tablet Take 1 tablet by mouth at bedtime as needed (pain).     . DULoxetine (CYMBALTA) 30 MG capsule Take 3 capsules (90 mg total) by mouth daily. 90 capsule 3  . EPINEPHrine 0.3 mg/0.3 mL IJ SOAJ injection Inject 0.3 mg into the muscle as needed.    . Ferrous Sulfate (IRON) 325 (65 Fe) MG TABS Take 1 tablet (325 mg total) by mouth daily. 90 each 0  . fluticasone (FLONASE) 50 MCG/ACT nasal spray Place 2 sprays into both nostrils daily. 16 g 2  . Fluticasone-Salmeterol (ADVAIR) 250-50 MCG/DOSE AEPB Inhale 1 puff into the lungs 2 (two) times daily. 60 each 6  . Fluticasone-Umeclidin-Vilant (TRELEGY ELLIPTA) 100-62.5-25 MCG/INH AEPB Inhale into the lungs 1 day or 1 dose.    . folic acid (FOLVITE) 1 MG tablet Take 1 mg by mouth daily.    Marland Kitchen gabapentin (NEURONTIN) 600  MG tablet Take 1 tablet (600 mg total) by mouth 4 (four) times daily. 360 tablet 3  . ibuprofen (ADVIL,MOTRIN) 200 MG tablet Take 200 mg by mouth every 6 (six) hours as needed.    Marland Kitchen ipratropium-albuterol (DUONEB) 0.5-2.5 (3) MG/3ML SOLN Take 3 mLs by nebulization every 6 (six) hours as needed (shortness of breathe).     . levETIRAcetam (KEPPRA) 1000 MG tablet Take 1 tablet (1,000 mg total) by mouth 2 (two) times daily. (Patient taking differently: Take 1,000 mg by mouth 2 (two) times daily. 750 morning 1000 at night) 30 tablet 2  . levothyroxine (SYNTHROID, LEVOTHROID) 50 MCG tablet Take 1 tablet (50 mcg total) by mouth daily before breakfast. 60 tablet 0  . Magnesium 400 MG CAPS Take 400 mg by mouth daily.    . Melatonin 10 MG TABS Take by mouth.    . metoprolol tartrate (LOPRESSOR) 25 MG tablet Take 12.5 mg by mouth 2 (two) times daily.    . Multiple Vitamin (MULTIVITAMIN) tablet Take 1 tablet by mouth daily.    . Multiple Vitamins-Minerals (MULTIVITAMIN ADULTS PO) Take 1 tablet by mouth as directed.    . naloxone (NARCAN) nasal spray 4 mg/0.1 mL Place into the nose.    . Omega-3 Fatty Acids (FISH OIL) 1000 MG CAPS Take 1,000 mg by mouth daily.    . potassium chloride (K-DUR) 10 MEQ tablet Take 1 tablet by mouth daily.     No current facility-administered medications for this visit.     Allergies as of 07/31/2018 - Review Complete 07/26/2018  Allergen Reaction Noted  . Cephalexin Hives 01/24/2013  . Ketorolac tromethamine Hives 07/26/2017  . [place] Nausea And Vomiting 05/28/2015  . Imdur  [isosorbide dinitrate]    . Iodinated diagnostic agents  05/28/2015  . Other Other (See Comments) 01/24/2013  . Sodium bicarbonate-citric acid  08/24/2017  . Milk-related compounds  Nausea And Vomiting 05/28/2015    Vitals: There were no vitals taken for this visit. Last Weight:  Wt Readings from Last 1 Encounters:  06/16/18 220 lb (99.8 kg)   Last Height:   Ht Readings from Last 1  Encounters:  06/16/18 5\' 5"  (1.651 m)   Physical exam: Exam: Gen: NAD, obese                 CV: RRR, no MRG. No Carotid Bruits. No peripheral edema, warm, nontender Eyes: Conjunctivae clear without exudates or hemorrhage  Neuro: Detailed Neurologic Exam  Speech:    Speech is without aphasia and dysarthria  Cognition:    The patient is oriented to person, place, and time;     recent and remote memory impaired;     language fluent;     impairedattention, concentration,     fund of knowledge Cranial Nerves:    The pupils are equal, round, and reactive to light.  Visual fields are full to finger confrontation. Extraocular movements are intact. Trigeminal sensation is intact and the muscles of mastication are normal. The face is symmetric. The palate elevates in the midline. Hearing intact to voice. Voice is normal. Shoulder shrug is normal. The tongue has normal motion without fasciculations.   Coordination:    No dysmetria noted   Motor Observation:    no involuntary movements noted.    Posture:    Posture is normal sitting in wheelchair    Strength:    Strength is antigravity and equal     Sensation: intact to LT     Reflex Exam:  DTR's:    Deep tendon reflexes in the upper and lower extremities are symmetrical bilaterally.      Assessment/Plan:  68 y.o. female here as requested by Dr. Warrick Parisian for seizure disorder.  PMHx COD current smoker, CHF, COPD, depression, diabetes, hypertension, hyperlipidemia, seizures, sleep apnea, thyroid disease.  She is on Keppra and Neurontin. she was admitted in July and intubated secondary to seizures, enteroccus and medication noncompliance.  Although she had multiple seizures at any pain when EEG was hooked up, EEG showed she was not having seizures.  Discussed Patients with epilepsy have a small risk of sudden unexpected death, a condition referred to as sudden unexpected death in epilepsy (SUDEP). SUDEP is defined specifically as  the sudden, unexpected, witnessed or unwitnessed, nontraumatic and nondrowning death in patients with epilepsy with or without evidence for a seizure, and excluding documented status epilepticus, in which post mortem examination does not reveal a structural or toxicologic cause for death   Patient is unable to drive, operate heavy machinery, perform activities at heights or participate in water activities until 6 months seizure free  Sarina Ill, MD  [facility] [address] [place], [place] [address]  Phone [phone] Fax [phone]  A total of 45 minutes was spent face-to-face with this patient. Over half this time was spent on counseling patient on the  1. Seizure disorder ([place])    diagnosis and different diagnostic and therapeutic options, counseling and coordination of care, risks ans benefits of management, compliance, or risk factor reduction and education.

## 2018-08-01 ENCOUNTER — Other Ambulatory Visit: Payer: Self-pay | Admitting: *Deleted

## 2018-08-02 NOTE — Patient Outreach (Signed)
[facility]) Care Management  08/02/2018  Evanthia Maund Vokes 10/29/1950 [id number]   Case closure home visit.  Mrs. Holycross has fully accepted the palliative care services from [facility]. She is doing well today as far as her respiratory status and having no siezure activity.  She does complain of chronic, unrelenting HA and back pain. She reports her migraine medication was recently discontinued by Dr. Leontine Locket, her pain management MD because her Butrans patch dose was increased. She says that she has reported to his office that she has had a continuous HA since then and no improvement in her back pain. She does not have a follow up with him until the end of February.  BP (!) 160/0 Comment: Unable to auscultate the diastolic reading.  Pulse 70   Resp 18   SpO2 98%    RRR Lungs have inspiratory and expiratory wheezes that improve slightly with a cough however once she starts coughing, she coughs for a while. She is wearing her O2. Heart RRR  A: Stable COPD, Seizure disorder     Uncontrolled migraine and back pain at this time.      Pt has transitioned to Palliative care servcies.  P: Closing [facility] Care Management case today. Pt very appreciative of our services over the last 6 months.  Called and left message on nurse triage line at Dr. Rennis Harding office reporting pt pain and need for consideration for further adjustments in her medication regimen.  [name]. [name], MSN, Ms [facility] Gerontological Nurse Practitioner [facility] Care Management [phone]

## 2018-08-07 ENCOUNTER — Ambulatory Visit (INDEPENDENT_AMBULATORY_CARE_PROVIDER_SITE_OTHER)
Admission: RE | Admit: 2018-08-07 | Discharge: 2018-08-07 | Disposition: A | Discharge status: Home / Self Care | Payer: Medicare PPO | Source: Ambulatory Visit | Attending: Acute Care | Admitting: Acute Care

## 2018-08-07 ENCOUNTER — Ambulatory Visit (INDEPENDENT_AMBULATORY_CARE_PROVIDER_SITE_OTHER): Payer: Medicare PPO | Admitting: Acute Care

## 2018-08-07 ENCOUNTER — Encounter: Payer: Self-pay | Admitting: Acute Care

## 2018-08-07 VITALS — BP 96/60 | HR 76

## 2018-08-07 DIAGNOSIS — F17210 Nicotine dependence, cigarettes, uncomplicated: Secondary | ICD-10-CM

## 2018-08-07 DIAGNOSIS — Z122 Encounter for screening for malignant neoplasm of respiratory organs: Secondary | ICD-10-CM

## 2018-08-07 DIAGNOSIS — Z87891 Personal history of nicotine dependence: Secondary | ICD-10-CM

## 2018-08-07 NOTE — Progress Notes (Signed)
Shared Decision Making Visit Lung Cancer Screening Program [phone])   Eligibility:  Age 68 y.o.  Pack Years Smoking History Calculation 45 pack year smoking history (# packs/per year x # years smoked)  Recent History of coughing up blood  no  Unexplained weight loss? no ( >Than 15 pounds within the last 6 months )  Prior History Lung / other cancer no (Diagnosis within the last 5 years already requiring surveillance chest CT Scans).  Smoking Status Current Smoker  Former Smokers: Years since quit: NA  Quit Date: 08/07/2018  Visit Components:  Discussion included one or more decision making aids. yes  Discussion included risk/benefits of screening. yes  Discussion included potential follow up diagnostic testing for abnormal scans. yes  Discussion included meaning and risk of over diagnosis. yes  Discussion included meaning and risk of False Positives. yes  Discussion included meaning of total radiation exposure. yes  Counseling Included:  Importance of adherence to annual lung cancer LDCT screening. yes  Impact of comorbidities on ability to participate in the program. yes  Ability and willingness to under diagnostic treatment. yes  Smoking Cessation Counseling:  Current Smokers:   Discussed importance of smoking cessation. yes  Information about tobacco cessation classes and interventions provided to patient. yes  Patient provided with "ticket" for LDCT Scan. yes  Symptomatic Patient. no  Counseling  Diagnosis Code: Tobacco Use Z72.0  Asymptomatic Patient yes  Counseling (Intermediate counseling: > three minutes counseling) [id number]  Former Smokers:   Discussed the importance of maintaining cigarette abstinence. yes  Diagnosis Code: Personal History of Nicotine Dependence. Y17.494  Information about tobacco cessation classes and interventions provided to patient. Yes  Patient provided with "ticket" for LDCT Scan. yes  Written Order for Lung Cancer  Screening with LDCT placed in Epic. Yes (CT Chest Lung Cancer Screening Low Dose W/O CM) [id number] Z12.2-Screening of respiratory organs Z87.891-Personal history of nicotine dependence  I have spent 25 minutes of face to face time with Ms. Steele discussing the risks and benefits of lung cancer screening. We viewed a power point together that explained in detail the above noted topics. We paused at intervals to allow for questions to be asked and answered to ensure understanding.We discussed that the single most powerful action that she can take to decrease her risk of developing lung cancer is to quit smoking. We discussed whether or not she is ready to commit to setting a quit date. We discussed options for tools to aid in quitting smoking including nicotine replacement therapy, non-nicotine medications, support groups, Quit Smart classes, and behavior modification. We discussed that often times setting smaller, more achievable goals, such as eliminating 1 cigarette a day for a week and then 2 cigarettes a day for a week can be helpful in slowly decreasing the number of cigarettes smoked. This allows for a sense of accomplishment as well as providing a clinical benefit. I gave her the " Be Stronger Than Your Excuses" card with contact information for community resources, classes, free nicotine replacement therapy, and access to mobile apps, text messaging, and on-line smoking cessation help. I have also given her my card and contact information in the event she needs to contact me. We discussed the time and location of the scan, and that either Doroteo Glassman RN or I will call with the results within 24-48 hours of receiving them. I have offered her  a copy of the power point we viewed  as a resource in the event they need reinforcement of  the concepts we discussed today in the office. The patient verbalized understanding of all of  the above and had no further questions upon leaving the office. They have  my contact information in the event they have any further questions.  I spent 5 minutes counseling on smoking cessation and the health risks of continued tobacco abuse.  I explained to the patient that there has been a high incidence of coronary artery disease noted on these exams. I explained that this is a non-gated exam therefore degree or severity cannot be determined. This patient is currently on statin therapy. I have asked the patient to follow-up with their PCP regarding any incidental  finding of coronary artery disease and management with diet or medication as their PCP  feels is clinically indicated. The patient verbalized understanding of the above and had no further questions upon completion of the visit.      Magdalen Spatz, NP 08/07/2018 4:46 PM

## 2018-08-08 DIAGNOSIS — F17200 Nicotine dependence, unspecified, uncomplicated: Secondary | ICD-10-CM | POA: Diagnosis not present

## 2018-08-08 DIAGNOSIS — I10 Essential (primary) hypertension: Secondary | ICD-10-CM | POA: Diagnosis not present

## 2018-08-08 DIAGNOSIS — J449 Chronic obstructive pulmonary disease, unspecified: Secondary | ICD-10-CM | POA: Diagnosis not present

## 2018-08-08 DIAGNOSIS — J9611 Chronic respiratory failure with hypoxia: Secondary | ICD-10-CM | POA: Diagnosis not present

## 2018-08-13 ENCOUNTER — Other Ambulatory Visit: Payer: Self-pay | Admitting: Family Medicine

## 2018-08-14 NOTE — Telephone Encounter (Signed)
Last seen 07/26/18

## 2018-08-15 ENCOUNTER — Other Ambulatory Visit: Payer: Self-pay | Admitting: Acute Care

## 2018-08-15 DIAGNOSIS — Z87891 Personal history of nicotine dependence: Secondary | ICD-10-CM

## 2018-08-15 DIAGNOSIS — F17210 Nicotine dependence, cigarettes, uncomplicated: Secondary | ICD-10-CM

## 2018-08-15 DIAGNOSIS — Z122 Encounter for screening for malignant neoplasm of respiratory organs: Secondary | ICD-10-CM

## 2018-08-17 ENCOUNTER — Other Ambulatory Visit: Payer: Self-pay | Admitting: Family Medicine

## 2018-08-18 ENCOUNTER — Encounter: Payer: Self-pay | Admitting: Internal Medicine

## 2018-08-18 NOTE — Telephone Encounter (Signed)
Last seen 05/17/2019

## 2018-08-25 DIAGNOSIS — J449 Chronic obstructive pulmonary disease, unspecified: Secondary | ICD-10-CM | POA: Diagnosis not present

## 2018-08-26 NOTE — Progress Notes (Signed)
Cardiology Office Note   Date:  08/28/2018   ID:  Tasha Clarke, DOB July 10, 1951, MRN [id number]  PCP:  Dettinger, Fransisca Kaufmann, MD  Cardiologist:   No primary care provider on file. Referring:  Dettinger, Fransisca Kaufmann, MD  Chief Complaint  Patient presents with  . Chest Pain      History of Present Illness: Tasha Clarke is a 68 y.o. female who is referred by Dettinger, Fransisca Kaufmann, MD for evaluation of syncope and CAD.  The patient has a cardiac history and she was living in another town.  I do not have any of these records.  She thinks she had stenting in 2008 with 2 stents but cannot give me any details.  She said she did not have a heart attack.  She has had and echo in Jan of this year which demonstrated a normal EF.  She had a BNP last year that was 123.  She is very limited in her activities.  She has back and neck problems.  She says she has degenerated bones in her ankle.  She gets around often in a wheelchair although she will walk at home.  She has had some chest discomfort.  She had some last night.  She described this as upper epigastric discomfort.  It felt like a ball squeezing.  It was 7 out of 10.  She was nauseated.  She has had some difficulty raising her arms.  She said the discomfort that she was having was unlike her previous angina.  She does not describe associated nausea or vomiting.  Happened at rest.  Went away spontaneously.  She did not take nitroglycerin.  She has chronic oxygen dependence 24/7 secondary to emphysema and COPD.  She continues to smoke cigarettes.  She is not had any work-up since her catheterization by her report.   Of note there was a mention of syncope in her referral information.  However, the patient and her husband cannot remember the details of this and point to the time she was in the hospital in November.  I reviewed these records and it was thought that she might have a seizure.  It does not sound like she is having frank  syncope.  Past Medical History:  Diagnosis Date  . Allergy   . Anemia   . Anxiety   . Arthritis   . Asthma   . Blood transfusion without reported diagnosis   . Cataract   . CHF (congestive heart failure) ([place])   . Chronic kidney disease   . Clotting disorder ([place])   . COPD (chronic obstructive pulmonary disease) ([place])   . Depression   . Diabetes mellitus without complication ([place])    pt states she is not diabetic   . Emphysema of lung ([place])   . GERD (gastroesophageal reflux disease)   . Hyperlipidemia   . Hypertension   . Neuromuscular disorder ([place])   . Osteoporosis   . Seizures ([place])   . Skin cancer    2 areas removed more than 10 years ago  . Sleep apnea 1985   wears cpap nightly  . Thyroid disease     Past Surgical History:  Procedure Laterality Date  . APPENDECTOMY    . BIOPSY  03/16/2018   Procedure: BIOPSY;  Surgeon: Daneil Dolin, MD;  Location: [facility];  Service: Endoscopy;;  gastric bx's  . CAROTID ENDARTERECTOMY Bilateral   . CERVICAL SPINE SURGERY     after neck fracture  .  ESOPHAGOGASTRODUODENOSCOPY (EGD) WITH PROPOFOL N/A 03/16/2018   Procedure: ESOPHAGOGASTRODUODENOSCOPY (EGD) WITH PROPOFOL;  Surgeon: Daneil Dolin, MD;  Location: [facility];  Service: Endoscopy;  Laterality: N/A;  3:00pm  . EYE SURGERY    . FRACTURE SURGERY    . heart stents    . JOINT REPLACEMENT     Right knee  . SPINAL CORD STIMULATOR IMPLANT  2015  . SPINE SURGERY     lumbar x4      Current Outpatient Medications  Medication Sig Dispense Refill  . albuterol (PROVENTIL HFA) 108 (90 Base) MCG/ACT inhaler Inhale into the lungs.    Marland Kitchen alendronate (FOSAMAX) 70 MG tablet Take 1 tablet (70 mg total) by mouth every Monday. Take with a full glass of water on an empty stomach. 13 tablet 3  . aspirin EC 81 MG tablet Take 81 mg by mouth daily.    . buprenorphine (BUTRANS) 10 MCG/HR PTWK patch Place 10 mcg onto the skin once a week.    . butalbital-acetaminophen-caffeine  (FIORICET, ESGIC) 50-325-40 MG tablet Take by mouth 2 (two) times daily as needed for headache or migraine.    . Cholecalciferol (VITAMIN D-3) 5000 units TABS Take 5,000 Units by mouth daily.    . Cyanocobalamin (VITAMIN B-12 PO) Take by mouth daily.    . cyanocobalamin 100 MCG tablet Take 100 mcg by mouth.    . cyclobenzaprine (FLEXERIL) 10 MG tablet TAKE 1 TABLET BY MOUTH TWICE DAILY AS NEEDED FOR MUSCLE SPASM 30 tablet 2  . diphenhydramine-acetaminophen (TYLENOL PM EXTRA STRENGTH) 25-500 MG TABS tablet Take 1 tablet by mouth at bedtime as needed (pain).     . DULoxetine (CYMBALTA) 30 MG capsule Take 3 capsules (90 mg total) by mouth daily. 90 capsule 3  . EPINEPHrine 0.3 mg/0.3 mL IJ SOAJ injection Inject 0.3 mg into the muscle as needed.    . Ferrous Sulfate (IRON) 325 (65 Fe) MG TABS Take 1 tablet (325 mg total) by mouth daily. 90 each 0  . fluticasone (FLONASE) 50 MCG/ACT nasal spray Place 2 sprays into both nostrils daily. 16 g 2  . Fluticasone-Salmeterol (ADVAIR) 250-50 MCG/DOSE AEPB Inhale 1 puff into the lungs 2 (two) times daily. 60 each 6  . Fluticasone-Umeclidin-Vilant (TRELEGY ELLIPTA) 100-62.5-25 MCG/INH AEPB Inhale into the lungs 1 day or 1 dose.    . folic acid (FOLVITE) 1 MG tablet Take 1 mg by mouth daily.    Marland Kitchen gabapentin (NEURONTIN) 600 MG tablet Take 1 tablet (600 mg total) by mouth 4 (four) times daily. 360 tablet 3  . ibuprofen (ADVIL,MOTRIN) 200 MG tablet Take 200 mg by mouth every 6 (six) hours as needed.    Marland Kitchen ipratropium-albuterol (DUONEB) 0.5-2.5 (3) MG/3ML SOLN Take 3 mLs by nebulization every 6 (six) hours as needed (shortness of breathe).     . levETIRAcetam (KEPPRA) 1000 MG tablet TAKE 1 TABLET BY MOUTH AT BEDTIME 30 tablet 2  . levETIRAcetam (KEPPRA) 750 MG tablet Take 750 mg by mouth every morning.    Marland Kitchen levothyroxine (SYNTHROID, LEVOTHROID) 50 MCG tablet Take 1 tablet (50 mcg total) by mouth daily before breakfast. 60 tablet 0  . Magnesium 400 MG CAPS Take 400 mg  by mouth daily.    . Melatonin 10 MG TABS Take by mouth.    . metoprolol tartrate (LOPRESSOR) 25 MG tablet Take 12.5 mg by mouth 2 (two) times daily.    . Multiple Vitamin (MULTIVITAMIN) tablet Take 1 tablet by mouth daily.    . Multiple  Vitamins-Minerals (MULTIVITAMIN ADULTS PO) Take 1 tablet by mouth as directed.    . naloxone (NARCAN) nasal spray 4 mg/0.1 mL Place into the nose.    . Omega-3 Fatty Acids (FISH OIL) 1000 MG CAPS Take 1,000 mg by mouth daily.    . potassium chloride (K-DUR) 10 MEQ tablet Take 1 tablet by mouth daily.    . rosuvastatin (CRESTOR) 40 MG tablet Take 1 tablet (40 mg total) by mouth daily. 90 tablet 3   No current facility-administered medications for this visit.     Allergies:   Cephalexin; Ketorolac tromethamine; Lac bovis; Imdur  [isosorbide dinitrate]; Iodinated diagnostic agents; Other; Sodium bicarbonate-citric acid; and Milk-related compounds    Social History:  The patient  reports that she has been smoking cigarettes. She has a 43.00 pack-year smoking history. She has never used smokeless tobacco. She reports current alcohol use. She reports that she does not use drugs.   Family History:  The patient's family history includes Allergies in her daughter; Anemia in her sister; Arthritis in her father, maternal grandfather, maternal grandmother, mother, paternal grandfather, and paternal grandmother; Asthma in her father, maternal grandfather, maternal grandmother, mother, paternal grandfather, paternal grandmother, and sister; COPD in her paternal grandfather; Cancer in her mother; Depression in her father, maternal grandmother, mother, and sister; Diabetes in her mother, sister, and sister; Hypertension in her son; Mental illness in her sister; Post-traumatic stress disorder in her son. She was adopted.    ROS:  Please see the history of present illness.   Otherwise, review of systems are positive for numbness in the right arm, bilateral calf pain with  ambulation.   All other systems are reviewed and negative.    PHYSICAL EXAM: VS:  BP 120/64   Pulse 79   Ht 5\' 5"  (1.651 m)   Wt 225 lb (102.1 kg)   BMI 37.44 kg/m  , BMI Body mass index is 37.44 kg/m. GEN: Appears much older than her stated age and frail. NECK:  No jugular venous distention at 90 degrees, waveform within normal limits, carotid upstroke brisk and symmetric, no bruits, no thyromegaly LYMPHATICS:  No cervical adenopathy LUNGS:  Clear to auscultation bilaterally BACK:  No CVA tenderness CHEST:  Unremarkable HEART:  S1 and S2 within normal limits, no S3, no S4, no clicks, no rubs, no murmurs ABD:  Positive bowel sounds normal in frequency in pitch, no bruits, no rebound, no guarding, unable to assess midline mass or bruit with the patient seated. EXT:  2 plus pulses upper, absent dorsalis pedis and posterior tibialis, mild/moderate edema, no cyanosis no clubbing SKIN:  No rashes no nodules NEURO:  Cranial nerves II through XII grossly intact, motor grossly intact throughout PSYCH:  Cognitively intact, oriented to person place and time    EKG:  EKG is ordered today. The ekg ordered today demonstrates sinus rhythm, rate 76, lateral T wave inversions, poor anterior R wave progression, no acute ST-T wave changes.   Recent Labs: 05/17/2018: B Natriuretic Peptide 123.0; Magnesium 1.6 07/26/2018: ALT 19; BUN 23; Creatinine, Ser 0.95; Hemoglobin 11.0; Platelets 308; Potassium 5.3; Sodium 134; TSH 3.510    Lipid Panel    Component Value Date/Time   CHOL 231 (H) 01/26/2018 0931   TRIG 276 (H) 01/26/2018 0931   HDL 33 (L) 01/26/2018 0931   CHOLHDL 7.0 (H) 01/26/2018 0931   LDLCALC 143 (H) 01/26/2018 0931      Wt Readings from Last 3 Encounters:  08/28/18 225 lb (102.1 kg)  07/31/18 220  lb (99.8 kg)  06/16/18 220 lb (99.8 kg)      Other studies Reviewed: Additional studies/ records that were reviewed today include: Labs. Review of the above records demonstrates:   Please see elsewhere in the note.     ASSESSMENT AND PLAN:   CAD: The patient has some atypical symptoms.  However, she has a past history of coronary disease and ongoing risk factors.  I like to screen her with a stress test but she would be able to walk on a treadmill.  Therefore, she will have a [employer].  CAROTID STENOSIS: She will need carotid Doppler as this has not been evaluated in a long time.  ARM WEAKNESS: This may be related to cervical disc disease.  Further evaluation can be per her primary care physician.  TOBACCO ABUSE: She did not quit smoking with patches.  She did not tolerate Chantix.  She is unable to quit smoking and we have discussed this.  DYSLIPIDEMIA: I am going to switch her to Crestor 40 mg daily.  She will need a lipid profile liver enzymes in 10 weeks.  LEG PAIN:  I will check ABIs     Current medicines are reviewed at length with the patient today.  The patient does not have concerns regarding medicines.  The following changes have been made:  As above  Labs/ tests ordered today include:   Orders Placed This Encounter  Procedures  . Lipid panel  . Hepatic function panel  . MYOCARDIAL PERFUSION IMAGING  . EKG 12-Lead     Disposition:   FU with me after the above testing.     Signed, Minus Breeding, MD  08/28/2018 11:13 AM    [place]

## 2018-08-28 ENCOUNTER — Encounter: Payer: Self-pay | Admitting: Cardiology

## 2018-08-28 ENCOUNTER — Ambulatory Visit: Payer: Medicare PPO | Admitting: Cardiology

## 2018-08-28 VITALS — BP 120/64 | HR 79 | Ht 65.0 in | Wt 225.0 lb

## 2018-08-28 DIAGNOSIS — Z720 Tobacco use: Secondary | ICD-10-CM | POA: Diagnosis not present

## 2018-08-28 DIAGNOSIS — E785 Hyperlipidemia, unspecified: Secondary | ICD-10-CM

## 2018-08-28 DIAGNOSIS — Z79899 Other long term (current) drug therapy: Secondary | ICD-10-CM | POA: Insufficient documentation

## 2018-08-28 DIAGNOSIS — R079 Chest pain, unspecified: Secondary | ICD-10-CM | POA: Insufficient documentation

## 2018-08-28 DIAGNOSIS — I6523 Occlusion and stenosis of bilateral carotid arteries: Secondary | ICD-10-CM | POA: Diagnosis not present

## 2018-08-28 DIAGNOSIS — R0989 Other specified symptoms and signs involving the circulatory and respiratory systems: Secondary | ICD-10-CM | POA: Insufficient documentation

## 2018-08-28 DIAGNOSIS — I25119 Atherosclerotic heart disease of native coronary artery with unspecified angina pectoris: Secondary | ICD-10-CM | POA: Diagnosis not present

## 2018-08-28 MED ORDER — ROSUVASTATIN CALCIUM 40 MG PO TABS: 40.0000 mg | ORAL_TABLET | Freq: Every day | ORAL | 3 refills | Status: DC

## 2018-08-28 NOTE — Patient Instructions (Signed)
Medication Instructions:  STOP- Atorvastatin START- Crestor 40 mg daily  If you need a refill on your cardiac medications before your next appointment, please call your pharmacy.  Labwork: Fasting Lipid and Liver in 10 weeks HERE IN OUR OFFICE AT [facility]  You will need to fast. DO NOT EAT OR DRINK PAST MIDNIGHT.      Take the provided lab slips with you to the lab for your blood draw.   When you have your labs (blood work) drawn today and your tests are completely normal, you will receive your results only by MyChart Message (if you have MyChart) -OR-  A paper copy in the mail.  If you have any lab test that is abnormal or we need to change your treatment, we will call you to review these results.  Testing/Procedures: Your physician has requested that you have a carotid duplex. This test is an ultrasound of the carotid arteries in your neck. It looks at blood flow through these arteries that supply the brain with blood. Allow one hour for this exam. There are no restrictions or special instructions.  Your physician has requested that you have a lower extremity arterial duplex. This test is an ultrasound of the arteries in the legs or arms. It looks at arterial blood flow in the legs and arms. Allow one hour for Lower and Upper Arterial scans. There are no restrictions or special instructions  Your physician has requested that you have an ankle brachial index (ABI). During this test an ultrasound and blood pressure cuff are used to evaluate the arteries that supply the arms and legs with blood. Allow thirty minutes for this exam. There are no restrictions or special instructions.  Your physician has requested that you have a lexiscan myoview. For further information please visit [email or web address]. Please follow instruction sheet, as given.  Follow-Up: . Your physician recommends that you schedule a follow-up appointment in: After Test   At [facility], you and your health needs are our  priority.  As part of our continuing mission to provide you with exceptional heart care, we have created designated Provider Care Teams.  These Care Teams include your primary Cardiologist (physician) and Advanced Practice Providers (APPs -  Physician Assistants and Nurse Practitioners) who all work together to provide you with the care you need, when you need it.   Thank you for choosing [facility] at [facility]!!

## 2018-09-12 DIAGNOSIS — M961 Postlaminectomy syndrome, not elsewhere classified: Secondary | ICD-10-CM | POA: Diagnosis not present

## 2018-09-12 DIAGNOSIS — M47812 Spondylosis without myelopathy or radiculopathy, cervical region: Secondary | ICD-10-CM | POA: Diagnosis not present

## 2018-09-12 DIAGNOSIS — Z9689 Presence of other specified functional implants: Secondary | ICD-10-CM | POA: Diagnosis not present

## 2018-09-19 ENCOUNTER — Telehealth (HOSPITAL_COMMUNITY): Payer: Self-pay

## 2018-09-19 NOTE — Telephone Encounter (Signed)
Encounter complete. 

## 2018-09-21 ENCOUNTER — Ambulatory Visit (HOSPITAL_COMMUNITY)
Admission: RE | Admit: 2018-09-21 | Discharge: 2018-09-21 | Disposition: A | Discharge status: Home / Self Care | Payer: Medicare PPO | Source: Ambulatory Visit | Attending: Cardiovascular Disease | Admitting: Cardiovascular Disease

## 2018-09-21 ENCOUNTER — Other Ambulatory Visit: Payer: Self-pay

## 2018-09-21 DIAGNOSIS — I25119 Atherosclerotic heart disease of native coronary artery with unspecified angina pectoris: Secondary | ICD-10-CM | POA: Diagnosis not present

## 2018-09-21 LAB — MYOCARDIAL PERFUSION IMAGING
LV dias vol: 84 mL (ref 46–106)
LV sys vol: 26 mL
Peak HR: 84 {beats}/min
Rest HR: 68 {beats}/min
SDS: 0
SRS: 0
SSS: 0
TID: 0.99

## 2018-09-21 MED ORDER — REGADENOSON 0.4 MG/5ML IV SOLN
0.4000 mg | Freq: Once | INTRAVENOUS | Status: AC
  Administered 2018-09-21: 0.4 mg via INTRAVENOUS

## 2018-09-21 MED ORDER — AMINOPHYLLINE 25 MG/ML IV SOLN
75.0000 mg | Freq: Once | INTRAVENOUS | Status: AC
  Administered 2018-09-21: 75 mg via INTRAVENOUS

## 2018-09-21 MED ORDER — TECHNETIUM TC 99M TETROFOSMIN IV KIT
11.0000 | PACK | Freq: Once | INTRAVENOUS | Status: AC | PRN
  Administered 2018-09-21: 11 via INTRAVENOUS
  Filled 2018-09-21: qty 11

## 2018-09-21 MED ORDER — TECHNETIUM TC 99M TETROFOSMIN IV KIT
31.0000 | PACK | Freq: Once | INTRAVENOUS | Status: AC | PRN
  Administered 2018-09-21: 31 via INTRAVENOUS
  Filled 2018-09-21: qty 31

## 2018-09-23 DIAGNOSIS — J449 Chronic obstructive pulmonary disease, unspecified: Secondary | ICD-10-CM | POA: Diagnosis not present

## 2018-09-26 ENCOUNTER — Ambulatory Visit (HOSPITAL_COMMUNITY): Payer: Medicare PPO

## 2018-10-05 ENCOUNTER — Ambulatory Visit: Payer: Medicare PPO | Admitting: Cardiology

## 2018-10-19 ENCOUNTER — Other Ambulatory Visit (HOSPITAL_COMMUNITY): Payer: Medicare PPO

## 2018-10-23 ENCOUNTER — Ambulatory Visit (INDEPENDENT_AMBULATORY_CARE_PROVIDER_SITE_OTHER): Payer: Medicare PPO | Admitting: Family Medicine

## 2018-10-23 ENCOUNTER — Other Ambulatory Visit: Payer: Self-pay

## 2018-10-23 ENCOUNTER — Encounter: Payer: Self-pay | Admitting: Family Medicine

## 2018-10-23 DIAGNOSIS — E785 Hyperlipidemia, unspecified: Secondary | ICD-10-CM

## 2018-10-23 DIAGNOSIS — J439 Emphysema, unspecified: Secondary | ICD-10-CM | POA: Diagnosis not present

## 2018-10-23 DIAGNOSIS — J441 Chronic obstructive pulmonary disease with (acute) exacerbation: Secondary | ICD-10-CM

## 2018-10-23 DIAGNOSIS — I10 Essential (primary) hypertension: Secondary | ICD-10-CM | POA: Diagnosis not present

## 2018-10-23 DIAGNOSIS — E039 Hypothyroidism, unspecified: Secondary | ICD-10-CM

## 2018-10-23 MED ORDER — PREDNISONE 20 MG PO TABS: ORAL_TABLET | ORAL | 0 refills | Status: DC

## 2018-10-23 MED ORDER — AMOXICILLIN-POT CLAVULANATE 875-125 MG PO TABS: 1.0000 | ORAL_TABLET | Freq: Two times a day (BID) | ORAL | 0 refills | Status: DC

## 2018-10-23 NOTE — Progress Notes (Signed)
Virtual Visit via telephone Note  I connected with Tasha Clarke on 10/23/18 at 0844 by telephone and verified that I am speaking with the correct person using two identifiers. Tasha Clarke is currently located at home and no other people are currently with her during visit. The provider, Fransisca Kaufmann Cleon Signorelli, MD is located in their office at time of visit.  Call ended at [phone]  I discussed the limitations, risks, security and privacy concerns of performing an evaluation and management service by telephone and the availability of in person appointments. I also discussed with the patient that there may be a patient responsible charge related to this service. The patient expressed understanding and agreed to proceed.   History and Present Illness: Cough that has been going for 4 weeks and has worsened wheezing.  Had fever 4 weeks ago but none since.  No fevers since.  She has a deep cough and it is not improving despite nyquil.  She has not any sick contacts with any coronavirus.  She denies any worsening SOB.  She is taking trelegy and feels like it is helping.  She feels like her breathing is worsened  Hypertension Patient is currently on metoprolol, and their blood pressure today is 130s. Patient denies any lightheadedness or dizziness. Patient denies headaches, blurred vision, chest pains, shortness of breath, or weakness. Denies any side effects from medication and is content with current medication.   Hypothyroidism recheck Patient is coming in for thyroid recheck today as well. They deny any issues with hair changes or heat or cold problems or diarrhea or constipation. They deny any chest pain or palpitations. They are currently on levothyroxine 66micrograms   Hyperlipidemia Patient is coming in for recheck of his hyperlipidemia. The patient is currently taking rosuvastatin and fish oils. They deny any issues with myalgias or history of liver damage from it. They deny  any focal numbness or weakness or chest pain.   No diagnosis found.  Outpatient Encounter Medications as of 10/23/2018  Medication Sig  . albuterol (PROVENTIL HFA) 108 (90 Base) MCG/ACT inhaler Inhale into the lungs.  Marland Kitchen alendronate (FOSAMAX) 70 MG tablet Take 1 tablet (70 mg total) by mouth every Monday. Take with a full glass of water on an empty stomach.  Marland Kitchen aspirin EC 81 MG tablet Take 81 mg by mouth daily.  . buprenorphine (BUTRANS) 10 MCG/HR PTWK patch Place 10 mcg onto the skin once a week.  . butalbital-acetaminophen-caffeine (FIORICET, ESGIC) 50-325-40 MG tablet Take by mouth 2 (two) times daily as needed for headache or migraine.  . Cholecalciferol (VITAMIN D-3) 5000 units TABS Take 5,000 Units by mouth daily.  . Cyanocobalamin (VITAMIN B-12 PO) Take by mouth daily.  . cyanocobalamin 100 MCG tablet Take 100 mcg by mouth.  . cyclobenzaprine (FLEXERIL) 10 MG tablet TAKE 1 TABLET BY MOUTH TWICE DAILY AS NEEDED FOR MUSCLE SPASM  . diphenhydramine-acetaminophen (TYLENOL PM EXTRA STRENGTH) 25-500 MG TABS tablet Take 1 tablet by mouth at bedtime as needed (pain).   . DULoxetine (CYMBALTA) 30 MG capsule Take 3 capsules (90 mg total) by mouth daily.  Marland Kitchen EPINEPHrine 0.3 mg/0.3 mL IJ SOAJ injection Inject 0.3 mg into the muscle as needed.  . Ferrous Sulfate (IRON) 325 (65 Fe) MG TABS Take 1 tablet (325 mg total) by mouth daily.  . fluticasone (FLONASE) 50 MCG/ACT nasal spray Place 2 sprays into both nostrils daily.  . Fluticasone-Salmeterol (ADVAIR) 250-50 MCG/DOSE AEPB Inhale 1 puff into the lungs 2 (two) times  daily.  . Fluticasone-Umeclidin-Vilant (TRELEGY ELLIPTA) 100-62.5-25 MCG/INH AEPB Inhale into the lungs 1 day or 1 dose.  . folic acid (FOLVITE) 1 MG tablet Take 1 mg by mouth daily.  Marland Kitchen gabapentin (NEURONTIN) 600 MG tablet Take 1 tablet (600 mg total) by mouth 4 (four) times daily.  Marland Kitchen ibuprofen (ADVIL,MOTRIN) 200 MG tablet Take 200 mg by mouth every 6 (six) hours as needed.  Marland Kitchen  ipratropium-albuterol (DUONEB) 0.5-2.5 (3) MG/3ML SOLN Take 3 mLs by nebulization every 6 (six) hours as needed (shortness of breathe).   . levETIRAcetam (KEPPRA) 1000 MG tablet TAKE 1 TABLET BY MOUTH AT BEDTIME  . levETIRAcetam (KEPPRA) 750 MG tablet Take 750 mg by mouth every morning.  Marland Kitchen levothyroxine (SYNTHROID, LEVOTHROID) 50 MCG tablet Take 1 tablet (50 mcg total) by mouth daily before breakfast.  . Magnesium 400 MG CAPS Take 400 mg by mouth daily.  . Melatonin 10 MG TABS Take by mouth.  . metoprolol tartrate (LOPRESSOR) 25 MG tablet Take 12.5 mg by mouth 2 (two) times daily.  . Multiple Vitamin (MULTIVITAMIN) tablet Take 1 tablet by mouth daily.  . Multiple Vitamins-Minerals (MULTIVITAMIN ADULTS PO) Take 1 tablet by mouth as directed.  . naloxone (NARCAN) nasal spray 4 mg/0.1 mL Place into the nose.  . Omega-3 Fatty Acids (FISH OIL) 1000 MG CAPS Take 1,000 mg by mouth daily.  . potassium chloride (K-DUR) 10 MEQ tablet Take 1 tablet by mouth daily.  . rosuvastatin (CRESTOR) 40 MG tablet Take 1 tablet (40 mg total) by mouth daily.   No facility-administered encounter medications on file as of 10/23/2018.     Review of Systems  Constitutional: Negative for chills and fever.  HENT: Positive for congestion. Negative for sinus pressure, sinus pain and sore throat.   Eyes: Negative for visual disturbance.  Respiratory: Positive for cough, shortness of breath and wheezing. Negative for chest tightness.   Cardiovascular: Negative for chest pain and leg swelling.  Musculoskeletal: Negative for back pain and gait problem.  Skin: Negative for rash.  Neurological: Negative for dizziness, light-headedness and headaches.  Psychiatric/Behavioral: Negative for agitation and behavioral problems.  All other systems reviewed and are negative.   Observations/Objective: Patient sounds coughing and wheezing while talking to her over the phone.  Assessment and Plan: Problem List Items Addressed This  Visit      Cardiovascular and Mediastinum   Essential hypertension     Respiratory   COPD (chronic obstructive pulmonary disease) (HCC)   Relevant Medications   predniSONE (DELTASONE) 20 MG tablet     Endocrine   Hypothyroid - Primary     Other   Hyperlipidemia LDL goal <100    Other Visit Diagnoses    COPD exacerbation ([place])       Relevant Medications   predniSONE (DELTASONE) 20 MG tablet   amoxicillin-clavulanate (AUGMENTIN) 875-125 MG tablet       Follow Up Instructions:  We will treat like a COPD exacerbation and continue her Trelegy  Continue other current medications at this point.   I discussed the assessment and treatment plan with the patient. The patient was provided an opportunity to ask questions and all were answered. The patient agreed with the plan and demonstrated an understanding of the instructions.   The patient was advised to call back or seek an in-person evaluation if the symptoms worsen or if the condition fails to improve as anticipated.  The above assessment and management plan was discussed with the patient. The patient verbalized understanding of and  has agreed to the management plan. Patient is aware to call the clinic if symptoms persist or worsen. Patient is aware when to return to the clinic for a follow-up visit. Patient educated on when it is appropriate to go to the emergency department.    I provided 11 minutes of non-face-to-face time during this encounter.    Worthy Rancher, MD

## 2018-10-24 DIAGNOSIS — J449 Chronic obstructive pulmonary disease, unspecified: Secondary | ICD-10-CM | POA: Diagnosis not present

## 2018-10-26 ENCOUNTER — Ambulatory Visit (HOSPITAL_COMMUNITY): Payer: Medicare PPO | Admitting: Hematology

## 2018-11-07 ENCOUNTER — Telehealth: Payer: Self-pay | Admitting: Cardiology

## 2018-11-07 DIAGNOSIS — I10 Essential (primary) hypertension: Secondary | ICD-10-CM | POA: Diagnosis not present

## 2018-11-07 DIAGNOSIS — J301 Allergic rhinitis due to pollen: Secondary | ICD-10-CM | POA: Diagnosis not present

## 2018-11-07 DIAGNOSIS — J449 Chronic obstructive pulmonary disease, unspecified: Secondary | ICD-10-CM | POA: Diagnosis not present

## 2018-11-07 DIAGNOSIS — F419 Anxiety disorder, unspecified: Secondary | ICD-10-CM | POA: Diagnosis not present

## 2018-11-07 NOTE — Telephone Encounter (Signed)
sw husband to r/s March visit.  Patient needs to have carotid and LE arterial dopplers prior to seeing dr. Percival Spanish.

## 2018-11-14 DIAGNOSIS — M47812 Spondylosis without myelopathy or radiculopathy, cervical region: Secondary | ICD-10-CM | POA: Diagnosis not present

## 2018-11-14 DIAGNOSIS — M961 Postlaminectomy syndrome, not elsewhere classified: Secondary | ICD-10-CM | POA: Diagnosis not present

## 2018-11-14 DIAGNOSIS — Z9689 Presence of other specified functional implants: Secondary | ICD-10-CM | POA: Diagnosis not present

## 2018-11-21 ENCOUNTER — Other Ambulatory Visit: Payer: Self-pay | Admitting: Family Medicine

## 2018-11-21 DIAGNOSIS — F339 Major depressive disorder, recurrent, unspecified: Secondary | ICD-10-CM

## 2018-11-22 NOTE — Telephone Encounter (Signed)
Pt last televisit 10/23/18-last rx'd 08/24/18 #30 with 2 refills

## 2018-11-23 ENCOUNTER — Other Ambulatory Visit: Payer: Self-pay | Admitting: Family Medicine

## 2018-11-23 DIAGNOSIS — J449 Chronic obstructive pulmonary disease, unspecified: Secondary | ICD-10-CM | POA: Diagnosis not present

## 2018-12-11 ENCOUNTER — Other Ambulatory Visit: Payer: Self-pay

## 2018-12-11 ENCOUNTER — Ambulatory Visit (INDEPENDENT_AMBULATORY_CARE_PROVIDER_SITE_OTHER): Payer: Medicare PPO | Admitting: *Deleted

## 2018-12-11 DIAGNOSIS — Z0000 Encounter for general adult medical examination without abnormal findings: Secondary | ICD-10-CM

## 2018-12-11 NOTE — Progress Notes (Addendum)
MEDICARE ANNUAL WELLNESS VISIT  12/11/2018  Telephone Visit Disclaimer This Medicare AWV was conducted by telephone due to national recommendations for restrictions regarding the COVID-19 Pandemic (e.g. social distancing).  I verified, using two identifiers, that I am speaking with Tasha Clarke or their authorized healthcare agent. I discussed the limitations, risks, security, and privacy concerns of performing an evaluation and management service by telephone and the potential availability of an in-person appointment in the future. The patient expressed understanding and agreed to proceed.   Subjective:  Tasha Clarke is a 68 y.o. female patient of Dettinger, Fransisca Kaufmann, MD who had a Medicare Annual Wellness Visit today via telephone. Tasha Clarke is Retired and lives with their spouse and son. she has 5 children which includes her 3 stepchildren. she reports that she is socially active and does interact with friends/family regularly. she is not physically active and enjoys reading books, putting puzzles together and bead work.  Patient Care Team: Dettinger, Fransisca Kaufmann, MD as PCP - General (Family Medicine) Daneil Dolin, MD as Consulting Physician (Gastroenterology)  Advanced Directives 12/11/2018 07/10/2018 06/30/2018 06/21/2018 05/17/2018 05/17/2018 03/29/2018  Does Patient Have a Medical Advance Directive? Yes Yes No No Yes Yes Yes  Type of Industrial/product designer of [place];Living will Living will;Healthcare Power of [place];Living will [place];Living will [place];Living will  Does patient want to make changes to medical advance directive? No - Patient declined - No - Patient declined - No - Patient declined - No - Patient declined  Copy of [place] in Chart? Yes - validated most recent copy scanned in chart (See row information) No - copy  requested No - copy requested - No - copy requested - No - copy requested  Would patient like information on creating a medical advance directive? - No - Patient declined No - Patient declined No - Patient declined - - No - Patient declined    Hospital Utilization Over the Past 12 Months: # of hospitalizations or ER visits: 3 # of surgeries: 1  Review of Systems    Patient reports that her overall health is better compared to last year.  Patient Reported Readings (BP, Pulse, CBG, Weight, etc) none  Review of Systems: Musculoskeletal ROS: positive for - joint pain  All other systems negative.  Pain Assessment Pain : 0-10 Pain Score: 7  Pain Type: Chronic pain Pain Location: Other (Comment)(lower back, neck ) Pain Orientation: Other (Comment)(arthritis pain) Pain Descriptors / Indicators: Constant, Shooting, Sharp Pain Onset: More than a month ago Pain Frequency: Constant Pain Relieving Factors: pt goes to pain management and she "wears a pain patch" Effect of Pain on Daily Activities: she depends on her husband, son and sons girlfriend take care of the house because she can't do it due to the pain  Pain Relieving Factors: pt goes to pain management and she "wears a pain patch"  Current Medications & Allergies (verified) Allergies as of 12/11/2018      Reactions   Cephalexin Hives   Ketorolac Tromethamine Hives   Lac Bovis Nausea And Vomiting   Imdur  [isosorbide Dinitrate]    Iodinated Diagnostic Agents    Other Other (See Comments)   Alka seltzer causes blurred vision, fainting   Sodium Bicarbonate-citric Acid    Milk-related Compounds Nausea And Vomiting      Medication List       Accurate  as of December 11, 2018  9:20 AM. If you have any questions, ask your nurse or doctor.        STOP taking these medications   amoxicillin-clavulanate 875-125 MG tablet Commonly known as:  AUGMENTIN   predniSONE 20 MG tablet Commonly known as:  DELTASONE     TAKE these  medications   alendronate 70 MG tablet Commonly known as:  FOSAMAX Take 1 tablet (70 mg total) by mouth every Monday. Take with a full glass of water on an empty stomach.   aspirin EC 81 MG tablet Take 81 mg by mouth daily.   butalbital-acetaminophen-caffeine 50-325-40 MG tablet Commonly known as:  FIORICET Take by mouth 2 (two) times daily as needed for headache or migraine.   Butrans 10 MCG/HR Ptwk patch Generic drug:  buprenorphine Place 10 mcg onto the skin once a week.   cyclobenzaprine 10 MG tablet Commonly known as:  FLEXERIL TAKE 1 TABLET BY MOUTH TWICE DAILY AS NEEDED FOR MUSCLE SPASM   DULoxetine 30 MG capsule Commonly known as:  CYMBALTA TAKE 3 CAPSULES BY MOUTH ONCE DAILY   EPINEPHrine 0.3 mg/0.3 mL Soaj injection Commonly known as:  EPI-PEN Inject 0.3 mg into the muscle as needed.   EQ Allergy Relief (Cetirizine) 10 MG tablet Generic drug:  cetirizine Take 1 tablet by mouth once daily   Fish Oil 1000 MG Caps Take 1,000 mg by mouth daily.   fluticasone 50 MCG/ACT nasal spray Commonly known as:  Flonase Place 2 sprays into both nostrils daily.   folic acid 1 MG tablet Commonly known as:  FOLVITE Take 1 mg by mouth daily.   gabapentin 600 MG tablet Commonly known as:  NEURONTIN Take 1 tablet (600 mg total) by mouth 4 (four) times daily.   ibuprofen 200 MG tablet Commonly known as:  ADVIL Take 200 mg by mouth every 6 (six) hours as needed.   ipratropium-albuterol 0.5-2.5 (3) MG/3ML Soln Commonly known as:  DUONEB Take 3 mLs by nebulization every 6 (six) hours as needed (shortness of breathe).   Iron 325 (65 Fe) MG Tabs Take 1 tablet (325 mg total) by mouth daily.   levETIRAcetam 750 MG tablet Commonly known as:  KEPPRA Take 750 mg by mouth every morning.   levETIRAcetam 1000 MG tablet Commonly known as:  KEPPRA TAKE 1 TABLET BY MOUTH AT BEDTIME   levothyroxine 50 MCG tablet Commonly known as:  SYNTHROID Take 1 tablet (50 mcg total) by mouth  daily before breakfast.   Magnesium 400 MG Caps Take 400 mg by mouth daily.   Melatonin 10 MG Tabs Take by mouth.   metoprolol tartrate 25 MG tablet Commonly known as:  LOPRESSOR Take 12.5 mg by mouth 2 (two) times daily.   multivitamin tablet Take 1 tablet by mouth daily.   Narcan 4 MG/0.1ML Liqd nasal spray kit Generic drug:  naloxone Place into the nose.   ondansetron 4 MG tablet Commonly known as:  ZOFRAN TAKE 1 TABLET BY MOUTH 4 TIMES DAILY AS NEEDED   potassium chloride 10 MEQ tablet Commonly known as:  K-DUR Take 1 tablet by mouth daily.   Proventil HFA 108 (90 Base) MCG/ACT inhaler Generic drug:  albuterol Inhale into the lungs.   rosuvastatin 40 MG tablet Commonly known as:  CRESTOR Take 1 tablet (40 mg total) by mouth daily.   Trelegy Ellipta 100-62.5-25 MCG/INH Aepb Generic drug:  Fluticasone-Umeclidin-Vilant Inhale into the lungs 1 day or 1 dose.   Tylenol PM Extra Strength 25-500 MG  Tabs tablet Generic drug:  diphenhydramine-acetaminophen Take 1 tablet by mouth at bedtime as needed (pain).   VITAMIN B-12 PO Take by mouth daily.   cyanocobalamin 100 MCG tablet Take 100 mcg by mouth.   Vitamin D-3 125 MCG (5000 UT) Tabs Take 5,000 Units by mouth daily.       History (reviewed): Past Medical History:  Diagnosis Date   Allergy    Anemia    Anxiety    Arthritis    Asthma    Blood transfusion without reported diagnosis    Cataract    CHF (congestive heart failure) (HCC)    Chronic kidney disease    Clotting disorder (HCC)    COPD (chronic obstructive pulmonary disease) ([place])    Depression    Diabetes mellitus without complication ([place])    pt states she is not diabetic    Emphysema of lung (HCC)    GERD (gastroesophageal reflux disease)    Hyperlipidemia    Hypertension    Neuromuscular disorder (HCC)    Osteoporosis    Seizures (HCC)    Skin cancer    2 areas removed more than 10 years ago   Sleep apnea 1985    wears cpap nightly   Thyroid disease    Past Surgical History:  Procedure Laterality Date   APPENDECTOMY     BIOPSY  03/16/2018   Procedure: BIOPSY;  Surgeon: Daneil Dolin, MD;  [facility];  Service: Endoscopy;;  gastric bx's   CAROTID ENDARTERECTOMY Bilateral    CERVICAL SPINE SURGERY     after neck fracture   ESOPHAGOGASTRODUODENOSCOPY (EGD) WITH PROPOFOL N/A 03/16/2018   Procedure: ESOPHAGOGASTRODUODENOSCOPY (EGD) WITH PROPOFOL;  Surgeon: Daneil Dolin, MD;  Location: [facility];  Service: Endoscopy;  Laterality: N/A;  3:00pm   EYE SURGERY     FRACTURE SURGERY     heart stents     JOINT REPLACEMENT     Right knee   SPINAL CORD STIMULATOR IMPLANT  2015   SPINE SURGERY     lumbar x4    Family History  Adopted: Yes  Problem Relation Age of Onset   Arthritis Mother    Asthma Mother    Cancer Mother        lung   Depression Mother    Diabetes Mother    Arthritis Father    Asthma Father    Depression Father    Asthma Sister    Depression Sister    Diabetes Sister    Arthritis Maternal Grandmother    Asthma Maternal Grandmother    Depression Maternal Grandmother    Arthritis Maternal Grandfather    Asthma Maternal Grandfather    Arthritis Paternal Grandmother    Asthma Paternal Grandmother    Arthritis Paternal Grandfather    Asthma Paternal Grandfather    COPD Paternal Grandfather    Diabetes Sister    Mental illness Sister    Anemia Sister    Hypertension Son    Post-traumatic stress disorder Son    Allergies Daughter    Breast cancer Neg Hx    Seizures Neg Hx        not that she knows of, pt adopted   Social History   Socioeconomic History   Marital status: Married    Spouse name: Not on file   Number of children: 2   Years of education: 13   Highest education level: Some college, no degree  Occupational History   Occupation: Retired  Scientific laboratory technician  Financial resource strain: Not hard  at all   Food insecurity:    Worry: Never true    Inability: Never true   Transportation needs:    Medical: No    Non-medical: No  Tobacco Use   Smoking status: Current Every Day Smoker    Packs/day: 1.00    Years: 43.00    Pack years: 43.00    Types: Cigarettes   Smokeless tobacco: Never Used  Substance and Sexual Activity   Alcohol use: Yes    Frequency: Never    Comment: occasionally, not even 1 drink per month    Drug use: No   Sexual activity: Not Currently    Birth control/protection: Post-menopausal  Lifestyle   Physical activity:    Days per week: 0 days    Minutes per session: 0 min   Stress: To some extent  Relationships   Social connections:    Talks on phone: More than three times a week    Gets together: Three times a week    Attends religious service: Never    Active member of club or organization: No    Attends meetings of clubs or organizations: Never    Relationship status: Married  Other Topics Concern   Not on file  Social History Narrative   Lives at home with husband and son   Right hand dominant   Caffeine: 4-5 cups daily (coffee & mtn dew)    Activities of Daily Living In your present state of health, do you have any difficulty performing the following activities: 12/11/2018 05/17/2018  Hearing? N N  Comment - -  Vision? Y N  Comment last eye exam 3 years ago-she says she know she needs to make an appt -  Difficulty concentrating or making decisions? N N  Walking or climbing stairs? Y Y  Comment due to pain, and left ankle problem -  Dressing or bathing? N Y  Doing errands, shopping? [place] Don[place]  Comment husband is usually with her and does most of the grocery shopping and errands -  Conservation officer, nature and eating ? Y -  Comment her family does all the cooking, but she can feed herself -  Using the Toilet? N -  In the past six months, have you accidently leaked urine? Y -  Comment she is incontinent and wears "diapers" all the time -  Do  you have problems with loss of bowel control? N -  Managing your Medications? N -  Managing your Finances? Y -  Comment husband takes care of the finances -  Housekeeping or managing your Housekeeping? Y -  Comment family takes care of all the house work -  Some recent data might be hidden    Patient Literacy How often do you need to have someone help you when you read instructions, pamphlets, or other written materials from your doctor or pharmacy?: 1 - Never What is the last grade level you completed in school?: graduated high school and did 1 year of college  Exercise Current Exercise Habits: The patient does not participate in regular exercise at present, Exercise limited by: orthopedic condition(s);respiratory conditions(s)  Diet Patient reports consuming 2 meals a day and 0 snack(s) a day Patient reports that her primary diet is: Regular Patient reports that she does have regular access to food.   Depression Screen PHQ 2/9 Scores 12/11/2018 07/26/2018 05/24/2018 05/17/2018 03/16/2018 02/13/2018 02/07/2018  PHQ - 2 Score _0 0 2 0  PHQ- 9 Score  _0 - 11 -     Fall Risk Fall Risk  12/11/2018 07/26/2018 03/16/2018 02/13/2018 02/07/2018  Falls in the past year? 1 1 Yes Yes Yes  Number falls in past yr: _1 Injury with Fall? 0 - No No No  Comment - - - - -  Risk for fall due to : History of fall(s);Impaired mobility - - - History of fall(s)  Follow up - - - - Education provided;Falls evaluation completed;Falls prevention discussed     Objective:  Tasha Clarke seemed alert and oriented and she participated appropriately during our telephone visit.  Blood Pressure Weight BMI  BP Readings from Last 3 Encounters:  08/28/18 120/64  08/07/18 96/60  08/02/18 (!) 160/0   Wt Readings from Last 3 Encounters:  09/21/18 225 lb (102.1 kg)  08/28/18 225 lb (102.1 kg)  07/31/18 220 lb (99.8 kg)   BMI Readings from Last 1 Encounters:  09/21/18 37.44 kg/m    *Unable  to obtain current vital signs, weight, and BMI due to telephone visit type  Hearing/Vision   Tasha Clarke did not seem to have difficulty with hearing/understanding during the telephone conversation  Reports that she has not had a formal eye exam by an eye care professional within the past year  Reports that she has not had a formal hearing evaluation within the past year *Unable to fully assess hearing and vision during telephone visit type  Cognitive Function: 6CIT Screen 12/11/2018  What Year? 0 points  What month? 0 points  What time? 0 points  Count back from 20 0 points  Months in reverse 0 points  Repeat phrase 4 points  Total Score 4    Normal Cognitive Function Screening: Yes (Normal:0-7, Significant for Dysfunction: >8)  Immunization & Health Maintenance Record Immunization History  Administered Date(s) Administered   Influenza, High Dose Seasonal PF 05/18/2018   Influenza-Unspecified 05/22/2015, 09/14/2016   Pneumococcal Polysaccharide-23 05/22/2015   Tdap 02/11/2017   Zoster 06/17/2016    Health Maintenance  Topic Date Due   Hepatitis C Screening  1951/05/28   URINE MICROALBUMIN  12/09/1961   DEXA SCAN  12/09/2016   INFLUENZA VACCINE  02/10/2019   MAMMOGRAM  04/25/2020   PNA vac Low Risk Adult (2 of 2 - PPSV23) 05/21/2020   COLONOSCOPY  01/24/2021   TETANUS/TDAP  02/12/2027       Assessment  This is a routine wellness examination for [place].  Health Maintenance: Due or Overdue Health Maintenance Due  Topic Date Due   Hepatitis C Screening  10-12-1950   URINE MICROALBUMIN  12/09/1961   DEXA SCAN  12/09/2016    Tasha Clarke does not need a referral for Community Assistance: Care Management:   no Social Work:    no Prescription Assistance:  no Nutrition/Diabetes Education:  no   Plan:  Personalized Goals Goals Addressed            This Visit's Progress    DIET - INCREASE WATER INTAKE         Personalized Health Maintenance & Screening Recommendations  Pneumococcal vaccine  Bone densitometry screening Hep C Screening  Lung Cancer Screening Recommended: yes but pt declined at this time (Low Dose CT Chest recommended if Age 13-80 years, 30 pack-year currently smoking OR have quit w/in past 15 years) Hepatitis C Screening recommended: yes-pt will have drawn at next visit with PCP HIV Screening recommended: no  Advanced Directives: Written information was not  prepared per patient's request.  Referrals & Orders No orders of the defined types were placed in this encounter.   Follow-up Plan  Follow-up with Dettinger, Fransisca Kaufmann, MD as planned  Schedule eye exam   Will discuss DEXA, Hep C screening and Prevnar at next office visit with PCP.   I have personally reviewed and noted the following in the patients chart:    Medical and social history  Use of alcohol, tobacco or illicit drugs   Current medications and supplements  Functional ability and status  Nutritional status  Physical activity  Advanced directives  List of other physicians  Hospitalizations, surgeries, and ER visits in previous 12 months  Vitals  Screenings to include cognitive, depression, and falls  Referrals and appointments  In addition, I have reviewed and discussed with Tasha Clarke certain preventive protocols, quality metrics, and best practice recommendations. A written personalized care plan for preventive services as well as general preventive health recommendations is available and can be mailed to the patient at her request.      Tasha Clarke  12/11/2018    I have reviewed and agree with the above AWV documentation.   Evelina Dun, FNP

## 2018-12-11 NOTE — Patient Instructions (Signed)
Preventive Care 42 Years and Older, Female Preventive care refers to lifestyle choices and visits with your health care provider that can promote health and wellness. What does preventive care include?  A yearly physical exam. This is also called an annual well check.  Dental exams once or twice a year.  Routine eye exams. Ask your health care provider how often you should have your eyes checked.  Personal lifestyle choices, including: ? Daily care of your teeth and gums. ? Regular physical activity. ? Eating a healthy diet. ? Avoiding tobacco and drug use. ? Limiting alcohol use. ? Practicing safe sex. ? Taking low-dose aspirin every day. ? Taking vitamin and mineral supplements as recommended by your health care provider. What happens during an annual well check? The services and screenings done by your health care provider during your annual well check will depend on your age, overall health, lifestyle risk factors, and family history of disease. Counseling Your health care provider may ask you questions about your:  Alcohol use.  Tobacco use.  Drug use.  Emotional well-being.  Home and relationship well-being.  Sexual activity.  Eating habits.  History of falls.  Memory and ability to understand (cognition).  Work and work Statistician.  Reproductive health.  Screening You may have the following tests or measurements:  Height, weight, and BMI.  Blood pressure.  Lipid and cholesterol levels. These may be checked every 5 years, or more frequently if you are over 30 years old.  Skin check.  Lung cancer screening. You may have this screening every year starting at age 27 if you have a 30-pack-year history of smoking and currently smoke or have quit within the past 15 years.  Colorectal cancer screening. All adults should have this screening starting at age 33 and continuing until age 46. You will have tests every 1-10 years, depending on your results and the  type of screening test. People at increased risk should start screening at an earlier age. Screening tests may include: ? Guaiac-based fecal occult blood testing. ? Fecal immunochemical test (FIT). ? Stool DNA test. ? Virtual colonoscopy. ? Sigmoidoscopy. During this test, a flexible tube with a tiny camera (sigmoidoscope) is used to examine your rectum and lower colon. The sigmoidoscope is inserted through your anus into your rectum and lower colon. ? Colonoscopy. During this test, a long, thin, flexible tube with a tiny camera (colonoscope) is used to examine your entire colon and rectum.  Hepatitis C blood test.  Hepatitis B blood test.  Sexually transmitted disease (STD) testing.  Diabetes screening. This is done by checking your blood sugar (glucose) after you have not eaten for a while (fasting). You may have this done every 1-3 years.  Bone density scan. This is done to screen for osteoporosis. You may have this done starting at age 37.  Mammogram. This may be done every 1-2 years. Talk to your health care provider about how often you should have regular mammograms. Talk with your health care provider about your test results, treatment options, and if necessary, the need for more tests. Vaccines Your health care provider may recommend certain vaccines, such as:  Influenza vaccine. This is recommended every year.  Tetanus, diphtheria, and acellular pertussis (Tdap, Td) vaccine. You may need a Td booster every 10 years.  Varicella vaccine. You may need this if you have not been vaccinated.  Zoster vaccine. You may need this after age 38.  Measles, mumps, and rubella (MMR) vaccine. You may need at least  one dose of MMR if you were born in 1957 or later. You may also need a second dose.  Pneumococcal 13-valent conjugate (PCV13) vaccine. One dose is recommended after age 24.  Pneumococcal polysaccharide (PPSV23) vaccine. One dose is recommended after age 24.  Meningococcal  vaccine. You may need this if you have certain conditions.  Hepatitis A vaccine. You may need this if you have certain conditions or if you travel or work in places where you may be exposed to hepatitis A.  Hepatitis B vaccine. You may need this if you have certain conditions or if you travel or work in places where you may be exposed to hepatitis B.  Haemophilus influenzae type b (Hib) vaccine. You may need this if you have certain conditions. Talk to your health care provider about which screenings and vaccines you need and how often you need them. This information is not intended to replace advice given to you by your health care provider. Make sure you discuss any questions you have with your health care provider. Document Released: 07/25/2015 Document Revised: 08/18/2017 Document Reviewed: 04/29/2015 Elsevier Interactive Patient Education  2019 [employer].

## 2018-12-19 ENCOUNTER — Ambulatory Visit: Payer: Medicare PPO | Admitting: Nurse Practitioner

## 2018-12-20 ENCOUNTER — Encounter: Payer: Self-pay | Admitting: Nurse Practitioner

## 2018-12-20 ENCOUNTER — Telehealth: Payer: Self-pay

## 2018-12-20 ENCOUNTER — Other Ambulatory Visit: Payer: Self-pay

## 2018-12-20 ENCOUNTER — Ambulatory Visit: Payer: Medicare PPO | Admitting: Nurse Practitioner

## 2018-12-20 ENCOUNTER — Encounter: Payer: Self-pay | Admitting: Internal Medicine

## 2018-12-20 VITALS — BP 118/75 | HR 73 | Temp 97.8°F | Ht 65.0 in

## 2018-12-20 DIAGNOSIS — R1310 Dysphagia, unspecified: Secondary | ICD-10-CM | POA: Diagnosis not present

## 2018-12-20 DIAGNOSIS — R197 Diarrhea, unspecified: Secondary | ICD-10-CM

## 2018-12-20 DIAGNOSIS — R112 Nausea with vomiting, unspecified: Secondary | ICD-10-CM

## 2018-12-20 DIAGNOSIS — R1319 Other dysphagia: Secondary | ICD-10-CM

## 2018-12-20 MED ORDER — DICYCLOMINE HCL 10 MG PO CAPS: 10.0000 mg | ORAL_CAPSULE | Freq: Four times a day (QID) | ORAL | 0 refills | Status: AC | PRN

## 2018-12-20 MED ORDER — ONDANSETRON HCL 4 MG PO TABS: ORAL_TABLET | ORAL | 3 refills | Status: AC

## 2018-12-20 NOTE — Patient Instructions (Addendum)
Your health issues we discussed today were:   Nausea and vomiting: 1. I will send a medication called Zofran to your pharmacy.  You can use this up to every 6 hours as needed for nausea and/or vomiting 2. Call if you have any worsening or severe symptoms including an inability to keep down food and fluids or significant weight loss  Dysphagia (swallowing difficulties): 1. We will schedule an upper endoscopy with possible dilation 2. Further recommendations will be made after your procedure 3. In the meantime, try to eat a soft diet.  Further information is below.  This will help prevent food getting stuck in your esophagus  Diarrhea without abdominal pain: 1. I will put in an order to have stool studies checked for infection 2. We will also send in a medication called Bentyl to help with diarrhea.  Do not start taking Bentyl until we tell you that your stool tests are negative 3. You can also start taking a probiotic to help with not good bacteria.  Start this after you collect your stool samples 4. Call if your symptoms get worse or severe or if you do not have any improvement with the medications  Overall I recommend:  1. Continue your other current medications 2. Return for follow-up in 2 months 3. Call us if you have any questions or concerns.   Because of recent events of COVID-19 ("Coronavirus"), follow CDC recommendations:  1. Wash your hand frequently 2. Avoid touching your face 3. Stay away from people who are sick 4. If you have symptoms such as fever, cough, shortness of breath then call your healthcare provider for further guidance 5. If you are sick, STAY AT HOME unless otherwise directed by your healthcare provider. 6. Follow directions from state and national officials regarding staying safe   At [facility] we value your feedback. You may receive a survey about your visit today. Please share your experience as we strive to create trusting relationships  with our patients to provide genuine, compassionate, quality care.  We appreciate your understanding and patience as we review any laboratory studies, imaging, and other diagnostic tests that are ordered as we care for you. Our office policy is 5 business days for review of these results, and any emergent or urgent results are addressed in a timely manner for your best interest. If you do not hear from our office in 1 week, please contact us.   We also encourage the use of MyChart, which contains your medical information for your review as well. If you are not enrolled in this feature, an access code is on this after visit summary for your convenience. Thank you for allowing [place] to be involved in your care.  It was great to see you today!  I hope you have a great summer!!       Dysphagia Eating Plan, Bite Size Food This diet plan is for people with moderate swallowing problems who have transitioned from pureed and minced foods. Bite size foods are soft and cut into small chunks so that they can be swallowed safely. On this eating plan, you may be instructed to drink liquids that are thickened. Work with your health care provider and your diet and nutrition specialist (dietitian) to make sure that you are following the diet safely and getting all the nutrients you need. What are tips for following this plan? General guidelines for foods   You may eat foods that are tender, soft, and moist.  Always test food texture  before taking a bite. Poke food with a fork or spoon to make sure it is tender.  Food should be easy to cut and shew. Avoid large pieces of food that require a lot of chewing.  Take small bites. Each bite should be smaller than your thumb nail (about 79mm by 15 mm).  If you were on pureed and minced food diet plans, you may eat any of the foods included in those diets.  Avoid foods that are very dry, hard, sticky, chewy, coarse, or crunchy.  If instructed by your health care  provider, thicken liquids. Follow your health care provider's instructions for what products to use, how to do this, and to what thickness. ? Your health care provider may recommend using a commercial thickener, rice cereal, or potato flakes. Ask your health care provider to recommend thickeners. ? Thickened liquids are usually a "pudding-like" consistency, or they may be as thick as honey or thick enough to eat with a spoon. Cooking  To moisten foods, you may add liquids while you are blending, mashing, or grinding your foods to the right consistency. These liquids include gravies, sauces, vegetable or fruit juice, milk, half and half, or water.  Strain extra liquid from foods before eating.  Reheat foods slowly to prevent a tough crust from forming.  Prepare foods in advance. Meal planning  Eat a variety of foods to get all the nutrients you need.  Some foods may be tolerated better than others. Work with your dietitian to identify which foods are safest for you to eat.  Follow your meal plan as told by your dietitian. What foods are allowed? Grains Moist breads without nuts or seeds. Biscuits, muffins, pancakes, and waffles that are well-moistened with syrup, jelly, margarine, or butter. Cooked cereals. Moist bread stuffing. Moist rice. Well-moistened cold cereal with small chunks. Well-cooked pasta, noodles, rice, and bread dressing in small pieces and thick sauce. Soft dumplings or spaetzle in small pieces and butter or gravy. Vegetables Soft, well-cooked vegetables in small pieces. Soft-cooked, mashed potatoes. Thickened vegetable juice. Fruits Canned or cooked fruits that are soft or moist and do not have skin or seeds. Fresh, soft bananas. Thickened fruit juices. Meat and other protein foods Tender, moist meats or poultry in small pieces. Moist meatballs or meatloaf. Fish without bones. Eggs or egg substitutes in small pieces. Tofu. Tempeh and meat alternatives in small pieces.  Well-cooked, tender beans, peas, baked beans, and other legumes. Dairy Thickened milk. Cream cheese. Yogurt. Cottage cheese. Sour cream. Small pieces of soft cheese. Fats and oils Butter. Oils. Margarine. Mayonnaise. Gravy. Spreads. Sweets and desserts Soft, smooth, moist desserts. Pudding. Custard. Moist cakes. Jam. Jelly. Honey. Preserves. Ask your health care provider whether you can have frozen desserts. Seasoning and other foods All seasonings and sweeteners. All sauces with small chunks. Prepared tuna, egg, or chicken salad without raw fruits or vegetables. Moist casseroles with small, tender pieces of meat. Soups with tender meat. What foods are not allowed? Grains Coarse or dry cereals. Dry breads. Toast. Crackers. Tough, crusty breads, such as [place] bread and baguettes. Dry pancakes, waffles, and muffins. Sticky rice. Dry bread stuffing. Granola. Popcorn. Chips. Vegetables All raw vegetables. Cooked corn. Rubbery or stiff cooked vegetables. Stringy vegetables, such as celery. Tough, crisp fried potatoes. Potato skins. Fruits Hard, crunchy, stringy, high-pulp, and juicy raw fruits such as apples, pineapple, papaya, and watermelon. Small, round fruits, such as grapes. Dried fruit and fruit leather. Meat and other protein foods Large pieces of meat.  Dry, tough meats, such as bacon, sausage, and hot dogs. Chicken, [place], or fish with skin and bones. Crunchy peanut butter. Nuts. Seeds. Nut and seed butters. Dairy Yogurt with nuts, seeds, or large chunks. Large chunks of cheese. Frozen desserts and milk consistency not allowed by your dietitian. Sweets and desserts Dry cakes. Chewy or dry cookies. Any desserts with nuts, seeds, dry fruits, coconut, pineapple, or anything dry, sticky, or hard. Chewy caramel. Licorice. Taffy-type candies. Ask your health care provider whether you can have frozen desserts. Seasoning and other foods Soups with tough or large chunks of meats, poultry, or  vegetables. Corn or clam chowder. Smoothies with large chunks of fruit. Summary  Bite size foods can be helpful for people with moderate swallowing problems.  On the dysphagia eating plan, you may eat foods that are soft, moist, and cut into pieces smaller than 30mm by 72mm.  You may be instructed to thicken liquids. Follow your health care provider's instructions about how to do this and to what consistency. This information is not intended to replace advice given to you by your health care provider. Make sure you discuss any questions you have with your health care provider. Document Released: 06/28/2005 Document Revised: 10/08/2016 Document Reviewed: 10/08/2016 Elsevier Interactive Patient Education  2019 [employer].

## 2018-12-20 NOTE — Progress Notes (Signed)
Referring Provider: Dettinger, Fransisca Kaufmann, MD Primary Care Physician:  Dettinger, Fransisca Kaufmann, MD Primary GI:  Dr. Gala Romney  Chief Complaint  Patient presents with  . Abdominal Pain    none in 1 month  . Diarrhea    7-8 times per day, watery, no blood in stool  . Nausea    w/ vomiting    HPI:   Tasha Clarke is a 68 y.o. female who presents for follow-up on abdominal pain and diarrhea.  The patient was last seen in our office 06/16/2018 for the same as well as black/tarry stools.  CT during her recent hospitalization had previously found gastric wall thickening and unable to exclude gastric mass.  Chronic diarrhea comes and goes.  Dark stools on iron.  EGD was completed on 11/28/2017 which found congestive gastropathy and antral deep folds but no identified infiltrating process.  Biopsies found mild reactive gastropathy with erosion negative for H. Pylori.  At her last visit she was doing much better with significant improvement diarrhea that rarely occurs anymore.  Abdominal pain also significantly improved.  She did notice trigger of abdominal symptoms with dairy, specifically milk.  Significant improvement with dairy abstinence.  No further dark stools.  No red flag/warning signs or symptoms.  Dyspnea at baseline.  No other GI complaints.  Recommended continue current medications, avoid dairy and investigate substitute products such as soy, coconut, almond-based products and begin cheese.  Follow-up in 6 months.  The patient states she's doing ok overall. Has been staying at home. Missed hemeatology appointment. Diarrhea restarted a couple months ago. Abdominal pain remains at [place]. Is still avoiding dairy except for creamer in her coffee. Has a bowel movement anywhere from 0 to 7 times a day (highly variable). Cannot name any overt triggers, though thinks green beans may contribute. Stools are loose with solid bits consistent with [place] 6. Denies hematochezia. Does have dark stools on  iron. Some stools are formed, but generally 8/10 stools are loose. Some stools are with urgency postprandially. Denies abdominal pain. Also having some nausea with vomiting, which she states is multiple times a day, but states food "stays in me long enough to get the nutrition out." Denies fever, chills, unintentional weight loss. Denies healthcare exposure, sick contacts. Was on an antibiotic for strep through recently, cannot remember which antibiotic. Denies chest pain, dyspnea, dizziness, lightheadedness, syncope, near syncope. Denies any other upper or lower GI symptoms.   At the end of her visit she mentions in passing "I think my throat needs to be stretched." When asked for more about this she states "everytime I swallow I get choked." Admits solid food dysphagia with occasional regurgitation. Also with pill dysphagia. Last EGD with dilation in [place], [place] about 5-6 years ago.  Past Medical History:  Diagnosis Date  . Allergy   . Anemia   . Anxiety   . Arthritis   . Asthma   . Blood transfusion without reported diagnosis   . Cataract   . CHF (congestive heart failure) ([place])   . Chronic kidney disease   . Clotting disorder ([place])   . COPD (chronic obstructive pulmonary disease) ([place])   . Depression   . Diabetes mellitus without complication ([place])    pt states she is not diabetic   . Emphysema of lung ([place])   . GERD (gastroesophageal reflux disease)   . Hyperlipidemia   . Hypertension   . Neuromuscular disorder ([place])   . Osteoporosis   . Seizures ([place])   .  Skin cancer    2 areas removed more than 10 years ago  . Sleep apnea 1985   wears cpap nightly  . Thyroid disease     Past Surgical History:  Procedure Laterality Date  . APPENDECTOMY    . BIOPSY  03/16/2018   Procedure: BIOPSY;  Surgeon: Daneil Dolin, MD;  Location: [facility];  Service: Endoscopy;;  gastric bx's  . CAROTID ENDARTERECTOMY Bilateral   . CERVICAL SPINE SURGERY     after neck fracture  .  ESOPHAGOGASTRODUODENOSCOPY (EGD) WITH PROPOFOL N/A 03/16/2018   Procedure: ESOPHAGOGASTRODUODENOSCOPY (EGD) WITH PROPOFOL;  Surgeon: Daneil Dolin, MD;  Location: [facility];  Service: Endoscopy;  Laterality: N/A;  3:00pm  . EYE SURGERY    . FRACTURE SURGERY    . heart stents    . JOINT REPLACEMENT     Right knee  . SPINAL CORD STIMULATOR IMPLANT  2015  . SPINE SURGERY     lumbar x4     Current Outpatient Medications  Medication Sig Dispense Refill  . albuterol (PROVENTIL HFA) 108 (90 Base) MCG/ACT inhaler Inhale into the lungs.    Marland Kitchen alendronate (FOSAMAX) 70 MG tablet Take 1 tablet (70 mg total) by mouth every Monday. Take with a full glass of water on an empty stomach. 13 tablet 3  . aspirin EC 81 MG tablet Take 81 mg by mouth daily.    . buprenorphine (BUTRANS) 10 MCG/HR PTWK patch Place 10 mcg onto the skin once a week.    . butalbital-acetaminophen-caffeine (FIORICET, ESGIC) 50-325-40 MG tablet Take by mouth 2 (two) times daily as needed for headache or migraine.    . Cholecalciferol (VITAMIN D-3) 5000 units TABS Take 5,000 Units by mouth daily.    . Cyanocobalamin (VITAMIN B-12 PO) Take by mouth daily.    . cyanocobalamin 100 MCG tablet Take 100 mcg by mouth.    . cyclobenzaprine (FLEXERIL) 10 MG tablet TAKE 1 TABLET BY MOUTH TWICE DAILY AS NEEDED FOR MUSCLE SPASM 30 tablet 0  . diphenhydramine-acetaminophen (TYLENOL PM EXTRA STRENGTH) 25-500 MG TABS tablet Take 1 tablet by mouth at bedtime as needed (pain).     . DULoxetine (CYMBALTA) 30 MG capsule TAKE 3 CAPSULES BY MOUTH ONCE DAILY 90 capsule 0  . EPINEPHrine 0.3 mg/0.3 mL IJ SOAJ injection Inject 0.3 mg into the muscle as needed.    Noelle Penner ALLERGY RELIEF, CETIRIZINE, 10 MG tablet Take 1 tablet by mouth once daily 90 tablet 0  . Ferrous Sulfate (IRON) 325 (65 Fe) MG TABS Take 1 tablet (325 mg total) by mouth daily. 90 each 0  . fluticasone (FLONASE) 50 MCG/ACT nasal spray Place 2 sprays into both nostrils daily. 16 g 2  .  Fluticasone-Umeclidin-Vilant (TRELEGY ELLIPTA) 100-62.5-25 MCG/INH AEPB Inhale into the lungs 1 day or 1 dose.    . folic acid (FOLVITE) 1 MG tablet Take 1 mg by mouth daily.    Marland Kitchen gabapentin (NEURONTIN) 600 MG tablet Take 1 tablet (600 mg total) by mouth 4 (four) times daily. 360 tablet 3  . ibuprofen (ADVIL,MOTRIN) 200 MG tablet Take 200 mg by mouth every 6 (six) hours as needed.    Marland Kitchen ipratropium-albuterol (DUONEB) 0.5-2.5 (3) MG/3ML SOLN Take 3 mLs by nebulization every 6 (six) hours as needed (shortness of breathe).     . levETIRAcetam (KEPPRA) 1000 MG tablet TAKE 1 TABLET BY MOUTH AT BEDTIME 30 tablet 0  . levETIRAcetam (KEPPRA) 750 MG tablet Take 750 mg by mouth every morning.    Marland Kitchen  levothyroxine (SYNTHROID, LEVOTHROID) 50 MCG tablet Take 1 tablet (50 mcg total) by mouth daily before breakfast. 60 tablet 0  . Magnesium 400 MG CAPS Take 400 mg by mouth daily.    . Melatonin 10 MG TABS Take by mouth.    . metoprolol tartrate (LOPRESSOR) 25 MG tablet Take 12.5 mg by mouth 2 (two) times daily.    . Multiple Vitamin (MULTIVITAMIN) tablet Take 1 tablet by mouth daily.    . naloxone (NARCAN) nasal spray 4 mg/0.1 mL Place into the nose.    . Omega-3 Fatty Acids (FISH OIL) 1000 MG CAPS Take 1,000 mg by mouth daily.    . ondansetron (ZOFRAN) 4 MG tablet TAKE 1 TABLET BY MOUTH 4 TIMES DAILY AS NEEDED    . potassium chloride (K-DUR) 10 MEQ tablet Take 1 tablet by mouth daily.    . rosuvastatin (CRESTOR) 40 MG tablet Take 1 tablet (40 mg total) by mouth daily. 90 tablet 3   No current facility-administered medications for this visit.     Allergies as of 12/20/2018 - Review Complete 12/20/2018  Allergen Reaction Noted  . Cephalexin Hives 01/24/2013  . Ketorolac tromethamine Hives 07/26/2017  . [place] Nausea And Vomiting 05/28/2015  . Imdur  [isosorbide dinitrate]    . Iodinated diagnostic agents  05/28/2015  . Other Other (See Comments) 01/24/2013  . Sodium bicarbonate-citric acid  08/24/2017   . Milk-related compounds Nausea And Vomiting 05/28/2015    Family History  Adopted: Yes  Problem Relation Age of Onset  . Arthritis Mother   . Asthma Mother   . Cancer Mother        lung  . Depression Mother   . Diabetes Mother   . Arthritis Father   . Asthma Father   . Depression Father   . Asthma Sister   . Depression Sister   . Diabetes Sister   . Arthritis Maternal Grandmother   . Asthma Maternal Grandmother   . Depression Maternal Grandmother   . Arthritis Maternal Grandfather   . Asthma Maternal Grandfather   . Arthritis Paternal Grandmother   . Asthma Paternal Grandmother   . Arthritis Paternal Grandfather   . Asthma Paternal Grandfather   . COPD Paternal Grandfather   . Diabetes Sister   . Mental illness Sister   . Anemia Sister   . Hypertension Son   . Post-traumatic stress disorder Son   . Allergies Daughter   . Breast cancer Neg Hx   . Seizures Neg Hx        not that she knows of, pt adopted    Social History   Socioeconomic History  . Marital status: Married    Spouse name: Not on file  . Number of children: 2  . Years of education: 52  . Highest education level: Some college, no degree  Occupational History  . Occupation: Retired  Scientific laboratory technician  . Financial resource strain: Not hard at all  . Food insecurity:    Worry: Never true    Inability: Never true  . Transportation needs:    Medical: No    Non-medical: No  Tobacco Use  . Smoking status: Current Every Day Smoker    Packs/day: 1.00    Years: 43.00    Pack years: 43.00    Types: Cigarettes  . Smokeless tobacco: Never Used  Substance and Sexual Activity  . Alcohol use: Yes    Frequency: Never    Comment: occasionally, not even 1 drink per month   .  Drug use: No  . Sexual activity: Not Currently    Birth control/protection: Post-menopausal  Lifestyle  . Physical activity:    Days per week: 0 days    Minutes per session: 0 min  . Stress: To some extent  Relationships  . Social  connections:    Talks on phone: More than three times a week    Gets together: Three times a week    Attends religious service: Never    Active member of club or organization: No    Attends meetings of clubs or organizations: Never    Relationship status: Married  Other Topics Concern  . Not on file  Social History Narrative   Lives at home with husband and son   Right hand dominant   Caffeine: 4-5 cups daily (coffee & mtn dew)    Review of Systems: General: Negative for anorexia, weight loss, fever, chills, fatigue, weakness. ENT: Negative for hoarseness, difficulty swallowing. CV: Negative for chest pain, angina, palpitations, peripheral edema.  Respiratory: Negative for dyspnea at rest, sputum, wheezing.  GI: See history of present illness. Endo: Negative for unusual weight change.  Heme: Negative for bruising or bleeding. Allergy: Negative for rash or hives.   Physical Exam: BP 118/75   Pulse 73   Temp 97.8 F (36.6 C) (Oral)   Ht 5\' 5"  (1.651 m)   BMI 37.44 kg/m  General:   Alert and oriented. Pleasant and cooperative. Well-nourished and well-developed.  Eyes:  Without icterus, sclera clear and conjunctiva pink.  Ears:  Normal auditory acuity. Cardiovascular:  S1, S2 present without murmurs appreciated. Extremities without clubbing or edema. Respiratory:  Clear to auscultation bilaterally. No wheezes, rales, or rhonchi. No distress. Wearing O2 per nasal cannula. Gastrointestinal:  +BS, soft, non-tender and non-distended. No HSM noted. No guarding or rebound. No masses appreciated.  Rectal:  Deferred  Musculoskalatal:  Symmetrical without gross deformities. Skin:  Intact without significant lesions or rashes. Neurologic:  Alert and oriented x4;  grossly normal neurologically. Psych:  Alert and cooperative. Normal mood and affect. Heme/Lymph/Immune: No excessive bruising noted.    12/20/2018 8:42 AM   Disclaimer: This note was dictated with voice recognition  software. Similar sounding words can inadvertently be transcribed and may not be corrected upon review.

## 2018-12-20 NOTE — Assessment & Plan Note (Signed)
The patient notes solid food and pill dysphagia.  States she has had her esophagus stretched at least a couple times.  Last time was 5 to 6 years ago in [place], [place].  Most recent EGD completed by our office in September 2019 but no symptoms of dysphasia so no esophageal dilation was accomplished.  I will give her recommendations for soft diet until she can have her procedure.  States her breathing is at baseline currently.  Chronic oxygen.  We will proceed with EGD with possible dilation in the near future.  Proceed with EGD +/- dilation on propofol/MAC with Dr. Gala Romney in near future: the risks, benefits, and alternatives have been discussed with the patient in detail. The patient states understanding and desires to proceed.  The patient is currently on Flexeril, Cymbalta, Neurontin.  No other anticoagulants, anxiolytics, chronic pain medications, or antidepressants.  Denies alcohol and drug use other than social alcohol about once a month.  Given her complex medical history and medications we will plan for the procedure on propofol/MAC to promote adequate sedation and medical monitoring.

## 2018-12-20 NOTE — Telephone Encounter (Signed)
Tried to call pt to inform of pre-op appt 02/08/19 at 2:15pm, no answer, LMOVM. Letter mailed.

## 2018-12-20 NOTE — Assessment & Plan Note (Signed)
Describes frequent nausea and occasional vomiting.  This is associated with her complaints of diarrhea.  She states this is been ongoing for couple months, although I am not sure of how much she is actually vomiting given no ongoing weight loss.  At this point I will send in Zofran to her pharmacy to help with symptomatic management.  Further recommendations are diarrhea below.

## 2018-12-20 NOTE — Assessment & Plan Note (Signed)
The patient previously noted abdominal pain and diarrhea.  Her symptoms generally improved at her last visit and identified various triggers for diarrhea.  She is still avoiding dairy other than creamer in her coffee.  Denies artificial sweeteners.  Abdominal pain remains a day.  She seems a bit of a poor historian.  Thinks her diarrhea has been ongoing for the past 1 or 2 months.  I am not sure how accurate this is.  Her stools are quite variable anywhere from no stools to 7 stools a day with on average 8 out of 10 stools being loose.  At this point to cover all bases I will check a GI pathogen panel and C. difficile given recent antibiotic exposure.  If this is negative we can send in Bentyl to help with her diarrhea.  I will also recommend a probiotic.  Return for follow-up in 2 months.

## 2018-12-20 NOTE — Patient Instructions (Signed)
PA for EGD submitted via [employer] website. PA# [id number], valid 02/12/19-03/14/19.

## 2018-12-21 NOTE — Progress Notes (Signed)
CC'ED TO PCP 

## 2018-12-24 DIAGNOSIS — J449 Chronic obstructive pulmonary disease, unspecified: Secondary | ICD-10-CM | POA: Diagnosis not present

## 2018-12-26 ENCOUNTER — Other Ambulatory Visit (HOSPITAL_COMMUNITY)
Admission: AD | Admit: 2018-12-26 | Discharge: 2018-12-26 | Disposition: A | Discharge status: Home / Self Care | Payer: Medicare PPO | Source: Ambulatory Visit | Attending: Nurse Practitioner | Admitting: Nurse Practitioner

## 2018-12-26 DIAGNOSIS — R1310 Dysphagia, unspecified: Secondary | ICD-10-CM | POA: Diagnosis not present

## 2018-12-26 LAB — C DIFFICILE QUICK SCREEN W PCR REFLEX
C Diff antigen: NEGATIVE
C Diff interpretation: NOT DETECTED
C Diff toxin: NEGATIVE

## 2018-12-28 ENCOUNTER — Inpatient Hospital Stay (HOSPITAL_COMMUNITY): Payer: Medicare PPO | Attending: Hematology

## 2018-12-28 ENCOUNTER — Other Ambulatory Visit: Payer: Self-pay

## 2018-12-28 DIAGNOSIS — D509 Iron deficiency anemia, unspecified: Secondary | ICD-10-CM | POA: Diagnosis not present

## 2018-12-28 DIAGNOSIS — Z79899 Other long term (current) drug therapy: Secondary | ICD-10-CM | POA: Insufficient documentation

## 2018-12-28 DIAGNOSIS — K219 Gastro-esophageal reflux disease without esophagitis: Secondary | ICD-10-CM | POA: Diagnosis not present

## 2018-12-28 DIAGNOSIS — E079 Disorder of thyroid, unspecified: Secondary | ICD-10-CM | POA: Insufficient documentation

## 2018-12-28 DIAGNOSIS — E1122 Type 2 diabetes mellitus with diabetic chronic kidney disease: Secondary | ICD-10-CM | POA: Insufficient documentation

## 2018-12-28 DIAGNOSIS — M1990 Unspecified osteoarthritis, unspecified site: Secondary | ICD-10-CM | POA: Insufficient documentation

## 2018-12-28 DIAGNOSIS — N189 Chronic kidney disease, unspecified: Secondary | ICD-10-CM | POA: Diagnosis not present

## 2018-12-28 DIAGNOSIS — M810 Age-related osteoporosis without current pathological fracture: Secondary | ICD-10-CM | POA: Diagnosis not present

## 2018-12-28 DIAGNOSIS — J439 Emphysema, unspecified: Secondary | ICD-10-CM | POA: Insufficient documentation

## 2018-12-28 DIAGNOSIS — J449 Chronic obstructive pulmonary disease, unspecified: Secondary | ICD-10-CM | POA: Insufficient documentation

## 2018-12-28 DIAGNOSIS — F17210 Nicotine dependence, cigarettes, uncomplicated: Secondary | ICD-10-CM | POA: Diagnosis not present

## 2018-12-28 DIAGNOSIS — M549 Dorsalgia, unspecified: Secondary | ICD-10-CM | POA: Insufficient documentation

## 2018-12-28 DIAGNOSIS — M7989 Other specified soft tissue disorders: Secondary | ICD-10-CM | POA: Diagnosis not present

## 2018-12-28 DIAGNOSIS — G4730 Sleep apnea, unspecified: Secondary | ICD-10-CM | POA: Insufficient documentation

## 2018-12-28 DIAGNOSIS — F329 Major depressive disorder, single episode, unspecified: Secondary | ICD-10-CM | POA: Insufficient documentation

## 2018-12-28 DIAGNOSIS — I130 Hypertensive heart and chronic kidney disease with heart failure and stage 1 through stage 4 chronic kidney disease, or unspecified chronic kidney disease: Secondary | ICD-10-CM | POA: Diagnosis not present

## 2018-12-28 DIAGNOSIS — Z86010 Personal history of colonic polyps: Secondary | ICD-10-CM | POA: Insufficient documentation

## 2018-12-28 DIAGNOSIS — Z7982 Long term (current) use of aspirin: Secondary | ICD-10-CM | POA: Diagnosis not present

## 2018-12-28 DIAGNOSIS — R0602 Shortness of breath: Secondary | ICD-10-CM | POA: Diagnosis not present

## 2018-12-28 DIAGNOSIS — Z9981 Dependence on supplemental oxygen: Secondary | ICD-10-CM | POA: Diagnosis not present

## 2018-12-28 DIAGNOSIS — Z85828 Personal history of other malignant neoplasm of skin: Secondary | ICD-10-CM | POA: Insufficient documentation

## 2018-12-28 DIAGNOSIS — F419 Anxiety disorder, unspecified: Secondary | ICD-10-CM | POA: Insufficient documentation

## 2018-12-28 DIAGNOSIS — R5383 Other fatigue: Secondary | ICD-10-CM | POA: Diagnosis not present

## 2018-12-28 DIAGNOSIS — E785 Hyperlipidemia, unspecified: Secondary | ICD-10-CM | POA: Diagnosis not present

## 2018-12-28 DIAGNOSIS — I509 Heart failure, unspecified: Secondary | ICD-10-CM | POA: Insufficient documentation

## 2018-12-28 DIAGNOSIS — D508 Other iron deficiency anemias: Secondary | ICD-10-CM

## 2018-12-28 LAB — CBC WITH DIFFERENTIAL/PLATELET
Abs Immature Granulocytes: 0.06 10*3/uL (ref 0.00–0.07)
Basophils Absolute: 0.1 10*3/uL (ref 0.0–0.1)
Basophils Relative: 1 %
Eosinophils Absolute: 0.3 10*3/uL (ref 0.0–0.5)
Eosinophils Relative: 4 %
HCT: 36.5 % (ref 36.0–46.0)
Hemoglobin: 10.9 g/dL — ABNORMAL LOW (ref 12.0–15.0)
Immature Granulocytes: 1 %
Lymphocytes Relative: 25 %
Lymphs Abs: 1.9 10*3/uL (ref 0.7–4.0)
MCH: 27.6 pg (ref 26.0–34.0)
MCHC: 29.9 g/dL — ABNORMAL LOW (ref 30.0–36.0)
MCV: 92.4 fL (ref 80.0–100.0)
Monocytes Absolute: 0.7 10*3/uL (ref 0.1–1.0)
Monocytes Relative: 9 %
Neutro Abs: 4.6 10*3/uL (ref 1.7–7.7)
Neutrophils Relative %: 60 %
Platelets: 306 10*3/uL (ref 150–400)
RBC: 3.95 MIL/uL (ref 3.87–5.11)
RDW: 16 % — ABNORMAL HIGH (ref 11.5–15.5)
WBC: 7.7 10*3/uL (ref 4.0–10.5)
nRBC: 0 % (ref 0.0–0.2)

## 2018-12-28 LAB — COMPREHENSIVE METABOLIC PANEL
ALT: 13 U/L (ref 0–44)
AST: 21 U/L (ref 15–41)
Albumin: 3.8 g/dL (ref 3.5–5.0)
Alkaline Phosphatase: 56 U/L (ref 38–126)
Anion gap: 10 (ref 5–15)
BUN: 23 mg/dL (ref 8–23)
CO2: 23 mmol/L (ref 22–32)
Calcium: 8.6 mg/dL — ABNORMAL LOW (ref 8.9–10.3)
Chloride: 105 mmol/L (ref 98–111)
Creatinine, Ser: 1.07 mg/dL — ABNORMAL HIGH (ref 0.44–1.00)
GFR calc Af Amer: >60 mL/min (ref >60)
GFR calc non Af Amer: 54 mL/min — ABNORMAL LOW (ref >60)
Glucose, Bld: 119 mg/dL — ABNORMAL HIGH (ref 70–99)
Potassium: 3.7 mmol/L (ref 3.5–5.1)
Sodium: 138 mmol/L (ref 135–145)
Total Bilirubin: 0.3 mg/dL (ref 0.3–1.2)
Total Protein: 7 g/dL (ref 6.5–8.1)

## 2018-12-28 LAB — IRON AND TIBC
Iron: 28 ug/dL (ref 28–170)
Saturation Ratios: 6 % — ABNORMAL LOW (ref 10.4–31.8)
TIBC: 461 ug/dL — ABNORMAL HIGH (ref 250–450)
UIBC: 433 ug/dL

## 2018-12-28 LAB — FOLATE: Folate: 7.8 ng/mL (ref >5.9)

## 2018-12-28 LAB — LACTATE DEHYDROGENASE: LDH: 142 U/L (ref 98–192)

## 2018-12-28 LAB — VITAMIN B12: Vitamin B-12: 378 pg/mL (ref 180–914)

## 2018-12-28 LAB — FERRITIN: Ferritin: 9 ng/mL — ABNORMAL LOW (ref 11–307)

## 2018-12-29 ENCOUNTER — Other Ambulatory Visit: Payer: Self-pay | Admitting: Family Medicine

## 2019-01-03 ENCOUNTER — Other Ambulatory Visit: Payer: Self-pay | Admitting: Family Medicine

## 2019-01-03 ENCOUNTER — Other Ambulatory Visit: Payer: Self-pay

## 2019-01-04 ENCOUNTER — Inpatient Hospital Stay (HOSPITAL_BASED_OUTPATIENT_CLINIC_OR_DEPARTMENT_OTHER): Payer: Medicare PPO | Admitting: Hematology

## 2019-01-04 ENCOUNTER — Encounter (HOSPITAL_COMMUNITY): Payer: Self-pay | Admitting: Hematology

## 2019-01-04 DIAGNOSIS — K219 Gastro-esophageal reflux disease without esophagitis: Secondary | ICD-10-CM

## 2019-01-04 DIAGNOSIS — D508 Other iron deficiency anemias: Secondary | ICD-10-CM

## 2019-01-04 DIAGNOSIS — I130 Hypertensive heart and chronic kidney disease with heart failure and stage 1 through stage 4 chronic kidney disease, or unspecified chronic kidney disease: Secondary | ICD-10-CM | POA: Diagnosis not present

## 2019-01-04 DIAGNOSIS — M1990 Unspecified osteoarthritis, unspecified site: Secondary | ICD-10-CM

## 2019-01-04 DIAGNOSIS — N189 Chronic kidney disease, unspecified: Secondary | ICD-10-CM

## 2019-01-04 DIAGNOSIS — I509 Heart failure, unspecified: Secondary | ICD-10-CM | POA: Diagnosis not present

## 2019-01-04 DIAGNOSIS — J301 Allergic rhinitis due to pollen: Secondary | ICD-10-CM | POA: Diagnosis not present

## 2019-01-04 DIAGNOSIS — F17210 Nicotine dependence, cigarettes, uncomplicated: Secondary | ICD-10-CM

## 2019-01-04 DIAGNOSIS — E1122 Type 2 diabetes mellitus with diabetic chronic kidney disease: Secondary | ICD-10-CM

## 2019-01-04 DIAGNOSIS — D509 Iron deficiency anemia, unspecified: Secondary | ICD-10-CM

## 2019-01-04 DIAGNOSIS — M549 Dorsalgia, unspecified: Secondary | ICD-10-CM | POA: Diagnosis not present

## 2019-01-04 DIAGNOSIS — F329 Major depressive disorder, single episode, unspecified: Secondary | ICD-10-CM

## 2019-01-04 DIAGNOSIS — R5383 Other fatigue: Secondary | ICD-10-CM

## 2019-01-04 DIAGNOSIS — R0602 Shortness of breath: Secondary | ICD-10-CM | POA: Diagnosis not present

## 2019-01-04 DIAGNOSIS — Z85828 Personal history of other malignant neoplasm of skin: Secondary | ICD-10-CM

## 2019-01-04 DIAGNOSIS — J439 Emphysema, unspecified: Secondary | ICD-10-CM

## 2019-01-04 DIAGNOSIS — Z7982 Long term (current) use of aspirin: Secondary | ICD-10-CM

## 2019-01-04 DIAGNOSIS — J449 Chronic obstructive pulmonary disease, unspecified: Secondary | ICD-10-CM

## 2019-01-04 DIAGNOSIS — E785 Hyperlipidemia, unspecified: Secondary | ICD-10-CM

## 2019-01-04 DIAGNOSIS — Z9981 Dependence on supplemental oxygen: Secondary | ICD-10-CM

## 2019-01-04 DIAGNOSIS — Z86010 Personal history of colonic polyps: Secondary | ICD-10-CM

## 2019-01-04 DIAGNOSIS — M7989 Other specified soft tissue disorders: Secondary | ICD-10-CM

## 2019-01-04 DIAGNOSIS — F419 Anxiety disorder, unspecified: Secondary | ICD-10-CM | POA: Diagnosis not present

## 2019-01-04 DIAGNOSIS — E079 Disorder of thyroid, unspecified: Secondary | ICD-10-CM

## 2019-01-04 DIAGNOSIS — M810 Age-related osteoporosis without current pathological fracture: Secondary | ICD-10-CM

## 2019-01-04 DIAGNOSIS — Z79899 Other long term (current) drug therapy: Secondary | ICD-10-CM

## 2019-01-04 DIAGNOSIS — I10 Essential (primary) hypertension: Secondary | ICD-10-CM | POA: Diagnosis not present

## 2019-01-04 DIAGNOSIS — G4730 Sleep apnea, unspecified: Secondary | ICD-10-CM

## 2019-01-04 NOTE — Patient Instructions (Signed)
[place] at [facility] Discharge Instructions  You were seen today by Dr. Delton Coombes. He went over your recent lab results.He would like you to have 2 IV iron infusions. He will see you back in 4 months for labs and follow up.   Thank you for choosing [place] at [facility] to provide your oncology and hematology care.  To afford each patient quality time with our provider, please arrive at least 15 minutes before your scheduled appointment time.   If you have a lab appointment with the [facility] please come in thru the  Main Entrance and check in at the main information desk  You need to re-schedule your appointment should you arrive 10 or more minutes late.  We strive to give you quality time with our providers, and arriving late affects you and other patients whose appointments are after yours.  Also, if you no show three or more times for appointments you may be dismissed from the clinic at the providers discretion.     Again, thank you for choosing [facility].  Our hope is that these requests will decrease the amount of time that you wait before being seen by our physicians.       _____________________________________________________________  Should you have questions after your visit to [facility], please contact our office at [phone] between the hours of 8:00 a.m. and 4:30 p.m.  Voicemails left after 4:00 p.m. will not be returned until the following business day.  For prescription refill requests, have your pharmacy contact our office and allow 72 hours.    Cancer Center Support Programs:   > Cancer Support Group  2nd Tuesday of the month 1pm-2pm, Journey Room

## 2019-01-04 NOTE — Assessment & Plan Note (Addendum)
1.  Normocytic anemia: -Etiology is iron deficiency and mild degree of CKD. - Last blood transfusion in February 2019, 2 units. - Colonoscopy on 01/25/2011, very tortuous colon with a 0.5 cm polyp in the transverse colon. - Feraheme infusion on 06/30/2018 and 07/10/2018. - We reviewed her blood work.  Hemoglobin is 10.9.  Ferritin is down to 9.  Percent saturation is 6.  S34 and folic acid are normal. - She feels extremely tired.  She reports that improved energy levels last for about couple of months.  I have recommended 2 more infusions of Feraheme. -We will see her back in 4 months with repeat blood counts.  2.  Smoking history: - CT chest low-dose scan on 08/07/2018 was lung RADS 2. - We will repeat scan in 12 months.

## 2019-01-04 NOTE — Progress Notes (Signed)
[address], [place] [address]   CLINIC:  Medical Oncology/Hematology  PCP:  Dettinger, Fransisca Kaufmann, MD [place] [address] [phone]   REASON FOR VISIT: Follow-up for iron deficiency anemia.   CURRENT THERAPY: intermittent iron infusions   INTERVAL HISTORY:  Ms. Shartzer 68 y.o. female returns for follow-up of iron deficiency anemia.  She denies any bleeding per rectum or melena.  She has delayed her follow-up visit 3 months ago secondary to COVID-19.  She cannot walk long distances and needs wheelchair.  She is on continuous home oxygen.  She is accompanied by her husband today.  Reports no appetite and no energy.  Pain in the back is stable and present.  She does report some trouble swallowing.  Leg swellings have been chronic and stable.  Denies any ER visits or hospitalizations.   REVIEW OF SYSTEMS:  Review of Systems  Constitutional: Positive for fatigue.  Respiratory: Positive for shortness of breath.   Cardiovascular: Positive for leg swelling.  Musculoskeletal: Negative for back pain.  Neurological: Positive for numbness.  All other systems reviewed and are negative.    PAST MEDICAL/SURGICAL HISTORY:  Past Medical History:  Diagnosis Date  . Allergy   . Anemia   . Anxiety   . Arthritis   . Asthma   . Blood transfusion without reported diagnosis   . Cataract   . CHF (congestive heart failure) ([place])   . Chronic kidney disease   . Clotting disorder ([place])   . COPD (chronic obstructive pulmonary disease) ([place])   . Depression   . Diabetes mellitus without complication ([place])    pt states she is not diabetic   . Emphysema of lung ([place])   . GERD (gastroesophageal reflux disease)   . Hyperlipidemia   . Hypertension   . Neuromuscular disorder ([place])   . Osteoporosis   . Seizures ([place])   . Skin cancer    2 areas removed more than 10 years ago  . Sleep apnea 1985   wears cpap nightly  . Thyroid disease     Past Surgical History:  Procedure Laterality Date  . APPENDECTOMY    . BIOPSY  03/16/2018   Procedure: BIOPSY;  Surgeon: Daneil Dolin, MD;  Location: [facility];  Service: Endoscopy;;  gastric bx's  . CAROTID ENDARTERECTOMY Bilateral   . CERVICAL SPINE SURGERY     after neck fracture  . ESOPHAGOGASTRODUODENOSCOPY (EGD) WITH PROPOFOL N/A 03/16/2018   Procedure: ESOPHAGOGASTRODUODENOSCOPY (EGD) WITH PROPOFOL;  Surgeon: Daneil Dolin, MD;  Location: [facility];  Service: Endoscopy;  Laterality: N/A;  3:00pm  . EYE SURGERY    . FRACTURE SURGERY    . heart stents    . JOINT REPLACEMENT     Right knee  . SPINAL CORD STIMULATOR IMPLANT  2015  . SPINE SURGERY     lumbar x4      SOCIAL HISTORY:  Social History   Socioeconomic History  . Marital status: Married    Spouse name: Not on file  . Number of children: 2  . Years of education: 18  . Highest education level: Some college, no degree  Occupational History  . Occupation: Retired  Scientific laboratory technician  . Financial resource strain: Not hard at all  . Food insecurity    Worry: Never true    Inability: Never true  . Transportation needs    Medical: No    Non-medical: No  Tobacco Use  .  Smoking status: Current Every Day Smoker    Packs/day: 1.00    Years: 43.00    Pack years: 43.00    Types: Cigarettes  . Smokeless tobacco: Never Used  Substance and Sexual Activity  . Alcohol use: Yes    Frequency: Never    Comment: occasionally, not even 1 drink per month   . Drug use: No  . Sexual activity: Not Currently    Birth control/protection: Post-menopausal  Lifestyle  . Physical activity    Days per week: 0 days    Minutes per session: 0 min  . Stress: To some extent  Relationships  . Social connections    Talks on phone: More than three times a week    Gets together: Three times a week    Attends religious service: Never    Active member of club or organization: No    Attends meetings of clubs or organizations: Never     Relationship status: Married  . Intimate partner violence    Fear of current or ex partner: No    Emotionally abused: No    Physically abused: No    Forced sexual activity: No  Other Topics Concern  . Not on file  Social History Narrative   Lives at home with husband and son   Right hand dominant   Caffeine: 4-5 cups daily (coffee & mtn dew)    FAMILY HISTORY:  Family History  Adopted: Yes  Problem Relation Age of Onset  . Arthritis Mother   . Asthma Mother   . Cancer Mother        lung  . Depression Mother   . Diabetes Mother   . Arthritis Father   . Asthma Father   . Depression Father   . Asthma Sister   . Depression Sister   . Diabetes Sister   . Arthritis Maternal Grandmother   . Asthma Maternal Grandmother   . Depression Maternal Grandmother   . Arthritis Maternal Grandfather   . Asthma Maternal Grandfather   . Arthritis Paternal Grandmother   . Asthma Paternal Grandmother   . Arthritis Paternal Grandfather   . Asthma Paternal Grandfather   . COPD Paternal Grandfather   . Diabetes Sister   . Mental illness Sister   . Anemia Sister   . Hypertension Son   . Post-traumatic stress disorder Son   . Allergies Daughter   . Breast cancer Neg Hx   . Seizures Neg Hx        not that she knows of, pt adopted    CURRENT MEDICATIONS:  Outpatient Encounter Medications as of 01/04/2019  Medication Sig Note  . albuterol (PROVENTIL HFA) 108 (90 Base) MCG/ACT inhaler Inhale into the lungs.   Marland Kitchen alendronate (FOSAMAX) 70 MG tablet Take 1 tablet (70 mg total) by mouth every Monday. Take with a full glass of water on an empty stomach.   Marland Kitchen aspirin EC 81 MG tablet Take 81 mg by mouth daily.   . buprenorphine (BUTRANS) 10 MCG/HR PTWK patch Place 10 mcg onto the skin once a week.   . butalbital-acetaminophen-caffeine (FIORICET, ESGIC) 50-325-40 MG tablet Take by mouth 2 (two) times daily as needed for headache or migraine.   . Cholecalciferol (VITAMIN D-3) 5000 units TABS Take  5,000 Units by mouth daily.   . Cyanocobalamin (VITAMIN B-12 PO) Take by mouth daily.   . cyanocobalamin 100 MCG tablet Take 100 mcg by mouth.   . cyclobenzaprine (FLEXERIL) 10 MG tablet TAKE 1 TABLET BY MOUTH  TWICE DAILY AS NEEDED FOR MUSCLE SPASM   . dicyclomine (BENTYL) 10 MG capsule Take 1 capsule (10 mg total) by mouth 4 (four) times daily as needed (for diarrhea).   . diphenhydramine-acetaminophen (TYLENOL PM EXTRA STRENGTH) 25-500 MG TABS tablet Take 1 tablet by mouth at bedtime as needed (pain).    . DULoxetine (CYMBALTA) 30 MG capsule TAKE 3 CAPSULES BY MOUTH ONCE DAILY   . EPINEPHrine 0.3 mg/0.3 mL IJ SOAJ injection Inject 0.3 mg into the muscle as needed. 03/28/2018: Has an epipen. Is allergic to bees.  Noelle Penner ALLERGY RELIEF, CETIRIZINE, 10 MG tablet Take 1 tablet by mouth once daily   . Ferrous Sulfate (IRON) 325 (65 Fe) MG TABS Take 1 tablet (325 mg total) by mouth daily.   . fluticasone (FLONASE) 50 MCG/ACT nasal spray Place 2 sprays into both nostrils daily.   . Fluticasone-Umeclidin-Vilant (TRELEGY ELLIPTA) 100-62.5-25 MCG/INH AEPB Inhale into the lungs 1 day or 1 dose.   . folic acid (FOLVITE) 1 MG tablet Take 1 mg by mouth daily.   Marland Kitchen gabapentin (NEURONTIN) 600 MG tablet Take 1 tablet (600 mg total) by mouth 4 (four) times daily.   Marland Kitchen ibuprofen (ADVIL,MOTRIN) 200 MG tablet Take 200 mg by mouth every 6 (six) hours as needed.   Marland Kitchen ipratropium-albuterol (DUONEB) 0.5-2.5 (3) MG/3ML SOLN Take 3 mLs by nebulization every 6 (six) hours as needed (shortness of breathe).    . levETIRAcetam (KEPPRA) 1000 MG tablet TAKE 1 TABLET BY MOUTH AT BEDTIME   . levETIRAcetam (KEPPRA) 750 MG tablet TAKE 1 TABLET BY MOUTH IN THE MORNING   . levothyroxine (SYNTHROID, LEVOTHROID) 50 MCG tablet Take 1 tablet (50 mcg total) by mouth daily before breakfast.   . Magnesium 400 MG CAPS Take 400 mg by mouth daily.   . Melatonin 10 MG TABS Take by mouth.   . metoprolol tartrate (LOPRESSOR) 25 MG tablet Take 12.5 mg  by mouth 2 (two) times daily.   . Multiple Vitamin (MULTIVITAMIN) tablet Take 1 tablet by mouth daily.   . naloxone (NARCAN) nasal spray 4 mg/0.1 mL Place into the nose. 03/28/2018: Has but has never needed to use it.  . Omega-3 Fatty Acids (FISH OIL) 1000 MG CAPS Take 1,000 mg by mouth daily.   . ondansetron (ZOFRAN) 4 MG tablet TAKE 1 TABLET BY MOUTH 4 TIMES DAILY AS NEEDED   . potassium chloride (K-DUR) 10 MEQ tablet Take 1 tablet by mouth daily.   . rosuvastatin (CRESTOR) 40 MG tablet Take 1 tablet (40 mg total) by mouth daily.    No facility-administered encounter medications on file as of 01/04/2019.     ALLERGIES:  Allergies  Allergen Reactions  . Cephalexin Hives  . Ketorolac Tromethamine Hives  . Lac Bovis Nausea And Vomiting  . Imdur  [Isosorbide Dinitrate]   . Iodinated Diagnostic Agents   . Other Other (See Comments)    Alka seltzer causes blurred vision, fainting  . Sodium Bicarbonate-Citric Acid   . Milk-Related Compounds Nausea And Vomiting     PHYSICAL EXAM:  ECOG Performance status: 1  Vitals:   01/04/19 1208  BP: 111/90  Pulse: 75  Resp: 18  Temp: (!) 97.1 F (36.2 C)  SpO2: 100%   There were no vitals filed for this visit.  Physical Exam Constitutional:      Appearance: She is well-developed.  Cardiovascular:     Rate and Rhythm: Normal rate and regular rhythm.     Heart sounds: Normal heart sounds.  Pulmonary:     Effort: Pulmonary effort is normal.     Breath sounds: Wheezing present.  Abdominal:     General: There is no distension.     Palpations: Abdomen is soft. There is no mass.  Musculoskeletal:     Comments: wheelchair  Skin:    General: Skin is warm and dry.  Neurological:     Mental Status: She is alert and oriented to person, place, and time.  Psychiatric:        Mood and Affect: Mood normal.        Behavior: Behavior normal.      LABORATORY DATA:  I have reviewed the labs as listed.  CBC    Component Value Date/Time    WBC 7.7 12/28/2018 1338   RBC 3.95 12/28/2018 1338   HGB 10.9 (L) 12/28/2018 1338   HGB 11.0 (L) 07/26/2018 0929   HCT 36.5 12/28/2018 1338   HCT 33.7 (L) 07/26/2018 0929   PLT 306 12/28/2018 1338   PLT 308 07/26/2018 0929   MCV 92.4 12/28/2018 1338   MCV 91 07/26/2018 0929   MCH 27.6 12/28/2018 1338   MCHC 29.9 (L) 12/28/2018 1338   RDW 16.0 (H) 12/28/2018 1338   RDW 15.9 (H) 07/26/2018 0929   LYMPHSABS 1.9 12/28/2018 1338   LYMPHSABS 1.5 07/26/2018 0929   MONOABS 0.7 12/28/2018 1338   EOSABS 0.3 12/28/2018 1338   EOSABS 0.3 07/26/2018 0929   BASOSABS 0.1 12/28/2018 1338   BASOSABS 0.1 07/26/2018 0929   CMP Latest Ref Rng & Units 12/28/2018 07/26/2018 06/14/2018  Glucose 70 - 99 mg/dL 119(H) 89 125(H)  BUN 8 - 23 mg/dL 23 23 24(H)  Creatinine 0.44 - 1.00 mg/dL 1.07(H) 0.95 0.88  Sodium 135 - 145 mmol/L 138 134 135  Potassium 3.5 - 5.1 mmol/L 3.7 5.3(H) 4.8  Chloride 98 - 111 mmol/L 105 102 104  CO2 22 - 32 mmol/L 23 21 25   Calcium 8.9 - 10.3 mg/dL 8.6(L) 9.7 9.6  Total Protein 6.5 - 8.1 g/dL 7.0 6.5 7.3  Total Bilirubin 0.3 - 1.2 mg/dL 0.3 <0.2 0.1(L)  Alkaline Phos 38 - 126 U/L 56 70 64  AST 15 - 41 U/L 21 22 19   ALT 0 - 44 U/L 13 19 16     Radiology: -I have personally reviewed the CT scan images and discussed with patient.    ASSESSMENT & PLAN:   Iron deficiency anemia 1.  Normocytic anemia: -Etiology is iron deficiency and mild degree of CKD. - Last blood transfusion in February 2019, 2 units. - Colonoscopy on 01/25/2011, very tortuous colon with a 0.5 cm polyp in the transverse colon. - Feraheme infusion on 06/30/2018 and 07/10/2018. - We reviewed her blood work.  Hemoglobin is 10.9.  Ferritin is down to 9.  Percent saturation is 6.  P38 and folic acid are normal. - She feels extremely tired.  She reports that improved energy levels last for about couple of months.  I have recommended 2 more infusions of Feraheme. -We will see her back in 4 months with repeat  blood counts.  2.  Smoking history: - CT chest low-dose scan on 08/07/2018 was lung RADS 2. - We will repeat scan in 12 months.   Total time spent is 25 minutes with more than 50% of the time spent face-to-face discussing treatment plan and counseling and coordination of care.  Orders placed this encounter:  Orders Placed This Encounter  Procedures  . CBC with Differential/Platelet  . Comprehensive metabolic  panel  . Iron and TIBC  . Ferritin      Derek Jack, MD [place] [phone]

## 2019-01-05 ENCOUNTER — Other Ambulatory Visit: Payer: Self-pay | Admitting: Family Medicine

## 2019-01-08 ENCOUNTER — Ambulatory Visit (HOSPITAL_COMMUNITY): Payer: Medicare PPO

## 2019-01-10 ENCOUNTER — Other Ambulatory Visit: Payer: Self-pay

## 2019-01-10 ENCOUNTER — Inpatient Hospital Stay (HOSPITAL_COMMUNITY): Payer: Medicare PPO | Attending: Hematology

## 2019-01-10 ENCOUNTER — Encounter (HOSPITAL_COMMUNITY): Payer: Self-pay

## 2019-01-10 VITALS — BP 135/88 | HR 74 | Temp 97.5°F | Resp 18

## 2019-01-10 DIAGNOSIS — G4730 Sleep apnea, unspecified: Secondary | ICD-10-CM | POA: Diagnosis not present

## 2019-01-10 DIAGNOSIS — N189 Chronic kidney disease, unspecified: Secondary | ICD-10-CM | POA: Diagnosis not present

## 2019-01-10 DIAGNOSIS — K219 Gastro-esophageal reflux disease without esophagitis: Secondary | ICD-10-CM | POA: Insufficient documentation

## 2019-01-10 DIAGNOSIS — R51 Headache: Secondary | ICD-10-CM | POA: Insufficient documentation

## 2019-01-10 DIAGNOSIS — F17210 Nicotine dependence, cigarettes, uncomplicated: Secondary | ICD-10-CM | POA: Diagnosis not present

## 2019-01-10 DIAGNOSIS — D509 Iron deficiency anemia, unspecified: Secondary | ICD-10-CM | POA: Insufficient documentation

## 2019-01-10 DIAGNOSIS — D508 Other iron deficiency anemias: Secondary | ICD-10-CM

## 2019-01-10 DIAGNOSIS — E785 Hyperlipidemia, unspecified: Secondary | ICD-10-CM | POA: Insufficient documentation

## 2019-01-10 DIAGNOSIS — E079 Disorder of thyroid, unspecified: Secondary | ICD-10-CM | POA: Insufficient documentation

## 2019-01-10 DIAGNOSIS — F329 Major depressive disorder, single episode, unspecified: Secondary | ICD-10-CM | POA: Diagnosis not present

## 2019-01-10 DIAGNOSIS — R0602 Shortness of breath: Secondary | ICD-10-CM | POA: Diagnosis not present

## 2019-01-10 DIAGNOSIS — Z85828 Personal history of other malignant neoplasm of skin: Secondary | ICD-10-CM | POA: Insufficient documentation

## 2019-01-10 DIAGNOSIS — I129 Hypertensive chronic kidney disease with stage 1 through stage 4 chronic kidney disease, or unspecified chronic kidney disease: Secondary | ICD-10-CM | POA: Diagnosis not present

## 2019-01-10 DIAGNOSIS — J449 Chronic obstructive pulmonary disease, unspecified: Secondary | ICD-10-CM | POA: Insufficient documentation

## 2019-01-10 DIAGNOSIS — R5383 Other fatigue: Secondary | ICD-10-CM | POA: Insufficient documentation

## 2019-01-10 DIAGNOSIS — E119 Type 2 diabetes mellitus without complications: Secondary | ICD-10-CM | POA: Insufficient documentation

## 2019-01-10 DIAGNOSIS — Z86010 Personal history of colonic polyps: Secondary | ICD-10-CM | POA: Diagnosis not present

## 2019-01-10 DIAGNOSIS — Z7982 Long term (current) use of aspirin: Secondary | ICD-10-CM | POA: Diagnosis not present

## 2019-01-10 DIAGNOSIS — M810 Age-related osteoporosis without current pathological fracture: Secondary | ICD-10-CM | POA: Diagnosis not present

## 2019-01-10 DIAGNOSIS — I509 Heart failure, unspecified: Secondary | ICD-10-CM | POA: Diagnosis not present

## 2019-01-10 DIAGNOSIS — Z79899 Other long term (current) drug therapy: Secondary | ICD-10-CM | POA: Diagnosis not present

## 2019-01-10 DIAGNOSIS — R531 Weakness: Secondary | ICD-10-CM | POA: Diagnosis not present

## 2019-01-10 MED ORDER — SODIUM CHLORIDE 0.9 % IV SOLN
Freq: Once | INTRAVENOUS | Status: AC
  Administered 2019-01-10: 15:00:00 via INTRAVENOUS

## 2019-01-10 MED ORDER — SODIUM CHLORIDE 0.9 % IV SOLN
510.0000 mg | Freq: Once | INTRAVENOUS | Status: AC
  Administered 2019-01-10: 510 mg via INTRAVENOUS
  Filled 2019-01-10: qty 510

## 2019-01-10 NOTE — Progress Notes (Signed)
Sharlot Gowda Stjulien tolerated Feraheme infusion well without complaints or incident. VSS upon discharge. Pt discharged via wheelchair in satisfactory condition accompanied by family member

## 2019-01-10 NOTE — Patient Instructions (Signed)
[facility] at [facility] Discharge Instructions  Received Feraheme infusion today. Follow-up as scheduled. Call clinic for any questions or concerns   Thank you for choosing [facility] at [facility] to provide your oncology and hematology care.  To afford each patient quality time with our provider, please arrive at least 15 minutes before your scheduled appointment time.   If you have a lab appointment with the Cancer Center please come in thru the  Main Entrance and check in at the main information desk  You need to re-schedule your appointment should you arrive 10 or more minutes late.  We strive to give you quality time with our providers, and arriving late affects you and other patients whose appointments are after yours.  Also, if you no show three or more times for appointments you may be dismissed from the clinic at the providers discretion.     Again, thank you for choosing [facility].  Our hope is that these requests will decrease the amount of time that you wait before being seen by our physicians.       _____________________________________________________________  Should you have questions after your visit to [facility], please contact our office at [phone] between the hours of 8:00 a.m. and 4:30 p.m.  Voicemails left after 4:00 p.m. will not be returned until the following business day.  For prescription refill requests, have your pharmacy contact our office and allow 72 hours.    Cancer Center Support Programs:   > Cancer Support Group  2nd Tuesday of the month 1pm-2pm, Journey Room   

## 2019-01-16 ENCOUNTER — Ambulatory Visit (HOSPITAL_COMMUNITY): Payer: Medicare PPO

## 2019-01-17 ENCOUNTER — Other Ambulatory Visit: Payer: Self-pay

## 2019-01-17 ENCOUNTER — Encounter (HOSPITAL_COMMUNITY): Payer: Self-pay

## 2019-01-17 ENCOUNTER — Inpatient Hospital Stay (HOSPITAL_COMMUNITY): Payer: Medicare PPO

## 2019-01-17 VITALS — BP 136/53 | HR 66 | Temp 96.7°F | Resp 18

## 2019-01-17 DIAGNOSIS — R0602 Shortness of breath: Secondary | ICD-10-CM | POA: Diagnosis not present

## 2019-01-17 DIAGNOSIS — R531 Weakness: Secondary | ICD-10-CM | POA: Diagnosis not present

## 2019-01-17 DIAGNOSIS — Z79899 Other long term (current) drug therapy: Secondary | ICD-10-CM | POA: Diagnosis not present

## 2019-01-17 DIAGNOSIS — R51 Headache: Secondary | ICD-10-CM | POA: Diagnosis not present

## 2019-01-17 DIAGNOSIS — D509 Iron deficiency anemia, unspecified: Secondary | ICD-10-CM | POA: Diagnosis not present

## 2019-01-17 DIAGNOSIS — N189 Chronic kidney disease, unspecified: Secondary | ICD-10-CM | POA: Diagnosis not present

## 2019-01-17 DIAGNOSIS — I129 Hypertensive chronic kidney disease with stage 1 through stage 4 chronic kidney disease, or unspecified chronic kidney disease: Secondary | ICD-10-CM | POA: Diagnosis not present

## 2019-01-17 DIAGNOSIS — D508 Other iron deficiency anemias: Secondary | ICD-10-CM

## 2019-01-17 DIAGNOSIS — R5383 Other fatigue: Secondary | ICD-10-CM | POA: Diagnosis not present

## 2019-01-17 DIAGNOSIS — I509 Heart failure, unspecified: Secondary | ICD-10-CM | POA: Diagnosis not present

## 2019-01-17 MED ORDER — SODIUM CHLORIDE 0.9 % IV SOLN
Freq: Once | INTRAVENOUS | Status: AC
  Administered 2019-01-17: 15:00:00 via INTRAVENOUS

## 2019-01-17 MED ORDER — SODIUM CHLORIDE 0.9 % IV SOLN
510.0000 mg | Freq: Once | INTRAVENOUS | Status: AC
  Administered 2019-01-17: 510 mg via INTRAVENOUS
  Filled 2019-01-17: qty 510

## 2019-01-17 NOTE — Progress Notes (Signed)
Treatment given today per MD orders. Tolerated infusion without adverse affects. Vital signs stable. No complaints at this time. Discharged from clinic ambulatory. F/U with [facility] as scheduled.   

## 2019-01-17 NOTE — Patient Instructions (Signed)
[facility] at [facility]  Discharge Instructions:   _______________________________________________________________  Thank you for choosing [facility] at [facility] to provide your oncology and hematology care.  To afford each patient quality time with our providers, please arrive at least 15 minutes before your scheduled appointment.  You need to re-schedule your appointment if you arrive 10 or more minutes late.  We strive to give you quality time with our providers, and arriving late affects you and other patients whose appointments are after yours.  Also, if you no show three or more times for appointments you may be dismissed from the clinic.  Again, thank you for choosing [facility] at [facility]. Our hope is that these requests will allow you access to exceptional care and in a timely manner. _______________________________________________________________  If you have questions after your visit, please contact our office at [phone] between the hours of 8:30 a.m. and 5:00 p.m. Voicemails left after 4:30 p.m. will not be returned until the following business day. _______________________________________________________________  For prescription refill requests, have your pharmacy contact our office. _______________________________________________________________  Recommendations made by the consultant and any test results will be sent to your referring physician. _______________________________________________________________ 

## 2019-01-23 ENCOUNTER — Other Ambulatory Visit: Payer: Self-pay

## 2019-01-23 ENCOUNTER — Inpatient Hospital Stay (HOSPITAL_COMMUNITY): Payer: Medicare PPO | Admitting: Hematology

## 2019-01-23 ENCOUNTER — Inpatient Hospital Stay (HOSPITAL_COMMUNITY): Payer: Medicare PPO

## 2019-01-23 DIAGNOSIS — M810 Age-related osteoporosis without current pathological fracture: Secondary | ICD-10-CM

## 2019-01-23 DIAGNOSIS — I129 Hypertensive chronic kidney disease with stage 1 through stage 4 chronic kidney disease, or unspecified chronic kidney disease: Secondary | ICD-10-CM

## 2019-01-23 DIAGNOSIS — D509 Iron deficiency anemia, unspecified: Secondary | ICD-10-CM

## 2019-01-23 DIAGNOSIS — F329 Major depressive disorder, single episode, unspecified: Secondary | ICD-10-CM

## 2019-01-23 DIAGNOSIS — I509 Heart failure, unspecified: Secondary | ICD-10-CM | POA: Diagnosis not present

## 2019-01-23 DIAGNOSIS — R0602 Shortness of breath: Secondary | ICD-10-CM

## 2019-01-23 DIAGNOSIS — R531 Weakness: Secondary | ICD-10-CM

## 2019-01-23 DIAGNOSIS — E119 Type 2 diabetes mellitus without complications: Secondary | ICD-10-CM

## 2019-01-23 DIAGNOSIS — R5383 Other fatigue: Secondary | ICD-10-CM | POA: Diagnosis not present

## 2019-01-23 DIAGNOSIS — Z85828 Personal history of other malignant neoplasm of skin: Secondary | ICD-10-CM

## 2019-01-23 DIAGNOSIS — N189 Chronic kidney disease, unspecified: Secondary | ICD-10-CM

## 2019-01-23 DIAGNOSIS — K219 Gastro-esophageal reflux disease without esophagitis: Secondary | ICD-10-CM

## 2019-01-23 DIAGNOSIS — J449 Chronic obstructive pulmonary disease, unspecified: Secondary | ICD-10-CM

## 2019-01-23 DIAGNOSIS — E079 Disorder of thyroid, unspecified: Secondary | ICD-10-CM

## 2019-01-23 DIAGNOSIS — R51 Headache: Secondary | ICD-10-CM

## 2019-01-23 DIAGNOSIS — Z79899 Other long term (current) drug therapy: Secondary | ICD-10-CM

## 2019-01-23 DIAGNOSIS — F17210 Nicotine dependence, cigarettes, uncomplicated: Secondary | ICD-10-CM

## 2019-01-23 DIAGNOSIS — G4730 Sleep apnea, unspecified: Secondary | ICD-10-CM

## 2019-01-23 DIAGNOSIS — E785 Hyperlipidemia, unspecified: Secondary | ICD-10-CM

## 2019-01-23 DIAGNOSIS — Z86010 Personal history of colonic polyps: Secondary | ICD-10-CM

## 2019-01-23 DIAGNOSIS — Z7982 Long term (current) use of aspirin: Secondary | ICD-10-CM

## 2019-01-23 LAB — CBC WITH DIFFERENTIAL/PLATELET
Abs Immature Granulocytes: 0.08 10*3/uL — ABNORMAL HIGH (ref 0.00–0.07)
Basophils Absolute: 0.1 10*3/uL (ref 0.0–0.1)
Basophils Relative: 1 %
Eosinophils Absolute: 0.4 10*3/uL (ref 0.0–0.5)
Eosinophils Relative: 4 %
HCT: 41.6 % (ref 36.0–46.0)
Hemoglobin: 12.5 g/dL (ref 12.0–15.0)
Immature Granulocytes: 1 %
Lymphocytes Relative: 20 %
Lymphs Abs: 1.9 10*3/uL (ref 0.7–4.0)
MCH: 27.9 pg (ref 26.0–34.0)
MCHC: 30 g/dL (ref 30.0–36.0)
MCV: 92.9 fL (ref 80.0–100.0)
Monocytes Absolute: 0.9 10*3/uL (ref 0.1–1.0)
Monocytes Relative: 9 %
Neutro Abs: 6 10*3/uL (ref 1.7–7.7)
Neutrophils Relative %: 65 %
Platelets: 308 10*3/uL (ref 150–400)
RBC: 4.48 MIL/uL (ref 3.87–5.11)
RDW: 16.8 % — ABNORMAL HIGH (ref 11.5–15.5)
WBC: 9.2 10*3/uL (ref 4.0–10.5)
nRBC: 0 % (ref 0.0–0.2)

## 2019-01-23 LAB — COMPREHENSIVE METABOLIC PANEL
ALT: 14 U/L (ref 0–44)
AST: 21 U/L (ref 15–41)
Albumin: 3.9 g/dL (ref 3.5–5.0)
Alkaline Phosphatase: 58 U/L (ref 38–126)
Anion gap: 9 (ref 5–15)
BUN: 15 mg/dL (ref 8–23)
CO2: 25 mmol/L (ref 22–32)
Calcium: 9.8 mg/dL (ref 8.9–10.3)
Chloride: 104 mmol/L (ref 98–111)
Creatinine, Ser: 1.02 mg/dL — ABNORMAL HIGH (ref 0.44–1.00)
GFR calc Af Amer: >60 mL/min (ref >60)
GFR calc non Af Amer: 57 mL/min — ABNORMAL LOW (ref >60)
Glucose, Bld: 92 mg/dL (ref 70–99)
Potassium: 3.7 mmol/L (ref 3.5–5.1)
Sodium: 138 mmol/L (ref 135–145)
Total Bilirubin: 0.2 mg/dL — ABNORMAL LOW (ref 0.3–1.2)
Total Protein: 7.2 g/dL (ref 6.5–8.1)

## 2019-01-23 NOTE — Progress Notes (Signed)
[place], [place] [address]   CLINIC:  Medical Oncology/Hematology  PCP:  Dettinger, Fransisca Kaufmann, MD [place] [id number] [phone]   REASON FOR VISIT:  Follow-up for IDA  CURRENT THERAPY: Clinical surveillance    INTERVAL HISTORY:  Tasha Clarke 68 y.o. female presents today for follow-up.  Reports overall doing fair.  She reports significant fatigue.  She received 2 infusions of IV iron; the last infusion on January 17, 2019.  She states she normally feels better immediately after iron infusion.  She reports intermittent epitixis.  This is occurring 3-4 times a day. She states the nosebleeds resolve without any intervention. She is on continuous O2 via nasal cannula. She also reports increase in headaches.  She reports change in vision in her right eye. She states her vision turns black at times.  REVIEW OF SYSTEMS:  Review of Systems  Constitutional: Positive for fatigue.  HENT:   Positive for nosebleeds.   Eyes: Negative.   Respiratory: Positive for shortness of breath.   Cardiovascular: Negative.   Gastrointestinal: Negative.   Endocrine: Negative.   Genitourinary: Negative.    Musculoskeletal: Positive for arthralgias, back pain, gait problem and myalgias.  Skin: Negative.   Neurological: Positive for extremity weakness and gait problem.  Hematological: Negative.   Psychiatric/Behavioral: Negative.      PAST MEDICAL/SURGICAL HISTORY:  Past Medical History:  Diagnosis Date  . Allergy   . Anemia   . Anxiety   . Arthritis   . Asthma   . Blood transfusion without reported diagnosis   . Cataract   . CHF (congestive heart failure) ([place])   . Chronic kidney disease   . Clotting disorder ([place])   . COPD (chronic obstructive pulmonary disease) ([place])   . Depression   . Diabetes mellitus without complication ([place])    pt states she is not diabetic   . Emphysema of lung ([place])   . GERD (gastroesophageal reflux  disease)   . Hyperlipidemia   . Hypertension   . Neuromuscular disorder ([place])   . Osteoporosis   . Seizures ([place])   . Skin cancer    2 areas removed more than 10 years ago  . Sleep apnea 1985   wears cpap nightly  . Thyroid disease    Past Surgical History:  Procedure Laterality Date  . APPENDECTOMY    . BIOPSY  03/16/2018   Procedure: BIOPSY;  Surgeon: Daneil Dolin, MD;  Location: [facility];  Service: Endoscopy;;  gastric bx's  . CAROTID ENDARTERECTOMY Bilateral   . CERVICAL SPINE SURGERY     after neck fracture  . ESOPHAGOGASTRODUODENOSCOPY (EGD) WITH PROPOFOL N/A 03/16/2018   Procedure: ESOPHAGOGASTRODUODENOSCOPY (EGD) WITH PROPOFOL;  Surgeon: Daneil Dolin, MD;  Location: [facility];  Service: Endoscopy;  Laterality: N/A;  3:00pm  . EYE SURGERY    . FRACTURE SURGERY    . heart stents    . JOINT REPLACEMENT     Right knee  . SPINAL CORD STIMULATOR IMPLANT  2015  . SPINE SURGERY     lumbar x4      SOCIAL HISTORY:  Social History   Socioeconomic History  . Marital status: Married    Spouse name: Not on file  . Number of children: 2  . Years of education: 75  . Highest education level: Some college, no degree  Occupational History  . Occupation: Retired  Scientific laboratory technician  . Financial resource strain: Not hard  at all  . Food insecurity    Worry: Never true    Inability: Never true  . Transportation needs    Medical: No    Non-medical: No  Tobacco Use  . Smoking status: Current Every Day Smoker    Packs/day: 1.00    Years: 43.00    Pack years: 43.00    Types: Cigarettes  . Smokeless tobacco: Never Used  Substance and Sexual Activity  . Alcohol use: Yes    Frequency: Never    Comment: occasionally, not even 1 drink per month   . Drug use: No  . Sexual activity: Not Currently    Birth control/protection: Post-menopausal  Lifestyle  . Physical activity    Days per week: 0 days    Minutes per session: 0 min  . Stress: To some extent  Relationships   . Social connections    Talks on phone: More than three times a week    Gets together: Three times a week    Attends religious service: Never    Active member of club or organization: No    Attends meetings of clubs or organizations: Never    Relationship status: Married  . Intimate partner violence    Fear of current or ex partner: No    Emotionally abused: No    Physically abused: No    Forced sexual activity: No  Other Topics Concern  . Not on file  Social History Narrative   Lives at home with husband and son   Right hand dominant   Caffeine: 4-5 cups daily (coffee & mtn dew)    FAMILY HISTORY:  Family History  Adopted: Yes  Problem Relation Age of Onset  . Arthritis Mother   . Asthma Mother   . Cancer Mother        lung  . Depression Mother   . Diabetes Mother   . Arthritis Father   . Asthma Father   . Depression Father   . Asthma Sister   . Depression Sister   . Diabetes Sister   . Arthritis Maternal Grandmother   . Asthma Maternal Grandmother   . Depression Maternal Grandmother   . Arthritis Maternal Grandfather   . Asthma Maternal Grandfather   . Arthritis Paternal Grandmother   . Asthma Paternal Grandmother   . Arthritis Paternal Grandfather   . Asthma Paternal Grandfather   . COPD Paternal Grandfather   . Diabetes Sister   . Mental illness Sister   . Anemia Sister   . Hypertension Son   . Post-traumatic stress disorder Son   . Allergies Daughter   . Breast cancer Neg Hx   . Seizures Neg Hx        not that she knows of, pt adopted    CURRENT MEDICATIONS:  Outpatient Encounter Medications as of 01/23/2019  Medication Sig Note  . albuterol (PROVENTIL HFA) 108 (90 Base) MCG/ACT inhaler Inhale into the lungs.   Marland Kitchen alendronate (FOSAMAX) 70 MG tablet Take 1 tablet (70 mg total) by mouth every Monday. Take with a full glass of water on an empty stomach.   Marland Kitchen aspirin EC 81 MG tablet Take 81 mg by mouth daily.   . buprenorphine (BUTRANS) 10 MCG/HR PTWK  patch Place 10 mcg onto the skin once a week.   . Buprenorphine 15 MCG/HR PTWK APPLY 1 PATCH TO SKIN ONCE A WEEK FOR 28 DAYS   . butalbital-acetaminophen-caffeine (FIORICET, ESGIC) 50-325-40 MG tablet Take by mouth 2 (two) times daily as needed  for headache or migraine.   . Cholecalciferol (VITAMIN D-3) 5000 units TABS Take 5,000 Units by mouth daily.   . Cyanocobalamin (VITAMIN B-12 PO) Take by mouth daily.   . cyanocobalamin 100 MCG tablet Take 100 mcg by mouth.   . cyclobenzaprine (FLEXERIL) 10 MG tablet TAKE 1 TABLET BY MOUTH TWICE DAILY AS NEEDED FOR MUSCLE SPASM   . dicyclomine (BENTYL) 10 MG capsule Take 1 capsule (10 mg total) by mouth 4 (four) times daily as needed (for diarrhea).   . diphenhydramine-acetaminophen (TYLENOL PM EXTRA STRENGTH) 25-500 MG TABS tablet Take 1 tablet by mouth at bedtime as needed (pain).    . DULoxetine (CYMBALTA) 30 MG capsule TAKE 3 CAPSULES BY MOUTH ONCE DAILY   . EPINEPHrine 0.3 mg/0.3 mL IJ SOAJ injection Inject 0.3 mg into the muscle as needed. 03/28/2018: Has an epipen. Is allergic to bees.  Noelle Penner ALLERGY RELIEF, CETIRIZINE, 10 MG tablet Take 1 tablet by mouth once daily   . Ferrous Sulfate (IRON) 325 (65 Fe) MG TABS Take 1 tablet (325 mg total) by mouth daily.   . fluticasone (FLONASE) 50 MCG/ACT nasal spray Place 2 sprays into both nostrils daily.   . Fluticasone-Umeclidin-Vilant (TRELEGY ELLIPTA) 100-62.5-25 MCG/INH AEPB Inhale into the lungs 1 day or 1 dose.   . folic acid (FOLVITE) 1 MG tablet Take 1 mg by mouth daily.   Marland Kitchen gabapentin (NEURONTIN) 600 MG tablet Take 1 tablet (600 mg total) by mouth 4 (four) times daily.   Marland Kitchen ibuprofen (ADVIL,MOTRIN) 200 MG tablet Take 200 mg by mouth every 6 (six) hours as needed.   Marland Kitchen ipratropium-albuterol (DUONEB) 0.5-2.5 (3) MG/3ML SOLN Take 3 mLs by nebulization every 6 (six) hours as needed (shortness of breathe).    . levETIRAcetam (KEPPRA) 1000 MG tablet TAKE 1 TABLET BY MOUTH AT BEDTIME   . levETIRAcetam (KEPPRA)  750 MG tablet TAKE 1 TABLET BY MOUTH IN THE MORNING   . levothyroxine (SYNTHROID, LEVOTHROID) 50 MCG tablet Take 1 tablet (50 mcg total) by mouth daily before breakfast.   . Magnesium 400 MG CAPS Take 400 mg by mouth daily.   . Melatonin 10 MG TABS Take by mouth.   . metoprolol tartrate (LOPRESSOR) 25 MG tablet Take 12.5 mg by mouth 2 (two) times daily.   . Multiple Vitamin (MULTI-VITAMIN) tablet Take by mouth.   . Multiple Vitamin (MULTIVITAMIN) tablet Take 1 tablet by mouth daily.   . naloxone (NARCAN) nasal spray 4 mg/0.1 mL Place into the nose. 03/28/2018: Has but has never needed to use it.  . Omega-3 Fatty Acids (FISH OIL) 1000 MG CAPS Take 1,000 mg by mouth daily.   . ondansetron (ZOFRAN) 4 MG tablet TAKE 1 TABLET BY MOUTH 4 TIMES DAILY AS NEEDED   . potassium chloride (K-DUR) 10 MEQ tablet Take 1 tablet by mouth daily.   . rosuvastatin (CRESTOR) 40 MG tablet Take 1 tablet (40 mg total) by mouth daily.    No facility-administered encounter medications on file as of 01/23/2019.     ALLERGIES:  Allergies  Allergen Reactions  . Cephalexin Hives  . Ketorolac Tromethamine Hives  . [place] Nausea And Vomiting  . Imdur  [Isosorbide Dinitrate]   . Iodinated Diagnostic Agents   . Other Other (See Comments)    Alka seltzer causes blurred vision, fainting  . Sodium Bicarbonate-Citric Acid   . Milk-Related Compounds Nausea And Vomiting     PHYSICAL EXAM:  ECOG Performance status: 2  Vitals:   01/23/19 1500  Pulse: 78  Resp: 16  Temp: 97.9 F (36.6 C)  SpO2: 100%   Filed Weights    Physical Exam Constitutional:      Appearance: Normal appearance. She is obese.  HENT:     Head: Normocephalic.     Nose: Nose normal.     Mouth/Throat:     Mouth: Mucous membranes are moist.     Pharynx: Oropharynx is clear.  Eyes:     Extraocular Movements: Extraocular movements intact.     Conjunctiva/sclera: Conjunctivae normal.  Neck:     Musculoskeletal: Normal range of motion.   Cardiovascular:     Rate and Rhythm: Normal rate and regular rhythm.     Pulses: Normal pulses.     Heart sounds: Normal heart sounds.  Pulmonary:     Effort: Pulmonary effort is normal.     Breath sounds: Normal breath sounds.  Abdominal:     General: Abdomen is flat. Bowel sounds are normal.     Palpations: Abdomen is soft.  Musculoskeletal: Normal range of motion.  Skin:    General: Skin is warm.  Neurological:     General: No focal deficit present.     Mental Status: She is alert and oriented to person, place, and time.  Psychiatric:        Mood and Affect: Mood normal.        Behavior: Behavior normal.        Thought Content: Thought content normal.        Judgment: Judgment normal.      LABORATORY DATA:  I have reviewed the labs as listed.  CBC    Component Value Date/Time   WBC 7.7 12/28/2018 1338   RBC 3.95 12/28/2018 1338   HGB 10.9 (L) 12/28/2018 1338   HGB 11.0 (L) 07/26/2018 0929   HCT 36.5 12/28/2018 1338   HCT 33.7 (L) 07/26/2018 0929   PLT 306 12/28/2018 1338   PLT 308 07/26/2018 0929   MCV 92.4 12/28/2018 1338   MCV 91 07/26/2018 0929   MCH 27.6 12/28/2018 1338   MCHC 29.9 (L) 12/28/2018 1338   RDW 16.0 (H) 12/28/2018 1338   RDW 15.9 (H) 07/26/2018 0929   LYMPHSABS 1.9 12/28/2018 1338   LYMPHSABS 1.5 07/26/2018 0929   MONOABS 0.7 12/28/2018 1338   EOSABS 0.3 12/28/2018 1338   EOSABS 0.3 07/26/2018 0929   BASOSABS 0.1 12/28/2018 1338   BASOSABS 0.1 07/26/2018 0929   CMP Latest Ref Rng & Units 12/28/2018 07/26/2018 06/14/2018  Glucose 70 - 99 mg/dL 119(H) 89 125(H)  BUN 8 - 23 mg/dL 23 23 24(H)  Creatinine 0.44 - 1.00 mg/dL 1.07(H) 0.95 0.88  Sodium 135 - 145 mmol/L 138 134 135  Potassium 3.5 - 5.1 mmol/L 3.7 5.3(H) 4.8  Chloride 98 - 111 mmol/L 105 102 104  CO2 22 - 32 mmol/L 23 21 25   Calcium 8.9 - 10.3 mg/dL 8.6(L) 9.7 9.6  Total Protein 6.5 - 8.1 g/dL 7.0 6.5 7.3  Total Bilirubin 0.3 - 1.2 mg/dL 0.3 <0.2 0.1(L)  Alkaline Phos 38 - 126 U/L  56 70 64  AST 15 - 41 U/L 21 22 19   ALT 0 - 44 U/L 13 19 16       ASSESSMENT & PLAN:   Iron deficiency anemia 1.  Normocytic anemia: -Etiology is iron deficiency and mild degree of CKD. - Last blood transfusion in February 2019, 2 units. - Colonoscopy on 01/25/2011, very tortuous colon with a 0.5 cm polyp in the transverse colon. -  Feraheme infusion on 06/30/2018 and 07/10/2018. - Feraheme infusion on 01/10/2019 and 01/17/2019. - Hemoglobin has improved to 12.5, platelets are adequate at 308k.   -Overall, patient's hemoglobin has responded well to the IV iron infusions.  Patient's amount of significant fatigue is most likely secondary to sedentary lifestyle and heavy tobacco use. - Concerning patient's new onset of daily headaches and vision changes in the right eye, have recommended patient proceed with MRI of the brain to determine etiology.  Have also recommended patient follow with ophthalmology for an eye exam.   - We will see the patient back in 1 week.   2.  Smoking history: - CT chest low-dose scan on 08/07/2018 was lung RADS 2. - We will repeat scan in 12 months.      Orders placed this encounter:  Orders Placed This Encounter  Procedures  . MR Brain W Contrast  . MR Brain Wo Contrast  . CBC with Differential  . Comprehensive metabolic panel      Roger Shelter, [place] [phone]

## 2019-01-23 NOTE — Assessment & Plan Note (Addendum)
1.  Normocytic anemia: -Etiology is iron deficiency and mild degree of CKD. - Last blood transfusion in February 2019, 2 units. - Colonoscopy on 01/25/2011, very tortuous colon with a 0.5 cm polyp in the transverse colon. - Feraheme infusion on 06/30/2018 and 07/10/2018. - Feraheme infusion on 01/10/2019 and 01/17/2019. - Hemoglobin has improved to 12.5, platelets are adequate at 308k.   -Overall, patient's hemoglobin has responded well to the IV iron infusions.  Patient's amount of significant fatigue is most likely secondary to sedentary lifestyle and heavy tobacco use. - Concerning patient's new onset of daily headaches and vision changes in the right eye, have recommended patient proceed with MRI of the brain to determine etiology.  Have also recommended patient follow with ophthalmology for an eye exam.   - We will see the patient back in 1 week.   2.  Smoking history: - CT chest low-dose scan on 08/07/2018 was lung RADS 2. - We will repeat scan in 12 months.

## 2019-01-26 ENCOUNTER — Ambulatory Visit (HOSPITAL_COMMUNITY): Admission: RE | Admit: 2019-01-26 | Payer: Medicare PPO | Source: Ambulatory Visit

## 2019-01-29 ENCOUNTER — Ambulatory Visit (HOSPITAL_COMMUNITY): Payer: Medicare PPO | Admitting: Hematology

## 2019-01-30 ENCOUNTER — Other Ambulatory Visit: Payer: Self-pay

## 2019-01-30 ENCOUNTER — Ambulatory Visit (HOSPITAL_COMMUNITY): Payer: Medicare PPO | Admitting: Hematology

## 2019-01-30 ENCOUNTER — Telehealth: Payer: Self-pay | Admitting: Family Medicine

## 2019-01-30 NOTE — Telephone Encounter (Signed)
Patient has apt with Dr. Warrick Parisian tomorrow - states she has cough and SOB that she always has. No new symptoms and no fever. Advise if patient can be seen or has to be a tele visit.

## 2019-01-30 NOTE — Telephone Encounter (Signed)
Per Dr. Warrick Parisian patient has to be a tele visit - left detailed message on VM

## 2019-01-31 ENCOUNTER — Ambulatory Visit (INDEPENDENT_AMBULATORY_CARE_PROVIDER_SITE_OTHER): Payer: Medicare PPO | Admitting: Family Medicine

## 2019-01-31 ENCOUNTER — Encounter: Payer: Self-pay | Admitting: Family Medicine

## 2019-01-31 DIAGNOSIS — I10 Essential (primary) hypertension: Secondary | ICD-10-CM

## 2019-01-31 DIAGNOSIS — J439 Emphysema, unspecified: Secondary | ICD-10-CM | POA: Diagnosis not present

## 2019-01-31 DIAGNOSIS — F339 Major depressive disorder, recurrent, unspecified: Secondary | ICD-10-CM

## 2019-01-31 DIAGNOSIS — E039 Hypothyroidism, unspecified: Secondary | ICD-10-CM

## 2019-01-31 MED ORDER — DULOXETINE HCL 30 MG PO CPEP: 90.0000 mg | ORAL_CAPSULE | Freq: Every day | ORAL | 3 refills | Status: DC

## 2019-01-31 MED ORDER — LEVETIRACETAM 1000 MG PO TABS: 1000.0000 mg | ORAL_TABLET | Freq: Every day | ORAL | 3 refills | Status: DC

## 2019-01-31 MED ORDER — QUETIAPINE FUMARATE 50 MG PO TABS: 50.0000 mg | ORAL_TABLET | Freq: Every day | ORAL | 1 refills | Status: DC

## 2019-01-31 MED ORDER — EPINEPHRINE 0.3 MG/0.3ML IJ SOAJ: 0.3000 mg | INTRAMUSCULAR | 1 refills | Status: AC | PRN

## 2019-01-31 MED ORDER — LEVOTHYROXINE SODIUM 50 MCG PO TABS: 50.0000 ug | ORAL_TABLET | Freq: Every day | ORAL | 3 refills | Status: DC

## 2019-01-31 MED ORDER — LEVETIRACETAM 750 MG PO TABS: 750.0000 mg | ORAL_TABLET | Freq: Every morning | ORAL | 3 refills | Status: DC

## 2019-01-31 NOTE — Progress Notes (Signed)
Virtual Visit via telephone Note  I connected with Tasha Clarke on 01/31/19 at 0829 by telephone and verified that I am speaking with the correct person using two identifiers. Tasha Clarke is currently located at home and husband  [name] are currently with her during visit. The provider, Fransisca Kaufmann Petar Mucci, MD is located in their office at time of visit.  Call ended at [phone]  I discussed the limitations, risks, security and privacy concerns of performing an evaluation and management service by telephone and the availability of in person appointments. I also discussed with the patient that there may be a patient responsible charge related to this service. The patient expressed understanding and agreed to proceed.   History and Present Illness: Patient has been going to [facility] getting iron infusions every 2 weeks and has been having nosebleeds and blood in urine and blood in stool.  This started when she began iron infusions.  She is seeing hematology for this.   Hypothyroidism recheck Patient is coming in for thyroid recheck today as well. They deny any issues with hair changes or heat or cold problems or diarrhea or constipation. They deny any chest pain or palpitations. They are currently on levothyroxine 43micrograms   Hypertension Patient is currently on metoprolol, and their blood pressure today is unknown because did not have a way to check it today. Patient denies any lightheadedness or dizziness. Patient denies headaches, blurred vision, chest pains, shortness of breath, or weakness. Denies any side effects from medication and is content with current medication.   Depression and anxiety and insomnia Patient is taking melatonin and tylenol pm and they are not helping anymore. She is having a lot of anxiety and crying a lot and feeling down and anxious. she feels like her depression is very much elevated and would like to try something and also is  having well trouble sleeping.  She denies any suicidal ideations or thoughts of hurting herself.  She is having respiratory symptoms of her copd that are no worse than she normally has and is stable and she feels like it is stable but she would like to be tested.   No diagnosis found.  Outpatient Encounter Medications as of 01/31/2019  Medication Sig   albuterol (PROVENTIL HFA) 108 (90 Base) MCG/ACT inhaler Inhale into the lungs.   alendronate (FOSAMAX) 70 MG tablet Take 1 tablet (70 mg total) by mouth every Monday. Take with a full glass of water on an empty stomach.   aspirin EC 81 MG tablet Take 81 mg by mouth daily.   buprenorphine (BUTRANS) 10 MCG/HR PTWK patch Place 10 mcg onto the skin once a week.   Buprenorphine 15 MCG/HR PTWK APPLY 1 PATCH TO SKIN ONCE A WEEK FOR 28 DAYS   butalbital-acetaminophen-caffeine (FIORICET, ESGIC) 50-325-40 MG tablet Take by mouth 2 (two) times daily as needed for headache or migraine.   Cholecalciferol (VITAMIN D-3) 5000 units TABS Take 5,000 Units by mouth daily.   Cyanocobalamin (VITAMIN B-12 PO) Take by mouth daily.   cyanocobalamin 100 MCG tablet Take 100 mcg by mouth.   cyclobenzaprine (FLEXERIL) 10 MG tablet TAKE 1 TABLET BY MOUTH TWICE DAILY AS NEEDED FOR MUSCLE SPASM   dicyclomine (BENTYL) 10 MG capsule Take 1 capsule (10 mg total) by mouth 4 (four) times daily as needed (for diarrhea).   diphenhydramine-acetaminophen (TYLENOL PM EXTRA STRENGTH) 25-500 MG TABS tablet Take 1 tablet by mouth at bedtime as needed (pain).    DULoxetine (CYMBALTA)  30 MG capsule TAKE 3 CAPSULES BY MOUTH ONCE DAILY   EPINEPHrine 0.3 mg/0.3 mL IJ SOAJ injection Inject 0.3 mg into the muscle as needed.   EQ ALLERGY RELIEF, CETIRIZINE, 10 MG tablet Take 1 tablet by mouth once daily   Ferrous Sulfate (IRON) 325 (65 Fe) MG TABS Take 1 tablet (325 mg total) by mouth daily.   fluticasone (FLONASE) 50 MCG/ACT nasal spray Place 2 sprays into both nostrils daily.     Fluticasone-Umeclidin-Vilant (TRELEGY ELLIPTA) 100-62.5-25 MCG/INH AEPB Inhale into the lungs 1 day or 1 dose.   folic acid (FOLVITE) 1 MG tablet Take 1 mg by mouth daily.   gabapentin (NEURONTIN) 600 MG tablet Take 1 tablet (600 mg total) by mouth 4 (four) times daily.   ibuprofen (ADVIL,MOTRIN) 200 MG tablet Take 200 mg by mouth every 6 (six) hours as needed.   ipratropium-albuterol (DUONEB) 0.5-2.5 (3) MG/3ML SOLN Take 3 mLs by nebulization every 6 (six) hours as needed (shortness of breathe).    levETIRAcetam (KEPPRA) 1000 MG tablet TAKE 1 TABLET BY MOUTH AT BEDTIME   levETIRAcetam (KEPPRA) 750 MG tablet TAKE 1 TABLET BY MOUTH IN THE MORNING   levothyroxine (SYNTHROID, LEVOTHROID) 50 MCG tablet Take 1 tablet (50 mcg total) by mouth daily before breakfast.   Magnesium 400 MG CAPS Take 400 mg by mouth daily.   Melatonin 10 MG TABS Take by mouth.   metoprolol tartrate (LOPRESSOR) 25 MG tablet Take 12.5 mg by mouth 2 (two) times daily.   Multiple Vitamin (MULTI-VITAMIN) tablet Take by mouth.   Multiple Vitamin (MULTIVITAMIN) tablet Take 1 tablet by mouth daily.   naloxone (NARCAN) nasal spray 4 mg/0.1 mL Place into the nose.   Omega-3 Fatty Acids (FISH OIL) 1000 MG CAPS Take 1,000 mg by mouth daily.   ondansetron (ZOFRAN) 4 MG tablet TAKE 1 TABLET BY MOUTH 4 TIMES DAILY AS NEEDED   potassium chloride (K-DUR) 10 MEQ tablet Take 1 tablet by mouth daily.   rosuvastatin (CRESTOR) 40 MG tablet Take 1 tablet (40 mg total) by mouth daily.   No facility-administered encounter medications on file as of 01/31/2019.     Review of Systems  Constitutional: Negative for chills and fever.  HENT: Positive for congestion. Negative for ear discharge and ear pain.   Eyes: Negative for redness and visual disturbance.  Respiratory: Positive for cough, shortness of breath and wheezing. Negative for chest tightness.   Cardiovascular: Negative for chest pain and leg swelling.   Musculoskeletal: Negative for back pain and gait problem.  Skin: Negative for rash.  Neurological: Negative for light-headedness and headaches.  Psychiatric/Behavioral: Negative for agitation and behavioral problems.  All other systems reviewed and are negative.   Observations/Objective: Patient sounds comfortable and in no acute distress  Assessment and Plan: Problem List Items Addressed This Visit      Cardiovascular and Mediastinum   Essential hypertension   Relevant Medications   EPINEPHrine 0.3 mg/0.3 mL IJ SOAJ injection     Respiratory   COPD (chronic obstructive pulmonary disease) (HCC)   Relevant Orders   Novel Coronavirus, NAA ([facility])     Endocrine   Hypothyroid - Primary   Relevant Medications   levothyroxine (SYNTHROID) 50 MCG tablet     Other   Depression, recurrent (HCC)   Relevant Medications   DULoxetine (CYMBALTA) 30 MG capsule   QUEtiapine (SEROQUEL) 50 MG tablet       Follow Up Instructions: Follow-up in 4 weeks for depression recheck    I discussed  the assessment and treatment plan with the patient. The patient was provided an opportunity to ask questions and all were answered. The patient agreed with the plan and demonstrated an understanding of the instructions.   The patient was advised to call back or seek an in-person evaluation if the symptoms worsen or if the condition fails to improve as anticipated.  The above assessment and management plan was discussed with the patient. The patient verbalized understanding of and has agreed to the management plan. Patient is aware to call the clinic if symptoms persist or worsen. Patient is aware when to return to the clinic for a follow-up visit. Patient educated on when it is appropriate to go to the emergency department.    I provided 27 minutes of non-face-to-face time during this encounter.    Worthy Rancher, MD

## 2019-02-01 ENCOUNTER — Other Ambulatory Visit: Payer: Self-pay

## 2019-02-01 ENCOUNTER — Encounter (HOSPITAL_COMMUNITY): Payer: Self-pay

## 2019-02-01 ENCOUNTER — Ambulatory Visit (HOSPITAL_COMMUNITY): Payer: Medicare PPO

## 2019-02-01 DIAGNOSIS — R6889 Other general symptoms and signs: Secondary | ICD-10-CM | POA: Diagnosis not present

## 2019-02-01 DIAGNOSIS — Z20822 Contact with and (suspected) exposure to covid-19: Secondary | ICD-10-CM

## 2019-02-04 LAB — NOVEL CORONAVIRUS, NAA: SARS-CoV-2, NAA: NOT DETECTED

## 2019-02-05 ENCOUNTER — Ambulatory Visit (HOSPITAL_COMMUNITY): Payer: Medicare PPO | Admitting: Hematology

## 2019-02-08 ENCOUNTER — Encounter (HOSPITAL_COMMUNITY)
Admission: RE | Admit: 2019-02-08 | Discharge: 2019-02-08 | Disposition: A | Discharge status: Home / Self Care | Payer: Medicare PPO | Source: Ambulatory Visit | Attending: Internal Medicine | Admitting: Internal Medicine

## 2019-02-08 ENCOUNTER — Other Ambulatory Visit: Payer: Self-pay

## 2019-02-08 ENCOUNTER — Other Ambulatory Visit (HOSPITAL_COMMUNITY)
Admission: RE | Admit: 2019-02-08 | Discharge: 2019-02-08 | Disposition: A | Discharge status: Home / Self Care | Payer: Medicare PPO | Source: Ambulatory Visit | Attending: Internal Medicine | Admitting: Internal Medicine

## 2019-02-08 ENCOUNTER — Encounter (HOSPITAL_COMMUNITY): Payer: Self-pay

## 2019-02-09 ENCOUNTER — Other Ambulatory Visit (HOSPITAL_COMMUNITY)
Admission: RE | Admit: 2019-02-09 | Discharge: 2019-02-09 | Disposition: A | Discharge status: Home / Self Care | Payer: Medicare PPO | Source: Ambulatory Visit | Attending: Internal Medicine | Admitting: Internal Medicine

## 2019-02-09 DIAGNOSIS — Z20828 Contact with and (suspected) exposure to other viral communicable diseases: Secondary | ICD-10-CM | POA: Diagnosis not present

## 2019-02-09 LAB — SARS CORONAVIRUS 2 (TAT 6-24 HRS): SARS Coronavirus 2: NEGATIVE

## 2019-02-12 ENCOUNTER — Encounter (HOSPITAL_COMMUNITY): Payer: Self-pay | Admitting: *Deleted

## 2019-02-12 ENCOUNTER — Ambulatory Visit (HOSPITAL_COMMUNITY)
Admission: RE | Admit: 2019-02-12 | Discharge: 2019-02-12 | Disposition: A | Discharge status: Home / Self Care | Payer: Medicare PPO | Attending: Internal Medicine | Admitting: Internal Medicine

## 2019-02-12 ENCOUNTER — Ambulatory Visit (HOSPITAL_COMMUNITY): Payer: Medicare PPO | Admitting: Anesthesiology

## 2019-02-12 ENCOUNTER — Encounter (HOSPITAL_COMMUNITY)
Admission: RE | Disposition: A | Discharge status: Home / Self Care | Payer: Medicare PPO | Source: Home / Self Care | Attending: Internal Medicine

## 2019-02-12 ENCOUNTER — Other Ambulatory Visit: Payer: Self-pay

## 2019-02-12 DIAGNOSIS — K259 Gastric ulcer, unspecified as acute or chronic, without hemorrhage or perforation: Secondary | ICD-10-CM | POA: Diagnosis not present

## 2019-02-12 DIAGNOSIS — G4730 Sleep apnea, unspecified: Secondary | ICD-10-CM | POA: Insufficient documentation

## 2019-02-12 DIAGNOSIS — M810 Age-related osteoporosis without current pathological fracture: Secondary | ICD-10-CM | POA: Insufficient documentation

## 2019-02-12 DIAGNOSIS — K2950 Unspecified chronic gastritis without bleeding: Secondary | ICD-10-CM | POA: Diagnosis not present

## 2019-02-12 DIAGNOSIS — R1310 Dysphagia, unspecified: Secondary | ICD-10-CM | POA: Diagnosis not present

## 2019-02-12 DIAGNOSIS — Z7983 Long term (current) use of bisphosphonates: Secondary | ICD-10-CM | POA: Insufficient documentation

## 2019-02-12 DIAGNOSIS — R1319 Other dysphagia: Secondary | ICD-10-CM

## 2019-02-12 DIAGNOSIS — M1990 Unspecified osteoarthritis, unspecified site: Secondary | ICD-10-CM | POA: Diagnosis not present

## 2019-02-12 DIAGNOSIS — D649 Anemia, unspecified: Secondary | ICD-10-CM | POA: Insufficient documentation

## 2019-02-12 DIAGNOSIS — F419 Anxiety disorder, unspecified: Secondary | ICD-10-CM | POA: Insufficient documentation

## 2019-02-12 DIAGNOSIS — K3189 Other diseases of stomach and duodenum: Secondary | ICD-10-CM | POA: Insufficient documentation

## 2019-02-12 DIAGNOSIS — N189 Chronic kidney disease, unspecified: Secondary | ICD-10-CM | POA: Insufficient documentation

## 2019-02-12 DIAGNOSIS — Z7982 Long term (current) use of aspirin: Secondary | ICD-10-CM | POA: Insufficient documentation

## 2019-02-12 DIAGNOSIS — K210 Gastro-esophageal reflux disease with esophagitis: Secondary | ICD-10-CM | POA: Insufficient documentation

## 2019-02-12 DIAGNOSIS — Z96651 Presence of right artificial knee joint: Secondary | ICD-10-CM | POA: Insufficient documentation

## 2019-02-12 DIAGNOSIS — E039 Hypothyroidism, unspecified: Secondary | ICD-10-CM | POA: Insufficient documentation

## 2019-02-12 DIAGNOSIS — F329 Major depressive disorder, single episode, unspecified: Secondary | ICD-10-CM | POA: Diagnosis not present

## 2019-02-12 DIAGNOSIS — I130 Hypertensive heart and chronic kidney disease with heart failure and stage 1 through stage 4 chronic kidney disease, or unspecified chronic kidney disease: Secondary | ICD-10-CM | POA: Diagnosis not present

## 2019-02-12 DIAGNOSIS — Z79899 Other long term (current) drug therapy: Secondary | ICD-10-CM | POA: Diagnosis not present

## 2019-02-12 DIAGNOSIS — R112 Nausea with vomiting, unspecified: Secondary | ICD-10-CM

## 2019-02-12 DIAGNOSIS — I509 Heart failure, unspecified: Secondary | ICD-10-CM | POA: Diagnosis not present

## 2019-02-12 DIAGNOSIS — K449 Diaphragmatic hernia without obstruction or gangrene: Secondary | ICD-10-CM

## 2019-02-12 DIAGNOSIS — K222 Esophageal obstruction: Secondary | ICD-10-CM | POA: Diagnosis not present

## 2019-02-12 DIAGNOSIS — R569 Unspecified convulsions: Secondary | ICD-10-CM | POA: Insufficient documentation

## 2019-02-12 DIAGNOSIS — F17210 Nicotine dependence, cigarettes, uncomplicated: Secondary | ICD-10-CM | POA: Diagnosis not present

## 2019-02-12 DIAGNOSIS — E785 Hyperlipidemia, unspecified: Secondary | ICD-10-CM | POA: Insufficient documentation

## 2019-02-12 DIAGNOSIS — Z9981 Dependence on supplemental oxygen: Secondary | ICD-10-CM | POA: Insufficient documentation

## 2019-02-12 DIAGNOSIS — Z85828 Personal history of other malignant neoplasm of skin: Secondary | ICD-10-CM | POA: Insufficient documentation

## 2019-02-12 DIAGNOSIS — Z79890 Hormone replacement therapy (postmenopausal): Secondary | ICD-10-CM | POA: Insufficient documentation

## 2019-02-12 DIAGNOSIS — J439 Emphysema, unspecified: Secondary | ICD-10-CM | POA: Insufficient documentation

## 2019-02-12 DIAGNOSIS — Z7951 Long term (current) use of inhaled steroids: Secondary | ICD-10-CM | POA: Insufficient documentation

## 2019-02-12 DIAGNOSIS — K209 Esophagitis, unspecified: Secondary | ICD-10-CM | POA: Diagnosis not present

## 2019-02-12 HISTORY — PX: ESOPHAGOGASTRODUODENOSCOPY (EGD) WITH PROPOFOL: SHX5813

## 2019-02-12 HISTORY — PX: MALONEY DILATION: SHX5535

## 2019-02-12 HISTORY — PX: BIOPSY: SHX5522

## 2019-02-12 SURGERY — ESOPHAGOGASTRODUODENOSCOPY (EGD) WITH PROPOFOL
Anesthesia: General

## 2019-02-12 MED ORDER — IPRATROPIUM-ALBUTEROL 0.5-2.5 (3) MG/3ML IN SOLN
3.0000 mL | Freq: Four times a day (QID) | RESPIRATORY_TRACT | Status: DC
  Administered 2019-02-12: 13:00:00 3 mL via RESPIRATORY_TRACT

## 2019-02-12 MED ORDER — PROPOFOL 10 MG/ML IV BOLUS
INTRAVENOUS | Status: DC | PRN
  Administered 2019-02-12: 20 mg via INTRAVENOUS

## 2019-02-12 MED ORDER — PROPOFOL 500 MG/50ML IV EMUL
INTRAVENOUS | Status: DC | PRN
  Administered 2019-02-12: 150 ug/kg/min via INTRAVENOUS

## 2019-02-12 MED ORDER — PROPOFOL 10 MG/ML IV BOLUS
INTRAVENOUS | Status: AC
  Filled 2019-02-12: qty 40

## 2019-02-12 MED ORDER — KETAMINE HCL 50 MG/5ML IJ SOSY
PREFILLED_SYRINGE | INTRAMUSCULAR | Status: AC
  Filled 2019-02-12: qty 5

## 2019-02-12 MED ORDER — CHLORHEXIDINE GLUCONATE CLOTH 2 % EX PADS: 6.0000 | MEDICATED_PAD | Freq: Once | CUTANEOUS | Status: DC

## 2019-02-12 MED ORDER — LIDOCAINE HCL (CARDIAC) PF 100 MG/5ML IV SOSY
PREFILLED_SYRINGE | INTRAVENOUS | Status: DC | PRN
  Administered 2019-02-12: 40 mg via INTRAVENOUS

## 2019-02-12 MED ORDER — IPRATROPIUM-ALBUTEROL 0.5-2.5 (3) MG/3ML IN SOLN
RESPIRATORY_TRACT | Status: AC
  Filled 2019-02-12: qty 3

## 2019-02-12 MED ORDER — LACTATED RINGERS IV SOLN
INTRAVENOUS | Status: DC | PRN
  Administered 2019-02-12: 13:00:00 via INTRAVENOUS

## 2019-02-12 MED ORDER — KETAMINE HCL 10 MG/ML IJ SOLN
INTRAMUSCULAR | Status: DC | PRN
  Administered 2019-02-12: 20 mg via INTRAVENOUS

## 2019-02-12 MED ORDER — GLYCOPYRROLATE 0.2 MG/ML IJ SOLN
INTRAMUSCULAR | Status: DC | PRN
  Administered 2019-02-12: 0.2 mg via INTRAVENOUS

## 2019-02-12 NOTE — Discharge Instructions (Signed)
EGD Discharge instructions Please read the instructions outlined below and refer to this sheet in the next few weeks. These discharge instructions provide you with general information on caring for yourself after you leave the hospital. Your doctor may also give you specific instructions. While your treatment has been planned according to the most current medical practices available, unavoidable complications occasionally occur. If you have any problems or questions after discharge, please call your doctor. ACTIVITY  You may resume your regular activity but move at a slower pace for the next 24 hours.   Take frequent rest periods for the next 24 hours.   Walking will help expel (get rid of) the air and reduce the bloated feeling in your abdomen.   No driving for 24 hours (because of the anesthesia (medicine) used during the test).   You may shower.   Do not sign any important legal documents or operate any machinery for 24 hours (because of the anesthesia used during the test).  NUTRITION  Drink plenty of fluids.   You may resume your normal diet.   Begin with a light meal and progress to your normal diet.   Avoid alcoholic beverages for 24 hours or as instructed by your caregiver.  MEDICATIONS  You may resume your normal medications unless your caregiver tells you otherwise.  WHAT YOU CAN EXPECT TODAY  You may experience abdominal discomfort such as a feeling of fullness or gas pains.  FOLLOW-UP  Your doctor will discuss the results of your test with you.  SEEK IMMEDIATE MEDICAL ATTENTION IF ANY OF THE FOLLOWING OCCUR:  Excessive nausea (feeling sick to your stomach) and/or vomiting.   Severe abdominal pain and distention (swelling).   Trouble swallowing.   Temperature over 101 F (37.8 C).   Rectal bleeding or vomiting of blood.    Check with your primary care physician regarding the accuracy of you current medication list  Increase Protonix to 40 mg twice  daily  No aspirin/NSAIDs like BC powders Aleve Advil or ibuprofen  Your doctor should change Fosamax from the oral form to the shot form.  Fosamax can be damaging to your esophagus.  Office visit with [place] in 3 months  Further recommendations to follow pending review of pathology report  I called patient's husband Tasha Clarke at [phone] but could not reach him.     Monitored Anesthesia Care, Care After These instructions provide you with information about caring for yourself after your procedure. Your health care provider may also give you more specific instructions. Your treatment has been planned according to current medical practices, but problems sometimes occur. Call your health care provider if you have any problems or questions after your procedure. What can I expect after the procedure? After your procedure, you may:  Feel sleepy for several hours.  Feel clumsy and have poor balance for several hours.  Feel forgetful about what happened after the procedure.  Have poor judgment for several hours.  Feel nauseous or vomit.  Have a sore throat if you had a breathing tube during the procedure. Follow these instructions at home: For at least 24 hours after the procedure:      Have a responsible adult stay with you. It is important to have someone help care for you until you are awake and alert.  Rest as needed.  Do not: ? Participate in activities in which you could fall or become injured. ? Drive. ? Use heavy machinery. ? Drink alcohol. ? Take sleeping pills or medicines  that cause drowsiness. ? Make important decisions or sign legal documents. ? Take care of children on your own. Eating and drinking  Follow the diet that is recommended by your health care provider.  If you vomit, drink water, juice, or soup when you can drink without vomiting.  Make sure you have little or no nausea before eating solid foods. General instructions  Take  over-the-counter and prescription medicines only as told by your health care provider.  If you have sleep apnea, surgery and certain medicines can increase your risk for breathing problems. Follow instructions from your health care provider about wearing your sleep device: ? Anytime you are sleeping, including during daytime naps. ? While taking prescription pain medicines, sleeping medicines, or medicines that make you drowsy.  If you smoke, do not smoke without supervision.  Keep all follow-up visits as told by your health care provider. This is important. Contact a health care provider if:  You keep feeling nauseous or you keep vomiting.  You feel light-headed.  You develop a rash.  You have a fever. Get help right away if:  You have trouble breathing. Summary  For several hours after your procedure, you may feel sleepy and have poor judgment.  Have a responsible adult stay with you for at least 24 hours or until you are awake and alert. This information is not intended to replace advice given to you by your health care provider. Make sure you discuss any questions you have with your health care provider. Document Released: 10/19/2015 Document Revised: 09/26/2017 Document Reviewed: 10/19/2015 Elsevier Patient Education  2020 [employer].

## 2019-02-12 NOTE — Op Note (Signed)
[facility] Patient Name: Tasha Clarke Procedure Date: 02/12/2019 1:33 PM MRN: [id number] Date of Birth: [birth year] Attending MD: Norvel Richards , MD CSN: [id number] Age: 68 Admit Type: Outpatient Procedure:                Upper GI endoscopy Indications:              Dysphagia Providers:                Norvel Richards, MD, Jeanann Lewandowsky. Sharon Seller, RN,                            Nelma Rothman, Technician Referring MD:              Medicines:                Propofol per Anesthesia Complications:            No immediate complications. Estimated Blood Loss:     Estimated blood loss was minimal. Procedure:                Pre-Anesthesia Assessment:                           - Prior to the procedure, a History and Physical                            was performed, and patient medications and                            allergies were reviewed. The patient's tolerance of                            previous anesthesia was also reviewed. The risks                            and benefits of the procedure and the sedation                            options and risks were discussed with the patient.                            All questions were answered, and informed consent                            was obtained. Prior Anticoagulants: The patient has                            taken no previous anticoagulant or antiplatelet                            agents. ASA Grade Assessment: III - A patient with                            severe systemic disease. After reviewing the risks  and benefits, the patient was deemed in                            satisfactory condition to undergo the procedure.                           After obtaining informed consent, the endoscope was                            passed under direct vision. Throughout the                            procedure, the patient's blood pressure, pulse, and                            oxygen saturations  were monitored continuously. The                            GIF-H190 ([id number]) scope was introduced through the                            mouth, and advanced to the second part of duodenum.                            The upper GI endoscopy was accomplished without                            difficulty. The patient tolerated the procedure                            well. Scope In: 1:37:58 PM Scope Out: 1:50:23 PM Total Procedure Duration: 0 hours 12 minutes 25 seconds  Findings:      Esophagitis was found. Circumferential distal esophageal erosions within       10 mm of the GE junction. No Barrett's epithelium. Noncritical appearing       Schatzki's ring present. The scope was withdrawn. Dilation was performed       with a Maloney dilator with mild resistance at 20 Fr.      A small hiatal hernia was present. Dilated stomach with gastric       congestion. Deformity of the antrum. Patient was found to have a 1 cm       deep pyloric channel ulcer with a clean base. Pylorus remain patent the       lumen somewhat compromised by deformity and edema.      The duodenal bulb and second portion of the duodenum were normal. The       scope was withdrawn. Dilation was performed with a Maloney dilator with       mild resistance at 56 Fr. The dilation site was examined following       endoscope reinsertion and showed moderate mucosal disruption. Estimated       blood loss was minimal. Finally, the pyloric channel ulcer was biopsied. Impression:               -Erosive reflux esophagitis. Critical appearing  Schatzki's ring status post dilation                           - Small hiatal hernia. Somewhat dilated stomach.                            Deep pyloric channel ulcer?"status post biopsy                           - Normal duodenal bulb and second portion of the                            duodenum.                           - No specimens collected. Moderate Sedation:       Moderate (conscious) sedation was personally administered by an       anesthesia professional. The following parameters were monitored: oxygen       saturation, heart rate, blood pressure, respiratory rate, EKG, adequacy       of pulmonary ventilation, and response to care. Recommendation:           - Patient has a contact number available for                            emergencies. The signs and symptoms of potential                            delayed complications were discussed with the                            patient. Return to normal activities tomorrow.                            Written discharge instructions were provided to the                            patient.                           - Advance diet as tolerated today.                           - Continue present medications. Increase Protonix                            to 40 mg twice daily. Avoid all aspirin/NSAID                            products. See Dr. Warrick Parisian regarding changing oral                            Fosamax to a parenteral bisphosphonate                           - Repeat upper endoscopy in 3  months for                            surveillance.                           - Return to GI clinic in 3 months. Procedure Code(s):        --- Professional ---                           [phone], Esophagogastroduodenoscopy, flexible,                            transoral; diagnostic, including collection of                            specimen(s) by brushing or washing, when performed                            (separate procedure)                           43450, Dilation of esophagus, by unguided sound or                            bougie, single or multiple passes Diagnosis Code(s):        --- Professional ---                           K20.9, Esophagitis, unspecified                           K44.9, Diaphragmatic hernia without obstruction or                            gangrene                           R13.10,  Dysphagia, unspecified CPT copyright 2019 American Medical Association. All rights reserved. The codes documented in this report are preliminary and upon coder review may  be revised to meet current compliance requirements. [name]. Jahnai Slingerland, MD Norvel Richards, MD 02/12/2019 2:09:20 PM This report has been signed electronically. Number of Addenda: 0

## 2019-02-12 NOTE — Anesthesia Preprocedure Evaluation (Signed)
Anesthesia Evaluation  Patient identified by MRN, date of birth, ID band Patient awake    Airway Mallampati: II  TM Distance: >3 FB Neck ROM: Full    Dental  (+) Edentulous Upper, Edentulous Lower,    Pulmonary asthma , sleep apnea and Oxygen sleep apnea , pneumonia, COPD,  COPD inhaler and oxygen dependent, Current Smoker,   Mild wheezing, will give duoneb treatment   + wheezing      Cardiovascular Exercise Tolerance: Poor < 3 Mets hypertension, Pt. on medications +CHF   Rhythm:Regular Rate:Normal  Study Conclusions  - Left ventricle: The cavity size was normal. Wall thickness was   increased in a pattern of mild LVH. Systolic function was normal.   The estimated ejection fraction was in the range of 60% to 65%.   Wall motion was normal; there were no regional wall motion   abnormalities. Doppler parameters are consistent with abnormal   left ventricular relaxation (grade 1 diastolic dysfunction). - Aortic valve: Mildly thickened, mildly calcified leaflets.   Sclerosis without stenosis. - Mitral valve: Calcified annulus.    Neuro/Psych Seizures -,  PSYCHIATRIC DISORDERS Anxiety Depression  Neuromuscular disease    GI/Hepatic GERD  ,  Endo/Other  diabetesHypothyroidism Morbid obesity  Renal/GU Renal disease     Musculoskeletal  (+) Arthritis ,   Abdominal   Peds  Hematology  (+) anemia ,   Anesthesia Other Findings   Reproductive/Obstetrics                             Anesthesia Physical Anesthesia Plan  ASA: IV  Anesthesia Plan: General   Post-op Pain Management:    Induction:   PONV Risk Score and Plan: TIVA  Airway Management Planned:   Additional Equipment:   Intra-op Plan:   Post-operative Plan: Possible Post-op intubation/ventilation  Informed Consent: I have reviewed the patients History and Physical, chart, labs and discussed the procedure including the risks,  benefits and alternatives for the proposed anesthesia with the patient or authorized representative who has indicated his/her understanding and acceptance.       Plan Discussed with: CRNA  Anesthesia Plan Comments:         Anesthesia Quick Evaluation

## 2019-02-12 NOTE — Transfer of Care (Signed)
Immediate Anesthesia Transfer of Care Note  Patient: Tasha Clarke  Procedure(s) Performed: ESOPHAGOGASTRODUODENOSCOPY (EGD) WITH PROPOFOL (N/A ) MALONEY DILATION (N/A ) BIOPSY  Patient Location: PACU  Anesthesia Type:General  Level of Consciousness: awake, alert , oriented and patient cooperative  Airway & Oxygen Therapy: Patient Spontanous Breathing and Patient connected to nasal cannula oxygen  Post-op Assessment: Report given to RN and Post -op Vital signs reviewed and stable  Post vital signs: Reviewed and stable  Last Vitals:  Vitals Value Taken Time  BP    Temp    Pulse 63 02/12/19 1357  Resp 16 02/12/19 1357  SpO2 95 % 02/12/19 1357  Vitals shown include unvalidated device data.  Last Pain:  Vitals:   02/12/19 1332  TempSrc:   PainSc: 10-Worst pain ever      Patients Stated Pain Goal: 9 (13/68/59 9234)  Complications: No apparent anesthesia complications

## 2019-02-12 NOTE — H&P (Signed)
@LOGO @   Primary Care Physician:  Dettinger, Fransisca Kaufmann, MD Primary Gastroenterologist:  Dr. Gala Romney  Pre-Procedure History & Physical: HPI:  Tasha Clarke is a 68 y.o. female here for further evaluation of esophageal dysphagia.  Esophageal dilation 5 to 6 years ago elsewhere.  Esophagus appeared normal last year.  Daily reflux in spite of taking Protonix 40 mg daily.  Takes oral Fosamax as well.  Past Medical History:  Diagnosis Date  . Allergy   . Anemia   . Anxiety   . Arthritis   . Asthma   . Blood transfusion without reported diagnosis   . Cataract   . CHF (congestive heart failure) ([place])   . Chronic kidney disease   . Clotting disorder ([place])   . COPD (chronic obstructive pulmonary disease) ([place])   . Depression   . Diabetes mellitus without complication ([place])    pt states she is not diabetic   . Emphysema of lung ([place])   . GERD (gastroesophageal reflux disease)   . Hyperlipidemia   . Hypertension   . Neuromuscular disorder ([place])   . Osteoporosis   . Seizures ([place])   . Skin cancer    2 areas removed more than 10 years ago  . Sleep apnea 1985   wears cpap nightly  . Thyroid disease     Past Surgical History:  Procedure Laterality Date  . APPENDECTOMY    . BIOPSY  03/16/2018   Procedure: BIOPSY;  Surgeon: Daneil Dolin, MD;  Location: [facility];  Service: Endoscopy;;  gastric bx's  . CAROTID ENDARTERECTOMY Bilateral   . CERVICAL SPINE SURGERY     after neck fracture  . ESOPHAGOGASTRODUODENOSCOPY (EGD) WITH PROPOFOL N/A 03/16/2018   Procedure: ESOPHAGOGASTRODUODENOSCOPY (EGD) WITH PROPOFOL;  Surgeon: Daneil Dolin, MD;  Location: [facility];  Service: Endoscopy;  Laterality: N/A;  3:00pm  . EYE SURGERY    . FRACTURE SURGERY    . heart stents    . JOINT REPLACEMENT     Right knee  . SPINAL CORD STIMULATOR IMPLANT  2015  . SPINE SURGERY     lumbar x4     Prior to Admission medications   Medication Sig Start Date End Date Taking? Authorizing  Provider  alendronate (FOSAMAX) 70 MG tablet Take 1 tablet (70 mg total) by mouth every Monday. Take with a full glass of water on an empty stomach. 05/22/18  Yes Dettinger, Fransisca Kaufmann, MD  aspirin EC 81 MG tablet Take 81 mg by mouth daily.   Yes [provider]  buprenorphine (BUTRANS) 10 MCG/HR PTWK patch Place 10 mcg onto the skin once a week.   Yes Peter Garter., MD  Buprenorphine 15 MCG/HR PTWK APPLY 1 PATCH TO SKIN ONCE A WEEK FOR 28 DAYS 12/15/18  Yes [provider]  butalbital-acetaminophen-caffeine (FIORICET, ESGIC) 50-325-40 MG tablet Take by mouth 2 (two) times daily as needed for headache or migraine.   Yes Peter Garter., MD  Cholecalciferol (VITAMIN D-3) 5000 units TABS Take 5,000 Units by mouth daily.   Yes [provider]  Cyanocobalamin (VITAMIN B-12 PO) Take by mouth daily.   Yes [provider]  cyanocobalamin 100 MCG tablet Take 100 mcg by mouth.   Yes [provider]  cyclobenzaprine (FLEXERIL) 10 MG tablet TAKE 1 TABLET BY MOUTH TWICE DAILY AS NEEDED FOR MUSCLE SPASM 11/22/18  Yes Dettinger, Fransisca Kaufmann, MD  dicyclomine (BENTYL) 10 MG capsule Take 1 capsule (10 mg total) by mouth 4 (four)  times daily as needed (for diarrhea). 12/20/18  Yes Carlis Stable, NP  diphenhydramine-acetaminophen (TYLENOL PM EXTRA STRENGTH) 25-500 MG TABS tablet Take 1 tablet by mouth at bedtime as needed (pain).    Yes [provider]  DULoxetine (CYMBALTA) 30 MG capsule Take 3 capsules (90 mg total) by mouth daily. 01/31/19  Yes Dettinger, Fransisca Kaufmann, MD  EPINEPHrine 0.3 mg/0.3 mL IJ SOAJ injection Inject 0.3 mLs (0.3 mg total) into the muscle as needed. 01/31/19  Yes Dettinger, Fransisca Kaufmann, MD  EQ ALLERGY RELIEF, CETIRIZINE, 10 MG tablet Take 1 tablet by mouth once daily 11/22/18  Yes Dettinger, Fransisca Kaufmann, MD  Ferrous Sulfate (IRON) 325 (65 Fe) MG TABS Take 1 tablet (325 mg total) by mouth daily. 08/27/17  Yes Velvet Bathe, MD  fluticasone (FLONASE) 50  MCG/ACT nasal spray Place 2 sprays into both nostrils daily. 05/17/18  Yes Dettinger, Fransisca Kaufmann, MD  Fluticasone-Umeclidin-Vilant (TRELEGY ELLIPTA) 100-62.5-25 MCG/INH AEPB Inhale into the lungs 1 day or 1 dose.   Yes [provider]  folic acid (FOLVITE) 1 MG tablet Take 1 mg by mouth daily.   Yes [provider]  gabapentin (NEURONTIN) 600 MG tablet Take 1 tablet (600 mg total) by mouth 4 (four) times daily. 07/26/18  Yes Dettinger, Fransisca Kaufmann, MD  ibuprofen (ADVIL,MOTRIN) 200 MG tablet Take 200 mg by mouth every 6 (six) hours as needed.   Yes [provider]  ipratropium-albuterol (DUONEB) 0.5-2.5 (3) MG/3ML SOLN Take 3 mLs by nebulization every 6 (six) hours as needed (shortness of breathe).    Yes [provider]  levETIRAcetam (KEPPRA) 1000 MG tablet Take 1 tablet (1,000 mg total) by mouth at bedtime. 01/31/19  Yes Dettinger, Fransisca Kaufmann, MD  levETIRAcetam (KEPPRA) 750 MG tablet Take 1 tablet (750 mg total) by mouth every morning. 01/31/19  Yes Dettinger, Fransisca Kaufmann, MD  levothyroxine (SYNTHROID) 50 MCG tablet Take 1 tablet (50 mcg total) by mouth daily before breakfast. 01/31/19  Yes Dettinger, Fransisca Kaufmann, MD  Magnesium 400 MG CAPS Take 400 mg by mouth daily.   Yes [provider]  Melatonin 10 MG TABS Take by mouth.   Yes [provider]  Multiple Vitamin (MULTI-VITAMIN) tablet Take by mouth.   Yes [provider]  Multiple Vitamin (MULTIVITAMIN) tablet Take 1 tablet by mouth daily.   Yes [provider]  Omega-3 Fatty Acids (FISH OIL) 1000 MG CAPS Take 1,000 mg by mouth daily.   Yes [provider]  ondansetron (ZOFRAN) 4 MG tablet TAKE 1 TABLET BY MOUTH 4 TIMES DAILY AS NEEDED 12/20/18  Yes Walden Field A, NP  potassium chloride (K-DUR) 10 MEQ tablet Take 1 tablet by mouth daily.   Yes [provider]  QUEtiapine (SEROQUEL) 50 MG tablet Take 1 tablet (50 mg total) by mouth at bedtime. 01/31/19  Yes Dettinger, Fransisca Kaufmann,  MD  albuterol (PROVENTIL HFA) 108 (90 Base) MCG/ACT inhaler Inhale into the lungs.    [provider]  metoprolol tartrate (LOPRESSOR) 25 MG tablet Take 12.5 mg by mouth 2 (two) times daily.    [provider]  naloxone [facility]) nasal spray 4 mg/0.1 mL Place into the nose. 08/18/17   [provider]  rosuvastatin (CRESTOR) 40 MG tablet Take 1 tablet (40 mg total) by mouth daily. 08/28/18 12/20/18  Minus Breeding, MD    Allergies as of 12/20/2018 - Review Complete 12/20/2018  Allergen Reaction Noted  . Cephalexin Hives 01/24/2013  . Ketorolac tromethamine Hives 07/26/2017  .  Lac bovis Nausea And Vomiting 05/28/2015  . Imdur  [isosorbide dinitrate]    . Iodinated diagnostic agents  05/28/2015  . Other Other (See Comments) 01/24/2013  . Sodium bicarbonate-citric acid  08/24/2017  . Milk-related compounds Nausea And Vomiting 05/28/2015    Family History  Adopted: Yes  Problem Relation Age of Onset  . Arthritis Mother   . Asthma Mother   . Cancer Mother        lung  . Depression Mother   . Diabetes Mother   . Arthritis Father   . Asthma Father   . Depression Father   . Asthma Sister   . Depression Sister   . Diabetes Sister   . Arthritis Maternal Grandmother   . Asthma Maternal Grandmother   . Depression Maternal Grandmother   . Arthritis Maternal Grandfather   . Asthma Maternal Grandfather   . Arthritis Paternal Grandmother   . Asthma Paternal Grandmother   . Arthritis Paternal Grandfather   . Asthma Paternal Grandfather   . COPD Paternal Grandfather   . Diabetes Sister   . Mental illness Sister   . Anemia Sister   . Hypertension Son   . Post-traumatic stress disorder Son   . Allergies Daughter   . Breast cancer Neg Hx   . Seizures Neg Hx        not that she knows of, pt adopted    Social History   Socioeconomic History  . Marital status: Married    Spouse name: Not on file  . Number of children: 2  . Years of education: 57  . Highest  education level: Some college, no degree  Occupational History  . Occupation: Retired  Scientific laboratory technician  . Financial resource strain: Not hard at all  . Food insecurity    Worry: Never true    Inability: Never true  . Transportation needs    Medical: No    Non-medical: No  Tobacco Use  . Smoking status: Current Every Day Smoker    Packs/day: 1.00    Years: 43.00    Pack years: 43.00    Types: Cigarettes  . Smokeless tobacco: Never Used  Substance and Sexual Activity  . Alcohol use: Yes    Frequency: Never    Comment: occasionally, not even 1 drink per month   . Drug use: No  . Sexual activity: Not Currently    Birth control/protection: Post-menopausal  Lifestyle  . Physical activity    Days per week: 0 days    Minutes per session: 0 min  . Stress: To some extent  Relationships  . Social connections    Talks on phone: More than three times a week    Gets together: Three times a week    Attends religious service: Never    Active member of club or organization: No    Attends meetings of clubs or organizations: Never    Relationship status: Married  . Intimate partner violence    Fear of current or ex partner: No    Emotionally abused: No    Physically abused: No    Forced sexual activity: No  Other Topics Concern  . Not on file  Social History Narrative   Lives at home with husband and son   Right hand dominant   Caffeine: 4-5 cups daily (coffee & mtn dew)    Review of Systems: See HPI, otherwise negative ROS  Physical Exam: BP (!) 163/66   Temp 98.1 F (36.7 C) (Oral)   Resp  18   SpO2 97%  General:   Alert,  Well-developed, well-nourished, pleasant and cooperative in NAD Mouth:  No deformity or lesions. Neck:  Supple; no masses or thyromegaly. No significant cervical adenopathy. Lungs:  Clear throughout to auscultation.   No wheezes, crackles, or rhonchi. No acute distress. Heart:  Regular rate and rhythm; no murmurs, clicks, rubs,  or gallops. Abdomen:  Non-distended, normal bowel sounds.  Soft and nontender without appreciable mass or hepatosplenomegaly.  Pulses:  Normal pulses noted. Extremities:  Without clubbing or edema.  Impression/Plan: 68 year old lady with recurrent esophageal dysphagia.  Breakthrough reflux symptoms on daily PPI next oral bisphosphonate therapy. I have offered the patient a EGD with esophageal dilation as feasible/appropriate per plan.  The risks, benefits, limitations, alternatives and imponderables have been reviewed with the patient. Potential for esophageal dilation, biopsy, etc. have also been reviewed.  Questions have been answered. All parties agreeable.     Notice: This dictation was prepared with Dragon dictation along with smaller phrase technology. Any transcriptional errors that result from this process are unintentional and may not be corrected upon review.

## 2019-02-12 NOTE — Anesthesia Postprocedure Evaluation (Signed)
Anesthesia Post Note  Patient: Tasha Clarke  Procedure(s) Performed: ESOPHAGOGASTRODUODENOSCOPY (EGD) WITH PROPOFOL (N/A ) MALONEY DILATION (N/A ) BIOPSY  Patient location during evaluation: PACU Anesthesia Type: General Level of consciousness: awake and alert and oriented Pain management: pain level controlled Vital Signs Assessment: post-procedure vital signs reviewed and stable Respiratory status: spontaneous breathing Cardiovascular status: stable Postop Assessment: no apparent nausea or vomiting Anesthetic complications: no     Last Vitals:  Vitals:   02/12/19 1238  BP: (!) 163/66  Resp: 18  Temp: 36.7 C  SpO2: 97%    Last Pain:  Vitals:   02/12/19 1332  TempSrc:   PainSc: 10-Worst pain ever                 Dariah Mcsorley A

## 2019-02-12 NOTE — Anesthesia Procedure Notes (Signed)
Procedure Name: General with mask airway Performed by: Andree Elk, Amy A, CRNA Pre-anesthesia Checklist: Timeout performed, Patient being monitored, Suction available, Emergency Drugs available and Patient identified Oxygen Delivery Method: Non-rebreather mask

## 2019-02-14 ENCOUNTER — Other Ambulatory Visit: Payer: Self-pay | Admitting: Family Medicine

## 2019-02-14 ENCOUNTER — Encounter: Payer: Self-pay | Admitting: Internal Medicine

## 2019-02-14 NOTE — Progress Notes (Signed)
Pt sleeping. Talked with husband. Pt doing well. States that he stopped on the way home and got her a walker and she is doing so much better.

## 2019-02-15 DIAGNOSIS — M47812 Spondylosis without myelopathy or radiculopathy, cervical region: Secondary | ICD-10-CM | POA: Diagnosis not present

## 2019-02-15 DIAGNOSIS — Z9689 Presence of other specified functional implants: Secondary | ICD-10-CM | POA: Diagnosis not present

## 2019-02-15 DIAGNOSIS — G894 Chronic pain syndrome: Secondary | ICD-10-CM | POA: Diagnosis not present

## 2019-02-15 DIAGNOSIS — M961 Postlaminectomy syndrome, not elsewhere classified: Secondary | ICD-10-CM | POA: Diagnosis not present

## 2019-02-15 MED ORDER — CYCLOBENZAPRINE HCL 10 MG PO TABS: 10.0000 mg | ORAL_TABLET | Freq: Every day | ORAL | 0 refills | Status: DC

## 2019-02-23 ENCOUNTER — Encounter (HOSPITAL_COMMUNITY): Payer: Self-pay | Admitting: Internal Medicine

## 2019-02-23 DIAGNOSIS — J449 Chronic obstructive pulmonary disease, unspecified: Secondary | ICD-10-CM | POA: Diagnosis not present

## 2019-03-01 ENCOUNTER — Ambulatory Visit: Payer: Medicare PPO | Admitting: Nurse Practitioner

## 2019-03-04 ENCOUNTER — Other Ambulatory Visit: Payer: Self-pay

## 2019-03-05 MED ORDER — CYCLOBENZAPRINE HCL 10 MG PO TABS: 10.0000 mg | ORAL_TABLET | Freq: Every day | ORAL | 0 refills | Status: DC

## 2019-03-07 DIAGNOSIS — Z9689 Presence of other specified functional implants: Secondary | ICD-10-CM | POA: Diagnosis not present

## 2019-03-07 DIAGNOSIS — M961 Postlaminectomy syndrome, not elsewhere classified: Secondary | ICD-10-CM | POA: Diagnosis not present

## 2019-03-07 DIAGNOSIS — M533 Sacrococcygeal disorders, not elsewhere classified: Secondary | ICD-10-CM | POA: Diagnosis not present

## 2019-03-07 DIAGNOSIS — G894 Chronic pain syndrome: Secondary | ICD-10-CM | POA: Diagnosis not present

## 2019-03-07 DIAGNOSIS — M47812 Spondylosis without myelopathy or radiculopathy, cervical region: Secondary | ICD-10-CM | POA: Diagnosis not present

## 2019-03-14 ENCOUNTER — Other Ambulatory Visit: Payer: Self-pay

## 2019-03-15 ENCOUNTER — Encounter: Payer: Self-pay | Admitting: Family Medicine

## 2019-03-15 ENCOUNTER — Ambulatory Visit (INDEPENDENT_AMBULATORY_CARE_PROVIDER_SITE_OTHER): Payer: Medicare PPO | Admitting: Family Medicine

## 2019-03-15 VITALS — BP 121/76 | HR 70 | Temp 98.0°F

## 2019-03-15 DIAGNOSIS — F419 Anxiety disorder, unspecified: Secondary | ICD-10-CM

## 2019-03-15 DIAGNOSIS — F339 Major depressive disorder, recurrent, unspecified: Secondary | ICD-10-CM

## 2019-03-15 DIAGNOSIS — Z23 Encounter for immunization: Secondary | ICD-10-CM | POA: Diagnosis not present

## 2019-03-15 MED ORDER — QUETIAPINE FUMARATE 100 MG PO TABS: 100.0000 mg | ORAL_TABLET | Freq: Every day | ORAL | 3 refills | Status: DC

## 2019-03-15 MED ORDER — IPRATROPIUM-ALBUTEROL 0.5-2.5 (3) MG/3ML IN SOLN: 3.0000 mL | Freq: Four times a day (QID) | RESPIRATORY_TRACT | 1 refills | Status: DC | PRN

## 2019-03-15 NOTE — Progress Notes (Signed)
BP 121/76   Pulse 70   Temp 98 F (36.7 C)    Subjective:   Patient ID: Tasha Clarke, female    DOB: [birth year], 68 y.o.   MRN: [id number]  HPI: Tasha Clarke is a 68 y.o. female presenting on 03/15/2019 for Depression (follow up- Patient was seen 7/22 and states it is no better.) and Anxiety   HPI Depression and anxiety Is coming in today for recheck of depression anxiety persists on doing very well on her depression anxiety medication.  She is currently taking Seroquel 50 and Cymbalta 90 mg.  A lot of her issues are coming from her stress and anxiety that she is had long-term but also from the fact that her husband who usually helps care take for her is dealing with some major health issues and self. Depression screen [facility] 2/9 03/15/2019 12/11/2018 07/26/2018 05/24/2018 05/17/2018  Decreased Interest 2 0 '1 2 2  ' Down, Depressed, Hopeless '2 2 1 2 2  ' PHQ - 2 Score '4 2 2 4 4  ' Altered sleeping '3 2 2 3 3  ' Tired, decreased energy '3 2 2 3 3  ' Change in appetite '3 2 2 3 3  ' Feeling bad or failure about yourself  '3 1 1 2 2  ' Trouble concentrating 3 0 '1 1 1  ' Moving slowly or fidgety/restless 0 0 1 - 1  Suicidal thoughts 0 0 0 0 0  PHQ-9 Score '19 9 11 16 17  ' Difficult doing work/chores Not difficult at all Not difficult at all - - -  Some recent data might be hidden    Patient has lower blood pressure on first exam and she has a little dizziness today but does not have a consistently, likely something she needs to discuss with her cardiologist because her blood pressure has been running low for some time.  Relevant past medical, surgical, family and social history reviewed and updated as indicated. Interim medical history since our last visit reviewed. Allergies and medications reviewed and updated.  Review of Systems  Constitutional: Negative for chills and fever.  Eyes: Negative for redness and visual disturbance.  Respiratory: Positive for cough and wheezing (Chronic, no  worse or no better.). Negative for chest tightness and shortness of breath.   Cardiovascular: Negative for chest pain and leg swelling.  Musculoskeletal: Negative for back pain and gait problem.  Skin: Negative for rash.  Neurological: Positive for dizziness and light-headedness. Negative for headaches.  Psychiatric/Behavioral: Negative for agitation and behavioral problems.  All other systems reviewed and are negative.   Per HPI unless specifically indicated above   Allergies as of 03/15/2019      Reactions   Cephalexin Hives   Ketorolac Tromethamine Hives   Lac Bovis Nausea And Vomiting   Imdur  [isosorbide Dinitrate]    Iodinated Diagnostic Agents    Other Other (See Comments)   Alka seltzer causes blurred vision, fainting   Sodium Bicarbonate-citric Acid    Milk-related Compounds Nausea And Vomiting      Medication List       Accurate as of March 15, 2019  8:39 AM. If you have any questions, ask your nurse or doctor.        STOP taking these medications   aspirin EC 81 MG tablet Stopped by: Worthy Rancher, MD   ibuprofen 200 MG tablet Commonly known as: ADVIL Stopped by: Fransisca Kaufmann Dettinger, MD     TAKE these medications   alendronate 70 MG tablet Commonly  known as: FOSAMAX Take 1 tablet (70 mg total) by mouth every Monday. Take with a full glass of water on an empty stomach.   buprenorphine 20 MCG/HR Ptwk patch Commonly known as: [place] onto the skin. What changed: Another medication with the same name was removed. Continue taking this medication, and follow the directions you see here. Changed by: Worthy Rancher, MD   butalbital-acetaminophen-caffeine [phone] MG tablet Commonly known as: FIORICET Take by mouth 2 (two) times daily as needed for headache or migraine.   cyclobenzaprine 10 MG tablet Commonly known as: FLEXERIL Take 1 tablet (10 mg total) by mouth at bedtime.   dicyclomine 10 MG capsule Commonly known as: BENTYL Take 1  capsule (10 mg total) by mouth 4 (four) times daily as needed (for diarrhea).   DULoxetine 30 MG capsule Commonly known as: CYMBALTA Take 3 capsules (90 mg total) by mouth daily.   EPINEPHrine 0.3 mg/0.3 mL Soaj injection Commonly known as: EPI-PEN Inject 0.3 mLs (0.3 mg total) into the muscle as needed.   EQ Allergy Relief (Cetirizine) 10 MG tablet Generic drug: cetirizine Take 1 tablet by mouth once daily   Fish Oil 1000 MG Caps Take 1,000 mg by mouth daily.   fluticasone 50 MCG/ACT nasal spray Commonly known as: Flonase Place 2 sprays into both nostrils daily.   folic acid 1 MG tablet Commonly known as: FOLVITE Take 1 mg by mouth daily.   gabapentin 600 MG tablet Commonly known as: NEURONTIN Take 1 tablet (600 mg total) by mouth 4 (four) times daily.   ipratropium-albuterol 0.5-2.5 (3) MG/3ML Soln Commonly known as: DUONEB Take 3 mLs by nebulization every 6 (six) hours as needed (shortness of breathe).   Iron 325 (65 Fe) MG Tabs Take 1 tablet (325 mg total) by mouth daily.   levETIRAcetam 1000 MG tablet Commonly known as: KEPPRA Take 1 tablet (1,000 mg total) by mouth at bedtime.   levETIRAcetam 750 MG tablet Commonly known as: KEPPRA Take 1 tablet (750 mg total) by mouth every morning.   levothyroxine 50 MCG tablet Commonly known as: SYNTHROID Take 1 tablet (50 mcg total) by mouth daily before breakfast.   Magnesium 400 MG Caps Take 400 mg by mouth daily.   Melatonin 10 MG Tabs Take by mouth.   metoprolol tartrate 25 MG tablet Commonly known as: LOPRESSOR Take 12.5 mg by mouth 2 (two) times daily.   multivitamin tablet Take 1 tablet by mouth daily.   Multi-Vitamin tablet Take by mouth.   Narcan 4 MG/0.1ML Liqd nasal spray kit Generic drug: naloxone Place into the nose.   ondansetron 4 MG tablet Commonly known as: ZOFRAN TAKE 1 TABLET BY MOUTH 4 TIMES DAILY AS NEEDED   potassium chloride 10 MEQ tablet Commonly known as: K-DUR Take 1 tablet  by mouth daily.   Proventil HFA 108 (90 Base) MCG/ACT inhaler Generic drug: albuterol Inhale into the lungs.   QUEtiapine 50 MG tablet Commonly known as: SEROquel Take 1 tablet (50 mg total) by mouth at bedtime.   rosuvastatin 40 MG tablet Commonly known as: CRESTOR Take 1 tablet (40 mg total) by mouth daily.   Trelegy Ellipta 100-62.5-25 MCG/INH Aepb Generic drug: Fluticasone-Umeclidin-Vilant Inhale into the lungs 1 day or 1 dose.   Tylenol PM Extra Strength 25-500 MG Tabs tablet Generic drug: diphenhydramine-acetaminophen Take 1 tablet by mouth at bedtime as needed (pain).   VITAMIN B-12 PO Take by mouth daily.   cyanocobalamin 100 MCG tablet Take 100 mcg by mouth.  Vitamin D-3 125 MCG (5000 UT) Tabs Take 5,000 Units by mouth daily.        Objective:   BP 121/76   Pulse 70   Temp 98 F (36.7 C)   Wt Readings from Last 3 Encounters:  09/21/18 225 lb (102.1 kg)  08/28/18 225 lb (102.1 kg)  07/31/18 220 lb (99.8 kg)    Physical Exam Vitals signs and nursing note reviewed.  Constitutional:      General: She is not in acute distress.    Appearance: She is well-developed. She is not diaphoretic.  Eyes:     Conjunctiva/sclera: Conjunctivae normal.  Cardiovascular:     Rate and Rhythm: Normal rate and regular rhythm.     Heart sounds: Normal heart sounds. No murmur.  Pulmonary:     Effort: Pulmonary effort is normal. No respiratory distress.     Breath sounds: Wheezing (end expiratory wheeze in upper lobes bilaterally.) present. No rhonchi.  Musculoskeletal: Normal range of motion.        General: No tenderness.  Skin:    General: Skin is warm and dry.     Findings: No rash.  Neurological:     Mental Status: She is alert and oriented to person, place, and time.     Coordination: Coordination normal.  Psychiatric:        Mood and Affect: Mood is anxious and depressed.        Behavior: Behavior normal.        Thought Content: Thought content does not  include suicidal ideation. Thought content does not include suicidal plan.       Assessment & Plan:   Problem List Items Addressed This Visit      Other   Depression, recurrent ([place]) - Primary   Relevant Medications   QUEtiapine (SEROQUEL) 100 MG tablet   Anxiety   Relevant Medications   QUEtiapine (SEROQUEL) 100 MG tablet      Will increase Seroquel to help more with anxiety. Follow up plan: Return in about 4 weeks (around 04/12/2019), or if symptoms worsen or fail to improve, for Depression and anxiety.  Counseling provided for all of the vaccine components No orders of the defined types were placed in this encounter.   Caryl Pina, MD [facility] 03/15/2019, 8:39 AM

## 2019-03-15 NOTE — Addendum Note (Signed)
Addended by: Thana Ates on: 03/15/2019 09:50 AM   Modules accepted: Orders

## 2019-03-26 DIAGNOSIS — J449 Chronic obstructive pulmonary disease, unspecified: Secondary | ICD-10-CM | POA: Diagnosis not present

## 2019-04-05 DIAGNOSIS — F1120 Opioid dependence, uncomplicated: Secondary | ICD-10-CM | POA: Diagnosis not present

## 2019-04-05 DIAGNOSIS — M5416 Radiculopathy, lumbar region: Secondary | ICD-10-CM | POA: Diagnosis not present

## 2019-04-05 DIAGNOSIS — M961 Postlaminectomy syndrome, not elsewhere classified: Secondary | ICD-10-CM | POA: Diagnosis not present

## 2019-04-16 ENCOUNTER — Ambulatory Visit (INDEPENDENT_AMBULATORY_CARE_PROVIDER_SITE_OTHER): Payer: Medicare PPO | Admitting: Family Medicine

## 2019-04-16 ENCOUNTER — Encounter: Payer: Self-pay | Admitting: Family Medicine

## 2019-04-16 DIAGNOSIS — F419 Anxiety disorder, unspecified: Secondary | ICD-10-CM

## 2019-04-16 DIAGNOSIS — F339 Major depressive disorder, recurrent, unspecified: Secondary | ICD-10-CM | POA: Diagnosis not present

## 2019-04-16 MED ORDER — QUETIAPINE FUMARATE 200 MG PO TABS: 200.0000 mg | ORAL_TABLET | Freq: Every day | ORAL | 1 refills | Status: DC

## 2019-04-16 NOTE — Progress Notes (Signed)
Virtual Visit via telephone Note  I connected with Tasha Clarke on 04/16/19 at 0959 by telephone and verified that I am speaking with the correct person using two identifiers. Tasha Clarke is currently located at home and husband bryan are currently with her during visit. The provider, Fransisca Kaufmann Dettinger, MD is located in their office at time of visit.  Call ended at 1017  I discussed the limitations, risks, security and privacy concerns of performing an evaluation and management service by telephone and the availability of in person appointments. I also discussed with the patient that there may be a patient responsible charge related to this service. The patient expressed understanding and agreed to proceed.   History and Present Illness: Patient is calling in for a recheck for the depression and anxiety and her pain has drastically increased and she is not sleeping and she sees pain clinic tomorrow. She denies any suicidal ideations or thoughts of hurting herself. She feels her anxiety and pain is drastically increased. Patient is currently taking Cymbalta 90 and Seroquel 100 mg nightly and denies any side effects but she says is not enough especially with currently dealing with her husband who is been diagnosed with tumors and cancer and his health is significantly declining  No diagnosis found.  Outpatient Encounter Medications as of 04/16/2019  Medication Sig  . albuterol (PROVENTIL HFA) 108 (90 Base) MCG/ACT inhaler Inhale into the lungs.  Marland Kitchen alendronate (FOSAMAX) 70 MG tablet Take 1 tablet (70 mg total) by mouth every Monday. Take with a full glass of water on an empty stomach.  . butalbital-acetaminophen-caffeine (FIORICET, ESGIC) 50-325-40 MG tablet Take by mouth 2 (two) times daily as needed for headache or migraine.  . Cholecalciferol (VITAMIN D-3) 5000 units TABS Take 5,000 Units by mouth daily.  . Cyanocobalamin (VITAMIN B-12 PO) Take by mouth daily.  .  cyanocobalamin 100 MCG tablet Take 100 mcg by mouth.  . cyclobenzaprine (FLEXERIL) 10 MG tablet Take 1 tablet (10 mg total) by mouth at bedtime.  . dicyclomine (BENTYL) 10 MG capsule Take 1 capsule (10 mg total) by mouth 4 (four) times daily as needed (for diarrhea).  . diphenhydramine-acetaminophen (TYLENOL PM EXTRA STRENGTH) 25-500 MG TABS tablet Take 1 tablet by mouth at bedtime as needed (pain).   . DULoxetine (CYMBALTA) 30 MG capsule Take 3 capsules (90 mg total) by mouth daily.  Marland Kitchen EPINEPHrine 0.3 mg/0.3 mL IJ SOAJ injection Inject 0.3 mLs (0.3 mg total) into the muscle as needed.  Noelle Penner ALLERGY RELIEF, CETIRIZINE, 10 MG tablet Take 1 tablet by mouth once daily  . Ferrous Sulfate (IRON) 325 (65 Fe) MG TABS Take 1 tablet (325 mg total) by mouth daily.  . fluticasone (FLONASE) 50 MCG/ACT nasal spray Place 2 sprays into both nostrils daily.  . Fluticasone-Umeclidin-Vilant (TRELEGY ELLIPTA) 100-62.5-25 MCG/INH AEPB Inhale into the lungs 1 day or 1 dose.  . folic acid (FOLVITE) 1 MG tablet Take 1 mg by mouth daily.  Marland Kitchen gabapentin (NEURONTIN) 600 MG tablet Take 1 tablet (600 mg total) by mouth 4 (four) times daily.  Marland Kitchen ipratropium-albuterol (DUONEB) 0.5-2.5 (3) MG/3ML SOLN Take 3 mLs by nebulization every 6 (six) hours as needed (shortness of breathe).  . levETIRAcetam (KEPPRA) 1000 MG tablet Take 1 tablet (1,000 mg total) by mouth at bedtime.  . levETIRAcetam (KEPPRA) 750 MG tablet Take 1 tablet (750 mg total) by mouth every morning.  Marland Kitchen levothyroxine (SYNTHROID) 50 MCG tablet Take 1 tablet (50 mcg total) by mouth  daily before breakfast.  . Magnesium 400 MG CAPS Take 400 mg by mouth daily.  . Melatonin 10 MG TABS Take by mouth.  . metoprolol tartrate (LOPRESSOR) 25 MG tablet Take 12.5 mg by mouth 2 (two) times daily.  . Multiple Vitamin (MULTI-VITAMIN) tablet Take by mouth.  . Multiple Vitamin (MULTIVITAMIN) tablet Take 1 tablet by mouth daily.  . naloxone (NARCAN) nasal spray 4 mg/0.1 mL Place into  the nose.  . Omega-3 Fatty Acids (FISH OIL) 1000 MG CAPS Take 1,000 mg by mouth daily.  . ondansetron (ZOFRAN) 4 MG tablet TAKE 1 TABLET BY MOUTH 4 TIMES DAILY AS NEEDED  . potassium chloride (K-DUR) 10 MEQ tablet Take 1 tablet by mouth daily.  . QUEtiapine (SEROQUEL) 100 MG tablet Take 1 tablet (100 mg total) by mouth at bedtime.  . rosuvastatin (CRESTOR) 40 MG tablet Take 1 tablet (40 mg total) by mouth daily.   No facility-administered encounter medications on file as of 04/16/2019.     Review of Systems  Constitutional: Negative for chills and fever.  Eyes: Negative for visual disturbance.  Respiratory: Negative for chest tightness and shortness of breath.   Cardiovascular: Negative for chest pain and leg swelling.  Musculoskeletal: Negative for back pain and gait problem.  Skin: Negative for rash.  Neurological: Negative for light-headedness and headaches.  Psychiatric/Behavioral: Positive for decreased concentration, dysphoric mood and sleep disturbance. Negative for agitation, behavioral problems, self-injury and suicidal ideas. The patient is nervous/anxious.   All other systems reviewed and are negative.   Observations/Objective: Patient sounds comfortable and in no acute distress  Assessment and Plan: Problem List Items Addressed This Visit      Other   Depression, recurrent ([place]) - Primary   Relevant Medications   QUEtiapine (SEROQUEL) 200 MG tablet   Anxiety   Relevant Medications   QUEtiapine (SEROQUEL) 200 MG tablet      Will increase the Seroquel to 200 mg and continue Cymbalta Follow Up Instructions: Follow up in 4 weeks for depression    I discussed the assessment and treatment plan with the patient. The patient was provided an opportunity to ask questions and all were answered. The patient agreed with the plan and demonstrated an understanding of the instructions.   The patient was advised to call back or seek an in-person evaluation if the symptoms worsen  or if the condition fails to improve as anticipated.  The above assessment and management plan was discussed with the patient. The patient verbalized understanding of and has agreed to the management plan. Patient is aware to call the clinic if symptoms persist or worsen. Patient is aware when to return to the clinic for a follow-up visit. Patient educated on when it is appropriate to go to the emergency department.    I provided 18 minutes of non-face-to-face time during this encounter.    Worthy Rancher, MD

## 2019-04-17 DIAGNOSIS — Z9689 Presence of other specified functional implants: Secondary | ICD-10-CM | POA: Diagnosis not present

## 2019-04-17 DIAGNOSIS — M533 Sacrococcygeal disorders, not elsewhere classified: Secondary | ICD-10-CM | POA: Diagnosis not present

## 2019-04-17 DIAGNOSIS — G894 Chronic pain syndrome: Secondary | ICD-10-CM | POA: Diagnosis not present

## 2019-04-17 DIAGNOSIS — M5416 Radiculopathy, lumbar region: Secondary | ICD-10-CM | POA: Diagnosis not present

## 2019-04-17 DIAGNOSIS — M961 Postlaminectomy syndrome, not elsewhere classified: Secondary | ICD-10-CM | POA: Diagnosis not present

## 2019-04-17 DIAGNOSIS — M47812 Spondylosis without myelopathy or radiculopathy, cervical region: Secondary | ICD-10-CM | POA: Diagnosis not present

## 2019-04-18 ENCOUNTER — Ambulatory Visit: Payer: Medicare PPO | Admitting: Nurse Practitioner

## 2019-04-18 ENCOUNTER — Encounter: Payer: Self-pay | Admitting: *Deleted

## 2019-04-18 ENCOUNTER — Encounter: Payer: Self-pay | Admitting: Nurse Practitioner

## 2019-04-18 ENCOUNTER — Other Ambulatory Visit: Payer: Self-pay

## 2019-04-18 ENCOUNTER — Other Ambulatory Visit: Payer: Self-pay | Admitting: *Deleted

## 2019-04-18 VITALS — BP 147/71 | HR 79 | Temp 96.9°F | Ht 65.0 in | Wt 220.0 lb

## 2019-04-18 DIAGNOSIS — R1310 Dysphagia, unspecified: Secondary | ICD-10-CM | POA: Diagnosis not present

## 2019-04-18 DIAGNOSIS — K279 Peptic ulcer, site unspecified, unspecified as acute or chronic, without hemorrhage or perforation: Secondary | ICD-10-CM | POA: Diagnosis not present

## 2019-04-18 DIAGNOSIS — R1319 Other dysphagia: Secondary | ICD-10-CM

## 2019-04-18 NOTE — Assessment & Plan Note (Signed)
Dysphagia with recent EGD finding significant esophageal stricture status post dilation as well as esophageal linear erosions.  Small hiatal hernia.  Recommended changing Fosamax to parenteral option.  This is not occurred yet and I recommended the patient discussed with primary care.  Protonix was increased to twice a day.  No overt, ongoing GERD symptoms.  Recommend EGD as per below.  Follow-up in 6 months.

## 2019-04-18 NOTE — Patient Instructions (Signed)
Your health issues we discussed today were:   Dysphagia (swallowing difficulties): 1. I am glad your swallowing difficulties improved 2. Make sure you take adequate time to chew and eat, cut food into smaller bites 3. As we discussed, discuss Fosamax pill for osteoporosis with your primary care.  We recommend changing to a "parenteral" (injectable) treatment option to prevent Fosamax getting stuck in your esophagus 4. Let us know if you have any recurrent symptoms  Peptic ulcer disease (ulcer in your stomach): 1. As recommended at the end your procedure we will proceed with a updated upper endoscopy to further evaluate your ulcer 2. Continue taking Protonix twice a day 3. Call us if you have any worsening or severe problems  Overall I recommend:  1. Return for follow-up in 6 months 2. Call us if you have any questions or concerns 3. Continue taking other current medications.   Because of recent events of COVID-19 ("Coronavirus"), follow CDC recommendations:  1. Wash your hand frequently 2. Avoid touching your face 3. Stay away from people who are sick 4. If you have symptoms such as fever, cough, shortness of breath then call your healthcare provider for further guidance 5. If you are sick, STAY AT HOME unless otherwise directed by your healthcare provider. 6. Follow directions from state and national officials regarding staying safe   At [facility] we value your feedback. You may receive a survey about your visit today. Please share your experience as we strive to create trusting relationships with our patients to provide genuine, compassionate, quality care.  We appreciate your understanding and patience as we review any laboratory studies, imaging, and other diagnostic tests that are ordered as we care for you. Our office policy is 5 business days for review of these results, and any emergent or urgent results are addressed in a timely manner for your best interest.  If you do not hear from our office in 1 week, please contact us.   We also encourage the use of MyChart, which contains your medical information for your review as well. If you are not enrolled in this feature, an access code is on this after visit summary for your convenience. Thank you for allowing [place] to be involved in your care.  It was great to see you today!  I hope you have a great Fall!!

## 2019-04-18 NOTE — Assessment & Plan Note (Signed)
Recent EGD with a 1 cm deep clean-based ulcer in the pyloric channel.  Also with gastritis with goblet cell metaplasia and negative for H. pylori.  Protonix was increased to twice a day, avoid all NSAIDs, consider changing Fosamax to parental treatment given dysphagia.  Recommended repeat EGD in 3 months for surveillance.  She is currently due.  GERD symptoms doing well on twice daily Protonix.  She is still on Fosamax and we will recommend she discuss this with her primary care.  We will proceed with surveillance EGD at this time.  Proceed with EGD on propofol/MAC with Dr. Gala Romney in near future: the risks, benefits, and alternatives have been discussed with the patient in detail. The patient states understanding and desires to proceed.  The patient is currently on Flexeril, Cymbalta, iron sulfate oral, Neurontin, Seroquel, Ultram.  No other anticoagulants, anxiolytics, chronic medications, antidepressants, or iron supplements.  We will plan for the procedure on propofol/MAC to promote adequate sedation.

## 2019-04-18 NOTE — Progress Notes (Signed)
Referring Provider: Dettinger, Fransisca Kaufmann, MD Primary Care Physician:  Dettinger, Fransisca Kaufmann, MD Primary GI:  Dr. Gala Romney  Chief Complaint  Patient presents with  . pp f/u    doing ok    HPI:   Tasha Clarke is a 68 y.o. female who presents for post procedure follow-up.  The patient was last seen in our office 12/20/2018 for diarrhea, non-intractable nausea and vomiting, esophageal dysphagia.  CT during recent hospitalization with gastric wall thickening and query gastric mass.  Intermittent diarrhea.  EGD on 11/28/2017 with congestive gastropathy and antral deep folds but no I cannot find infiltrating process.  Biopsies with mild reactive gastropathy with erosion negative for H. Pylori.  At her last visit she noted recurrent diarrhea for couple months.  No further abdominal pain.  Up to 7 stools a day, no known triggers and consistently [place] 6.  80% of stools are typically loose/diarrhea.  Urgency postprandially.  Some nausea and vomiting intermittently.  Also noted dysphagia with every meal.  EGD was updated 02/12/2019 which found erosive reflux esophagitis with critical appearing Schatzki's ring status post dilation, small hiatal hernia, somewhat dilated stomach with the pyloric channel with a 1 cm deep ulcer with clean base. Normal duodenum.  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 pylori.  Recommend increase Protonix to 40 mg twice a day, avoid all NSAIDs, consider changing oral Fosamax to parenteral treatment given dysphaGia, repeat EGD in 3 months for surveillance of ulcer healing.  Today she states she's doing well overall. Dysphagia has improved significantly. Rare mid-abdominal discomfort that isn't bad, much improved. Baseline nausea, no vomiting. Denies hematochezia, melena, fever, chills, unintentional weight loss. Denies URI or flu-like symptoms. Denies loss of sense of taste or smell. She does have a chronic  cough, currently at baseline. Denies chest pain, dyspnea, dizziness, lightheadedness, syncope, near syncope. Denies any other upper or lower GI symptoms.  Diarrhea has "just about stopped."  Past Medical History:  Diagnosis Date  . Allergy   . Anemia   . Anxiety   . Arthritis   . Asthma   . Blood transfusion without reported diagnosis   . Cataract   . CHF (congestive heart failure) ([place])   . Chronic kidney disease   . Clotting disorder ([place])   . COPD (chronic obstructive pulmonary disease) ([place])   . Depression   . Diabetes mellitus without complication ([place])    pt states she is not diabetic   . Emphysema of lung ([place])   . GERD (gastroesophageal reflux disease)   . Hyperlipidemia   . Hypertension   . Neuromuscular disorder ([place])   . Osteoporosis   . Seizures ([place])   . Skin cancer    2 areas removed more than 10 years ago  . Sleep apnea 1985   wears cpap nightly  . Thyroid disease     Past Surgical History:  Procedure Laterality Date  . APPENDECTOMY    . BIOPSY  03/16/2018   Procedure: BIOPSY;  Surgeon: Daneil Dolin, MD;  [facility];  Service: Endoscopy;;  gastric bx's  . BIOPSY  02/12/2019   Procedure: BIOPSY;  Surgeon: Daneil Dolin, MD;  [facility];  Service: Endoscopy;;  gastric/pyloric channel  . CAROTID ENDARTERECTOMY Bilateral   . CERVICAL SPINE SURGERY     after neck fracture  . ESOPHAGOGASTRODUODENOSCOPY (EGD) WITH PROPOFOL N/A 03/16/2018   Procedure: ESOPHAGOGASTRODUODENOSCOPY (EGD) WITH PROPOFOL;  Surgeon: Gala Romney,  [name], MD;  [facility];  Service: Endoscopy;  Laterality: N/A;  3:00pm  . ESOPHAGOGASTRODUODENOSCOPY (EGD) WITH PROPOFOL N/A 02/12/2019   Procedure: ESOPHAGOGASTRODUODENOSCOPY (EGD) WITH PROPOFOL;  Surgeon: Daneil Dolin, MD;  [facility];  Service: Endoscopy;  Laterality: N/A;  1:30pm  . EYE SURGERY    . FRACTURE SURGERY    . heart stents    . JOINT REPLACEMENT     Right knee  . MALONEY  DILATION N/A 02/12/2019   Procedure: Venia Minks DILATION;  Surgeon: Daneil Dolin, MD;  [facility]: [facility];  Service: Endoscopy;  Laterality: N/A;  . SPINAL CORD STIMULATOR IMPLANT  2015  . SPINE SURGERY     lumbar x4     Current Outpatient Medications  Medication Sig Dispense Refill  . albuterol (PROVENTIL HFA) 108 (90 Base) MCG/ACT inhaler Inhale into the lungs.    Marland Kitchen alendronate (FOSAMAX) 70 MG tablet Take 1 tablet (70 mg total) by mouth every Monday. Take with a full glass of water on an empty stomach. 13 tablet 3  . Cholecalciferol (VITAMIN D-3) 5000 units TABS Take 5,000 Units by mouth daily.    . Cyanocobalamin (VITAMIN B-12 PO) Take by mouth daily.    . cyanocobalamin 100 MCG tablet Take 100 mcg by mouth.    . cyclobenzaprine (FLEXERIL) 10 MG tablet Take 1 tablet (10 mg total) by mouth at bedtime. 30 tablet 0  . dicyclomine (BENTYL) 10 MG capsule Take 1 capsule (10 mg total) by mouth 4 (four) times daily as needed (for diarrhea). 90 capsule 0  . diphenhydramine-acetaminophen (TYLENOL PM EXTRA STRENGTH) 25-500 MG TABS tablet Take 1 tablet by mouth at bedtime as needed (pain).     . DULoxetine (CYMBALTA) 30 MG capsule Take 3 capsules (90 mg total) by mouth daily. 90 capsule 3  . EPINEPHrine 0.3 mg/0.3 mL IJ SOAJ injection Inject 0.3 mLs (0.3 mg total) into the muscle as needed. 1 each 1  . EQ ALLERGY RELIEF, CETIRIZINE, 10 MG tablet Take 1 tablet by mouth once daily 90 tablet 0  . Ferrous Sulfate (IRON) 325 (65 Fe) MG TABS Take 1 tablet (325 mg total) by mouth daily. 90 each 0  . fluticasone (FLONASE) 50 MCG/ACT nasal spray Place 2 sprays into both nostrils daily. 16 g 2  . Fluticasone-Umeclidin-Vilant (TRELEGY ELLIPTA) 100-62.5-25 MCG/INH AEPB Inhale into the lungs 1 day or 1 dose.    . folic acid (FOLVITE) 1 MG tablet Take 1 mg by mouth daily.    Marland Kitchen gabapentin (NEURONTIN) 600 MG tablet Take 1 tablet (600 mg total) by mouth 4 (four) times daily. 360 tablet 3  . ipratropium-albuterol  (DUONEB) 0.5-2.5 (3) MG/3ML SOLN Take 3 mLs by nebulization every 6 (six) hours as needed (shortness of breathe). 360 mL 1  . levETIRAcetam (KEPPRA) 1000 MG tablet Take 1 tablet (1,000 mg total) by mouth at bedtime. 90 tablet 3  . levETIRAcetam (KEPPRA) 750 MG tablet Take 1 tablet (750 mg total) by mouth every morning. 90 tablet 3  . levothyroxine (SYNTHROID) 50 MCG tablet Take 1 tablet (50 mcg total) by mouth daily before breakfast. 90 tablet 3  . Magnesium 400 MG CAPS Take 400 mg by mouth daily.    . Melatonin 10 MG TABS Take by mouth.    . metoprolol tartrate (LOPRESSOR) 25 MG tablet Take 12.5 mg by mouth 2 (two) times daily.    . Multiple Vitamin (MULTI-VITAMIN) tablet Take by mouth.    . Multiple Vitamin (MULTIVITAMIN)  tablet Take 1 tablet by mouth daily.    . naloxone (NARCAN) nasal spray 4 mg/0.1 mL Place into the nose.    . Omega-3 Fatty Acids (FISH OIL) 1000 MG CAPS Take 1,000 mg by mouth daily.    . ondansetron (ZOFRAN) 4 MG tablet TAKE 1 TABLET BY MOUTH 4 TIMES DAILY AS NEEDED 20 tablet 3  . potassium chloride (K-DUR) 10 MEQ tablet Take 1 tablet by mouth daily.    . QUEtiapine (SEROQUEL) 200 MG tablet Take 1 tablet (200 mg total) by mouth at bedtime. 30 tablet 1  . rosuvastatin (CRESTOR) 40 MG tablet Take 1 tablet (40 mg total) by mouth daily. 90 tablet 3  . traMADol (ULTRAM) 50 MG tablet Take 1 tablet by mouth every 8 (eight) hours as needed.     No current facility-administered medications for this visit.     Allergies as of 04/18/2019 - Review Complete 04/18/2019  Allergen Reaction Noted  . Cephalexin Hives 01/24/2013  . Ketorolac tromethamine Hives 07/26/2017  . Lac bovis Nausea And Vomiting 05/28/2015  . Imdur  [isosorbide dinitrate]    . Iodinated diagnostic agents  05/28/2015  . Other Other (See Comments) 01/24/2013  . Sodium bicarbonate-citric acid  08/24/2017  . Milk-related compounds Nausea And Vomiting 05/28/2015    Family History  Adopted: Yes  Problem  Relation Age of Onset  . Arthritis Mother   . Asthma Mother   . Cancer Mother        lung  . Depression Mother   . Diabetes Mother   . Arthritis Father   . Asthma Father   . Depression Father   . Asthma Sister   . Depression Sister   . Diabetes Sister   . Arthritis Maternal Grandmother   . Asthma Maternal Grandmother   . Depression Maternal Grandmother   . Arthritis Maternal Grandfather   . Asthma Maternal Grandfather   . Arthritis Paternal Grandmother   . Asthma Paternal Grandmother   . Arthritis Paternal Grandfather   . Asthma Paternal Grandfather   . COPD Paternal Grandfather   . Diabetes Sister   . Mental illness Sister   . Anemia Sister   . Hypertension Son   . Post-traumatic stress disorder Son   . Allergies Daughter   . Breast cancer Neg Hx   . Seizures Neg Hx        not that she knows of, pt adopted    Social History   Socioeconomic History  . Marital status: Married    Spouse name: Not on file  . Number of children: 2  . Years of education: 13  . Highest education level: Some college, no degree  Occupational History  . Occupation: Retired  Scientific laboratory technician  . Financial resource strain: Not hard at all  . Food insecurity    Worry: Never true    Inability: Never true  . Transportation needs    Medical: No    Non-medical: No  Tobacco Use  . Smoking status: Current Every Day Smoker    Packs/day: 1.00    Years: 43.00    Pack years: 43.00    Types: Cigarettes  . Smokeless tobacco: Never Used  Substance and Sexual Activity  . Alcohol use: Yes    Frequency: Never    Comment: occasionally, not even 1 drink per month   . Drug use: No  . Sexual activity: Not Currently    Birth control/protection: Post-menopausal  Lifestyle  . Physical activity    Days per week:  0 days    Minutes per session: 0 min  . Stress: To some extent  Relationships  . Social connections    Talks on phone: More than three times a week    Gets together: Three times a week     Attends religious service: Never    Active member of club or organization: No    Attends meetings of clubs or organizations: Never    Relationship status: Married  Other Topics Concern  . Not on file  Social History Narrative   Lives at home with husband and son   Right hand dominant   Caffeine: 4-5 cups daily (coffee & mtn dew)    Review of Systems: General: Negative for anorexia, weight loss, fever, chills, fatigue, weakness. ENT: Negative for hoarseness, difficulty swallowing. CV: Negative for chest pain, angina, palpitations, peripheral edema.  Respiratory: Negative for dyspnea at rest, cough, sputum, wheezing.  GI: See history of present illness. Endo: Negative for unusual weight change.  Heme: Negative for bruising or bleeding. Allergy: Negative for rash or hives.   Physical Exam: BP (!) 147/71   Pulse 79   Temp (!) 96.9 F (36.1 C) (Temporal)   Ht 5\' 5"  (1.651 m)   Wt 220 lb (99.8 kg) Comment: pt unable to stand/pt stated weight  BMI 36.61 kg/m  General:   Alert and oriented. Pleasant and cooperative. Well-nourished and well-developed. Sitting in a wheelchair with oxygen per nasal cannula. Eyes:  Without icterus, sclera clear and conjunctiva pink.  Ears:  Normal auditory acuity. Cardiovascular:  S1, S2 present without murmurs appreciated. Extremities without clubbing or edema. Respiratory:  Mild expiratory wheezes bilaterally. No rales or rhonchi. No distress.  Gastrointestinal:  +BS, soft, non-tender and non-distended. No HSM noted. No guarding or rebound. No masses appreciated.  Rectal:  Deferred  Musculoskalatal:  Symmetrical without gross deformities. Skin:  Intact without significant lesions or rashes. Neurologic:  Alert and oriented x4;  grossly normal neurologically. Psych:  Alert and cooperative. Normal mood and affect. Heme/Lymph/Immune: No excessive bruising noted.    04/18/2019 11:30 AM   Disclaimer: This note was dictated with voice recognition  software. Similar sounding words can inadvertently be transcribed and may not be corrected upon review.

## 2019-04-25 DIAGNOSIS — J449 Chronic obstructive pulmonary disease, unspecified: Secondary | ICD-10-CM | POA: Diagnosis not present

## 2019-04-29 ENCOUNTER — Other Ambulatory Visit: Payer: Self-pay | Admitting: Family Medicine

## 2019-05-02 ENCOUNTER — Other Ambulatory Visit: Payer: Self-pay

## 2019-05-02 ENCOUNTER — Inpatient Hospital Stay (HOSPITAL_COMMUNITY): Payer: Medicare PPO | Attending: Hematology

## 2019-05-02 DIAGNOSIS — E119 Type 2 diabetes mellitus without complications: Secondary | ICD-10-CM | POA: Insufficient documentation

## 2019-05-02 DIAGNOSIS — I10 Essential (primary) hypertension: Secondary | ICD-10-CM | POA: Diagnosis not present

## 2019-05-02 DIAGNOSIS — F419 Anxiety disorder, unspecified: Secondary | ICD-10-CM | POA: Diagnosis not present

## 2019-05-02 DIAGNOSIS — F17210 Nicotine dependence, cigarettes, uncomplicated: Secondary | ICD-10-CM | POA: Insufficient documentation

## 2019-05-02 DIAGNOSIS — D509 Iron deficiency anemia, unspecified: Secondary | ICD-10-CM | POA: Diagnosis not present

## 2019-05-02 DIAGNOSIS — Z79899 Other long term (current) drug therapy: Secondary | ICD-10-CM | POA: Insufficient documentation

## 2019-05-02 DIAGNOSIS — F329 Major depressive disorder, single episode, unspecified: Secondary | ICD-10-CM | POA: Diagnosis not present

## 2019-05-02 DIAGNOSIS — Z801 Family history of malignant neoplasm of trachea, bronchus and lung: Secondary | ICD-10-CM | POA: Diagnosis not present

## 2019-05-02 DIAGNOSIS — E785 Hyperlipidemia, unspecified: Secondary | ICD-10-CM | POA: Diagnosis not present

## 2019-05-02 DIAGNOSIS — G4730 Sleep apnea, unspecified: Secondary | ICD-10-CM | POA: Diagnosis not present

## 2019-05-02 DIAGNOSIS — E079 Disorder of thyroid, unspecified: Secondary | ICD-10-CM | POA: Diagnosis not present

## 2019-05-02 DIAGNOSIS — J449 Chronic obstructive pulmonary disease, unspecified: Secondary | ICD-10-CM | POA: Insufficient documentation

## 2019-05-02 DIAGNOSIS — D508 Other iron deficiency anemias: Secondary | ICD-10-CM

## 2019-05-02 LAB — COMPREHENSIVE METABOLIC PANEL
ALT: 19 U/L (ref 0–44)
AST: 19 U/L (ref 15–41)
Albumin: 4.1 g/dL (ref 3.5–5.0)
Alkaline Phosphatase: 68 U/L (ref 38–126)
Anion gap: 10 (ref 5–15)
BUN: 32 mg/dL — ABNORMAL HIGH (ref 8–23)
CO2: 18 mmol/L — ABNORMAL LOW (ref 22–32)
Calcium: 9.1 mg/dL (ref 8.9–10.3)
Chloride: 108 mmol/L (ref 98–111)
Creatinine, Ser: 1.43 mg/dL — ABNORMAL HIGH (ref 0.44–1.00)
GFR calc Af Amer: 44 mL/min — ABNORMAL LOW (ref >60)
GFR calc non Af Amer: 38 mL/min — ABNORMAL LOW (ref >60)
Glucose, Bld: 116 mg/dL — ABNORMAL HIGH (ref 70–99)
Potassium: 4.5 mmol/L (ref 3.5–5.1)
Sodium: 136 mmol/L (ref 135–145)
Total Bilirubin: 0.4 mg/dL (ref 0.3–1.2)
Total Protein: 7.2 g/dL (ref 6.5–8.1)

## 2019-05-02 LAB — CBC WITH DIFFERENTIAL/PLATELET
Abs Immature Granulocytes: 0.06 10*3/uL (ref 0.00–0.07)
Basophils Absolute: 0 10*3/uL (ref 0.0–0.1)
Basophils Relative: 1 %
Eosinophils Absolute: 0.2 10*3/uL (ref 0.0–0.5)
Eosinophils Relative: 3 %
HCT: 36.4 % (ref 36.0–46.0)
Hemoglobin: 10.8 g/dL — ABNORMAL LOW (ref 12.0–15.0)
Immature Granulocytes: 1 %
Lymphocytes Relative: 19 %
Lymphs Abs: 1.2 10*3/uL (ref 0.7–4.0)
MCH: 29.3 pg (ref 26.0–34.0)
MCHC: 29.7 g/dL — ABNORMAL LOW (ref 30.0–36.0)
MCV: 98.6 fL (ref 80.0–100.0)
Monocytes Absolute: 0.4 10*3/uL (ref 0.1–1.0)
Monocytes Relative: 6 %
Neutro Abs: 4.6 10*3/uL (ref 1.7–7.7)
Neutrophils Relative %: 70 %
Platelets: 251 10*3/uL (ref 150–400)
RBC: 3.69 MIL/uL — ABNORMAL LOW (ref 3.87–5.11)
RDW: 16.4 % — ABNORMAL HIGH (ref 11.5–15.5)
WBC: 6.4 10*3/uL (ref 4.0–10.5)
nRBC: 0 % (ref 0.0–0.2)

## 2019-05-02 LAB — IRON AND TIBC
Iron: 63 ug/dL (ref 28–170)
Saturation Ratios: 14 % (ref 10.4–31.8)
TIBC: 448 ug/dL (ref 250–450)
UIBC: 385 ug/dL

## 2019-05-02 LAB — FERRITIN: Ferritin: 56 ng/mL (ref 11–307)

## 2019-05-09 ENCOUNTER — Ambulatory Visit (HOSPITAL_COMMUNITY): Payer: Medicare PPO | Admitting: Hematology

## 2019-05-10 ENCOUNTER — Inpatient Hospital Stay (HOSPITAL_BASED_OUTPATIENT_CLINIC_OR_DEPARTMENT_OTHER): Payer: Medicare PPO | Admitting: Hematology

## 2019-05-10 ENCOUNTER — Other Ambulatory Visit: Payer: Self-pay

## 2019-05-10 ENCOUNTER — Encounter (HOSPITAL_COMMUNITY): Payer: Self-pay | Admitting: Hematology

## 2019-05-10 VITALS — BP 130/83 | HR 68 | Temp 97.8°F | Resp 16

## 2019-05-10 DIAGNOSIS — D509 Iron deficiency anemia, unspecified: Secondary | ICD-10-CM

## 2019-05-10 DIAGNOSIS — E119 Type 2 diabetes mellitus without complications: Secondary | ICD-10-CM | POA: Diagnosis not present

## 2019-05-10 DIAGNOSIS — F419 Anxiety disorder, unspecified: Secondary | ICD-10-CM | POA: Diagnosis not present

## 2019-05-10 DIAGNOSIS — G4730 Sleep apnea, unspecified: Secondary | ICD-10-CM | POA: Diagnosis not present

## 2019-05-10 DIAGNOSIS — E079 Disorder of thyroid, unspecified: Secondary | ICD-10-CM | POA: Diagnosis not present

## 2019-05-10 DIAGNOSIS — I10 Essential (primary) hypertension: Secondary | ICD-10-CM | POA: Diagnosis not present

## 2019-05-10 DIAGNOSIS — J449 Chronic obstructive pulmonary disease, unspecified: Secondary | ICD-10-CM | POA: Diagnosis not present

## 2019-05-10 DIAGNOSIS — E785 Hyperlipidemia, unspecified: Secondary | ICD-10-CM | POA: Diagnosis not present

## 2019-05-10 DIAGNOSIS — F329 Major depressive disorder, single episode, unspecified: Secondary | ICD-10-CM | POA: Diagnosis not present

## 2019-05-10 NOTE — Patient Instructions (Addendum)
[place] at [facility] Discharge Instructions  You were seen today by Dr. Delton Coombes. He went over your recent lab results. He will schedule you for 2 Iron infusions. He will see you back in 3 months for labs and follow up.   Thank you for choosing [place] at [facility] to provide your oncology and hematology care.  To afford each patient quality time with our provider, please arrive at least 15 minutes before your scheduled appointment time.   If you have a lab appointment with the [facility] please come in thru the  Main Entrance and check in at the main information desk  You need to re-schedule your appointment should you arrive 10 or more minutes late.  We strive to give you quality time with our providers, and arriving late affects you and other patients whose appointments are after yours.  Also, if you no show three or more times for appointments you may be dismissed from the clinic at the providers discretion.     Again, thank you for choosing [facility].  Our hope is that these requests will decrease the amount of time that you wait before being seen by our physicians.       _____________________________________________________________  Should you have questions after your visit to [facility], please contact our office at [phone] between the hours of 8:00 a.m. and 4:30 p.m.  Voicemails left after 4:00 p.m. will not be returned until the following business day.  For prescription refill requests, have your pharmacy contact our office and allow 72 hours.    Cancer Center Support Programs:   > Cancer Support Group  2nd Tuesday of the month 1pm-2pm, Journey Room

## 2019-05-11 ENCOUNTER — Encounter (HOSPITAL_COMMUNITY): Payer: Self-pay | Admitting: Hematology

## 2019-05-11 NOTE — Assessment & Plan Note (Signed)
1.  Normocytic anemia: -Etiology-CKD and iron deficiency state. -EGD on 02/12/2019 shows erosive reflux esophagitis.  Small hiatal hernia.  Critical appearing Schatzki's ring status post dilatation.  Normal duodenal bulb and second part of duodenum. -Colonoscopy on 01/25/2011 shows diverticulosis, 0.9 cm sigmoid colon polyp, 0.5 cm polyp in the transverse colon. -Last Feraheme infusion on 01/10/2019, 01/17/2019. -Hemoglobin dropped to 10.8 from 12.5.  Ferritin also is low at 56 and percent saturation of 14. -I have recommended 2 more infusions of Feraheme. -She has recently moved to [facility].  We will make a referral to Drs. Ennever/Zhao.  2.  Smoking history: -CT chest low-dose scan on 08/07/2018 was Lung-RADS Category 2. -She will need another scan in January 2021.

## 2019-05-11 NOTE — Progress Notes (Signed)
[address], [place] [address]   CLINIC:  Medical Oncology/Hematology  PCP:  Dettinger, Fransisca Kaufmann, MD [place] [id number] [phone]   REASON FOR VISIT: Follow-up for iron deficiency anemia.   CURRENT THERAPY: intermittent iron infusions   INTERVAL HISTORY:  Ms. Suver 68 y.o. female seen for follow-up of iron deficiency anemia.  She had diarrhea with black stool lasted couple of days, after her last blood work.  Reports some fatigue and shortness of breath on exertion.  Also reports some sleep disturbance.  Denies any chest pains or lightheadedness.  Appetite and energy levels are low.  Last Feraheme infusion was on 01/17/2019.  Denies any nausea or vomiting.   REVIEW OF SYSTEMS:  Review of Systems  Constitutional: Positive for fatigue.  Respiratory: Positive for shortness of breath.   Neurological: Positive for numbness.  Psychiatric/Behavioral: Positive for sleep disturbance.  All other systems reviewed and are negative.    PAST MEDICAL/SURGICAL HISTORY:  Past Medical History:  Diagnosis Date  . Allergy   . Anemia   . Anxiety   . Arthritis   . Asthma   . Blood transfusion without reported diagnosis   . Cataract   . CHF (congestive heart failure) ([place])   . Chronic kidney disease   . Clotting disorder ([place])   . COPD (chronic obstructive pulmonary disease) ([place])   . Depression   . Diabetes mellitus without complication ([place])    pt states she is not diabetic   . Emphysema of lung ([place])   . GERD (gastroesophageal reflux disease)   . Hyperlipidemia   . Hypertension   . Neuromuscular disorder ([place])   . Osteoporosis   . Seizures ([place])   . Skin cancer    2 areas removed more than 10 years ago  . Sleep apnea 1985   wears cpap nightly  . Thyroid disease    Past Surgical History:  Procedure Laterality Date  . APPENDECTOMY    . BIOPSY  03/16/2018   Procedure: BIOPSY;  Surgeon: Daneil Dolin, MD;  [facility];  Service: Endoscopy;;  gastric bx's  . BIOPSY  02/12/2019   Procedure: BIOPSY;  Surgeon: Daneil Dolin, MD;  [facility];  Service: Endoscopy;;  gastric/pyloric channel  . CAROTID ENDARTERECTOMY Bilateral   . CERVICAL SPINE SURGERY     after neck fracture  . ESOPHAGOGASTRODUODENOSCOPY (EGD) WITH PROPOFOL N/A 03/16/2018   Procedure: ESOPHAGOGASTRODUODENOSCOPY (EGD) WITH PROPOFOL;  Surgeon: Daneil Dolin, MD;  [facility];  Service: Endoscopy;  Laterality: N/A;  3:00pm  . ESOPHAGOGASTRODUODENOSCOPY (EGD) WITH PROPOFOL N/A 02/12/2019   Procedure: ESOPHAGOGASTRODUODENOSCOPY (EGD) WITH PROPOFOL;  Surgeon: Daneil Dolin, MD;  [facility];  Service: Endoscopy;  Laterality: N/A;  1:30pm  . EYE SURGERY    . FRACTURE SURGERY    . heart stents    . JOINT REPLACEMENT     Right knee  . MALONEY DILATION N/A 02/12/2019   Procedure: Venia Minks DILATION;  Surgeon: Daneil Dolin, MD;  [facility];  Service: Endoscopy;  Laterality: N/A;  . SPINAL CORD STIMULATOR IMPLANT  2015  . SPINE SURGERY     lumbar x4      SOCIAL HISTORY:  Social History   Socioeconomic History  . Marital status: Married    Spouse name: Not on file  . Number of children: 2  . Years of education: 57  . Highest education level:  Some college, no degree  Occupational History  . Occupation: Retired  Scientific laboratory technician  . Financial resource strain: Not hard at all  . Food insecurity    Worry: Never true    Inability: Never true  . Transportation needs    Medical: No    Non-medical: No  Tobacco Use  . Smoking status: Current Every Day Smoker    Packs/day: 1.00    Years: 43.00    Pack years: 43.00    Types: Cigarettes  . Smokeless tobacco: Never Used  Substance and Sexual Activity  . Alcohol use: Yes    Frequency: Never    Comment: occasionally, not even 1 drink per month   . Drug use: No  . Sexual activity: Not Currently    Birth control/protection:  Post-menopausal  Lifestyle  . Physical activity    Days per week: 0 days    Minutes per session: 0 min  . Stress: To some extent  Relationships  . Social connections    Talks on phone: More than three times a week    Gets together: Three times a week    Attends religious service: Never    Active member of club or organization: No    Attends meetings of clubs or organizations: Never    Relationship status: Married  . Intimate partner violence    Fear of current or ex partner: No    Emotionally abused: No    Physically abused: No    Forced sexual activity: No  Other Topics Concern  . Not on file  Social History Narrative   Lives at home with husband and son   Right hand dominant   Caffeine: 4-5 cups daily (coffee & mtn dew)    FAMILY HISTORY:  Family History  Adopted: Yes  Problem Relation Age of Onset  . Arthritis Mother   . Asthma Mother   . Cancer Mother        lung  . Depression Mother   . Diabetes Mother   . Arthritis Father   . Asthma Father   . Depression Father   . Asthma Sister   . Depression Sister   . Diabetes Sister   . Arthritis Maternal Grandmother   . Asthma Maternal Grandmother   . Depression Maternal Grandmother   . Arthritis Maternal Grandfather   . Asthma Maternal Grandfather   . Arthritis Paternal Grandmother   . Asthma Paternal Grandmother   . Arthritis Paternal Grandfather   . Asthma Paternal Grandfather   . COPD Paternal Grandfather   . Diabetes Sister   . Mental illness Sister   . Anemia Sister   . Hypertension Son   . Post-traumatic stress disorder Son   . Allergies Daughter   . Breast cancer Neg Hx   . Seizures Neg Hx        not that she knows of, pt adopted    CURRENT MEDICATIONS:  Outpatient Encounter Medications as of 05/10/2019  Medication Sig Note  . buprenorphine (BUTRANS) 20 MCG/HR PTWK patch Place onto the skin once a week.    Marland Kitchen albuterol (PROVENTIL HFA) 108 (90 Base) MCG/ACT inhaler Inhale into the lungs.   Marland Kitchen  alendronate (FOSAMAX) 70 MG tablet Take 1 tablet (70 mg total) by mouth every Monday. Take with a full glass of water on an empty stomach.   . Cholecalciferol (VITAMIN D-3) 5000 units TABS Take 5,000 Units by mouth daily.   . Cyanocobalamin (VITAMIN B-12 PO) Take by mouth daily.   . cyanocobalamin  100 MCG tablet Take 100 mcg by mouth.   . cyclobenzaprine (FLEXERIL) 10 MG tablet TAKE 1 TABLET BY MOUTH AT BEDTIME   . dicyclomine (BENTYL) 10 MG capsule Take 1 capsule (10 mg total) by mouth 4 (four) times daily as needed (for diarrhea).   . diphenhydramine-acetaminophen (TYLENOL PM EXTRA STRENGTH) 25-500 MG TABS tablet Take 1 tablet by mouth at bedtime as needed (pain).    . DULoxetine (CYMBALTA) 30 MG capsule Take 3 capsules (90 mg total) by mouth daily.   Marland Kitchen EPINEPHrine 0.3 mg/0.3 mL IJ SOAJ injection Inject 0.3 mLs (0.3 mg total) into the muscle as needed.   Noelle Penner ALLERGY RELIEF, CETIRIZINE, 10 MG tablet Take 1 tablet by mouth once daily   . Ferrous Sulfate (IRON) 325 (65 Fe) MG TABS Take 1 tablet (325 mg total) by mouth daily.   . fluticasone (FLONASE) 50 MCG/ACT nasal spray Place 2 sprays into both nostrils daily.   . Fluticasone-Umeclidin-Vilant (TRELEGY ELLIPTA) 100-62.5-25 MCG/INH AEPB Inhale into the lungs 1 day or 1 dose.   . folic acid (FOLVITE) 1 MG tablet Take 1 mg by mouth daily.   Marland Kitchen gabapentin (NEURONTIN) 600 MG tablet Take 1 tablet (600 mg total) by mouth 4 (four) times daily.   Marland Kitchen ipratropium-albuterol (DUONEB) 0.5-2.5 (3) MG/3ML SOLN Take 3 mLs by nebulization every 6 (six) hours as needed (shortness of breathe).   . levETIRAcetam (KEPPRA) 1000 MG tablet Take 1 tablet (1,000 mg total) by mouth at bedtime.   . levETIRAcetam (KEPPRA) 750 MG tablet Take 1 tablet (750 mg total) by mouth every morning.   Marland Kitchen levothyroxine (SYNTHROID) 50 MCG tablet Take 1 tablet (50 mcg total) by mouth daily before breakfast.   . Magnesium 400 MG CAPS Take 400 mg by mouth daily.   . Melatonin 10 MG TABS Take  by mouth.   . metoprolol tartrate (LOPRESSOR) 25 MG tablet Take 12.5 mg by mouth 2 (two) times daily.   . Multiple Vitamin (MULTI-VITAMIN) tablet Take by mouth.   . Multiple Vitamin (MULTIVITAMIN) tablet Take 1 tablet by mouth daily.   . naloxone (NARCAN) nasal spray 4 mg/0.1 mL Place into the nose. 03/28/2018: Has but has never needed to use it.  . Omega-3 Fatty Acids (FISH OIL) 1000 MG CAPS Take 1,000 mg by mouth daily.   . ondansetron (ZOFRAN) 4 MG tablet TAKE 1 TABLET BY MOUTH 4 TIMES DAILY AS NEEDED   . pantoprazole (PROTONIX) 40 MG tablet Take 40 mg by mouth 2 (two) times daily.   . potassium chloride (K-DUR) 10 MEQ tablet Take 1 tablet by mouth daily.   . QUEtiapine (SEROQUEL) 200 MG tablet Take 1 tablet (200 mg total) by mouth at bedtime.   . rosuvastatin (CRESTOR) 40 MG tablet Take 1 tablet (40 mg total) by mouth daily.   . traMADol (ULTRAM) 50 MG tablet Take 1 tablet by mouth every 8 (eight) hours as needed.    No facility-administered encounter medications on file as of 05/10/2019.     ALLERGIES:  Allergies  Allergen Reactions  . Cephalexin Hives  . Ketorolac Tromethamine Hives  . Lac Bovis Nausea And Vomiting  . Imdur  [Isosorbide Dinitrate]   . Iodinated Diagnostic Agents   . Other Other (See Comments)    Alka seltzer causes blurred vision, fainting  . Sodium Bicarbonate-Citric Acid   . Milk-Related Compounds Nausea And Vomiting     PHYSICAL EXAM:  ECOG Performance status: 1  Vitals:   05/10/19 1133  BP: 130/83  Pulse: 68  Resp: 16  Temp: 97.8 F (36.6 C)  SpO2: 97%   Filed Weights    Physical Exam Constitutional:      Appearance: She is well-developed.  Cardiovascular:     Rate and Rhythm: Normal rate and regular rhythm.     Heart sounds: Normal heart sounds.  Pulmonary:     Effort: Pulmonary effort is normal.     Breath sounds: Wheezing present.  Abdominal:     General: There is no distension.     Palpations: Abdomen is soft. There is no mass.   Musculoskeletal:     Comments: wheelchair  Skin:    General: Skin is warm and dry.  Neurological:     Mental Status: She is alert and oriented to person, place, and time.  Psychiatric:        Mood and Affect: Mood normal.        Behavior: Behavior normal.      LABORATORY DATA:  I have reviewed the labs as listed.  CBC    Component Value Date/Time   WBC 6.4 05/02/2019 0850   RBC 3.69 (L) 05/02/2019 0850   HGB 10.8 (L) 05/02/2019 0850   HGB 11.0 (L) 07/26/2018 0929   HCT 36.4 05/02/2019 0850   HCT 33.7 (L) 07/26/2018 0929   PLT 251 05/02/2019 0850   PLT 308 07/26/2018 0929   MCV 98.6 05/02/2019 0850   MCV 91 07/26/2018 0929   MCH 29.3 05/02/2019 0850   MCHC 29.7 (L) 05/02/2019 0850   RDW 16.4 (H) 05/02/2019 0850   RDW 15.9 (H) 07/26/2018 0929   LYMPHSABS 1.2 05/02/2019 0850   LYMPHSABS 1.5 07/26/2018 0929   MONOABS 0.4 05/02/2019 0850   EOSABS 0.2 05/02/2019 0850   EOSABS 0.3 07/26/2018 0929   BASOSABS 0.0 05/02/2019 0850   BASOSABS 0.1 07/26/2018 0929   CMP Latest Ref Rng & Units 05/02/2019 01/23/2019 12/28/2018  Glucose 70 - 99 mg/dL 116(H) 92 119(H)  BUN 8 - 23 mg/dL 32(H) 15 23  Creatinine 0.44 - 1.00 mg/dL 1.43(H) 1.02(H) 1.07(H)  Sodium 135 - 145 mmol/L 136 138 138  Potassium 3.5 - 5.1 mmol/L 4.5 3.7 3.7  Chloride 98 - 111 mmol/L 108 104 105  CO2 22 - 32 mmol/L 18(L) 25 23  Calcium 8.9 - 10.3 mg/dL 9.1 9.8 8.6(L)  Total Protein 6.5 - 8.1 g/dL 7.2 7.2 7.0  Total Bilirubin 0.3 - 1.2 mg/dL 0.4 0.2(L) 0.3  Alkaline Phos 38 - 126 U/L 68 58 56  AST 15 - 41 U/L 19 21 21   ALT 0 - 44 U/L 19 14 13     Radiology: -I have personally reviewed the CT scan images and discussed with patient.    ASSESSMENT & PLAN:   Iron deficiency anemia 1.  Normocytic anemia: -Etiology-CKD and iron deficiency state. -EGD on 02/12/2019 shows erosive reflux esophagitis.  Small hiatal hernia.  Critical appearing Schatzki's ring status post dilatation.  Normal duodenal bulb and second  part of duodenum. -Colonoscopy on 01/25/2011 shows diverticulosis, 0.9 cm sigmoid colon polyp, 0.5 cm polyp in the transverse colon. -Last Feraheme infusion on 01/10/2019, 01/17/2019. -Hemoglobin dropped to 10.8 from 12.5.  Ferritin also is low at 56 and percent saturation of 14. -I have recommended 2 more infusions of Feraheme. -She has recently moved to [facility].  We will make a referral to Drs. Ennever/Zhao.  2.  Smoking history: -CT chest low-dose scan on 08/07/2018 was Lung-RADS Category 2. -She will need another scan in  January 2021.     Orders placed this encounter:  Orders Placed This Encounter  Procedures  . CBC with Differential/Platelet  . Comprehensive metabolic panel  . Iron and TIBC  . Ferritin  . Vitamin B12  . Folate      Derek Jack, MD [place] [phone]

## 2019-05-14 ENCOUNTER — Telehealth: Payer: Self-pay | Admitting: Family

## 2019-05-14 NOTE — Telephone Encounter (Signed)
Spoke with patient husband to confirm new patient appt to transfer care 11/16 at 1030 am

## 2019-05-16 ENCOUNTER — Encounter (HOSPITAL_COMMUNITY): Payer: Self-pay

## 2019-05-16 ENCOUNTER — Other Ambulatory Visit: Payer: Self-pay

## 2019-05-16 ENCOUNTER — Inpatient Hospital Stay (HOSPITAL_COMMUNITY): Payer: Medicare PPO | Attending: Hematology

## 2019-05-16 VITALS — BP 91/57 | HR 77 | Temp 97.5°F | Resp 18

## 2019-05-16 DIAGNOSIS — D509 Iron deficiency anemia, unspecified: Secondary | ICD-10-CM | POA: Insufficient documentation

## 2019-05-16 DIAGNOSIS — D508 Other iron deficiency anemias: Secondary | ICD-10-CM

## 2019-05-16 DIAGNOSIS — Z87898 Personal history of other specified conditions: Secondary | ICD-10-CM

## 2019-05-16 MED ORDER — SODIUM CHLORIDE 0.9 % IV SOLN
Freq: Once | INTRAVENOUS | Status: AC
  Administered 2019-05-16: 11:00:00 via INTRAVENOUS

## 2019-05-16 MED ORDER — SODIUM CHLORIDE 0.9 % IV SOLN
510.0000 mg | Freq: Once | INTRAVENOUS | Status: DC
  Filled 2019-05-16: qty 17

## 2019-05-16 NOTE — Progress Notes (Signed)
1115 Pt complained of pain at IV site but no swelling noted. IV removed with bandaid applied to site. Unable to get another IV access for Iron infusion. Multiple attempts were unsuccessful but pt tolerated well.Dr. Delton Coombes notified and pt scheduled for consult for portacath placement per Dr. Tomie China order. Explained this information with the pt who verbalized understanding. Pt discharged via wheelchair in satisfactory condition accompanied by her husband

## 2019-05-18 ENCOUNTER — Other Ambulatory Visit: Payer: Self-pay | Admitting: Physician Assistant

## 2019-05-21 ENCOUNTER — Other Ambulatory Visit: Payer: Self-pay

## 2019-05-21 ENCOUNTER — Ambulatory Visit (HOSPITAL_COMMUNITY)
Admission: RE | Admit: 2019-05-21 | Discharge: 2019-05-21 | Disposition: A | Discharge status: Home / Self Care | Payer: Medicare PPO | Source: Ambulatory Visit | Attending: Hematology | Admitting: Hematology

## 2019-05-21 ENCOUNTER — Encounter (HOSPITAL_COMMUNITY): Payer: Self-pay

## 2019-05-21 ENCOUNTER — Other Ambulatory Visit: Payer: Self-pay | Admitting: Radiology

## 2019-05-21 DIAGNOSIS — G4730 Sleep apnea, unspecified: Secondary | ICD-10-CM | POA: Diagnosis not present

## 2019-05-21 DIAGNOSIS — Z79899 Other long term (current) drug therapy: Secondary | ICD-10-CM | POA: Insufficient documentation

## 2019-05-21 DIAGNOSIS — Z87898 Personal history of other specified conditions: Secondary | ICD-10-CM

## 2019-05-21 DIAGNOSIS — I509 Heart failure, unspecified: Secondary | ICD-10-CM | POA: Diagnosis not present

## 2019-05-21 DIAGNOSIS — D509 Iron deficiency anemia, unspecified: Secondary | ICD-10-CM | POA: Insufficient documentation

## 2019-05-21 DIAGNOSIS — K219 Gastro-esophageal reflux disease without esophagitis: Secondary | ICD-10-CM | POA: Diagnosis not present

## 2019-05-21 DIAGNOSIS — I130 Hypertensive heart and chronic kidney disease with heart failure and stage 1 through stage 4 chronic kidney disease, or unspecified chronic kidney disease: Secondary | ICD-10-CM | POA: Diagnosis not present

## 2019-05-21 DIAGNOSIS — J449 Chronic obstructive pulmonary disease, unspecified: Secondary | ICD-10-CM | POA: Diagnosis not present

## 2019-05-21 DIAGNOSIS — N189 Chronic kidney disease, unspecified: Secondary | ICD-10-CM | POA: Diagnosis not present

## 2019-05-21 DIAGNOSIS — E1122 Type 2 diabetes mellitus with diabetic chronic kidney disease: Secondary | ICD-10-CM | POA: Insufficient documentation

## 2019-05-21 DIAGNOSIS — Z79890 Hormone replacement therapy (postmenopausal): Secondary | ICD-10-CM | POA: Insufficient documentation

## 2019-05-21 DIAGNOSIS — E079 Disorder of thyroid, unspecified: Secondary | ICD-10-CM | POA: Diagnosis not present

## 2019-05-21 DIAGNOSIS — Z452 Encounter for adjustment and management of vascular access device: Secondary | ICD-10-CM | POA: Diagnosis not present

## 2019-05-21 DIAGNOSIS — F329 Major depressive disorder, single episode, unspecified: Secondary | ICD-10-CM | POA: Diagnosis not present

## 2019-05-21 DIAGNOSIS — F17210 Nicotine dependence, cigarettes, uncomplicated: Secondary | ICD-10-CM | POA: Diagnosis not present

## 2019-05-21 HISTORY — PX: IR IMAGING GUIDED PORT INSERTION: IMG5740

## 2019-05-21 LAB — CBC
HCT: 40.1 % (ref 36.0–46.0)
Hemoglobin: 12 g/dL (ref 12.0–15.0)
MCH: 29.3 pg (ref 26.0–34.0)
MCHC: 29.9 g/dL — ABNORMAL LOW (ref 30.0–36.0)
MCV: 98 fL (ref 80.0–100.0)
Platelets: 303 10*3/uL (ref 150–400)
RBC: 4.09 MIL/uL (ref 3.87–5.11)
RDW: 17.6 % — ABNORMAL HIGH (ref 11.5–15.5)
WBC: 8.6 10*3/uL (ref 4.0–10.5)
nRBC: 0 % (ref 0.0–0.2)

## 2019-05-21 LAB — GLUCOSE, CAPILLARY: Glucose-Capillary: 96 mg/dL (ref 70–99)

## 2019-05-21 MED ORDER — FENTANYL CITRATE (PF) 100 MCG/2ML IJ SOLN
INTRAMUSCULAR | Status: AC
  Filled 2019-05-21: qty 2

## 2019-05-21 MED ORDER — FENTANYL CITRATE (PF) 100 MCG/2ML IJ SOLN
INTRAMUSCULAR | Status: AC | PRN
  Administered 2019-05-21 (×2): 25 ug via INTRAVENOUS

## 2019-05-21 MED ORDER — MIDAZOLAM HCL 2 MG/2ML IJ SOLN
INTRAMUSCULAR | Status: AC
  Filled 2019-05-21: qty 2

## 2019-05-21 MED ORDER — LIDOCAINE HCL (PF) 1 % IJ SOLN
INTRAMUSCULAR | Status: AC | PRN
  Administered 2019-05-21: 15 mL

## 2019-05-21 MED ORDER — MIDAZOLAM HCL 2 MG/2ML IJ SOLN
INTRAMUSCULAR | Status: AC | PRN
  Administered 2019-05-21: 1 mg via INTRAVENOUS

## 2019-05-21 MED ORDER — SODIUM CHLORIDE 0.9 % IV SOLN: INTRAVENOUS | Status: DC

## 2019-05-21 MED ORDER — VANCOMYCIN HCL IN DEXTROSE 1-5 GM/200ML-% IV SOLN
INTRAVENOUS | Status: AC
  Filled 2019-05-21: qty 200

## 2019-05-21 MED ORDER — VANCOMYCIN HCL IN DEXTROSE 1-5 GM/200ML-% IV SOLN
1000.0000 mg | Freq: Once | INTRAVENOUS | Status: AC
  Administered 2019-05-21: 1000 mg via INTRAVENOUS

## 2019-05-21 MED ORDER — LIDOCAINE-EPINEPHRINE (PF) 1 %-1:200000 IJ SOLN
INTRAMUSCULAR | Status: AC
  Filled 2019-05-21: qty 30

## 2019-05-21 MED ORDER — HEPARIN SOD (PORK) LOCK FLUSH 100 UNIT/ML IV SOLN
INTRAVENOUS | Status: AC
  Administered 2019-05-21: 500 [IU]
  Filled 2019-05-21: qty 5

## 2019-05-21 NOTE — Progress Notes (Signed)
Called radiology, lab called to let us know her blood was clotted, pt is on her way down to radiology and will need to be redrawn. Nurse states she will watch for pt and redraw.

## 2019-05-21 NOTE — H&P (Signed)
Chief Complaint: Patient was seen in consultation today for [facility] a cath placement at the request of Katragadda,Sreedhar  Referring Physician(s): Katragadda,Sreedhar  Supervising Physician: Daryll Brod  Patient Status: [facility] - Out-pt  History of Present Illness: Tasha Clarke is a 68 y.o. female   Normocytic anemia Iron deficient Periodic Iron infusions --- x 1 yr so far Poor venous access  Request made for [facility] placement per Dr Delton Coombes   Past Medical History:  Diagnosis Date  . Allergy   . Anemia   . Anxiety   . Arthritis   . Asthma   . Blood transfusion without reported diagnosis   . Cataract   . CHF (congestive heart failure) ([place])   . Chronic kidney disease   . Clotting disorder ([place])   . COPD (chronic obstructive pulmonary disease) ([place])   . Depression   . Diabetes mellitus without complication ([place])    pt states she is not diabetic   . Emphysema of lung ([place])   . GERD (gastroesophageal reflux disease)   . Hyperlipidemia   . Hypertension   . Neuromuscular disorder ([place])   . Osteoporosis   . Seizures ([place])   . Skin cancer    2 areas removed more than 10 years ago  . Sleep apnea 1985   wears cpap nightly  . Thyroid disease     Past Surgical History:  Procedure Laterality Date  . APPENDECTOMY    . BIOPSY  03/16/2018   Procedure: BIOPSY;  Surgeon: Daneil Dolin, MD;  Location: [facility] ENDO SU[facility];  Service: Endoscopy;;  gastric bx's  . BIOPSY  02/12/2019   Procedure: BIOPSY;  Surgeon: Daneil Dolin, MD;  Location: [facility];  Service: Endoscopy;;  gastric/pyloric channel  . CAROTID ENDARTERECTOMY Bilateral   . CERVICAL SPINE SURGERY     after neck fracture  . ESOPHAGOGASTRODUODENOSCOPY (EGD) WITH PROPOFOL N/A 03/16/2018   Procedure: ESOPHAGOGASTRODUODENOSCOPY (EGD) WITH PROPOFOL;  Surgeon: Daneil Dolin, MD;  Location: [facility];  Service: Endoscopy;  Laterality: N/A;  3:00pm  . ESOPHAGOGASTRODUODENOSCOPY (EGD) WITH PROPOFOL N/A  02/12/2019   Procedure: ESOPHAGOGASTRODUODENOSCOPY (EGD) WITH PROPOFOL;  Surgeon: Daneil Dolin, MD;  [facility];  Service: Endoscopy;  Laterality: N/A;  1:30pm  . EYE SURGERY    . FRACTURE SURGERY    . heart stents    . JOINT REPLACEMENT     Right knee  . MALONEY DILATION N/A 02/12/2019   Procedure: Venia Minks DILATION;  Surgeon: Daneil Dolin, MD;  Location[facility];  Service: Endoscopy;  Laterality: N/A;  . SPINAL CORD STIMULATOR IMPLANT  2015  . SPINE SURGERY     lumbar x4     Allergies: Cephalexin, Ketorolac tromethamine, [place] bov[place], Imdur  [isosorbide dinitrate], Iodinated diagnostic agents, Other, Sodium bicarbonate-citric acid, and Milk-related compounds  Medications: Prior to Admission medications   Medication Sig Start Date End Date Taking? Authorizing Provider  albuterol (PROVENTIL HFA) 108 (90 Base) MCG/ACT inhaler Inhale 1-2 puffs into the lungs every 4 (four) hours as needed for wheezing or shortness of breath.    Yes [provider]  alendronate (FOSAMAX) 70 MG tablet Take 1 tablet (70 mg total) by mouth every Monday. Take with a full glass of water on an empty stomach. 05/22/18  Yes Dettinger, Fransisca Kaufmann, MD  Cholecalciferol (VITAMIN D-3) 5000 units TABS Take 5,000 Units by mouth daily.   Yes [provider]  Cyanocobalamin (VITAMIN B-12 PO) Take 1 tablet by mouth daily.  Yes [provider]  cyanocobalamin 100 MCG tablet Take 100 mcg by mouth daily.    Yes [provider]  cyclobenzaprine (FLEXERIL) 10 MG tablet TAKE 1 TABLET BY MOUTH AT BEDTIME 04/30/19  Yes Dettinger, Fransisca Kaufmann, MD  dicyclomine (BENTYL) 10 MG capsule Take 1 capsule (10 mg total) by mouth 4 (four) times daily as needed (for diarrhea). 12/20/18  Yes Carlis Stable, NP  diphenhydramine-acetaminophen (TYLENOL PM EXTRA STRENGTH) 25-500 MG TABS tablet Take 1 tablet by mouth at bedtime as needed (pain).    Yes [provider]  DULoxetine (CYMBALTA) 30 MG  capsule Take 3 capsules (90 mg total) by mouth daily. 01/31/19  Yes Dettinger, Fransisca Kaufmann, MD  EQ ALLERGY RELIEF, CETIRIZINE, 10 MG tablet Take 1 tablet by mouth once daily 11/22/18  Yes Dettinger, Fransisca Kaufmann, MD  Ferrous Sulfate (IRON) 325 (65 Fe) MG TABS Take 1 tablet (325 mg total) by mouth daily. 08/27/17  Yes Velvet Bathe, MD  fluticasone (FLONASE) 50 MCG/ACT nasal spray Place 2 sprays into both nostrils daily. 05/17/18  Yes Dettinger, Fransisca Kaufmann, MD  Fluticasone-Umeclidin-Vilant (TRELEGY ELLIPTA) 100-62.5-25 MCG/INH AEPB Inhale into the lungs 1 day or 1 dose.   Yes [provider]  folic acid (FOLVITE) 1 MG tablet Take 1 mg by mouth daily.   Yes [provider]  gabapentin (NEURONTIN) 600 MG tablet Take 1 tablet (600 mg total) by mouth 4 (four) times daily. 07/26/18  Yes Dettinger, Fransisca Kaufmann, MD  ipratropium-albuterol (DUONEB) 0.5-2.5 (3) MG/3ML SOLN Take 3 mLs by nebulization every 6 (six) hours as needed (shortness of breathe). 03/15/19  Yes Dettinger, Fransisca Kaufmann, MD  levETIRAcetam (KEPPRA) 1000 MG tablet Take 1 tablet (1,000 mg total) by mouth at bedtime. 01/31/19  Yes Dettinger, Fransisca Kaufmann, MD  levETIRAcetam (KEPPRA) 750 MG tablet Take 1 tablet (750 mg total) by mouth every morning. 01/31/19  Yes Dettinger, Fransisca Kaufmann, MD  levothyroxine (SYNTHROID) 50 MCG tablet Take 1 tablet (50 mcg total) by mouth daily before breakfast. 01/31/19  Yes Dettinger, Fransisca Kaufmann, MD  Magnesium 400 MG CAPS Take 400 mg by mouth daily.   Yes [provider]  Melatonin 10 MG TABS Take 10 mg by mouth at bedtime as needed.    Yes [provider]  metoprolol tartrate (LOPRESSOR) 25 MG tablet Take 12.5 mg by mouth 2 (two) times daily.   Yes [provider]  Multiple Vitamin (MULTIVITAMIN) tablet Take 1 tablet by mouth daily.   Yes [provider]  Omega-3 Fatty Acids (FISH OIL) 1000 MG CAPS Take 1,000 mg by mouth daily.   Yes [provider]  pantoprazole (PROTONIX) 40 MG tablet  Take 40 mg by mouth 2 (two) times daily. 03/16/19  Yes [provider]  potassium chloride (K-DUR) 10 MEQ tablet Take 1 tablet by mouth daily.   Yes [provider]  QUEtiapine (SEROQUEL) 200 MG tablet Take 1 tablet (200 mg total) by mouth at bedtime. 04/16/19  Yes Dettinger, Fransisca Kaufmann, MD  rosuvastatin (CRESTOR) 40 MG tablet Take 1 tablet (40 mg total) by mouth daily. 08/28/18 05/17/19 Yes Minus Breeding, MD  traMADol (ULTRAM) 50 MG tablet Take 1 tablet by mouth every 8 (eight) hours as needed (pain).  04/17/19  Yes [provider]  EPINEPHrine 0.3 mg/0.3 mL IJ SOAJ injection Inject 0.3 mLs (0.3 mg total) into the muscle as needed. 01/31/19   Dettinger, Fransisca Kaufmann, MD  naloxone [facility]) nasal spray 4 mg/0.1 mL Place into the nose. 08/18/17  [provider]  ondansetron (ZOFRAN) 4 MG tablet TAKE 1 TABLET BY MOUTH 4 TIMES DAILY AS NEEDED 12/20/18   Carlis Stable, NP     Family History  Adopted: Yes  Problem Relation Age of Onset  . Arthritis Mother   . Asthma Mother   . Cancer Mother        lung  . Depression Mother   . Diabetes Mother   . Arthritis Father   . Asthma Father   . Depression Father   . Asthma Sister   . Depression Sister   . Diabetes Sister   . Arthritis Maternal Grandmother   . Asthma Maternal Grandmother   . Depression Maternal Grandmother   . Arthritis Maternal Grandfather   . Asthma Maternal Grandfather   . Arthritis Paternal Grandmother   . Asthma Paternal Grandmother   . Arthritis Paternal Grandfather   . Asthma Paternal Grandfather   . COPD Paternal Grandfather   . Diabetes Sister   . Mental illness Sister   . Anemia Sister   . Hypertension Son   . Post-traumatic stress disorder Son   . Allergies Daughter   . Breast cancer Neg Hx   . Seizures Neg Hx        not that she knows of, pt adopted    Social History   Socioeconomic History  . Marital status: Married    Spouse name: Not on file  . Number of children: 2  . Years of  education: 6  . Highest education level: Some college, no degree  Occupational History  . Occupation: Retired  Scientific laboratory technician  . Financial resource strain: Not hard at all  . Food insecurity    Worry: Never true    Inability: Never true  . Transportation needs    Medical: No    Non-medical: No  Tobacco Use  . Smoking status: Current Every Day Smoker    Packs/day: 1.00    Years: 43.00    Pack years: 43.00    Types: Cigarettes  . Smokeless tobacco: Never Used  Substance and Sexual Activity  . Alcohol use: Yes    Frequency: Never    Comment: occasionally, not even 1 drink per month   . Drug use: No  . Sexual activity: Not Currently    Birth control/protection: Post-menopausal  Lifestyle  . Physical activity    Days per week: 0 days    Minutes per session: 0 min  . Stress: To some extent  Relationships  . Social connections    Talks on phone: More than three times a week    Gets together: Three times a week    Attends religious service: Never    Active member of club or organization: No    Attends meetings of clubs or organizations: Never    Relationship status: Married  Other Topics Concern  . Not on file  Social History Narrative   Lives at home with husband and son   Right hand dominant   Caffeine: 4-5 cups daily (coffee & mtn dew)     Review of Systems: A 12 point ROS discussed and pertinent positives are indicated in the HPI above.  All other systems are negative.  Review of Systems  Constitutional: Positive for fatigue. Negative for activity change and fever.  Respiratory: Positive for cough and shortness of breath.   Cardiovascular: Negative for chest pain.  Neurological: Positive for weakness.  Psychiatric/Behavioral: Negative for behavioral problems and confusion.    Vital Signs: BP 131/68  Pulse 77   Temp 97.8 F (36.6 C) (Skin)   Resp 16   Ht 5\' 5"  (1.651 m)   Wt 215 lb (97.5 kg)   SpO2 100%   BMI 35.78 kg/m   Physical Exam Vitals signs  reviewed.  Cardiovascular:     Rate and Rhythm: Normal rate and regular rhythm.     Heart sounds: Normal heart sounds.  Pulmonary:     Breath sounds: Wheezing present.  Abdominal:     Palpations: Abdomen is soft.  Musculoskeletal: Normal range of motion.  Skin:    General: Skin is warm and dry.  Neurological:     Mental Status: She is alert and oriented to person, place, and time.  Psychiatric:        Mood and Affect: Mood normal.        Behavior: Behavior normal.        Thought Content: Thought content normal.        Judgment: Judgment normal.     Imaging: No results found.  Labs:  CBC: Recent Labs    07/26/18 0929 12/28/18 1338 01/23/19 1543 05/02/19 0850  WBC 7.5 7.7 9.2 6.4  HGB 11.0* 10.9* 12.5 10.8*  HCT 33.7* 36.5 41.6 36.4  PLT 308 306 308 251    COAGS: No results for input(s): INR, APTT in the last 8760 hours.  BMP: Recent Labs    07/26/18 0929 12/28/18 1338 01/23/19 1543 05/02/19 0850  NA 134 138 138 136  K 5.3* 3.7 3.7 4.5  CL 102 105 104 108  CO2 21 23 25  18*  GLUCOSE 89 119* 92 116*  BUN 23 23 15  32*  CALCIUM 9.7 8.6* 9.8 9.1  CREATININE 0.95 1.07* 1.02* 1.43*  GFRNONAA 63 54* 57* 38*  GFRAA 72 >60 >60 44*    LIVER FUNCTION TESTS: Recent Labs    07/26/18 0929 12/28/18 1338 01/23/19 1543 05/02/19 0850  BILITOT <0.2 0.3 0.2* 0.4  AST 22 21 21 19   ALT 19 13 14 19   ALKPHOS 70 56 58 68  PROT 6.5 7.0 7.2 7.2  ALBUMIN 3.7 3.8 3.9 4.1    TUMOR MARKERS: No results for input(s): AFPTM, CEA, CA199, CHROMGRNA in the last 8760 hours.  Assessment and Plan:  Normocytic anemia Requires iron infusion often Poor venous access Scheduled now for Port a cath placement Risks and benefits of image guided port-a-catheter placement was discussed with the patient including, but not limited to bleeding, infection, pneumothorax, or fibrin sheath development and need for additional procedures.  All of the patient's questions were answered,  patient is agreeable to proceed. Consent signed and in chart.   Thank you for this interesting consult.  I greatly enjoyed meeting Tasha Clarke and look forward to participating in their care.  A copy of this report was sent to the requesting provider on this date.  Electronically Signed: Lavonia Drafts, PA-C 05/21/2019, 9:34 AM   I spent a total of  30 Minutes   in face to face in clinical consultation, greater than 50% of which was counseling/coordinating care for [facility] placement

## 2019-05-21 NOTE — Discharge Instructions (Signed)

## 2019-05-21 NOTE — Procedures (Signed)
fe def anemia, access for Fe transfusions and blood transfusion  S/p RT IJ PORT Tip svcra No comp Stable ebl min Ready for use Full report in pacs

## 2019-05-23 ENCOUNTER — Encounter (HOSPITAL_COMMUNITY): Payer: Self-pay

## 2019-05-23 ENCOUNTER — Inpatient Hospital Stay (HOSPITAL_COMMUNITY): Payer: Medicare PPO

## 2019-05-23 ENCOUNTER — Other Ambulatory Visit: Payer: Self-pay

## 2019-05-23 VITALS — BP 133/84 | HR 80 | Temp 96.9°F | Resp 18

## 2019-05-23 DIAGNOSIS — D509 Iron deficiency anemia, unspecified: Secondary | ICD-10-CM | POA: Diagnosis not present

## 2019-05-23 DIAGNOSIS — D508 Other iron deficiency anemias: Secondary | ICD-10-CM

## 2019-05-23 MED ORDER — SODIUM CHLORIDE 0.9 % IV SOLN
Freq: Once | INTRAVENOUS | Status: AC
  Administered 2019-05-23: 15:00:00 via INTRAVENOUS

## 2019-05-23 MED ORDER — SODIUM CHLORIDE 0.9% FLUSH
10.0000 mL | Freq: Once | INTRAVENOUS | Status: AC
  Administered 2019-05-23: 10 mL

## 2019-05-23 MED ORDER — SODIUM CHLORIDE 0.9 % IV SOLN
510.0000 mg | Freq: Once | INTRAVENOUS | Status: AC
  Administered 2019-05-23: 510 mg via INTRAVENOUS
  Filled 2019-05-23: qty 510

## 2019-05-23 MED ORDER — HEPARIN SOD (PORK) LOCK FLUSH 100 UNIT/ML IV SOLN
500.0000 [IU] | Freq: Once | INTRAVENOUS | Status: AC
  Administered 2019-05-23: 500 [IU] via INTRAVENOUS

## 2019-05-23 NOTE — Patient Instructions (Signed)
[facility] at [facility]  Discharge Instructions:   _______________________________________________________________  Thank you for choosing [facility] at [facility] to provide your oncology and hematology care.  To afford each patient quality time with our providers, please arrive at least 15 minutes before your scheduled appointment.  You need to re-schedule your appointment if you arrive 10 or more minutes late.  We strive to give you quality time with our providers, and arriving late affects you and other patients whose appointments are after yours.  Also, if you no show three or more times for appointments you may be dismissed from the clinic.  Again, thank you for choosing [facility] at [facility]. Our hope is that these requests will allow you access to exceptional care and in a timely manner. _______________________________________________________________  If you have questions after your visit, please contact our office at [phone] between the hours of 8:30 a.m. and 5:00 p.m. Voicemails left after 4:30 p.m. will not be returned until the following business day. _______________________________________________________________  For prescription refill requests, have your pharmacy contact our office. _______________________________________________________________  Recommendations made by the consultant and any test results will be sent to your referring physician. _______________________________________________________________ 

## 2019-05-23 NOTE — Progress Notes (Signed)
Pt presents today for Feraheme infusion. MAR reviewed and updated. Vital signs within normal limits. Pt has no complaints of any changes since the last visit.   Feraheme given today per MD orders. Tolerated infusion without adverse affects. Vital signs stable. No complaints at this time. Discharged from clinic via wheel chair. F/U with [facility] as scheduled.

## 2019-05-24 ENCOUNTER — Encounter: Payer: Self-pay | Admitting: Family Medicine

## 2019-05-24 ENCOUNTER — Ambulatory Visit (INDEPENDENT_AMBULATORY_CARE_PROVIDER_SITE_OTHER): Payer: Medicare PPO | Admitting: Family Medicine

## 2019-05-24 DIAGNOSIS — F339 Major depressive disorder, recurrent, unspecified: Secondary | ICD-10-CM | POA: Diagnosis not present

## 2019-05-24 DIAGNOSIS — F419 Anxiety disorder, unspecified: Secondary | ICD-10-CM | POA: Diagnosis not present

## 2019-05-24 MED ORDER — QUETIAPINE FUMARATE 200 MG PO TABS: 200.0000 mg | ORAL_TABLET | Freq: Every day | ORAL | 1 refills | Status: DC

## 2019-05-24 NOTE — Progress Notes (Signed)
Virtual Visit via telephone Note  I connected with Tasha Clarke on 05/24/19 at 1015 by telephone and verified that I am speaking with the correct person using two identifiers. Tasha Clarke is currently located at home and husband are currently with her during visit. The provider, Fransisca Kaufmann Dettinger, MD is located in their office at time of visit.  Call ended at 1028  I discussed the limitations, risks, security and privacy concerns of performing an evaluation and management service by telephone and the availability of in person appointments. I also discussed with the patient that there may be a patient responsible charge related to this service. The patient expressed understanding and agreed to proceed.   History and Present Illness: Anxiety and depression We increased the seroquel and she has some improvement and she is sleeping and still having good days and bad days and sleeps better.  Her husband has been diagnosed with ALS and she had a port put in for iron infusions.  Patient denies any thoughts of hurting herself.    No diagnosis found.  Outpatient Encounter Medications as of 05/24/2019  Medication Sig  . albuterol (PROVENTIL HFA) 108 (90 Base) MCG/ACT inhaler Inhale 1-2 puffs into the lungs every 4 (four) hours as needed for wheezing or shortness of breath.   Marland Kitchen alendronate (FOSAMAX) 70 MG tablet Take 1 tablet (70 mg total) by mouth every Monday. Take with a full glass of water on an empty stomach.  . Cholecalciferol (VITAMIN D-3) 5000 units TABS Take 5,000 Units by mouth daily.  . Cyanocobalamin (VITAMIN B-12 PO) Take 1 tablet by mouth daily.   . cyanocobalamin 100 MCG tablet Take 100 mcg by mouth daily.   . cyclobenzaprine (FLEXERIL) 10 MG tablet TAKE 1 TABLET BY MOUTH AT BEDTIME  . dicyclomine (BENTYL) 10 MG capsule Take 1 capsule (10 mg total) by mouth 4 (four) times daily as needed (for diarrhea).  . diphenhydramine-acetaminophen (TYLENOL PM EXTRA  STRENGTH) 25-500 MG TABS tablet Take 1 tablet by mouth at bedtime as needed (pain).   . DULoxetine (CYMBALTA) 30 MG capsule Take 3 capsules (90 mg total) by mouth daily.  Marland Kitchen EPINEPHrine 0.3 mg/0.3 mL IJ SOAJ injection Inject 0.3 mLs (0.3 mg total) into the muscle as needed.  Noelle Penner ALLERGY RELIEF, CETIRIZINE, 10 MG tablet Take 1 tablet by mouth once daily  . Ferrous Sulfate (IRON) 325 (65 Fe) MG TABS Take 1 tablet (325 mg total) by mouth daily.  . fluticasone (FLONASE) 50 MCG/ACT nasal spray Place 2 sprays into both nostrils daily.  . Fluticasone-Umeclidin-Vilant (TRELEGY ELLIPTA) 100-62.5-25 MCG/INH AEPB Inhale into the lungs 1 day or 1 dose.  . folic acid (FOLVITE) 1 MG tablet Take 1 mg by mouth daily.  Marland Kitchen gabapentin (NEURONTIN) 600 MG tablet Take 1 tablet (600 mg total) by mouth 4 (four) times daily.  Marland Kitchen ipratropium-albuterol (DUONEB) 0.5-2.5 (3) MG/3ML SOLN Take 3 mLs by nebulization every 6 (six) hours as needed (shortness of breathe).  . levETIRAcetam (KEPPRA) 1000 MG tablet Take 1 tablet (1,000 mg total) by mouth at bedtime.  . levETIRAcetam (KEPPRA) 750 MG tablet Take 1 tablet (750 mg total) by mouth every morning.  Marland Kitchen levothyroxine (SYNTHROID) 50 MCG tablet Take 1 tablet (50 mcg total) by mouth daily before breakfast.  . Magnesium 400 MG CAPS Take 400 mg by mouth daily.  . Melatonin 10 MG TABS Take 10 mg by mouth at bedtime as needed.   . metoprolol tartrate (LOPRESSOR) 25 MG tablet Take 12.5  mg by mouth 2 (two) times daily.  . Multiple Vitamin (MULTIVITAMIN) tablet Take 1 tablet by mouth daily.  . naloxone (NARCAN) nasal spray 4 mg/0.1 mL Place into the nose.  . Omega-3 Fatty Acids (FISH OIL) 1000 MG CAPS Take 1,000 mg by mouth daily.  . ondansetron (ZOFRAN) 4 MG tablet TAKE 1 TABLET BY MOUTH 4 TIMES DAILY AS NEEDED  . pantoprazole (PROTONIX) 40 MG tablet Take 40 mg by mouth 2 (two) times daily.  . potassium chloride (K-DUR) 10 MEQ tablet Take 1 tablet by mouth daily.  . QUEtiapine  (SEROQUEL) 200 MG tablet Take 1 tablet (200 mg total) by mouth at bedtime.  . rosuvastatin (CRESTOR) 40 MG tablet Take 1 tablet (40 mg total) by mouth daily.  . traMADol (ULTRAM) 50 MG tablet Take 1 tablet by mouth every 8 (eight) hours as needed (pain).   . traMADol (ULTRAM) 50 MG tablet TAKE 1 TABLET BY MOUTH EVERY 8 HOURS AS NEEDED   No facility-administered encounter medications on file as of 05/24/2019.     Review of Systems  Constitutional: Negative for chills and fever.  Eyes: Negative for visual disturbance.  Respiratory: Negative for chest tightness and shortness of breath.   Cardiovascular: Negative for chest pain and leg swelling.  Musculoskeletal: Negative for back pain and gait problem.  Skin: Negative for rash.  Neurological: Negative for light-headedness and headaches.  Psychiatric/Behavioral: Positive for dysphoric mood. Negative for agitation, behavioral problems, self-injury, sleep disturbance and suicidal ideas. The patient is nervous/anxious.   All other systems reviewed and are negative.   Observations/Objective: Patient sounds comfortable and in no acute distress   Assessment and Plan: Problem List Items Addressed This Visit      Other   Depression, recurrent ([place])   Relevant Medications   QUEtiapine (SEROQUEL) 200 MG tablet   Anxiety   Relevant Medications   QUEtiapine (SEROQUEL) 200 MG tablet       Follow Up Instructions: Follow up in 3 months and continue current Seroquel an dcymbalta    I discussed the assessment and treatment plan with the patient. The patient was provided an opportunity to ask questions and all were answered. The patient agreed with the plan and demonstrated an understanding of the instructions.   The patient was advised to call back or seek an in-person evaluation if the symptoms worsen or if the condition fails to improve as anticipated.  The above assessment and management plan was discussed with the patient. The patient  verbalized understanding of and has agreed to the management plan. Patient is aware to call the clinic if symptoms persist or worsen. Patient is aware when to return to the clinic for a follow-up visit. Patient educated on when it is appropriate to go to the emergency department.    I provided 13 minutes of non-face-to-face time during this encounter.    Worthy Rancher, MD

## 2019-05-25 ENCOUNTER — Other Ambulatory Visit: Payer: Self-pay | Admitting: Family

## 2019-05-25 DIAGNOSIS — D649 Anemia, unspecified: Secondary | ICD-10-CM

## 2019-05-26 DIAGNOSIS — J449 Chronic obstructive pulmonary disease, unspecified: Secondary | ICD-10-CM | POA: Diagnosis not present

## 2019-05-28 ENCOUNTER — Inpatient Hospital Stay: Payer: Medicare PPO | Attending: Family | Admitting: Family

## 2019-05-28 ENCOUNTER — Other Ambulatory Visit: Payer: Self-pay

## 2019-05-28 ENCOUNTER — Inpatient Hospital Stay: Payer: Medicare PPO

## 2019-05-28 ENCOUNTER — Telehealth: Payer: Self-pay | Admitting: Family

## 2019-05-28 VITALS — BP 132/64 | HR 65 | Resp 17

## 2019-05-28 DIAGNOSIS — Z9981 Dependence on supplemental oxygen: Secondary | ICD-10-CM | POA: Diagnosis not present

## 2019-05-28 DIAGNOSIS — E039 Hypothyroidism, unspecified: Secondary | ICD-10-CM | POA: Diagnosis not present

## 2019-05-28 DIAGNOSIS — D649 Anemia, unspecified: Secondary | ICD-10-CM | POA: Diagnosis not present

## 2019-05-28 DIAGNOSIS — D509 Iron deficiency anemia, unspecified: Secondary | ICD-10-CM | POA: Diagnosis not present

## 2019-05-28 DIAGNOSIS — J449 Chronic obstructive pulmonary disease, unspecified: Secondary | ICD-10-CM | POA: Insufficient documentation

## 2019-05-28 DIAGNOSIS — Z7982 Long term (current) use of aspirin: Secondary | ICD-10-CM | POA: Insufficient documentation

## 2019-05-28 DIAGNOSIS — Z79899 Other long term (current) drug therapy: Secondary | ICD-10-CM | POA: Insufficient documentation

## 2019-05-28 DIAGNOSIS — Z95828 Presence of other vascular implants and grafts: Secondary | ICD-10-CM

## 2019-05-28 DIAGNOSIS — N189 Chronic kidney disease, unspecified: Secondary | ICD-10-CM | POA: Insufficient documentation

## 2019-05-28 LAB — CMP (CANCER CENTER ONLY)
ALT: 12 U/L (ref 0–44)
AST: 16 U/L (ref 15–41)
Albumin: 3.9 g/dL (ref 3.5–5.0)
Alkaline Phosphatase: 57 U/L (ref 38–126)
Anion gap: 9 (ref 5–15)
BUN: 36 mg/dL — ABNORMAL HIGH (ref 8–23)
CO2: 24 mmol/L (ref 22–32)
Calcium: 9.4 mg/dL (ref 8.9–10.3)
Chloride: 108 mmol/L (ref 98–111)
Creatinine: 1.66 mg/dL — ABNORMAL HIGH (ref 0.44–1.00)
GFR, Est AFR Am: 37 mL/min — ABNORMAL LOW (ref >60)
GFR, Estimated: 32 mL/min — ABNORMAL LOW (ref >60)
Glucose, Bld: 125 mg/dL — ABNORMAL HIGH (ref 70–99)
Potassium: 3.6 mmol/L (ref 3.5–5.1)
Sodium: 141 mmol/L (ref 135–145)
Total Bilirubin: 0.2 mg/dL — ABNORMAL LOW (ref 0.3–1.2)
Total Protein: 6.1 g/dL — ABNORMAL LOW (ref 6.5–8.1)

## 2019-05-28 LAB — RETICULOCYTES
Immature Retic Fract: 21.9 % — ABNORMAL HIGH (ref 2.3–15.9)
RBC.: 3.53 MIL/uL — ABNORMAL LOW (ref 3.87–5.11)
Retic Count, Absolute: 92.8 10*3/uL (ref 19.0–186.0)
Retic Ct Pct: 2.6 % (ref 0.4–3.1)

## 2019-05-28 LAB — CBC WITH DIFFERENTIAL (CANCER CENTER ONLY)
Abs Immature Granulocytes: 0.18 10*3/uL — ABNORMAL HIGH (ref 0.00–0.07)
Basophils Absolute: 0.1 10*3/uL (ref 0.0–0.1)
Basophils Relative: 1 %
Eosinophils Absolute: 0.3 10*3/uL (ref 0.0–0.5)
Eosinophils Relative: 3 %
HCT: 35.4 % — ABNORMAL LOW (ref 36.0–46.0)
Hemoglobin: 10.6 g/dL — ABNORMAL LOW (ref 12.0–15.0)
Immature Granulocytes: 2 %
Lymphocytes Relative: 16 %
Lymphs Abs: 1.6 10*3/uL (ref 0.7–4.0)
MCH: 29.4 pg (ref 26.0–34.0)
MCHC: 29.9 g/dL — ABNORMAL LOW (ref 30.0–36.0)
MCV: 98.3 fL (ref 80.0–100.0)
Monocytes Absolute: 0.7 10*3/uL (ref 0.1–1.0)
Monocytes Relative: 7 %
Neutro Abs: 7.2 10*3/uL (ref 1.7–7.7)
Neutrophils Relative %: 71 %
Platelet Count: 271 10*3/uL (ref 150–400)
RBC: 3.6 MIL/uL — ABNORMAL LOW (ref 3.87–5.11)
RDW: 18 % — ABNORMAL HIGH (ref 11.5–15.5)
WBC Count: 10 10*3/uL (ref 4.0–10.5)
nRBC: 0 % (ref 0.0–0.2)

## 2019-05-28 LAB — IRON AND TIBC
Iron: 85 ug/dL (ref 41–142)
Saturation Ratios: 30 % (ref 21–57)
TIBC: 288 ug/dL (ref 236–444)
UIBC: 203 ug/dL (ref 120–384)

## 2019-05-28 LAB — VITAMIN B12: Vitamin B-12: 612 pg/mL (ref 180–914)

## 2019-05-28 LAB — FERRITIN: Ferritin: 314 ng/mL — ABNORMAL HIGH (ref 11–307)

## 2019-05-28 MED ORDER — SODIUM CHLORIDE 0.9% FLUSH
10.0000 mL | INTRAVENOUS | Status: DC | PRN
  Administered 2019-05-28: 10 mL via INTRAVENOUS
  Filled 2019-05-28: qty 10

## 2019-05-28 MED ORDER — HEPARIN SOD (PORK) LOCK FLUSH 100 UNIT/ML IV SOLN
500.0000 [IU] | Freq: Once | INTRAVENOUS | Status: AC
  Administered 2019-05-28: 500 [IU] via INTRAVENOUS
  Filled 2019-05-28: qty 5

## 2019-05-28 NOTE — Progress Notes (Signed)
Hematology/Oncology Consultation   Name: Tasha Clarke      MRN: [id number]    Location: Room/bed info not found  Date: 05/28/2019 Time:11:19 AM   REFERRING PHYSICIAN: Derek Jack, MD  REASON FOR CONSULT:  Iron deficiency anemia    DIAGNOSIS: Iron deficiency anemia   HISTORY OF PRESENT ILLNESS: [name] is a very pleasant 68 yo caucasian female with a long history of iron deficiency anemia.  She has been transfused in the past and her last transfusion was around February 2019.  She previously a patient at [facility] but recently moved with her husband to [place] for his job. We are closer to home for her and happy to see her.  Her iron saturation was down to 14% and ferritin 56. Hgb today is 10.6 down from 12.0.  She had a port placed because she had poor venous access and was able to get IV iron last week at [facility].  She still has some fatigue, SOB and occasional episodes of palpitations.  She had a EGD in August that showed erosive esophagitis with critical appearing Schatzki's ring and a small hiatal hernia. She is now on Protonix. They will be doing a repeat EGD in December to re-evaluate.  He last colonoscopy was in 2012 which showed gastric erosions and esophagitis. They removed a polyp from both the transverse and sigmoid colon at that time.  She is on 2 L supplemental O2 24 hours a day but does states that she removes her O2 to go outside and smoke. She smokes 1 ppd.  No episodes of bleeding. She takes a baby aspirin daily and does bruise easily. No petechiae.  No diabetes. She does have hypothyroidism and takes synthroid daily.  She has history of skin cancer which was removed without any complications. She denies history of melanoma.  She was adopted and knows very little about her birth family but she does know that her mother had lung cancer.  She has been under a great deal of stress worrying about her sweet husband who was just diagnosed with ALS.  No  fever, chills, n/v, rash, dizziness, chest pain, abdominal pain or changes in bowel or bladder habits.  She has swelling in her feet and ankles that waxes and wanes. No falls or syncopal episodes to report.   She has numbness and tingling in her hands and feet that comes and goes.  She does not eat well and doesn't have much of an appetite. She states that she does hydrate well.  She does not do recreational drugs and rarely has an alcoholic beverage socially.   ROS: All other 10 point review of systems is negative.   PAST MEDICAL HISTORY:   Past Medical History:  Diagnosis Date  . Allergy   . Anemia   . Anxiety   . Arthritis   . Asthma   . Blood transfusion without reported diagnosis   . Cataract   . CHF (congestive heart failure) ([place])   . Chronic kidney disease   . Clotting disorder ([place])   . COPD (chronic obstructive pulmonary disease) ([place])   . Depression   . Diabetes mellitus without complication ([place])    pt states she is not diabetic   . Emphysema of lung ([place])   . GERD (gastroesophageal reflux disease)   . Hyperlipidemia   . Hypertension   . Neuromuscular disorder ([place])   . Osteoporosis   . Seizures ([place])   . Skin cancer    2 areas removed  more than 10 years ago  . Sleep apnea 1985   wears cpap nightly  . Thyroid disease     ALLERGIES: Allergies  Allergen Reactions  . Cephalexin Hives  . Ketorolac Tromethamine Hives  . Lac Bovis Nausea And Vomiting  . Imdur  [Isosorbide Dinitrate]   . Iodinated Diagnostic Agents   . Other Other (See Comments)    Alka seltzer causes blurred vision, fainting  . Sodium Bicarbonate-Citric Acid   . Milk-Related Compounds Nausea And Vomiting      MEDICATIONS:  Current Outpatient Medications on File Prior to Visit  Medication Sig Dispense Refill  . albuterol (PROVENTIL HFA) 108 (90 Base) MCG/ACT inhaler Inhale 1-2 puffs into the lungs every 4 (four) hours as needed for wheezing or shortness of breath.     Marland Kitchen alendronate  (FOSAMAX) 70 MG tablet Take 1 tablet (70 mg total) by mouth every Monday. Take with a full glass of water on an empty stomach. 13 tablet 3  . Cholecalciferol (VITAMIN D-3) 5000 units TABS Take 5,000 Units by mouth daily.    . Cyanocobalamin (VITAMIN B-12 PO) Take 1 tablet by mouth daily.     . cyanocobalamin 100 MCG tablet Take 100 mcg by mouth daily.     . cyclobenzaprine (FLEXERIL) 10 MG tablet TAKE 1 TABLET BY MOUTH AT BEDTIME 30 tablet 0  . dicyclomine (BENTYL) 10 MG capsule Take 1 capsule (10 mg total) by mouth 4 (four) times daily as needed (for diarrhea). 90 capsule 0  . diphenhydramine-acetaminophen (TYLENOL PM EXTRA STRENGTH) 25-500 MG TABS tablet Take 1 tablet by mouth at bedtime as needed (pain).     . DULoxetine (CYMBALTA) 30 MG capsule Take 3 capsules (90 mg total) by mouth daily. 90 capsule 3  . EPINEPHrine 0.3 mg/0.3 mL IJ SOAJ injection Inject 0.3 mLs (0.3 mg total) into the muscle as needed. 1 each 1  . EQ ALLERGY RELIEF, CETIRIZINE, 10 MG tablet Take 1 tablet by mouth once daily 90 tablet 0  . Ferrous Sulfate (IRON) 325 (65 Fe) MG TABS Take 1 tablet (325 mg total) by mouth daily. 90 each 0  . fluticasone (FLONASE) 50 MCG/ACT nasal spray Place 2 sprays into both nostrils daily. 16 g 2  . Fluticasone-Umeclidin-Vilant (TRELEGY ELLIPTA) 100-62.5-25 MCG/INH AEPB Inhale into the lungs 1 day or 1 dose.    . folic acid (FOLVITE) 1 MG tablet Take 1 mg by mouth daily.    Marland Kitchen gabapentin (NEURONTIN) 600 MG tablet Take 1 tablet (600 mg total) by mouth 4 (four) times daily. 360 tablet 3  . ipratropium-albuterol (DUONEB) 0.5-2.5 (3) MG/3ML SOLN Take 3 mLs by nebulization every 6 (six) hours as needed (shortness of breathe). 360 mL 1  . levETIRAcetam (KEPPRA) 1000 MG tablet Take 1 tablet (1,000 mg total) by mouth at bedtime. 90 tablet 3  . levETIRAcetam (KEPPRA) 750 MG tablet Take 1 tablet (750 mg total) by mouth every morning. 90 tablet 3  . levothyroxine (SYNTHROID) 50 MCG tablet Take 1 tablet  (50 mcg total) by mouth daily before breakfast. 90 tablet 3  . Magnesium 400 MG CAPS Take 400 mg by mouth daily.    . Melatonin 10 MG TABS Take 10 mg by mouth at bedtime as needed.     . metoprolol tartrate (LOPRESSOR) 25 MG tablet Take 12.5 mg by mouth 2 (two) times daily.    . Multiple Vitamin (MULTIVITAMIN) tablet Take 1 tablet by mouth daily.    . naloxone (NARCAN) nasal spray 4 mg/0.1 mL  Place into the nose.    . Omega-3 Fatty Acids (FISH OIL) 1000 MG CAPS Take 1,000 mg by mouth daily.    . ondansetron (ZOFRAN) 4 MG tablet TAKE 1 TABLET BY MOUTH 4 TIMES DAILY AS NEEDED 20 tablet 3  . pantoprazole (PROTONIX) 40 MG tablet Take 40 mg by mouth 2 (two) times daily.    . potassium chloride (K-DUR) 10 MEQ tablet Take 1 tablet by mouth daily.    . QUEtiapine (SEROQUEL) 200 MG tablet Take 1 tablet (200 mg total) by mouth at bedtime. 90 tablet 1  . rosuvastatin (CRESTOR) 40 MG tablet Take 1 tablet (40 mg total) by mouth daily. 90 tablet 3  . traMADol (ULTRAM) 50 MG tablet Take 1 tablet by mouth every 8 (eight) hours as needed (pain).     . traMADol (ULTRAM) 50 MG tablet TAKE 1 TABLET BY MOUTH EVERY 8 HOURS AS NEEDED     No current facility-administered medications on file prior to visit.      PAST SURGICAL HISTORY Past Surgical History:  Procedure Laterality Date  . APPENDECTOMY    . BIOPSY  03/16/2018   Procedure: BIOPSY;  Surgeon: Daneil Dolin, MD;  Location: [facility];  Service: Endoscopy;;  gastric bx's  . BIOPSY  02/12/2019   Procedure: BIOPSY;  Surgeon: Daneil Dolin, MD;  Location: [facility];  Service: Endoscopy;;  gastric/pyloric channel  . CAROTID ENDARTERECTOMY Bilateral   . CERVICAL SPINE SURGERY     after neck fracture  . ESOPHAGOGASTRODUODENOSCOPY (EGD) WITH PROPOFOL N/A 03/16/2018   Procedure: ESOPHAGOGASTRODUODENOSCOPY (EGD) WITH PROPOFOL;  Surgeon: Daneil Dolin, MD;  Location: [facility];  Service: Endoscopy;  Laterality: N/A;  3:00pm  .  ESOPHAGOGASTRODUODENOSCOPY (EGD) WITH PROPOFOL N/A 02/12/2019   Procedure: ESOPHAGOGASTRODUODENOSCOPY (EGD) WITH PROPOFOL;  Surgeon: Daneil Dolin, MD;  Location: [facility];  Service: Endoscopy;  Laterality: N/A;  1:30pm  . EYE SURGERY    . FRACTURE SURGERY    . heart stents    . IR IMAGING GUIDED PORT INSERTION  05/21/2019  . JOINT REPLACEMENT     Right knee  . MALONEY DILATION N/A 02/12/2019   Procedure: Venia Minks DILATION;  Surgeon: Daneil Dolin, MD;  Location: [facility];  Service: Endoscopy;  Laterality: N/A;  . SPINAL CORD STIMULATOR IMPLANT  2015  . SPINE SURGERY     lumbar x4     FAMILY HISTORY: Family History  Adopted: Yes  Problem Relation Age of Onset  . Arthritis Mother   . Asthma Mother   . Cancer Mother        lung  . Depression Mother   . Diabetes Mother   . Arthritis Father   . Asthma Father   . Depression Father   . Asthma Sister   . Depression Sister   . Diabetes Sister   . Arthritis Maternal Grandmother   . Asthma Maternal Grandmother   . Depression Maternal Grandmother   . Arthritis Maternal Grandfather   . Asthma Maternal Grandfather   . Arthritis Paternal Grandmother   . Asthma Paternal Grandmother   . Arthritis Paternal Grandfather   . Asthma Paternal Grandfather   . COPD Paternal Grandfather   . Diabetes Sister   . Mental illness Sister   . Anemia Sister   . Hypertension Son   . Post-traumatic stress disorder Son   . Allergies Daughter   . Breast cancer Neg Hx   . Seizures Neg Hx  not that she knows of, pt adopted    SOCIAL HISTORY:  reports that she has been smoking cigarettes. She has a 43.00 pack-year smoking history. She has never used smokeless tobacco. She reports current alcohol use. She reports that she does not use drugs.  PERFORMANCE STATUS: The patient's performance status is 1 - Symptomatic but completely ambulatory  PHYSICAL EXAM: Most Recent Vital Signs: There were no vitals taken for this visit. There were  no vitals taken for this visit.  General Appearance:    Alert, cooperative, no distress, appears stated age  Head:    Normocephalic, without obvious abnormality, atraumatic  Eyes:    PERRL, conjunctiva/corneas clear, EOM's intact, fundi    benign, both eyes        Throat:   Lips, mucosa, and tongue normal; teeth and gums normal  Neck:   Supple, symmetrical, trachea midline, no adenopathy;    thyroid:  no enlargement/tenderness/nodules; no carotid   bruit or JVD  Back:     Symmetric, no curvature, ROM normal, no CVA tenderness  Lungs:     Clear to auscultation bilaterally, respirations unlabored  Chest Wall:    No tenderness or deformity   Heart:    Regular rate and rhythm, S1 and S2 normal, no murmur, rub   or gallop     Abdomen:     Soft, non-tender, bowel sounds active all four quadrants,    no masses, no organomegaly        Extremities:   Extremities normal, atraumatic, no cyanosis or edema  Pulses:   2+ and symmetric all extremities  Skin:   Skin color, texture, turgor normal, no rashes or lesions  Lymph nodes:   Cervical, supraclavicular, and axillary nodes normal  Neurologic:   CNII-XII intact, normal strength, sensation and reflexes    throughout    LABORATORY DATA:  No results found for this or any previous visit (from the past 48 hour(s)).    RADIOGRAPHY: No results found.     PATHOLOGY: None  ASSESSMENT/PLAN: Ms. Demartini is a very pleasant 68 yo caucasian female with a long history of iron deficiency anemia. HGb is down to 10.6 from 12.0. We were able to check an erythropoietin level on her today. With her history of kidney insufficiency she may benefit from Aranesp or Retacrit if needed.  We will see what her iron studies look like and bring her back in for another infusion if needed.  We will go ahead and plan to see her back in another 6 weeks.   All questions were answered and she is in agreement with the plan. She will contact our office with any questions  or concerns. We can certainly see her sooner if needed.   She was discussed with and also seen by Dr. Marin Olp and he is in agreement with the aforementioned.   Laverna Peace, NP    ADDENDUM: I saw and examined the patient with Marcel Gary.  She is very nice.  She does have her other health issues.  She did receive IV iron.  She seems to be doing pretty well.  Her iron levels about 3 weeks ago were with a ferritin of 56 with an iron saturation of 14%.  I suspect that she probably does have some chronic bleeding.  Is probably low-grade.  Given that she is oxygen dependent, we really have to be aggressive with getting her hemoglobin up so that she will not have a lot of stress and a lot of dyspnea because  of anemia.  Overall, her performance status is not that great.  She comes in a wheelchair.  She apparently had an accident at work 35 years ago.  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  Her husband also has problems with respect to being diagnosed with ALS.  She does have some renal insufficiency.  She might need erythropoietin depending on what her erythropoietin level is.  I would like to get her back to see [place] in about 4 weeks or so.  Hopefully, will be able to keep her blood count up so she will be able to enjoy the holidays.  We spent about 45 minutes with Ms. Difiore and her husband today.  Lattie Haw, MD

## 2019-05-28 NOTE — Telephone Encounter (Signed)
Appointments scheduled calendar mailed per 11/16 los

## 2019-05-29 LAB — ERYTHROPOIETIN: Erythropoietin: 4.1 m[IU]/mL (ref 2.6–18.5)

## 2019-05-30 ENCOUNTER — Other Ambulatory Visit: Payer: Self-pay | Admitting: Hematology & Oncology

## 2019-05-30 ENCOUNTER — Encounter: Payer: Self-pay | Admitting: Hematology & Oncology

## 2019-05-30 DIAGNOSIS — D631 Anemia in chronic kidney disease: Secondary | ICD-10-CM

## 2019-05-30 HISTORY — DX: Anemia in chronic kidney disease: D63.1

## 2019-05-31 ENCOUNTER — Other Ambulatory Visit: Payer: Self-pay | Admitting: Family

## 2019-05-31 ENCOUNTER — Telehealth: Payer: Self-pay | Admitting: *Deleted

## 2019-05-31 NOTE — Telephone Encounter (Signed)
Message left to notify pt per order of S. [place] NP that "she will need the retacrit injection (red cell booster shot we talked about) next week.  Her epo was low but iron looks awesome!"  Instructed pt to call office back with any questions or concerns.

## 2019-05-31 NOTE — Telephone Encounter (Signed)
-----   Message from Eliezer Bottom, NP sent at 05/31/2019  1:04 PM EST ----- She will need the retacrit injection (red cell booster shot we talked about) next week. Her epo was low but iron looks awesome!   ----- Message ----- From: Volanda Napoleon, MD Sent: 05/30/2019   5:22 PM EST To: Eliezer Bottom, NP  She probable will benefit from Retacrit.  Please get her in for this next week.  Laurey Arrow ----- Message ----- From: Eliezer Bottom, NP Sent: 05/29/2019   4:00 PM EST To: Volanda Napoleon, MD  Check out her Epo level. Hgb was 10.6 she may qualify for Retacrit or Aranesp since she is less than 11. Iron studies were fine!  Sarah ----- Message ----- From: Buel Ream, Lab In [place] Sent: 05/28/2019  11:36 AM EST To: Eliezer Bottom, NP

## 2019-06-01 DIAGNOSIS — M533 Sacrococcygeal disorders, not elsewhere classified: Secondary | ICD-10-CM | POA: Diagnosis not present

## 2019-06-04 ENCOUNTER — Inpatient Hospital Stay: Payer: Medicare PPO

## 2019-06-04 ENCOUNTER — Other Ambulatory Visit: Payer: Self-pay

## 2019-06-04 VITALS — BP 108/64 | HR 77 | Temp 97.0°F | Resp 16

## 2019-06-04 DIAGNOSIS — D508 Other iron deficiency anemias: Secondary | ICD-10-CM

## 2019-06-04 DIAGNOSIS — J449 Chronic obstructive pulmonary disease, unspecified: Secondary | ICD-10-CM | POA: Diagnosis not present

## 2019-06-04 DIAGNOSIS — Z7982 Long term (current) use of aspirin: Secondary | ICD-10-CM | POA: Diagnosis not present

## 2019-06-04 DIAGNOSIS — Z9981 Dependence on supplemental oxygen: Secondary | ICD-10-CM | POA: Diagnosis not present

## 2019-06-04 DIAGNOSIS — N189 Chronic kidney disease, unspecified: Secondary | ICD-10-CM | POA: Diagnosis not present

## 2019-06-04 DIAGNOSIS — D649 Anemia, unspecified: Secondary | ICD-10-CM | POA: Diagnosis not present

## 2019-06-04 DIAGNOSIS — E039 Hypothyroidism, unspecified: Secondary | ICD-10-CM | POA: Diagnosis not present

## 2019-06-04 DIAGNOSIS — Z79899 Other long term (current) drug therapy: Secondary | ICD-10-CM | POA: Diagnosis not present

## 2019-06-04 MED ORDER — EPOETIN ALFA-EPBX 40000 UNIT/ML IJ SOLN
40000.0000 [IU] | Freq: Once | INTRAMUSCULAR | Status: AC
  Administered 2019-06-04: 40000 [IU] via SUBCUTANEOUS

## 2019-06-04 MED ORDER — EPOETIN ALFA-EPBX 40000 UNIT/ML IJ SOLN
INTRAMUSCULAR | Status: AC
  Filled 2019-06-04: qty 1

## 2019-06-04 NOTE — Patient Instructions (Signed)

## 2019-06-05 ENCOUNTER — Other Ambulatory Visit: Payer: Self-pay | Admitting: Family Medicine

## 2019-06-13 ENCOUNTER — Other Ambulatory Visit: Payer: Self-pay | Admitting: Family Medicine

## 2019-06-13 MED ORDER — CETIRIZINE HCL 10 MG PO TABS: 10.0000 mg | ORAL_TABLET | Freq: Every day | ORAL | 0 refills | Status: AC

## 2019-06-15 ENCOUNTER — Other Ambulatory Visit: Payer: Self-pay | Admitting: Family Medicine

## 2019-06-25 DIAGNOSIS — J449 Chronic obstructive pulmonary disease, unspecified: Secondary | ICD-10-CM | POA: Diagnosis not present

## 2019-06-29 ENCOUNTER — Other Ambulatory Visit (HOSPITAL_COMMUNITY)
Admission: RE | Admit: 2019-06-29 | Discharge: 2019-06-29 | Disposition: A | Discharge status: Home / Self Care | Payer: Medicare PPO | Source: Ambulatory Visit | Attending: Internal Medicine | Admitting: Internal Medicine

## 2019-06-29 ENCOUNTER — Encounter (HOSPITAL_COMMUNITY): Admission: RE | Admit: 2019-06-29 | Payer: Medicare PPO | Source: Ambulatory Visit

## 2019-06-29 ENCOUNTER — Other Ambulatory Visit: Payer: Self-pay

## 2019-06-29 ENCOUNTER — Other Ambulatory Visit (HOSPITAL_COMMUNITY): Payer: Medicare PPO

## 2019-06-29 DIAGNOSIS — Z20828 Contact with and (suspected) exposure to other viral communicable diseases: Secondary | ICD-10-CM | POA: Diagnosis not present

## 2019-06-29 DIAGNOSIS — Z01812 Encounter for preprocedural laboratory examination: Secondary | ICD-10-CM | POA: Insufficient documentation

## 2019-06-29 LAB — SARS CORONAVIRUS 2 (TAT 6-24 HRS): SARS Coronavirus 2: NEGATIVE

## 2019-07-02 ENCOUNTER — Ambulatory Visit (HOSPITAL_COMMUNITY)
Admission: RE | Admit: 2019-07-02 | Discharge: 2019-07-02 | Disposition: A | Discharge status: Home / Self Care | Payer: Medicare PPO | Attending: Internal Medicine | Admitting: Internal Medicine

## 2019-07-02 ENCOUNTER — Ambulatory Visit (HOSPITAL_COMMUNITY): Payer: Medicare PPO | Admitting: Anesthesiology

## 2019-07-02 ENCOUNTER — Encounter (HOSPITAL_COMMUNITY): Payer: Self-pay | Admitting: Internal Medicine

## 2019-07-02 ENCOUNTER — Other Ambulatory Visit: Payer: Self-pay

## 2019-07-02 ENCOUNTER — Encounter (HOSPITAL_COMMUNITY)
Admission: RE | Disposition: A | Discharge status: Home / Self Care | Payer: Self-pay | Source: Home / Self Care | Attending: Internal Medicine

## 2019-07-02 DIAGNOSIS — K219 Gastro-esophageal reflux disease without esophagitis: Secondary | ICD-10-CM | POA: Insufficient documentation

## 2019-07-02 DIAGNOSIS — F419 Anxiety disorder, unspecified: Secondary | ICD-10-CM | POA: Diagnosis not present

## 2019-07-02 DIAGNOSIS — Z7951 Long term (current) use of inhaled steroids: Secondary | ICD-10-CM | POA: Diagnosis not present

## 2019-07-02 DIAGNOSIS — Z888 Allergy status to other drugs, medicaments and biological substances status: Secondary | ICD-10-CM | POA: Diagnosis not present

## 2019-07-02 DIAGNOSIS — R1319 Other dysphagia: Secondary | ICD-10-CM

## 2019-07-02 DIAGNOSIS — Z7983 Long term (current) use of bisphosphonates: Secondary | ICD-10-CM | POA: Diagnosis not present

## 2019-07-02 DIAGNOSIS — K3189 Other diseases of stomach and duodenum: Secondary | ICD-10-CM | POA: Insufficient documentation

## 2019-07-02 DIAGNOSIS — M1990 Unspecified osteoarthritis, unspecified site: Secondary | ICD-10-CM | POA: Insufficient documentation

## 2019-07-02 DIAGNOSIS — F17210 Nicotine dependence, cigarettes, uncomplicated: Secondary | ICD-10-CM | POA: Insufficient documentation

## 2019-07-02 DIAGNOSIS — Z8711 Personal history of peptic ulcer disease: Secondary | ICD-10-CM | POA: Diagnosis not present

## 2019-07-02 DIAGNOSIS — Z79890 Hormone replacement therapy (postmenopausal): Secondary | ICD-10-CM | POA: Diagnosis not present

## 2019-07-02 DIAGNOSIS — E785 Hyperlipidemia, unspecified: Secondary | ICD-10-CM | POA: Diagnosis not present

## 2019-07-02 DIAGNOSIS — J439 Emphysema, unspecified: Secondary | ICD-10-CM | POA: Insufficient documentation

## 2019-07-02 DIAGNOSIS — I509 Heart failure, unspecified: Secondary | ICD-10-CM | POA: Diagnosis not present

## 2019-07-02 DIAGNOSIS — N189 Chronic kidney disease, unspecified: Secondary | ICD-10-CM | POA: Insufficient documentation

## 2019-07-02 DIAGNOSIS — I130 Hypertensive heart and chronic kidney disease with heart failure and stage 1 through stage 4 chronic kidney disease, or unspecified chronic kidney disease: Secondary | ICD-10-CM | POA: Insufficient documentation

## 2019-07-02 DIAGNOSIS — K279 Peptic ulcer, site unspecified, unspecified as acute or chronic, without hemorrhage or perforation: Secondary | ICD-10-CM

## 2019-07-02 DIAGNOSIS — D631 Anemia in chronic kidney disease: Secondary | ICD-10-CM | POA: Diagnosis not present

## 2019-07-02 DIAGNOSIS — J449 Chronic obstructive pulmonary disease, unspecified: Secondary | ICD-10-CM | POA: Diagnosis not present

## 2019-07-02 DIAGNOSIS — G4730 Sleep apnea, unspecified: Secondary | ICD-10-CM | POA: Diagnosis not present

## 2019-07-02 DIAGNOSIS — R1310 Dysphagia, unspecified: Secondary | ICD-10-CM | POA: Diagnosis not present

## 2019-07-02 DIAGNOSIS — Z881 Allergy status to other antibiotic agents status: Secondary | ICD-10-CM | POA: Insufficient documentation

## 2019-07-02 DIAGNOSIS — E1122 Type 2 diabetes mellitus with diabetic chronic kidney disease: Secondary | ICD-10-CM | POA: Diagnosis not present

## 2019-07-02 DIAGNOSIS — K222 Esophageal obstruction: Secondary | ICD-10-CM

## 2019-07-02 DIAGNOSIS — I110 Hypertensive heart disease with heart failure: Secondary | ICD-10-CM | POA: Diagnosis not present

## 2019-07-02 HISTORY — PX: BIOPSY: SHX5522

## 2019-07-02 HISTORY — PX: ESOPHAGOGASTRODUODENOSCOPY (EGD) WITH PROPOFOL: SHX5813

## 2019-07-02 SURGERY — ESOPHAGOGASTRODUODENOSCOPY (EGD) WITH PROPOFOL
Anesthesia: General

## 2019-07-02 MED ORDER — KETAMINE HCL 50 MG/5ML IJ SOSY
PREFILLED_SYRINGE | INTRAMUSCULAR | Status: AC
  Filled 2019-07-02: qty 5

## 2019-07-02 MED ORDER — HYDROMORPHONE HCL 1 MG/ML IJ SOLN: 0.2500 mg | INTRAMUSCULAR | Status: DC | PRN

## 2019-07-02 MED ORDER — PROPOFOL 10 MG/ML IV BOLUS
INTRAVENOUS | Status: DC | PRN
  Administered 2019-07-02 (×2): 20 mg via INTRAVENOUS

## 2019-07-02 MED ORDER — MIDAZOLAM HCL 2 MG/2ML IJ SOLN: 0.5000 mg | Freq: Once | INTRAMUSCULAR | Status: DC | PRN

## 2019-07-02 MED ORDER — PROPOFOL 500 MG/50ML IV EMUL
INTRAVENOUS | Status: DC | PRN
  Administered 2019-07-02: 150 ug/kg/min via INTRAVENOUS

## 2019-07-02 MED ORDER — LACTATED RINGERS IV SOLN: INTRAVENOUS | Status: DC

## 2019-07-02 MED ORDER — KETAMINE HCL 10 MG/ML IJ SOLN
INTRAMUSCULAR | Status: DC | PRN
  Administered 2019-07-02: 20 mg via INTRAVENOUS

## 2019-07-02 MED ORDER — CHLORHEXIDINE GLUCONATE CLOTH 2 % EX PADS: 6.0000 | MEDICATED_PAD | Freq: Once | CUTANEOUS | Status: DC

## 2019-07-02 MED ORDER — HYDROCODONE-ACETAMINOPHEN 7.5-325 MG PO TABS: 1.0000 | ORAL_TABLET | Freq: Once | ORAL | Status: DC | PRN

## 2019-07-02 MED ORDER — PROMETHAZINE HCL 25 MG/ML IJ SOLN: 6.2500 mg | INTRAMUSCULAR | Status: DC | PRN

## 2019-07-02 MED ORDER — PROPOFOL 10 MG/ML IV BOLUS
INTRAVENOUS | Status: AC
  Filled 2019-07-02: qty 20

## 2019-07-02 MED ORDER — LIDOCAINE HCL 1 % IJ SOLN
INTRAMUSCULAR | Status: DC | PRN
  Administered 2019-07-02: 60 mg via INTRADERMAL

## 2019-07-02 NOTE — H&P (Signed)
@LOGO @   Primary Care Physician:  Dettinger, Fransisca Kaufmann, MD Primary Gastroenterologist:  Dr.   Pre-Procedure History & Physical: HPI:  Tasha Clarke is a 68 y.o. female here for   Past Medical History:  Diagnosis Date  . Allergy   . Anemia   . Anxiety   . Arthritis   . Asthma   . Blood transfusion without reported diagnosis   . Cataract   . CHF (congestive heart failure) ([place])   . Chronic kidney disease   . Clotting disorder ([place])   . COPD (chronic obstructive pulmonary disease) ([place])   . Depression   . Diabetes mellitus without complication ([place])    pt states she is not diabetic   . Emphysema of lung ([place])   . 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 GERD (gastroesophageal reflux disease)   . Hyperlipidemia   . Hypertension   . Neuromuscular disorder ([place])   . Osteoporosis   . Seizures ([place])   . Skin cancer    2 areas removed more than 10 years ago  . Sleep apnea 1985   wears cpap nightly  . Thyroid disease     Past Surgical History:  Procedure Laterality Date  . APPENDECTOMY    . BIOPSY  03/16/2018   Procedure: BIOPSY;  Surgeon: Daneil Dolin, MD;  [facility];  Service: Endoscopy;;  gastric bx's  . BIOPSY  02/12/2019   Procedure: BIOPSY;  Surgeon: Daneil Dolin, MD;  [facility];  Service: Endoscopy;;  gastric/pyloric channel  . CAROTID ENDARTERECTOMY Bilateral   . CERVICAL SPINE SURGERY     after neck fracture  . ESOPHAGOGASTRODUODENOSCOPY (EGD) WITH PROPOFOL N/A 03/16/2018   Procedure: ESOPHAGOGASTRODUODENOSCOPY (EGD) WITH PROPOFOL;  Surgeon: Daneil Dolin, MD;  [facility];  Service: Endoscopy;  Laterality: N/A;  3:00pm  . ESOPHAGOGASTRODUODENOSCOPY (EGD) WITH PROPOFOL N/A 02/12/2019   Procedure: ESOPHAGOGASTRODUODENOSCOPY (EGD) WITH PROPOFOL;  Surgeon: Daneil Dolin, MD;  [facility];  Service: Endoscopy;  Laterality: N/A;  1:30pm  . EYE SURGERY    . FRACTURE SURGERY    . heart stents    .  IR IMAGING GUIDED PORT INSERTION  05/21/2019  . JOINT REPLACEMENT     Right knee  . MALONEY DILATION N/A 02/12/2019   Procedure: Venia Minks DILATION;  Surgeon: Daneil Dolin, MD;  [facility];  Service: Endoscopy;  Laterality: N/A;  . SPINAL CORD STIMULATOR IMPLANT  2015  . SPINE SURGERY     lumbar x4     Prior to Admission medications   Medication Sig Start Date End Date Taking? Authorizing Provider  albuterol (PROVENTIL HFA) 108 (90 Base) MCG/ACT inhaler Inhale 1-2 puffs into the lungs every 4 (four) hours as needed for wheezing or shortness of breath.    Yes [provider]  alendronate (FOSAMAX) 70 MG tablet Take 1 tablet (70 mg total) by mouth every Monday. Take with a full glass of water on an empty stomach. 05/22/18  Yes Dettinger, Fransisca Kaufmann, MD  buprenorphine (BUTRANS) 20 MCG/HR PTWK patch Place 1 patch onto the skin once a week. Sunday 05/17/19  Yes [provider]  cetirizine (EQ ALLERGY RELIEF, CETIRIZINE,) 10 MG tablet Take 1 tablet (10 mg total) by mouth daily. 06/13/19  Yes Dettinger, Fransisca Kaufmann, MD  Cholecalciferol (VITAMIN D-3) 5000 units TABS Take 5,000 Units by mouth daily.   Yes [provider]  Cyanocobalamin (VITAMIN B-12 PO) Take 1 tablet by mouth daily.  Yes [provider]  cyanocobalamin 100 MCG tablet Take 100 mcg by mouth daily.    Yes [provider]  cyclobenzaprine (FLEXERIL) 10 MG tablet TAKE 1 TABLET BY MOUTH AT BEDTIME Patient taking differently: Take 10 mg by mouth at bedtime.  06/05/19  Yes Dettinger, Fransisca Kaufmann, MD  dicyclomine (BENTYL) 10 MG capsule Take 1 capsule (10 mg total) by mouth 4 (four) times daily as needed (for diarrhea). 12/20/18  Yes Carlis Stable, NP  DULoxetine (CYMBALTA) 30 MG capsule Take 3 capsules (90 mg total) by mouth daily. 01/31/19  Yes Dettinger, Fransisca Kaufmann, MD  EPINEPHrine 0.3 mg/0.3 mL IJ SOAJ injection Inject 0.3 mLs (0.3 mg total) into the muscle as needed. Patient taking differently:  Inject 0.3 mg into the muscle as needed for anaphylaxis.  01/31/19  Yes Dettinger, Fransisca Kaufmann, MD  Ferrous Sulfate (IRON) 325 (65 Fe) MG TABS Take 1 tablet (325 mg total) by mouth daily. Patient taking differently: Take 325 mg by mouth daily.  08/27/17  Yes Velvet Bathe, MD  fluticasone (FLONASE) 50 MCG/ACT nasal spray Place 2 sprays into both nostrils daily. Patient taking differently: Place 2 sprays into both nostrils 2 (two) times daily.  05/17/18  Yes Dettinger, Fransisca Kaufmann, MD  Fluticasone-Umeclidin-Vilant (TRELEGY ELLIPTA) 100-62.5-25 MCG/INH AEPB Inhale 1 puff into the lungs 1 day or 1 dose.    Yes [provider]  folic acid (FOLVITE) 1 MG tablet Take 1 mg by mouth daily.   Yes [provider]  gabapentin (NEURONTIN) 600 MG tablet Take 1 tablet (600 mg total) by mouth 4 (four) times daily. 07/26/18  Yes Dettinger, Fransisca Kaufmann, MD  hydrOXYzine (ATARAX/VISTARIL) 25 MG tablet Take 25 mg by mouth 3 (three) times daily as needed. 05/06/19  Yes [provider]  ipratropium-albuterol (DUONEB) 0.5-2.5 (3) MG/3ML SOLN Take 3 mLs by nebulization every 6 (six) hours as needed (shortness of breathe). 03/15/19  Yes Dettinger, Fransisca Kaufmann, MD  levETIRAcetam (KEPPRA) 1000 MG tablet Take 1 tablet (1,000 mg total) by mouth at bedtime. 01/31/19  Yes Dettinger, Fransisca Kaufmann, MD  levETIRAcetam (KEPPRA) 750 MG tablet Take 1 tablet (750 mg total) by mouth every morning. 01/31/19  Yes Dettinger, Fransisca Kaufmann, MD  levothyroxine (SYNTHROID) 50 MCG tablet Take 1 tablet (50 mcg total) by mouth daily before breakfast. 01/31/19  Yes Dettinger, Fransisca Kaufmann, MD  Magnesium 400 MG CAPS Take 400 mg by mouth daily.   Yes [provider]  Melatonin 10 MG TABS Take 10 mg by mouth at bedtime.    Yes [provider]  metoprolol tartrate (LOPRESSOR) 25 MG tablet Take 12.5 mg by mouth 2 (two) times daily.   Yes [provider]  Multiple Vitamin (MULTIVITAMIN) tablet Take 1 tablet by mouth daily.   Yes  [provider]  naloxone (NARCAN) nasal spray 4 mg/0.1 mL Place 0.4 mg into the nose once.  08/18/17  Yes [provider]  Omega-3 Fatty Acids (FISH OIL) 1000 MG CAPS Take 1,000 mg by mouth daily.   Yes [provider]  ondansetron (ZOFRAN) 4 MG tablet TAKE 1 TABLET BY MOUTH 4 TIMES DAILY AS NEEDED Patient taking differently: Take 4 mg by mouth 4 (four) times daily as needed for nausea or vomiting.  12/20/18  Yes Carlis Stable, NP  pantoprazole (PROTONIX) 40 MG tablet Take 40 mg by mouth 2 (two) times daily. 03/16/19  Yes [provider]  potassium chloride (K-DUR) 10 MEQ tablet Take 10 mEq by mouth daily.  Yes [provider]  QUEtiapine (SEROQUEL) 200 MG tablet Take 1 tablet (200 mg total) by mouth at bedtime. 05/24/19  Yes Dettinger, Fransisca Kaufmann, MD  rosuvastatin (CRESTOR) 40 MG tablet Take 1 tablet (40 mg total) by mouth daily. 08/28/18 06/25/19 Yes Minus Breeding, MD  traMADol (ULTRAM) 50 MG tablet Take 50 mg by mouth every 8 (eight) hours as needed (pain).  04/17/19  Yes [provider]  diphenhydramine-acetaminophen (TYLENOL PM EXTRA STRENGTH) 25-500 MG TABS tablet Take 2 tablets by mouth at bedtime.     [provider]    Allergies as of 04/18/2019 - Review Complete 04/18/2019  Allergen Reaction Noted  . Cephalexin Hives 01/24/2013  . Ketorolac tromethamine Hives 07/26/2017  . Lac bovis Nausea And Vomiting 05/28/2015  . Imdur  [isosorbide dinitrate]    . Iodinated diagnostic agents  05/28/2015  . Other Other (See Comments) 01/24/2013  . Sodium bicarbonate-citric acid  08/24/2017  . Milk-related compounds Nausea And Vomiting 05/28/2015    Family History  Adopted: Yes  Problem Relation Age of Onset  . Arthritis Mother   . Asthma Mother   . Cancer Mother        lung  . Depression Mother   . Diabetes Mother   . Arthritis Father   . Asthma Father   . Depression Father   . Asthma Sister   . Depression Sister   . Diabetes  Sister   . Arthritis Maternal Grandmother   . Asthma Maternal Grandmother   . Depression Maternal Grandmother   . Arthritis Maternal Grandfather   . Asthma Maternal Grandfather   . Arthritis Paternal Grandmother   . Asthma Paternal Grandmother   . Arthritis Paternal Grandfather   . Asthma Paternal Grandfather   . COPD Paternal Grandfather   . Diabetes Sister   . Mental illness Sister   . Anemia Sister   . Hypertension Son   . Post-traumatic stress disorder Son   . Allergies Daughter   . Breast cancer Neg Hx   . Seizures Neg Hx        not that she knows of, pt adopted    Social History   Socioeconomic History  . Marital status: Married    Spouse name: Not on file  . Number of children: 2  . Years of education: 45  . Highest education level: Some college, no degree  Occupational History  . Occupation: Retired  Tobacco Use  . Smoking status: Current Every Day Smoker    Packs/day: 1.00    Years: 43.00    Pack years: 43.00    Types: Cigarettes  . Smokeless tobacco: Never Used  Substance and Sexual Activity  . Alcohol use: Yes    Comment: occasionally, not even 1 drink per month   . Drug use: No  . Sexual activity: Not Currently    Birth control/protection: Post-menopausal  Other Topics Concern  . Not on file  Social History Narrative   Lives at home with husband and son   Right hand dominant   Caffeine: 4-5 cups daily (coffee & mtn dew)   Social Determinants of Health   Financial Resource Strain: Low Risk   . Difficulty of Paying Living Expenses: Not hard at all  Food Insecurity: No Food Insecurity  . Worried About Charity fundraiser in the Last Year: Never true  . Ran Out of Food in the Last Year: Never true  Transportation Needs: No Transportation Needs  . Lack of Transportation (Medical): No  .  Lack of Transportation (Non-Medical): No  Physical Activity: Inactive  . Days of Exercise per Week: 0 days  . Minutes of Exercise per Session: 0 min  Stress:  Stress Concern Present  . Feeling of Stress : To some extent  Social Connections: Somewhat Isolated  . Frequency of Communication with Friends and Family: More than three times a week  . Frequency of Social Gatherings with Friends and Family: Three times a week  . Attends Religious Services: Never  . Active Member of Clubs or Organizations: No  . Attends Archivist Meetings: Never  . Marital Status: Married  Human resources officer Violence: Not At Risk  . Fear of Current or Ex-Partner: No  . Emotionally Abused: No  . Physically Abused: No  . Sexually Abused: No    Review of Systems: See HPI, otherwise negative ROS  Physical Exam: BP (!) 150/58   Pulse 76   Temp 99.1 F (37.3 C) (Oral)   Resp 16   Ht 5\' 5"  (1.651 m)   Wt 95.3 kg   SpO2 96%   BMI 34.95 kg/m  General:   Alert,  Well-developed, well-nourished, pleasant and cooperative in NAD Mouth:  No deformity or lesions. Neck:  Supple; no masses or thyromegaly. No significant cervical adenopathy. Lungs:  Clear throughout to auscultation.   No wheezes, crackles, or rhonchi. No acute distress. Heart:  Regular rate and rhythm; no murmurs, clicks, rubs,  or gallops. Abdomen: Non-distended, normal bowel sounds.  Soft and nontender without appreciable mass or hepatosplenomegaly.  Pulses:  Normal pulses noted. Extremities:  Without clubbing or edema.  Impression/Plan: 68 year old lady with a history of gastric ulceration felt to be secondary to NSAIDs; here for surveillance exam to verify healing.  Still with some dysphagia after having her Schatzki's ring dilated earlier in the year. Will offer repeat dilation as feasible/appropriate today.  Denies NSAID use. The risks, benefits, limitations, alternatives and imponderables have been reviewed with the patient. Potential for esophageal dilation, biopsy, etc. have also been reviewed.  Questions have been answered. All parties agreeable.     Notice: This dictation was prepared  with Dragon dictation along with smaller phrase technology. Any transcriptional errors that result from this process are unintentional and may not be corrected upon review.

## 2019-07-02 NOTE — Anesthesia Postprocedure Evaluation (Signed)
Anesthesia Post Note  Patient: Tasha Clarke  Procedure(s) Performed: ESOPHAGOGASTRODUODENOSCOPY (EGD) WITH PROPOFOL (N/A ) BIOPSY  Patient location during evaluation: PACU Anesthesia Type: General Level of consciousness: awake and alert and oriented Pain management: pain level controlled Vital Signs Assessment: post-procedure vital signs reviewed and stable Respiratory status: spontaneous breathing Cardiovascular status: blood pressure returned to baseline and stable Postop Assessment: no apparent nausea or vomiting Anesthetic complications: no     Last Vitals:  Vitals:   07/02/19 1108 07/02/19 1300  BP: (!) 150/58 (!) 142/92  Pulse:    Resp:  16  Temp:  36.8 C  SpO2:  100%    Last Pain:  Vitals:   07/02/19 1105  TempSrc: Oral  PainSc: 0-No pain                 [name]

## 2019-07-02 NOTE — Transfer of Care (Signed)
Immediate Anesthesia Transfer of Care Note  Patient: Tasha Clarke  Procedure(s) Performed: ESOPHAGOGASTRODUODENOSCOPY (EGD) WITH PROPOFOL (N/A ) BIOPSY  Patient Location: PACU  Anesthesia Type:General  Level of Consciousness: awake  Airway & Oxygen Therapy: Patient Spontanous Breathing  Post-op Assessment: Report given to RN  Post vital signs: Reviewed  Last Vitals:  Vitals Value Taken Time  BP    Temp    Pulse    Resp    SpO2      Last Pain:  Vitals:   07/02/19 1105  TempSrc: Oral  PainSc: 0-No pain      Patients Stated Pain Goal: 7 ([id number])  Complications: No apparent anesthesia complications

## 2019-07-02 NOTE — Discharge Instructions (Signed)
EGD Discharge instructions Please read the instructions outlined below and refer to this sheet in the next few weeks. These discharge instructions provide you with general information on caring for yourself after you leave the hospital. Your doctor may also give you specific instructions. While your treatment has been planned according to the most current medical practices available, unavoidable complications occasionally occur. If you have any problems or questions after discharge, please call your doctor. ACTIVITY  You may resume your regular activity but move at a slower pace for the next 24 hours.   Take frequent rest periods for the next 24 hours.   Walking will help expel (get rid of) the air and reduce the bloated feeling in your abdomen.   No driving for 24 hours (because of the anesthesia (medicine) used during the test).   You may shower.   Do not sign any important legal documents or operate any machinery for 24 hours (because of the anesthesia used during the test).  NUTRITION  Drink plenty of fluids.   You may resume your normal diet.   Begin with a light meal and progress to your normal diet.   Avoid alcoholic beverages for 24 hours or as instructed by your caregiver.  MEDICATIONS  You may resume your normal medications unless your caregiver tells you otherwise.  WHAT YOU CAN EXPECT TODAY  You may experience abdominal discomfort such as a feeling of fullness or "gas" pains.  FOLLOW-UP  Your doctor will discuss the results of your test with you.  SEEK IMMEDIATE MEDICAL ATTENTION IF ANY OF THE FOLLOWING OCCUR:  Excessive nausea (feeling sick to your stomach) and/or vomiting.   Severe abdominal pain and distention (swelling).   Trouble swallowing.   Temperature over 101 F (37.8 C).   Rectal bleeding or vomiting of blood.    Your ulcer has completely healed.  Your stomach is still inflamed.  It was biopsied.  Your esophagus was dilated once again.  This  should help with your swallowing.  Decrease Protonix to 40 mg once daily  Further recommendations to follow pending review of pathology report  Office visit with [place] in 2 months

## 2019-07-02 NOTE — Op Note (Signed)
[facility] Patient Name: Tasha Clarke Procedure Date: 07/02/2019 11:41 AM MRN: [id number] Date of Birth: 26-Aug-1950 Attending MD: Norvel Richards , MD CSN: [id number] Age: 68 Admit Type: Outpatient Procedure:                Upper GI endoscopy Indications:              Surveillance procedure, Dysphagia Providers:                Norvel Richards, MD, Janeece Riggers, RN, Aram Candela Referring MD:              Medicines:                Propofol per Anesthesia Complications:            No immediate complications. Estimated Blood Loss:     Estimated blood loss was minimal. Procedure:                Pre-Anesthesia Assessment:                           - Prior to the procedure, a History and Physical                            was performed, and patient medications and                            allergies were reviewed. The patient's tolerance of                            previous anesthesia was also reviewed. The risks                            and benefits of the procedure and the sedation                            options and risks were discussed with the patient.                            All questions were answered, and informed consent                            was obtained. Prior Anticoagulants: The patient has                            taken no previous anticoagulant or antiplatelet                            agents. ASA Grade Assessment: III - A patient with                            severe systemic disease. After reviewing the risks  and benefits, the patient was deemed in                            satisfactory condition to undergo the procedure.                           After obtaining informed consent, the endoscope was                            passed under direct vision. Throughout the                            procedure, the patient's blood pressure, pulse, and                            oxygen  saturations were monitored continuously. The                            [id number]) scope was introduced through the                            mouth, and advanced to the second part of duodenum.                            The upper GI endoscopy was accomplished without                            difficulty. The patient tolerated the procedure                            well. Scope In: 12:43:11 PM Scope Out: 12:50:27 PM Total Procedure Duration: 0 hours 7 minutes 16 seconds  Findings:      A mild Schatzki ring was found at the gastroesophageal junction.      Diffuse mucosal changes were found in the entire examined stomach.      The duodenal bulb and second portion of the duodenum were normal. The       scope was withdrawn. Dilation was performed with a Maloney dilator with       mild resistance at 63 Fr. The scope was withdrawn. Dilation was       performed with a Maloney dilator with mild resistance at 56 Fr. The       dilation site was examined following endoscope reinsertion and showed       moderate mucosal disruption. Estimated blood loss was minimal.       previously noted ulcer healed/ eformed mucosa persists Impression:               - Mild Schatzki ring. Dilated.                           - Mucosal changes in the stomach. Ulcer healed                           - Normal duodenal bulb and second portion of the  duodenum.                           - No specimens collected. Moderate Sedation:      Moderate (conscious) sedation was personally administered by an       anesthesia professional. The following parameters were monitored: oxygen       saturation, heart rate, blood pressure, respiratory rate, EKG, adequacy       of pulmonary ventilation, and response to care. Recommendation:           - Patient has a contact number available for                            emergencies. The signs and symptoms of potential                            delayed  complications were discussed with the                            patient. Return to normal activities tomorrow.                            Written discharge instructions were provided to the                            patient.                           - Resume previous diet.                           - Continue present medications. Decrease Protonix                            to 40 mg once daily                           - Await pathology results.                           - Return to my office in 8 weeks. Procedure Code(s):        --- Professional ---                           [phone], Esophagogastroduodenoscopy, flexible,                            transoral; diagnostic, including collection of                            specimen(s) by brushing or washing, when performed                            (separate procedure)                           [id number], Dilation of esophagus, by unguided sound or  bougie, single or multiple passes Diagnosis Code(s):        --- Professional ---                           K22.2, Esophageal obstruction                           K31.89, Other diseases of stomach and duodenum                           R13.10, Dysphagia, unspecified CPT copyright 2019 American Medical Association. All rights reserved. The codes documented in this report are preliminary and upon coder review may  be revised to meet current compliance requirements. [name]. [name], MD Norvel Richards, MD 07/02/2019 1:00:23 PM This report has been signed electronically. Number of Addenda: 0

## 2019-07-02 NOTE — Anesthesia Preprocedure Evaluation (Signed)
Anesthesia Evaluation  Patient identified by MRN, date of birth, ID band Patient awake    Reviewed: Allergy & Precautions, NPO status , Patient's Chart, lab work & pertinent test results  Airway Mallampati: II  TM Distance: >3 FB Neck ROM: Full    Dental no notable dental hx. (+) Poor Dentition   Pulmonary asthma , sleep apnea, Continuous Positive Airway Pressure Ventilation and Oxygen sleep apnea , pneumonia, resolved, COPD,  COPD inhaler and oxygen dependent, Current Smoker and Patient abstained from smoking.,    Pulmonary exam normal breath sounds clear to auscultation       Cardiovascular Exercise Tolerance: Poor hypertension, Pt. on medications and Pt. on home beta blockers + Cardiac Stents and +CHF  Normal cardiovascular examIII Rhythm:Regular Rate:Normal  Reports essentially non ambulatory  C/w smoking and 02 use Reports 3 cardiac stents -states last ~2015    Neuro/Psych Seizures -, Well Controlled,  Anxiety Depression  Neuromuscular disease negative psych ROS   GI/Hepatic Neg liver ROS, PUD, GERD  ,  Endo/Other  diabetesHypothyroidism   Renal/GU Renal InsufficiencyRenal disease  negative genitourinary   Musculoskeletal  (+) Arthritis , Osteoarthritis,    Abdominal   Peds negative pediatric ROS (+)  Hematology negative hematology ROS (+) anemia ,   Anesthesia Other Findings   Reproductive/Obstetrics negative OB ROS                             Anesthesia Physical Anesthesia Plan  ASA: IV  Anesthesia Plan: General   Post-op Pain Management:    Induction: Intravenous  PONV Risk Score and Plan: 2 and Propofol infusion, TIVA and Treatment may vary due to age or medical condition  Airway Management Planned: Nasal Cannula and Simple Face Mask  Additional Equipment:   Intra-op Plan:   Post-operative Plan:   Informed Consent: I have reviewed the patients History and Physical,  chart, labs and discussed the procedure including the risks, benefits and alternatives for the proposed anesthesia with the patient or authorized representative who has indicated his/her understanding and acceptance.     Dental advisory given  Plan Discussed with: CRNA  Anesthesia Plan Comments: (Plan Full PPE use  Plan GA with GETA as needed d/w pt -WTP with same after Q&A  D/w pt if ETT uses needed , there is a possible need for postprocedural ventilation -voiced understanding WTP )        Anesthesia Quick Evaluation

## 2019-07-03 ENCOUNTER — Encounter: Payer: Self-pay | Admitting: Internal Medicine

## 2019-07-03 ENCOUNTER — Other Ambulatory Visit: Payer: Self-pay

## 2019-07-03 LAB — SURGICAL PATHOLOGY

## 2019-07-05 ENCOUNTER — Other Ambulatory Visit: Payer: Self-pay | Admitting: Family Medicine

## 2019-07-09 ENCOUNTER — Other Ambulatory Visit: Payer: Medicare PPO

## 2019-07-09 ENCOUNTER — Telehealth: Payer: Self-pay | Admitting: Neurology

## 2019-07-09 ENCOUNTER — Ambulatory Visit: Payer: Medicare PPO | Admitting: Family

## 2019-07-09 NOTE — Telephone Encounter (Signed)
Please have schedule OV

## 2019-07-09 NOTE — Telephone Encounter (Signed)
I called patient and LVM per Dr. Cathren Laine request to reschedule d/t scheduling conflict. Patient is 1 yr follow-up and may be rescheduled with Dr. Jaynee Eagles or Amy NP.

## 2019-07-17 ENCOUNTER — Other Ambulatory Visit: Payer: Self-pay

## 2019-07-17 ENCOUNTER — Inpatient Hospital Stay (HOSPITAL_BASED_OUTPATIENT_CLINIC_OR_DEPARTMENT_OTHER): Payer: Medicare Other | Admitting: Family

## 2019-07-17 ENCOUNTER — Telehealth: Payer: Self-pay | Admitting: Family

## 2019-07-17 ENCOUNTER — Inpatient Hospital Stay: Payer: Medicare Other

## 2019-07-17 ENCOUNTER — Inpatient Hospital Stay: Payer: Medicare Other | Attending: Family

## 2019-07-17 VITALS — BP 113/70 | HR 75 | Temp 97.1°F | Resp 16

## 2019-07-17 DIAGNOSIS — N189 Chronic kidney disease, unspecified: Secondary | ICD-10-CM | POA: Diagnosis not present

## 2019-07-17 DIAGNOSIS — D631 Anemia in chronic kidney disease: Secondary | ICD-10-CM | POA: Insufficient documentation

## 2019-07-17 DIAGNOSIS — G629 Polyneuropathy, unspecified: Secondary | ICD-10-CM | POA: Insufficient documentation

## 2019-07-17 DIAGNOSIS — D509 Iron deficiency anemia, unspecified: Secondary | ICD-10-CM

## 2019-07-17 DIAGNOSIS — K219 Gastro-esophageal reflux disease without esophagitis: Secondary | ICD-10-CM | POA: Insufficient documentation

## 2019-07-17 DIAGNOSIS — J449 Chronic obstructive pulmonary disease, unspecified: Secondary | ICD-10-CM | POA: Diagnosis not present

## 2019-07-17 DIAGNOSIS — D508 Other iron deficiency anemias: Secondary | ICD-10-CM

## 2019-07-17 DIAGNOSIS — Z9981 Dependence on supplemental oxygen: Secondary | ICD-10-CM | POA: Insufficient documentation

## 2019-07-17 DIAGNOSIS — Z79899 Other long term (current) drug therapy: Secondary | ICD-10-CM | POA: Diagnosis not present

## 2019-07-17 DIAGNOSIS — M549 Dorsalgia, unspecified: Secondary | ICD-10-CM | POA: Insufficient documentation

## 2019-07-17 DIAGNOSIS — Z95828 Presence of other vascular implants and grafts: Secondary | ICD-10-CM

## 2019-07-17 DIAGNOSIS — G8929 Other chronic pain: Secondary | ICD-10-CM | POA: Insufficient documentation

## 2019-07-17 LAB — RETICULOCYTES
Immature Retic Fract: 28 % — ABNORMAL HIGH (ref 2.3–15.9)
RBC.: 3.18 MIL/uL — ABNORMAL LOW (ref 3.87–5.11)
Retic Count, Absolute: 59.5 10*3/uL (ref 19.0–186.0)
Retic Ct Pct: 1.9 % (ref 0.4–3.1)

## 2019-07-17 LAB — CBC WITH DIFFERENTIAL (CANCER CENTER ONLY)
Abs Immature Granulocytes: 0.04 10*3/uL (ref 0.00–0.07)
Basophils Absolute: 0.1 10*3/uL (ref 0.0–0.1)
Basophils Relative: 1 %
Eosinophils Absolute: 0.3 10*3/uL (ref 0.0–0.5)
Eosinophils Relative: 5 %
HCT: 31.4 % — ABNORMAL LOW (ref 36.0–46.0)
Hemoglobin: 9.7 g/dL — ABNORMAL LOW (ref 12.0–15.0)
Immature Granulocytes: 1 %
Lymphocytes Relative: 17 %
Lymphs Abs: 1.2 10*3/uL (ref 0.7–4.0)
MCH: 30.5 pg (ref 26.0–34.0)
MCHC: 30.9 g/dL (ref 30.0–36.0)
MCV: 98.7 fL (ref 80.0–100.0)
Monocytes Absolute: 0.6 10*3/uL (ref 0.1–1.0)
Monocytes Relative: 8 %
Neutro Abs: 4.7 10*3/uL (ref 1.7–7.7)
Neutrophils Relative %: 68 %
Platelet Count: 259 10*3/uL (ref 150–400)
RBC: 3.18 MIL/uL — ABNORMAL LOW (ref 3.87–5.11)
RDW: 16.5 % — ABNORMAL HIGH (ref 11.5–15.5)
WBC Count: 6.8 10*3/uL (ref 4.0–10.5)
nRBC: 0 % (ref 0.0–0.2)

## 2019-07-17 MED ORDER — EPOETIN ALFA-EPBX 40000 UNIT/ML IJ SOLN
40000.0000 [IU] | Freq: Once | INTRAMUSCULAR | Status: AC
  Administered 2019-07-17: 40000 [IU] via SUBCUTANEOUS

## 2019-07-17 MED ORDER — HEPARIN SOD (PORK) LOCK FLUSH 100 UNIT/ML IV SOLN
500.0000 [IU] | Freq: Once | INTRAVENOUS | Status: AC
  Administered 2019-07-17: 500 [IU] via INTRAVENOUS
  Filled 2019-07-17: qty 5

## 2019-07-17 MED ORDER — SODIUM CHLORIDE 0.9% FLUSH
10.0000 mL | INTRAVENOUS | Status: DC | PRN
  Administered 2019-07-17: 10 mL via INTRAVENOUS
  Filled 2019-07-17: qty 10

## 2019-07-17 MED ORDER — EPOETIN ALFA-EPBX 40000 UNIT/ML IJ SOLN
INTRAMUSCULAR | Status: AC
  Filled 2019-07-17: qty 1

## 2019-07-17 NOTE — Progress Notes (Signed)
Hematology and Oncology Follow Up Visit  Tasha Clarke [id number] [birth year] 69 y.o. 07/17/2019   Principle Diagnosis:  Iron deficiency anemia   Current Therapy:   IV iron as indicated  Retacrit SQ for Hgb < 11    Interim History:  Tasha Clarke is here today for follow-up. She is symptomatic with fatigue and increased SOB.  She has a cough which she attributes to asthma and COPD.  She is currently on 3L [place] supplemental O2 24 hours a day and tolerating this well.  She will occasionally have a little blood in her nose from dryness and has a humdifier that she runs while at home.  She has not noted any blood loss. No bruising or petechiae.  No fever, chills, n/v, rash, dizziness, chest pain, palpitations, abdominal pain or changes in bowel or bladder habits.  She helps care for her husband who has ALS. They will sometimes fall when supporting each other while ambulating. Thankfully, they have not been seriously injured. She uses a walker when ambulating at home. Today she is in a wheelchair as it is a far distance for her to walk to get into our office.  She has swelling that comes and goes in her lower extremities.  She has numbness and tingling in the feet that comes and goes.  No syncopal episodes to report.  She states that her appetite comes and goes but she is hydrating.   ECOG Performance Status: 2 - Symptomatic, <50% confined to bed  Medications:  Allergies as of 07/17/2019      Reactions   Iodinated Diagnostic Agents Anaphylaxis   Coded   Cephalexin Hives   Ketorolac Tromethamine Hives   Lac Bovis Nausea And Vomiting   Imdur [isosorbide Dinitrate] Nausea And Vomiting   Other Other (See Comments)   Alka seltzer causes blurred vision, fainting   Sodium Bicarbonate-citric Acid Nausea And Vomiting   Milk-related Compounds Nausea And Vomiting      Medication List       Accurate as of July 17, 2019 12:03 PM. If you have any questions, ask your nurse or  doctor.        alendronate 70 MG tablet Commonly known as: FOSAMAX Take 1 tablet (70 mg total) by mouth every Monday. Take with a full glass of water on an empty stomach.   buprenorphine 20 MCG/HR Ptwk patch Commonly known as: BUTRANS Place 1 patch onto the skin once a week. Sunday   cetirizine 10 MG tablet Commonly known as: EQ Allergy Relief (Cetirizine) Take 1 tablet (10 mg total) by mouth daily.   cyclobenzaprine 10 MG tablet Commonly known as: FLEXERIL TAKE 1 TABLET BY MOUTH AT BEDTIME   dicyclomine 10 MG capsule Commonly known as: BENTYL Take 1 capsule (10 mg total) by mouth 4 (four) times daily as needed (for diarrhea).   DULoxetine 30 MG capsule Commonly known as: CYMBALTA Take 3 capsules (90 mg total) by mouth daily.   EPINEPHrine 0.3 mg/0.3 mL Soaj injection Commonly known as: EPI-PEN Inject 0.3 mLs (0.3 mg total) into the muscle as needed. What changed: reasons to take this   Fish Oil 1000 MG Caps Take 1,000 mg by mouth daily.   fluticasone 50 MCG/ACT nasal spray Commonly known as: Flonase Place 2 sprays into both nostrils daily. What changed: when to take this   folic acid 1 MG tablet Commonly known as: FOLVITE Take 1 mg by mouth daily.   gabapentin 600 MG tablet Commonly known as: NEURONTIN Take 1 tablet (  600 mg total) by mouth 4 (four) times daily.   hydrOXYzine 25 MG tablet Commonly known as: ATARAX/VISTARIL Take 25 mg by mouth 3 (three) times daily as needed.   ipratropium-albuterol 0.5-2.5 (3) MG/3ML Soln Commonly known as: DUONEB Take 3 mLs by nebulization every 6 (six) hours as needed (shortness of breathe).   Iron 325 (65 Fe) MG Tabs Take 1 tablet (325 mg total) by mouth daily.   levETIRAcetam 1000 MG tablet Commonly known as: KEPPRA Take 1 tablet (1,000 mg total) by mouth at bedtime.   levETIRAcetam 750 MG tablet Commonly known as: KEPPRA Take 1 tablet (750 mg total) by mouth every morning.   levothyroxine 50 MCG  tablet Commonly known as: SYNTHROID Take 1 tablet (50 mcg total) by mouth daily before breakfast.   Magnesium 400 MG Caps Take 400 mg by mouth daily.   Melatonin 10 MG Tabs Take 10 mg by mouth at bedtime.   metoprolol tartrate 25 MG tablet Commonly known as: LOPRESSOR Take 12.5 mg by mouth 2 (two) times daily.   multivitamin tablet Take 1 tablet by mouth daily.   Narcan 4 MG/0.1ML Liqd nasal spray kit Generic drug: naloxone Place 0.4 mg into the nose once.   ondansetron 4 MG tablet Commonly known as: ZOFRAN TAKE 1 TABLET BY MOUTH 4 TIMES DAILY AS NEEDED What changed:   how much to take  how to take this  when to take this  reasons to take this  additional instructions   pantoprazole 40 MG tablet Commonly known as: PROTONIX Take 40 mg by mouth 2 (two) times daily.   potassium chloride 10 MEQ tablet Commonly known as: KLOR-CON Take 10 mEq by mouth daily.   Proventil HFA 108 (90 Base) MCG/ACT inhaler Generic drug: albuterol Inhale 1-2 puffs into the lungs every 4 (four) hours as needed for wheezing or shortness of breath.   QUEtiapine 200 MG tablet Commonly known as: SEROQUEL Take 1 tablet (200 mg total) by mouth at bedtime.   rosuvastatin 40 MG tablet Commonly known as: CRESTOR Take 1 tablet (40 mg total) by mouth daily.   traMADol 50 MG tablet Commonly known as: ULTRAM Take 50 mg by mouth every 8 (eight) hours as needed (pain).   Trelegy Ellipta 100-62.5-25 MCG/INH Aepb Generic drug: Fluticasone-Umeclidin-Vilant Inhale 1 puff into the lungs 1 day or 1 dose.   Tylenol PM Extra Strength 25-500 MG Tabs tablet Generic drug: diphenhydramine-acetaminophen Take 2 tablets by mouth at bedtime.   VITAMIN B-12 PO Take 1 tablet by mouth daily.   cyanocobalamin 100 MCG tablet Take 100 mcg by mouth daily.   Vitamin D-3 125 MCG (5000 UT) Tabs Take 5,000 Units by mouth daily.       Allergies:  Allergies  Allergen Reactions  . Iodinated Diagnostic  Agents Anaphylaxis    Coded  . Cephalexin Hives  . Ketorolac Tromethamine Hives  . Lac Bovis Nausea And Vomiting  . Imdur [Isosorbide Dinitrate] Nausea And Vomiting  . Other Other (See Comments)    Alka seltzer causes blurred vision, fainting  . Sodium Bicarbonate-Citric Acid Nausea And Vomiting  . Milk-Related Compounds Nausea And Vomiting    Past Medical History, Surgical history, Social history, and Family History were reviewed and updated.  Review of Systems: All other 10 point review of systems is negative.   Physical Exam:  temperature is 97.1 F (36.2 C) (abnormal). Her blood pressure is 113/70 and her pulse is 75. Her respiration is 16 and oxygen saturation is 97%.  Wt Readings from Last 3 Encounters:  07/02/19 210 lb (95.3 kg)  05/21/19 215 lb (97.5 kg)  04/18/19 220 lb (99.8 kg)    Ocular: Sclerae unicteric, pupils equal, round and reactive to light Ear-nose-throat: Oropharynx clear, dentition fair Lymphatic: No cervical or supraclavicular adenopathy Lungs no rales or rhonchi, good excursion bilaterally Heart regular rate and rhythm, no murmur appreciated Abd soft, nontender, positive bowel sounds, no liver or spleen tip palpated on exam, no fluid wave  MSK no focal spinal tenderness, no joint edema Neuro: non-focal, well-oriented, appropriate affect Breasts: Deferred   Lab Results  Component Value Date   WBC 6.8 07/17/2019   HGB 9.7 (L) 07/17/2019   HCT 31.4 (L) 07/17/2019   MCV 98.7 07/17/2019   PLT 259 07/17/2019   Lab Results  Component Value Date   FERRITIN 314 (H) 05/28/2019   IRON 85 05/28/2019   TIBC 288 05/28/2019   UIBC 203 05/28/2019   IRONPCTSAT 30 05/28/2019   Lab Results  Component Value Date   RETICCTPCT 1.9 07/17/2019   RBC 3.18 (L) 07/17/2019   Lab Results  Component Value Date   KPAFRELGTCHN 35.9 (H) 11/16/2017   LAMBDASER 39.8 (H) 11/16/2017   KAPLAMBRATIO 0.90 11/16/2017   No results found for: Kandis Cocking, IGMSERUM Lab  Results  Component Value Date   TOTALPROTELP 6.6 11/16/2017   ALBUMINELP 3.3 11/16/2017   A1GS 0.3 11/16/2017   A2GS 0.8 11/16/2017   BETS 1.1 11/16/2017   GAMS 1.1 11/16/2017   MSPIKE Not Observed 11/16/2017   SPEI Comment 11/16/2017     Chemistry      Component Value Date/Time   NA 141 05/28/2019 1046   NA 134 07/26/2018 0929   K 3.6 05/28/2019 1046   CL 108 05/28/2019 1046   CO2 24 05/28/2019 1046   BUN 36 (H) 05/28/2019 1046   BUN 23 07/26/2018 0929   CREATININE 1.66 (H) 05/28/2019 1046      Component Value Date/Time   CALCIUM 9.4 05/28/2019 1046   ALKPHOS 57 05/28/2019 1046   AST 16 05/28/2019 1046   ALT 12 05/28/2019 1046   BILITOT 0.2 (L) 05/28/2019 1046       Impression and Plan: Ms. Clermont is a very pleasant 69 yo caucasian female with multifactorial anemia.  She received Retacrit today for Hgb 9.7, MCV 98.7.  We will see what her iron studies show and bring her back in for infusion if needed.  We will plan to see her back in another 3 weeks.  She will contact our office with any questions or concerns. We can certainly see her sooner if needed.   Laverna Peace, NP 1/5/202112:03 PM

## 2019-07-17 NOTE — Telephone Encounter (Signed)
Appointments scheduled letter/calendar mailed per 1/5 los

## 2019-07-18 ENCOUNTER — Other Ambulatory Visit: Payer: Self-pay | Admitting: Family

## 2019-07-18 LAB — IRON AND TIBC
Iron: 19 ug/dL — ABNORMAL LOW (ref 41–142)
Saturation Ratios: 10 % — ABNORMAL LOW (ref 21–57)
TIBC: 196 ug/dL — ABNORMAL LOW (ref 236–444)
UIBC: 177 ug/dL (ref 120–384)

## 2019-07-18 LAB — FERRITIN: Ferritin: 107 ng/mL (ref 11–307)

## 2019-07-19 ENCOUNTER — Telehealth: Payer: Self-pay | Admitting: Family

## 2019-07-19 NOTE — Telephone Encounter (Signed)
I called and LMVm for patient regarding appointments added per 1/6 sch msg

## 2019-07-20 ENCOUNTER — Other Ambulatory Visit: Payer: Self-pay | Admitting: Family

## 2019-07-25 ENCOUNTER — Inpatient Hospital Stay: Payer: Medicare Other

## 2019-07-29 ENCOUNTER — Other Ambulatory Visit: Payer: Self-pay | Admitting: Family Medicine

## 2019-07-29 DIAGNOSIS — F339 Major depressive disorder, recurrent, unspecified: Secondary | ICD-10-CM

## 2019-07-30 ENCOUNTER — Inpatient Hospital Stay: Payer: Medicare Other

## 2019-07-30 ENCOUNTER — Other Ambulatory Visit: Payer: Self-pay

## 2019-07-30 VITALS — BP 102/68 | HR 81 | Temp 97.3°F | Resp 20

## 2019-07-30 DIAGNOSIS — N189 Chronic kidney disease, unspecified: Secondary | ICD-10-CM | POA: Diagnosis not present

## 2019-07-30 DIAGNOSIS — D508 Other iron deficiency anemias: Secondary | ICD-10-CM

## 2019-07-30 MED ORDER — SODIUM CHLORIDE 0.9 % IV SOLN
200.0000 mg | Freq: Once | INTRAVENOUS | Status: AC
  Administered 2019-07-30: 15:00:00 200 mg via INTRAVENOUS
  Filled 2019-07-30: qty 10

## 2019-07-30 MED ORDER — SODIUM CHLORIDE 0.9 % IV SOLN
Freq: Once | INTRAVENOUS | Status: AC
  Filled 2019-07-30: qty 250

## 2019-07-30 MED ORDER — SODIUM CHLORIDE 0.9% FLUSH
10.0000 mL | Freq: Once | INTRAVENOUS | Status: AC | PRN
  Administered 2019-07-30: 10 mL
  Filled 2019-07-30: qty 10

## 2019-07-30 MED ORDER — HEPARIN SOD (PORK) LOCK FLUSH 100 UNIT/ML IV SOLN
500.0000 [IU] | Freq: Once | INTRAVENOUS | Status: AC | PRN
  Administered 2019-07-30: 500 [IU]
  Filled 2019-07-30: qty 5

## 2019-07-30 NOTE — Patient Instructions (Addendum)
[facility] Discharge Instructions for Patients Receiving Chemotherapy  Today you received the following chemotherapy agents Venofer  To help prevent nausea and vomiting after your treatment, we encourage you to take your nausea medication .If you develop nausea and vomiting that is not controlled by your nausea medication, call the clinic.   BELOW ARE SYMPTOMS THAT SHOULD BE REPORTED IMMEDIATELY:  *FEVER GREATER THAN 100.5 F  *CHILLS WITH OR WITHOUT FEVER  NAUSEA AND VOMITING THAT IS NOT CONTROLLED WITH YOUR NAUSEA MEDICATION  *UNUSUAL SHORTNESS OF BREATH  *UNUSUAL BRUISING OR BLEEDING  TENDERNESS IN MOUTH AND THROAT WITH OR WITHOUT PRESENCE OF ULCERS  *URINARY PROBLEMS  *BOWEL PROBLEMS  UNUSUAL RASH Items with * indicate a potential emergency and should be followed up as soon as possible.  Feel free to call the clinic should you have any questions or concerns. The clinic phone number is [phone].  Please show the [facility] at check-in to the Emergency Department and triage nurse.   Anemia  Anemia is a condition in which you do not have enough red blood cells or hemoglobin. Hemoglobin is a substance in red blood cells that carries oxygen. When you do not have enough red blood cells or hemoglobin (are anemic), your body cannot get enough oxygen and your organs may not work properly. As a result, you may feel very tired or have other problems. What are the causes? Common causes of anemia include: Excessive bleeding. Anemia can be caused by excessive bleeding inside or outside the body, including bleeding from the intestine or from periods in women. Poor nutrition. Long-lasting (chronic) kidney, thyroid, and liver disease. Bone marrow disorders. Cancer and treatments for cancer. HIV (human immunodeficiency virus) and AIDS (acquired immunodeficiency syndrome). Treatments for HIV and AIDS. Spleen problems. Blood disorders. Infections, medicines,  and autoimmune disorders that destroy red blood cells. What are the signs or symptoms? Symptoms of this condition include: Minor weakness. Dizziness. Headache. Feeling heartbeats that are irregular or faster than normal (palpitations). Shortness of breath, especially with exercise. Paleness. Cold sensitivity. Indigestion. Nausea. Difficulty sleeping. Difficulty concentrating. Symptoms may occur suddenly or develop slowly. If your anemia is mild, you may not have symptoms. How is this diagnosed? This condition is diagnosed based on: Blood tests. Your medical history. A physical exam. Bone marrow biopsy. Your health care provider may also check your stool (feces) for blood and may do additional testing to look for the cause of your bleeding. You may also have other tests, including: Imaging tests, such as a CT scan or MRI. Endoscopy. Colonoscopy. How is this treated? Treatment for this condition depends on the cause. If you continue to lose a lot of blood, you may need to be treated at a hospital. Treatment may include: Taking supplements of iron, vitamin V76, or folic acid. Taking a hormone medicine (erythropoietin) that can help to stimulate red blood cell growth. Having a blood transfusion. This may be needed if you lose a lot of blood. Making changes to your diet. Having surgery to remove your spleen. Follow these instructions at home: Take over-the-counter and prescription medicines only as told by your health care provider. Take supplements only as told by your health care provider. Follow any diet instructions that you were given. Keep all follow-up visits as told by your health care provider. This is important. Contact a health care provider if: You develop new bleeding anywhere in the body. Get help right away if: You are very weak. You are short of  breath. You have pain in your abdomen or chest. You are dizzy or feel faint. You have trouble concentrating. You  have bloody or black, tarry stools. You vomit repeatedly or you vomit up blood. Summary Anemia is a condition in which you do not have enough red blood cells or enough of a substance in your red blood cells that carries oxygen (hemoglobin). Symptoms may occur suddenly or develop slowly. If your anemia is mild, you may not have symptoms. This condition is diagnosed with blood tests as well as a medical history and physical exam. Other tests may be needed. Treatment for this condition depends on the cause of the anemia. This information is not intended to replace advice given to you by your health care provider. Make sure you discuss any questions you have with your health care provider. Document Revised: 06/10/2017 Document Reviewed: 07/30/2016 Elsevier Patient Education  [place].

## 2019-08-01 ENCOUNTER — Inpatient Hospital Stay: Payer: Medicare Other

## 2019-08-01 ENCOUNTER — Other Ambulatory Visit: Payer: Self-pay

## 2019-08-01 VITALS — BP 198/81 | HR 75 | Temp 96.9°F | Resp 18

## 2019-08-01 DIAGNOSIS — D508 Other iron deficiency anemias: Secondary | ICD-10-CM

## 2019-08-01 DIAGNOSIS — Z95828 Presence of other vascular implants and grafts: Secondary | ICD-10-CM

## 2019-08-01 DIAGNOSIS — N189 Chronic kidney disease, unspecified: Secondary | ICD-10-CM | POA: Diagnosis not present

## 2019-08-01 MED ORDER — SODIUM CHLORIDE 0.9 % IV SOLN
200.0000 mg | Freq: Once | INTRAVENOUS | Status: AC
  Administered 2019-08-01: 200 mg via INTRAVENOUS
  Filled 2019-08-01: qty 200

## 2019-08-01 MED ORDER — SODIUM CHLORIDE 0.9% FLUSH
10.0000 mL | INTRAVENOUS | Status: DC | PRN
  Administered 2019-08-01: 10 mL via INTRAVENOUS
  Filled 2019-08-01: qty 10

## 2019-08-01 MED ORDER — SODIUM CHLORIDE 0.9 % IV SOLN
Freq: Once | INTRAVENOUS | Status: AC
  Filled 2019-08-01: qty 250

## 2019-08-01 MED ORDER — HEPARIN SOD (PORK) LOCK FLUSH 100 UNIT/ML IV SOLN
500.0000 [IU] | Freq: Once | INTRAVENOUS | Status: AC
  Administered 2019-08-01: 16:00:00 500 [IU] via INTRAVENOUS
  Filled 2019-08-01: qty 5

## 2019-08-01 NOTE — Patient Instructions (Signed)

## 2019-08-02 ENCOUNTER — Ambulatory Visit: Payer: Medicare PPO | Admitting: Neurology

## 2019-08-06 ENCOUNTER — Other Ambulatory Visit: Payer: Self-pay | Admitting: Family Medicine

## 2019-08-06 DIAGNOSIS — F339 Major depressive disorder, recurrent, unspecified: Secondary | ICD-10-CM

## 2019-08-07 ENCOUNTER — Other Ambulatory Visit: Payer: Self-pay

## 2019-08-07 ENCOUNTER — Inpatient Hospital Stay: Payer: Medicare Other

## 2019-08-07 ENCOUNTER — Inpatient Hospital Stay (HOSPITAL_BASED_OUTPATIENT_CLINIC_OR_DEPARTMENT_OTHER): Payer: Medicare Other | Admitting: Family

## 2019-08-07 ENCOUNTER — Encounter: Payer: Self-pay | Admitting: Family

## 2019-08-07 VITALS — BP 100/61 | HR 78 | Temp 97.1°F | Resp 18 | Ht 65.0 in

## 2019-08-07 DIAGNOSIS — D509 Iron deficiency anemia, unspecified: Secondary | ICD-10-CM

## 2019-08-07 DIAGNOSIS — D631 Anemia in chronic kidney disease: Secondary | ICD-10-CM

## 2019-08-07 DIAGNOSIS — Z95828 Presence of other vascular implants and grafts: Secondary | ICD-10-CM

## 2019-08-07 DIAGNOSIS — D508 Other iron deficiency anemias: Secondary | ICD-10-CM

## 2019-08-07 DIAGNOSIS — N189 Chronic kidney disease, unspecified: Secondary | ICD-10-CM | POA: Diagnosis not present

## 2019-08-07 LAB — CBC WITH DIFFERENTIAL (CANCER CENTER ONLY)
Abs Immature Granulocytes: 0.1 10*3/uL — ABNORMAL HIGH (ref 0.00–0.07)
Basophils Absolute: 0.1 10*3/uL (ref 0.0–0.1)
Basophils Relative: 1 %
Eosinophils Absolute: 0.2 10*3/uL (ref 0.0–0.5)
Eosinophils Relative: 2 %
HCT: 37.1 % (ref 36.0–46.0)
Hemoglobin: 11.1 g/dL — ABNORMAL LOW (ref 12.0–15.0)
Immature Granulocytes: 1 %
Lymphocytes Relative: 23 %
Lymphs Abs: 2 10*3/uL (ref 0.7–4.0)
MCH: 30.3 pg (ref 26.0–34.0)
MCHC: 29.9 g/dL — ABNORMAL LOW (ref 30.0–36.0)
MCV: 101.4 fL — ABNORMAL HIGH (ref 80.0–100.0)
Monocytes Absolute: 0.7 10*3/uL (ref 0.1–1.0)
Monocytes Relative: 8 %
Neutro Abs: 5.7 10*3/uL (ref 1.7–7.7)
Neutrophils Relative %: 65 %
Platelet Count: 230 10*3/uL (ref 150–400)
RBC: 3.66 MIL/uL — ABNORMAL LOW (ref 3.87–5.11)
RDW: 17.2 % — ABNORMAL HIGH (ref 11.5–15.5)
WBC Count: 8.7 10*3/uL (ref 4.0–10.5)
nRBC: 0 % (ref 0.0–0.2)

## 2019-08-07 LAB — CMP (CANCER CENTER ONLY)
ALT: 13 U/L (ref 0–44)
AST: 23 U/L (ref 15–41)
Albumin: 3.4 g/dL — ABNORMAL LOW (ref 3.5–5.0)
Alkaline Phosphatase: 61 U/L (ref 38–126)
Anion gap: 10 (ref 5–15)
BUN: 28 mg/dL — ABNORMAL HIGH (ref 8–23)
CO2: 18 mmol/L — ABNORMAL LOW (ref 22–32)
Calcium: 8.6 mg/dL — ABNORMAL LOW (ref 8.9–10.3)
Chloride: 108 mmol/L (ref 98–111)
Creatinine: 1.38 mg/dL — ABNORMAL HIGH (ref 0.44–1.00)
GFR, Est AFR Am: 46 mL/min — ABNORMAL LOW
GFR, Estimated: 39 mL/min — ABNORMAL LOW
Glucose, Bld: 96 mg/dL (ref 70–99)
Potassium: 4 mmol/L (ref 3.5–5.1)
Sodium: 136 mmol/L (ref 135–145)
Total Bilirubin: 0.2 mg/dL — ABNORMAL LOW (ref 0.3–1.2)
Total Protein: 6.1 g/dL — ABNORMAL LOW (ref 6.5–8.1)

## 2019-08-07 MED ORDER — HEPARIN SOD (PORK) LOCK FLUSH 100 UNIT/ML IV SOLN
500.0000 [IU] | Freq: Once | INTRAVENOUS | Status: AC
  Administered 2019-08-07: 500 [IU] via INTRAVENOUS
  Filled 2019-08-07: qty 5

## 2019-08-07 MED ORDER — SODIUM CHLORIDE 0.9% FLUSH
10.0000 mL | Freq: Once | INTRAVENOUS | Status: AC
  Administered 2019-08-07: 10 mL via INTRAVENOUS
  Filled 2019-08-07: qty 10

## 2019-08-07 NOTE — Progress Notes (Signed)
Hematology and Oncology Follow Up Visit  Tasha Clarke [id number] 1951-04-11 69 y.o. 08/07/2019   Principle Diagnosis:  Iron deficiency anemia   Current Therapy:   IV iron as indicated  Retacrit SQ for Hgb < 11    Interim History:  Tasha Clarke is here today for follow-up. She is still symptomatic with fatigue.  She stays busy helping take care of her sweet husband who has ALS. Their son and his girlfriend also help them.  Thankfully the Retacrit appears to be working and her Hgb is now up to 11.1 (previously 9.7).  She states that she will occasionally note blood in her stool and states that she has history of bleeding ulcers.  She has not noted any other blood loss. Her skin is thin on her arms and she does bruises easily.  She states that she has n/v with both food and fluids. She has history of GERD and is taking her protonix BID as prescribed. Her gastroenterologist is Dr. Jannette Spanner.  This has caused a decrease in her appetite.  Her SOB with exertion worsens. She is still on supplemental O2 24 hours a day and takes a break to rest when needed.  She steps outside away from her O2 to smoke. She verbalized understanding of the dangers of this. We do recommend that she quit.   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  She has chronic back pain  She states despite having a spinal stimulator. She also notes sciatica from the left hip down her leg at times.  She denies having any falls or syncopal episodes.   ECOG Performance Status: 1 - Symptomatic but completely ambulatory  Medications:  Allergies as of 08/07/2019      Reactions   Iodinated Diagnostic Agents Anaphylaxis   Coded   Cephalexin Hives   Imdur [isosorbide Dinitrate] Nausea And Vomiting   Ketorolac Tromethamine Hives   Lac Bovis Nausea And Vomiting   Other Other (See Comments)   Alka seltzer causes blurred vision, fainting   Milk-related Compounds Nausea And Vomiting   Sodium Bicarbonate-citric Acid Nausea And Vomiting      Medication List       Accurate as of August 07, 2019  3:02 PM. If you have any questions, ask your nurse or doctor.        alendronate 70 MG tablet Commonly known as: FOSAMAX Take 1 tablet (70 mg total) by mouth every Monday. Take with a full glass of water on an empty stomach.   buprenorphine 20 MCG/HR Ptwk patch Commonly known as: BUTRANS Place 1 patch onto the skin once a week. Sunday   cetirizine 10 MG tablet Commonly known as: EQ Allergy Relief (Cetirizine) Take 1 tablet (10 mg total) by mouth daily.   cyclobenzaprine 10 MG tablet Commonly known as: FLEXERIL TAKE 1 TABLET BY MOUTH AT BEDTIME   dicyclomine 10 MG capsule Commonly known as: BENTYL Take 1 capsule (10 mg total) by mouth 4 (four) times daily as needed (for diarrhea).   DULoxetine 30 MG capsule Commonly known as: CYMBALTA TAKE 3 CAPSULES BY MOUTH ONCE DAILY   EPINEPHrine 0.3 mg/0.3 mL Soaj injection Commonly known as: EPI-PEN Inject 0.3 mLs (0.3 mg total) into the muscle as needed.   Fish Oil 1000 MG Caps Take 1,000 mg by mouth daily.   fluticasone 50 MCG/ACT nasal spray Commonly known as: Flonase Place 2 sprays into both nostrils daily. What changed: when to take this  folic acid 1 MG tablet Commonly known as: FOLVITE Take 1 mg by mouth daily.   gabapentin 600 MG tablet Commonly known as: NEURONTIN Take 1 tablet by mouth 4 times daily   hydrOXYzine 25 MG tablet Commonly known as: ATARAX/VISTARIL Take 25 mg by mouth 3 (three) times daily as needed.   ipratropium-albuterol 0.5-2.5 (3) MG/3ML Soln Commonly known as: DUONEB Take 3 mLs by nebulization every 6 (six) hours as needed (shortness of breathe).   Iron 325 (65 Fe) MG Tabs Take 1 tablet (325 mg total) by mouth daily.   levETIRAcetam 1000 MG tablet Commonly known as: KEPPRA Take 1 tablet (1,000 mg total) by mouth at bedtime.   levETIRAcetam 750 MG tablet Commonly known as:  KEPPRA Take 1 tablet (750 mg total) by mouth every morning.   levothyroxine 50 MCG tablet Commonly known as: SYNTHROID Take 1 tablet (50 mcg total) by mouth daily before breakfast.   Magnesium 400 MG Caps Take 400 mg by mouth daily.   Melatonin 10 MG Tabs Take 10 mg by mouth at bedtime.   metoprolol tartrate 25 MG tablet Commonly known as: LOPRESSOR Take 12.5 mg by mouth 2 (two) times daily.   multivitamin tablet Take 1 tablet by mouth daily.   Narcan 4 MG/0.1ML Liqd nasal spray kit Generic drug: naloxone Place 0.4 mg into the nose once.   ondansetron 4 MG tablet Commonly known as: ZOFRAN TAKE 1 TABLET BY MOUTH 4 TIMES DAILY AS NEEDED What changed:   how much to take  how to take this  when to take this  reasons to take this  additional instructions   pantoprazole 40 MG tablet Commonly known as: PROTONIX Take 40 mg by mouth 2 (two) times daily.   potassium chloride 10 MEQ tablet Commonly known as: KLOR-CON Take 10 mEq by mouth daily.   Proventil HFA 108 (90 Base) MCG/ACT inhaler Generic drug: albuterol Inhale 1-2 puffs into the lungs every 4 (four) hours as needed for wheezing or shortness of breath.   QUEtiapine 200 MG tablet Commonly known as: SEROQUEL Take 1 tablet (200 mg total) by mouth at bedtime.   rosuvastatin 40 MG tablet Commonly known as: CRESTOR Take 1 tablet (40 mg total) by mouth daily.   traMADol 50 MG tablet Commonly known as: ULTRAM Take 50 mg by mouth every 8 (eight) hours as needed (pain).   Trelegy Ellipta 100-62.5-25 MCG/INH Aepb Generic drug: Fluticasone-Umeclidin-Vilant Inhale 1 puff into the lungs 1 day or 1 dose.   Tylenol PM Extra Strength 25-500 MG Tabs tablet Generic drug: diphenhydramine-acetaminophen Take 2 tablets by mouth at bedtime.   VITAMIN B-12 PO Take 1 tablet by mouth daily.   cyanocobalamin 100 MCG tablet Take 100 mcg by mouth daily.   Vitamin D-3 125 MCG (5000 UT) Tabs Take 5,000 Units by mouth  daily.       Allergies:  Allergies  Allergen Reactions  . Iodinated Diagnostic Agents Anaphylaxis    Coded  . Cephalexin Hives  . Imdur [Isosorbide Dinitrate] Nausea And Vomiting  . Ketorolac Tromethamine Hives  . Lac Bovis Nausea And Vomiting  . Other Other (See Comments)    Alka seltzer causes blurred vision, fainting  . Milk-Related Compounds Nausea And Vomiting  . Sodium Bicarbonate-Citric Acid Nausea And Vomiting    Past Medical History, Surgical history, Social history, and Family History were reviewed and updated.  Review of Systems: All other 10 point review of systems is negative.   Physical Exam:  height is '5\' 5"'  (  1.651 m). Her temporal temperature is 97.1 F (36.2 C) (abnormal). Her blood pressure is 100/61 and her pulse is 78. Her respiration is 18 and oxygen saturation is 100%.   Wt Readings from Last 3 Encounters:  07/02/19 210 lb (95.3 kg)  05/21/19 215 lb (97.5 kg)  04/18/19 220 lb (99.8 kg)    Ocular: Sclerae unicteric, pupils equal, round and reactive to light Ear-nose-throat: Oropharynx clear, dentition fair Lymphatic: No cervical or supraclavicular adenopathy Lungs course throughout, good excursion bilaterally Heart regular rate and rhythm, no murmur appreciated Abd soft, nontender, positive bowel sounds, no liver or spleen tip palpated on exam, no fluid wave  MSK no focal spinal tenderness, no joint edema Neuro: non-focal, well-oriented, appropriate affect Breasts: Deferred   Lab Results  Component Value Date   WBC 8.7 08/07/2019   HGB 11.1 (L) 08/07/2019   HCT 37.1 08/07/2019   MCV 101.4 (H) 08/07/2019   PLT 230 08/07/2019   Lab Results  Component Value Date   FERRITIN 107 07/17/2019   IRON 19 (L) 07/17/2019   TIBC 196 (L) 07/17/2019   UIBC 177 07/17/2019   IRONPCTSAT 10 (L) 07/17/2019   Lab Results  Component Value Date   RETICCTPCT 1.9 07/17/2019   RBC 3.66 (L) 08/07/2019   Lab Results  Component Value Date   KPAFRELGTCHN  35.9 (H) 11/16/2017   LAMBDASER 39.8 (H) 11/16/2017   KAPLAMBRATIO 0.90 11/16/2017   No results found for: Kandis Cocking, IGMSERUM Lab Results  Component Value Date   TOTALPROTELP 6.6 11/16/2017   ALBUMINELP 3.3 11/16/2017   A1GS 0.3 11/16/2017   A2GS 0.8 11/16/2017   BETS 1.1 11/16/2017   GAMS 1.1 11/16/2017   MSPIKE Not Observed 11/16/2017   SPEI Comment 11/16/2017     Chemistry      Component Value Date/Time   NA 141 05/28/2019 1046   NA 134 07/26/2018 0929   K 3.6 05/28/2019 1046   CL 108 05/28/2019 1046   CO2 24 05/28/2019 1046   BUN 36 (H) 05/28/2019 1046   BUN 23 07/26/2018 0929   CREATININE 1.66 (H) 05/28/2019 1046      Component Value Date/Time   CALCIUM 9.4 05/28/2019 1046   ALKPHOS 57 05/28/2019 1046   AST 16 05/28/2019 1046   ALT 12 05/28/2019 1046   BILITOT 0.2 (L) 05/28/2019 1046       Impression and Plan: Ms. Lizama is a pleasant 69 yo caucasian female with multifactorial anemia.  No Retacrit needed this visit for Hgb 11.1.  We will see her back in another 3 weeks.  I have forwarded today's note to her gastroenterologist Dr. Gala Romney so he will be aware of her worsening GERD.  She will contact our office with any questions or concerns. We can certainly see her sooner if needed.   Laverna Peace, NP 1/26/20213:02 PM

## 2019-08-07 NOTE — Patient Instructions (Signed)
Tunneled Central Venous Catheter Flushing Guide  It is important to flush your tunneled central venous catheter each time you use it, both before and after you use it. Flushing your catheter will help prevent it from clogging. What are the risks? Risks may include:  Infection.  Air getting into the catheter and bloodstream. Supplies needed:  A clean pair of gloves.  A disinfecting wipe. Use an alcohol wipe, chlorhexidine wipe, or iodine wipe as told by your health care provider.  A 10 mL syringe that has been prefilled with saline solution.  An empty 10 mL syringe, if a substance called heparin was injected into your catheter. How to flush your catheter When you flush your catheter, make sure you follow any specific instructions from your health care provider or the manufacturer. These are general guidelines. Flushing your catheter before use If there is heparin in your catheter: 1. Wash your hands with soap and water. 2. Put on gloves. 3. Scrub the injection cap for a minimum of 15 seconds with a disinfecting wipe. 4. Unclamp the catheter. 5. Attach the empty syringe to the injection cap. 6. Pull the syringe plunger back and withdraw 10 mL of blood. 7. Place the syringe into an appropriate waste container. 8. Scrub the injection cap for 15 seconds with a disinfecting wipe. 9. Attach the prefilled syringe to the injection cap. 10. Flush the catheter by pushing the plunger forward until all the liquid from the syringe is in the catheter. 11. Remove the syringe from the injection cap. 12. Clamp the catheter. If there is no heparin in your catheter: 1. Wash your hands with soap and water. 2. Put on gloves. 3. Scrub the injection cap for 15 seconds with a disinfecting wipe. 4. Unclamp the catheter. 5. Attach the prefilled syringe to the injection cap. 6. Flush the catheter by pushing the plunger forward until 5 mL of the liquid from the syringe is in the catheter. 7. Pull back on  the syringe until you see blood in the catheter. 8. If you have been asked to collect any blood, follow your health care provider's instructions. Otherwise, flush the catheter with the rest of the solution from the syringe. 9. Remove the syringe from the injection cap. 10. Clamp the catheter.  Flushing your catheter after use 1. Wash your hands with soap and water. 2. Put on gloves. 3. Scrub the injection cap for 15 seconds with a disinfecting wipe. 4. Unclamp the catheter. 5. Attach the prefilled syringe to the injection cap. 6. Flush the catheter by pushing the plunger forward until all of the liquid from the syringe is in the catheter. 7. Remove the syringe from the injection cap. 8. Clamp the catheter. Problems and solutions  If blood cannot be completely cleared from the injection cap, you may need to have the injection cap replaced.  If the catheter is difficult to flush, use the pulsing method. The pulsing method involves pushing only a few milliliters of solution into the catheter at a time and pausing between pushes.  If you do not see blood in the catheter when you pull back on the syringe, change your body position, such as by raising your arms above your head. Take a deep breath and cough. Then, pull back on the syringe. If you still do not see blood, flush the catheter with a small amount of solution. Then, change positions again and take a breath or cough. Pull back on the syringe again. If you still do not see   blood, finish flushing the catheter and contact your health care provider. Do not use your catheter until your health care provider says it is okay. General tips  Have someone help you flush your catheter, if possible.  Do not force fluid through your catheter.  Do not use a syringe that is larger or smaller than 10 mL. Using a smaller syringe can make the catheter burst.  Do not use your catheter without flushing it first if it has heparin in it. Contact a health  care provider if:  You cannot see any blood in the catheter when you flush it before using it.  Your catheter is difficult to flush. Get help right away if:  You cannot flush the catheter.  The catheter leaks when you flush it or when there is fluid in it.  There are cracks or breaks in the catheter. Summary  It is important to flush your tunneled central venous catheter each time you use it, both before and after you use it.  Scrub the injection cap for 15 seconds with a disinfecting wipe before and after you flush it.  When you flush your catheter, make sure you follow any specific instructions from your health care provider or the manufacturer.  Get help right away if you cannot flush the catheter. This information is not intended to replace advice given to you by your health care provider. Make sure you discuss any questions you have with your health care provider. Document Revised: 03/23/2019 Document Reviewed: 09/13/2018 Elsevier Patient Education  2020 [employer]  

## 2019-08-14 ENCOUNTER — Other Ambulatory Visit (HOSPITAL_COMMUNITY): Payer: Medicare PPO

## 2019-08-21 ENCOUNTER — Ambulatory Visit (HOSPITAL_COMMUNITY): Payer: Medicare PPO | Admitting: Hematology

## 2019-08-21 ENCOUNTER — Other Ambulatory Visit: Payer: Self-pay | Admitting: Family

## 2019-08-21 DIAGNOSIS — N189 Chronic kidney disease, unspecified: Secondary | ICD-10-CM | POA: Insufficient documentation

## 2019-08-21 DIAGNOSIS — D631 Anemia in chronic kidney disease: Secondary | ICD-10-CM | POA: Insufficient documentation

## 2019-08-28 ENCOUNTER — Inpatient Hospital Stay: Payer: Medicare Other | Admitting: Family

## 2019-08-28 ENCOUNTER — Inpatient Hospital Stay: Payer: Medicare Other

## 2019-08-29 ENCOUNTER — Telehealth: Payer: Self-pay | Admitting: *Deleted

## 2019-08-29 ENCOUNTER — Ambulatory Visit: Payer: Medicare Other

## 2019-08-29 ENCOUNTER — Ambulatory Visit: Payer: Medicare Other | Admitting: Family

## 2019-08-29 ENCOUNTER — Other Ambulatory Visit: Payer: Medicare Other

## 2019-08-29 NOTE — Telephone Encounter (Signed)
Patients future daughter in law called to let us know patient would be cancelling the appt today.  She states patient is sick and cant keep anything down since her iron infusion one month ago.  Spoke with Laverna Peace NP.  Suggested patient daughter call her primary care physician to see if they can help as the iron infusion effects would not be going on this long.  Daughter in agreement and will call her primary care physician for assistance.

## 2019-10-17 ENCOUNTER — Ambulatory Visit: Payer: Medicare PPO | Admitting: Nurse Practitioner

## 2019-10-17 NOTE — Progress Notes (Deleted)
Referring Provider: Dettinger, Fransisca Kaufmann, MD Primary Care Physician:  Dettinger, Fransisca Kaufmann, MD Primary GI:  Dr. Gala Romney  No chief complaint on file.   HPI:   Tasha Clarke is a 69 y.o. female who presents for follow-up on dysphagia.  The patient was last seen in our office 04/18/2019 for the same as well as peptic ulcer disease.  Previously the CT during her recent hospitalization showed gastric wall thickening inquiry gastric mass.  EGD in 2019 with congestive gastropathy and antral deep folds but no infiltrating process.  Biopsies negative for H. pylori.  She had been having intermittently significant loose stools, up to 7 a day.  A follow-up endoscopy in 2020 with erosive reflux esophagitis and critical appearing Schatzki's ring status post dilation with a small hiatal hernia and some a dilated stomach with a 1 cm deep ulcer with clean base.  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 pylori.  Protonix was increased to twice a day.  Recommended consider changing Fosamax to parenteral therapy given dysphagia and repeat EGD in 3 months.  At her last visit her dysphagia symptoms had improved significantly, rare mid abdominal discomfort that is much improved, baseline nausea but no vomiting.  No other overt GI complaints.  Diarrhea noted to "have just stopped".  Recommended repeat EGD based on previous endoscopic recommendations.  Reviewed recommendations to change Fosamax to injectable treatment, continue current medications, follow-up in 6 months.  Surveillance EGD was completed 07/02/2019 which found mild Schatzki's ring status post dilation, mucosal changes in the stomach with noted ulcer healed.  Normal duodenum.  Surgical pathology found the gastric biopsies to be reactive gastropathy without H. pylori or intestinal metaplasia.  Recommended decrease Protonix to 40 mg once daily.  Follow-up in 8 weeks.  Today she states   Past Medical History:    Diagnosis Date  . Allergy   . Anemia   . Anxiety   . Arthritis   . Asthma   . Blood transfusion without reported diagnosis   . Cataract   . CHF (congestive heart failure) ([place])   . Chronic kidney disease   . Clotting disorder ([place])   . COPD (chronic obstructive pulmonary disease) ([place])   . Depression   . Diabetes mellitus without complication ([place])    pt states she is not diabetic   . Emphysema of lung ([place])   . 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 GERD (gastroesophageal reflux disease)   . Hyperlipidemia   . Hypertension   . Neuromuscular disorder ([place])   . Osteoporosis   . Seizures ([place])   . Skin cancer    2 areas removed more than 10 years ago  . Sleep apnea 1985   wears cpap nightly  . Thyroid disease     Past Surgical History:  Procedure Laterality Date  . APPENDECTOMY    . BIOPSY  03/16/2018   Procedure: BIOPSY;  Surgeon: Daneil Dolin, MD;  [facility];  Service: Endoscopy;;  gastric bx's  . BIOPSY  02/12/2019   Procedure: BIOPSY;  Surgeon: Daneil Dolin, MD;  [facility];  Service: Endoscopy;;  gastric/pyloric channel  . BIOPSY  07/02/2019   Procedure: BIOPSY;  Surgeon: Daneil Dolin, MD;  [facility];  Service: Endoscopy;;  . CAROTID ENDARTERECTOMY Bilateral   . CERVICAL SPINE SURGERY     after neck fracture  . ESOPHAGOGASTRODUODENOSCOPY (EGD) WITH PROPOFOL N/A 03/16/2018   Procedure: ESOPHAGOGASTRODUODENOSCOPY (EGD) WITH PROPOFOL;  Surgeon: Daneil Dolin, MD;  [facility];  Service: Endoscopy;  Laterality: N/A;  3:00pm  . ESOPHAGOGASTRODUODENOSCOPY (EGD) WITH PROPOFOL N/A 02/12/2019   Procedure: ESOPHAGOGASTRODUODENOSCOPY (EGD) WITH PROPOFOL;  Surgeon: Daneil Dolin, MD;  [facility];  Service: Endoscopy;  Laterality: N/A;  1:30pm  . ESOPHAGOGASTRODUODENOSCOPY (EGD) WITH PROPOFOL N/A 07/02/2019   Procedure: ESOPHAGOGASTRODUODENOSCOPY (EGD) WITH PROPOFOL;  Surgeon: Daneil Dolin, MD;   [facility];  Service: Endoscopy;  Laterality: N/A;  12:15PM  . EYE SURGERY    . FRACTURE SURGERY    . heart stents    . IR IMAGING GUIDED PORT INSERTION  05/21/2019  . JOINT REPLACEMENT     Right knee  . MALONEY DILATION N/A 02/12/2019   Procedure: Venia Minks DILATION;  Surgeon: Daneil Dolin, MD;  [facility];  Service: Endoscopy;  Laterality: N/A;  . SPINAL CORD STIMULATOR IMPLANT  2015  . SPINE SURGERY     lumbar x4     Current Outpatient Medications  Medication Sig Dispense Refill  . albuterol (PROVENTIL HFA) 108 (90 Base) MCG/ACT inhaler Inhale 1-2 puffs into the lungs every 4 (four) hours as needed for wheezing or shortness of breath.     Marland Kitchen alendronate (FOSAMAX) 70 MG tablet Take 1 tablet (70 mg total) by mouth every Monday. Take with a full glass of water on an empty stomach. 13 tablet 3  . buprenorphine (BUTRANS) 20 MCG/HR PTWK patch Place 1 patch onto the skin once a week. Sunday    . cetirizine (EQ ALLERGY RELIEF, CETIRIZINE,) 10 MG tablet Take 1 tablet (10 mg total) by mouth daily. 90 tablet 0  . Cholecalciferol (VITAMIN D-3) 5000 units TABS Take 5,000 Units by mouth daily.    . Cyanocobalamin (VITAMIN B-12 PO) Take 1 tablet by mouth daily.     . cyanocobalamin 100 MCG tablet Take 100 mcg by mouth daily.     . cyclobenzaprine (FLEXERIL) 10 MG tablet TAKE 1 TABLET BY MOUTH AT BEDTIME 30 tablet 0  . dicyclomine (BENTYL) 10 MG capsule Take 1 capsule (10 mg total) by mouth 4 (four) times daily as needed (for diarrhea). 90 capsule 0  . diphenhydramine-acetaminophen (TYLENOL PM EXTRA STRENGTH) 25-500 MG TABS tablet Take 2 tablets by mouth at bedtime.     . DULoxetine (CYMBALTA) 30 MG capsule TAKE 3 CAPSULES BY MOUTH ONCE DAILY 90 capsule 0  . EPINEPHrine 0.3 mg/0.3 mL IJ SOAJ injection Inject 0.3 mLs (0.3 mg total) into the muscle as needed. (Patient not taking: Reported on 08/07/2019) 1 each 1  . Ferrous Sulfate (IRON) 325 (65 Fe) MG TABS Take 1 tablet (325 mg  total) by mouth daily. (Patient taking differently: Take 325 mg by mouth daily. ) 90 each 0  . fluticasone (FLONASE) 50 MCG/ACT nasal spray Place 2 sprays into both nostrils daily. (Patient taking differently: Place 2 sprays into both nostrils 2 (two) times daily. ) 16 g 2  . Fluticasone-Umeclidin-Vilant (TRELEGY ELLIPTA) 100-62.5-25 MCG/INH AEPB Inhale 1 puff into the lungs 1 day or 1 dose.     . folic acid (FOLVITE) 1 MG tablet Take 1 mg by mouth daily.    Marland Kitchen gabapentin (NEURONTIN) 600 MG tablet Take 1 tablet by mouth 4 times daily 360 tablet 0  . hydrOXYzine (ATARAX/VISTARIL) 25 MG tablet Take 25 mg by mouth 3 (three) times daily as needed.    Marland Kitchen ipratropium-albuterol (DUONEB) 0.5-2.5 (3) MG/3ML SOLN Take 3 mLs by nebulization every 6 (six) hours  as needed (shortness of breathe). 360 mL 1  . levETIRAcetam (KEPPRA) 1000 MG tablet Take 1 tablet (1,000 mg total) by mouth at bedtime. 90 tablet 3  . levETIRAcetam (KEPPRA) 750 MG tablet Take 1 tablet (750 mg total) by mouth every morning. 90 tablet 3  . levothyroxine (SYNTHROID) 50 MCG tablet Take 1 tablet (50 mcg total) by mouth daily before breakfast. 90 tablet 3  . Magnesium 400 MG CAPS Take 400 mg by mouth daily.    . Melatonin 10 MG TABS Take 10 mg by mouth at bedtime.     . metoprolol tartrate (LOPRESSOR) 25 MG tablet Take 12.5 mg by mouth 2 (two) times daily.    . Multiple Vitamin (MULTIVITAMIN) tablet Take 1 tablet by mouth daily.    . naloxone (NARCAN) nasal spray 4 mg/0.1 mL Place 0.4 mg into the nose once.     . Omega-3 Fatty Acids (FISH OIL) 1000 MG CAPS Take 1,000 mg by mouth daily.    . ondansetron (ZOFRAN) 4 MG tablet TAKE 1 TABLET BY MOUTH 4 TIMES DAILY AS NEEDED (Patient taking differently: Take 4 mg by mouth 4 (four) times daily as needed for nausea or vomiting. ) 20 tablet 3  . pantoprazole (PROTONIX) 40 MG tablet Take 40 mg by mouth 2 (two) times daily.    . potassium chloride (K-DUR) 10 MEQ tablet Take 10 mEq by mouth daily.     .  QUEtiapine (SEROQUEL) 200 MG tablet Take 1 tablet (200 mg total) by mouth at bedtime. 90 tablet 1  . rosuvastatin (CRESTOR) 40 MG tablet Take 1 tablet (40 mg total) by mouth daily. 90 tablet 3  . traMADol (ULTRAM) 50 MG tablet Take 50 mg by mouth every 8 (eight) hours as needed (pain).      No current facility-administered medications for this visit.    Allergies as of 10/17/2019 - Review Complete 08/07/2019  Allergen Reaction Noted  . Iodinated diagnostic agents Anaphylaxis 05/28/2015  . Cephalexin Hives 01/24/2013  . Imdur [isosorbide dinitrate] Nausea And Vomiting 08/07/2019  . Ketorolac tromethamine Hives 07/26/2017  . [place] Nausea And Vomiting 05/28/2015  . Other Other (See Comments) 01/24/2013  . Milk-related compounds Nausea And Vomiting 05/28/2015  . Sodium bicarbonate-citric acid Nausea And Vomiting 08/24/2017    Family History  Adopted: Yes  Problem Relation Age of Onset  . Arthritis Mother   . Asthma Mother   . Cancer Mother        lung  . Depression Mother   . Diabetes Mother   . Arthritis Father   . Asthma Father   . Depression Father   . Asthma Sister   . Depression Sister   . Diabetes Sister   . Arthritis Maternal Grandmother   . Asthma Maternal Grandmother   . Depression Maternal Grandmother   . Arthritis Maternal Grandfather   . Asthma Maternal Grandfather   . Arthritis Paternal Grandmother   . Asthma Paternal Grandmother   . Arthritis Paternal Grandfather   . Asthma Paternal Grandfather   . COPD Paternal Grandfather   . Diabetes Sister   . Mental illness Sister   . Anemia Sister   . Hypertension Son   . Post-traumatic stress disorder Son   . Allergies Daughter   . Breast cancer Neg Hx   . Seizures Neg Hx        not that she knows of, pt adopted    Social History   Socioeconomic History  . Marital status: Married  Spouse name: Not on file  . Number of children: 2  . Years of education: 17  . Highest education level: Some college, no  degree  Occupational History  . Occupation: Retired  Tobacco Use  . Smoking status: Current Every Day Smoker    Packs/day: 1.00    Years: 43.00    Pack years: 43.00    Types: Cigarettes  . Smokeless tobacco: Never Used  Substance and Sexual Activity  . Alcohol use: Yes    Comment: occasionally, not even 1 drink per month   . Drug use: No  . Sexual activity: Not Currently    Birth control/protection: Post-menopausal  Other Topics Concern  . Not on file  Social History Narrative   Lives at home with husband and son   Right hand dominant   Caffeine: 4-5 cups daily (coffee & mtn dew)   Social Determinants of Health   Financial Resource Strain: Low Risk   . Difficulty of Paying Living Expenses: Not hard at all  Food Insecurity: No Food Insecurity  . Worried About Charity fundraiser in the Last Year: Never true  . Ran Out of Food in the Last Year: Never true  Transportation Needs: No Transportation Needs  . Lack of Transportation (Medical): No  . Lack of Transportation (Non-Medical): No  Physical Activity: Inactive  . Days of Exercise per Week: 0 days  . Minutes of Exercise per Session: 0 min  Stress: Stress Concern Present  . Feeling of Stress : To some extent  Social Connections: Somewhat Isolated  . Frequency of Communication with Friends and Family: More than three times a week  . Frequency of Social Gatherings with Friends and Family: Three times a week  . Attends Religious Services: Never  . Active Member of Clubs or Organizations: No  . Attends Archivist Meetings: Never  . Marital Status: Married    Subjective: Review of Systems  Constitutional: Negative for chills, fever, malaise/fatigue and weight loss.  HENT: Negative for congestion and sore throat.   Respiratory: Negative for cough and shortness of breath.   Cardiovascular: Negative for chest pain and palpitations.  Gastrointestinal: Negative for abdominal pain, blood in stool, diarrhea, melena,  nausea and vomiting.  Musculoskeletal: Negative for joint pain and myalgias.  Skin: Negative for rash.  Neurological: Negative for dizziness and weakness.  Endo/Heme/Allergies: Does not bruise/bleed easily.  Psychiatric/Behavioral: Negative for depression. The patient is not nervous/anxious.   All other systems reviewed and are negative.    Objective: There were no vitals taken for this visit. Physical Exam Vitals and nursing note reviewed.  Constitutional:      General: She is not in acute distress.    Appearance: Normal appearance. She is well-developed. She is not ill-appearing, toxic-appearing or diaphoretic.  HENT:     Head: Normocephalic and atraumatic.     Nose: No congestion or rhinorrhea.  Eyes:     General: No scleral icterus. Cardiovascular:     Rate and Rhythm: Normal rate and regular rhythm.     Heart sounds: Normal heart sounds.  Pulmonary:     Effort: Pulmonary effort is normal. No respiratory distress.     Breath sounds: Normal breath sounds.  Abdominal:     General: Bowel sounds are normal.     Palpations: Abdomen is soft. There is no hepatomegaly, splenomegaly or mass.     Tenderness: There is no abdominal tenderness. There is no guarding or rebound.     Hernia: No hernia is  present.  Skin:    General: Skin is warm and dry.     Coloration: Skin is not jaundiced.     Findings: No rash.  Neurological:     General: No focal deficit present.     Mental Status: She is alert and oriented to person, place, and time.  Psychiatric:        Attention and Perception: Attention normal.        Mood and Affect: Mood normal.        Speech: Speech normal.        Behavior: Behavior normal.        Thought Content: Thought content normal.        Cognition and Memory: Cognition and memory normal.       10/17/2019 7:55 AM   Disclaimer: This note was dictated with voice recognition software. Similar sounding words can inadvertently be transcribed and may not be  corrected upon review.

## 2019-11-14 ENCOUNTER — Other Ambulatory Visit: Payer: Self-pay | Admitting: Cardiology

## 2019-11-14 ENCOUNTER — Other Ambulatory Visit: Payer: Self-pay | Admitting: Family Medicine

## 2019-11-14 DIAGNOSIS — F339 Major depressive disorder, recurrent, unspecified: Secondary | ICD-10-CM

## 2019-11-15 ENCOUNTER — Other Ambulatory Visit: Payer: Self-pay | Admitting: *Deleted

## 2019-11-15 MED ORDER — HYDROXYZINE HCL 25 MG PO TABS: 25.0000 mg | ORAL_TABLET | Freq: Three times a day (TID) | ORAL | 1 refills | Status: DC | PRN

## 2020-01-12 DIAGNOSIS — T83511A Infection and inflammatory reaction due to indwelling urethral catheter, initial encounter: Secondary | ICD-10-CM | POA: Diagnosis not present

## 2020-01-12 DIAGNOSIS — B961 Klebsiella pneumoniae [K. pneumoniae] as the cause of diseases classified elsewhere: Secondary | ICD-10-CM | POA: Diagnosis not present

## 2020-01-12 DIAGNOSIS — Y846 Urinary catheterization as the cause of abnormal reaction of the patient, or of later complication, without mention of misadventure at the time of the procedure: Secondary | ICD-10-CM | POA: Diagnosis not present

## 2020-01-12 DIAGNOSIS — R319 Hematuria, unspecified: Secondary | ICD-10-CM | POA: Diagnosis not present

## 2020-01-18 ENCOUNTER — Ambulatory Visit (INDEPENDENT_AMBULATORY_CARE_PROVIDER_SITE_OTHER): Payer: Medicare Other | Admitting: Family Medicine

## 2020-01-18 ENCOUNTER — Other Ambulatory Visit: Payer: Self-pay

## 2020-01-18 ENCOUNTER — Encounter: Payer: Self-pay | Admitting: Family Medicine

## 2020-01-18 VITALS — BP 135/73 | HR 77 | Ht 65.0 in | Wt 188.0 lb

## 2020-01-18 DIAGNOSIS — G894 Chronic pain syndrome: Secondary | ICD-10-CM

## 2020-01-18 DIAGNOSIS — E785 Hyperlipidemia, unspecified: Secondary | ICD-10-CM | POA: Diagnosis not present

## 2020-01-18 DIAGNOSIS — F339 Major depressive disorder, recurrent, unspecified: Secondary | ICD-10-CM

## 2020-01-18 DIAGNOSIS — F419 Anxiety disorder, unspecified: Secondary | ICD-10-CM

## 2020-01-18 DIAGNOSIS — J439 Emphysema, unspecified: Secondary | ICD-10-CM | POA: Diagnosis not present

## 2020-01-18 DIAGNOSIS — Z978 Presence of other specified devices: Secondary | ICD-10-CM

## 2020-01-18 DIAGNOSIS — E039 Hypothyroidism, unspecified: Secondary | ICD-10-CM

## 2020-01-18 DIAGNOSIS — I10 Essential (primary) hypertension: Secondary | ICD-10-CM | POA: Diagnosis not present

## 2020-01-18 DIAGNOSIS — R339 Retention of urine, unspecified: Secondary | ICD-10-CM

## 2020-01-18 MED ORDER — QUETIAPINE FUMARATE 200 MG PO TABS: 400.0000 mg | ORAL_TABLET | Freq: Every day | ORAL | 1 refills | Status: DC

## 2020-01-18 MED ORDER — LEVETIRACETAM 750 MG PO TABS: 750.0000 mg | ORAL_TABLET | Freq: Every morning | ORAL | 3 refills | Status: DC

## 2020-01-18 MED ORDER — DULOXETINE HCL 60 MG PO CPEP: 120.0000 mg | ORAL_CAPSULE | Freq: Every day | ORAL | 1 refills | Status: DC

## 2020-01-18 MED ORDER — LEVETIRACETAM 1000 MG PO TABS: 1000.0000 mg | ORAL_TABLET | Freq: Every day | ORAL | 3 refills | Status: DC

## 2020-01-18 MED ORDER — CYCLOBENZAPRINE HCL 10 MG PO TABS: 10.0000 mg | ORAL_TABLET | Freq: Every day | ORAL | 1 refills | Status: DC

## 2020-01-18 MED ORDER — ALENDRONATE SODIUM 70 MG PO TABS: 70.0000 mg | ORAL_TABLET | ORAL | 3 refills | Status: DC

## 2020-01-18 MED ORDER — FOLIC ACID 1 MG PO TABS: 1.0000 mg | ORAL_TABLET | Freq: Every day | ORAL | 3 refills | Status: AC

## 2020-01-18 MED ORDER — GABAPENTIN 600 MG PO TABS: 600.0000 mg | ORAL_TABLET | Freq: Four times a day (QID) | ORAL | 1 refills | Status: AC

## 2020-01-18 MED ORDER — HYDROXYZINE HCL 25 MG PO TABS: 25.0000 mg | ORAL_TABLET | Freq: Three times a day (TID) | ORAL | 1 refills | Status: DC | PRN

## 2020-01-18 MED ORDER — PANTOPRAZOLE SODIUM 40 MG PO TBEC: 40.0000 mg | DELAYED_RELEASE_TABLET | Freq: Two times a day (BID) | ORAL | 3 refills | Status: DC

## 2020-01-18 MED ORDER — FLUTICASONE PROPIONATE 50 MCG/ACT NA SUSP: 2.0000 | Freq: Every day | NASAL | 2 refills | Status: DC

## 2020-01-18 MED ORDER — ROSUVASTATIN CALCIUM 40 MG PO TABS: 40.0000 mg | ORAL_TABLET | Freq: Every day | ORAL | 3 refills | Status: DC

## 2020-01-18 MED ORDER — LEVOTHYROXINE SODIUM 50 MCG PO TABS: 50.0000 ug | ORAL_TABLET | Freq: Every day | ORAL | 3 refills | Status: AC

## 2020-01-18 NOTE — Progress Notes (Addendum)
BP 135/73   Pulse 77   Ht 5' 5" (1.651 m)   Wt 188 lb (85.3 kg)   SpO2 99%   BMI 31.28 kg/m    Subjective:   Patient ID: Tasha Clarke, female    DOB: 06/07/1951, 69 y.o.   MRN: [id number]  HPI: Tasha Clarke is a 69 y.o. female presenting on 01/18/2020 for No chief complaint on file.   HPI COPD and emphysema Patient is coming in for COPD recheck today.  He is currently on cetirizine and Flonase and Trelegy and DuoNeb.  He has a mild chronic cough but denies any major coughing spells or wheezing spells.  He has 5nighttime symptoms per week and 7daytime symptoms per week currently.  She has gotten a lot worse since she has been more homebound since her husband passed away from [employer] disease  Hypothyroidism recheck Patient is coming in for thyroid recheck today as well. They deny any issues with hair changes or heat or cold problems or diarrhea or constipation. They deny any chest pain or palpitations. They are currently on levothyroxine 54mcrograms   Hypertension Patient is currently on metoprolol, and their blood pressure today is 135/73. Patient denies any lightheadedness or dizziness. Patient denies headaches, blurred vision, chest pains, shortness of breath, or weakness. Denies any side effects from medication and is content with current medication.   Patient is coming in to discuss depression anxiety and she has been feeling really depressed recently because of her husband's passing earlier in the year and she is just not been doing well and her daughter-in-law is here with her and says that she has not been leaving the house and she is been just staying at home and this is one of the only times that she is come out anytime recently and she just feeling more more down as the days go by.  Patient denies any suicidal ideations but she does not have a will to live.  Patient has neurogenic bladder and retention of urine which leads to some drainage and  incontinence, sees urology for this.  Relevant past medical, surgical, family and social history reviewed and updated as indicated. Interim medical history since our last visit reviewed. Allergies and medications reviewed and updated.  Review of Systems  Constitutional: Negative for chills and fever.  HENT: Negative for congestion, ear discharge and ear pain.   Eyes: Negative for redness and visual disturbance.  Respiratory: Positive for cough, shortness of breath and wheezing. Negative for chest tightness.   Cardiovascular: Negative for chest pain and leg swelling.  Genitourinary: Negative for difficulty urinating and dysuria.  Musculoskeletal: Negative for back pain and gait problem.  Skin: Negative for rash.  Neurological: Negative for light-headedness and headaches.  Psychiatric/Behavioral: Positive for dysphoric mood and sleep disturbance. Negative for agitation, behavioral problems, self-injury and suicidal ideas. The patient is nervous/anxious.   All other systems reviewed and are negative.   Per HPI unless specifically indicated above   Allergies as of 01/18/2020      Reactions   Iodinated Diagnostic Agents Anaphylaxis   Coded   Cephalexin Hives   Imdur [isosorbide Dinitrate] Nausea And Vomiting   Ketorolac Tromethamine Hives   Lac Bovis Nausea And Vomiting   Other Other (See Comments)   Alka seltzer causes blurred vision, fainting   Milk-related Compounds Nausea And Vomiting   Sodium Bicarbonate-citric Acid Nausea And Vomiting      Medication List       Accurate as of January 18, 2020 11:55 AM. If you have any questions, ask your nurse or doctor.        alendronate 70 MG tablet Commonly known as: FOSAMAX Take 1 tablet (70 mg total) by mouth every Monday. Take with a full glass of water on an empty stomach.   buprenorphine 20 MCG/HR Ptwk Commonly known as: BUTRANS Place 1 patch onto the skin once a week. Sunday   cetirizine 10 MG tablet Commonly known as: EQ  Allergy Relief (Cetirizine) Take 1 tablet (10 mg total) by mouth daily.   cyclobenzaprine 10 MG tablet Commonly known as: FLEXERIL TAKE 1 TABLET BY MOUTH AT BEDTIME   dicyclomine 10 MG capsule Commonly known as: BENTYL Take 1 capsule (10 mg total) by mouth 4 (four) times daily as needed (for diarrhea).   DULoxetine 30 MG capsule Commonly known as: CYMBALTA Take 3 capsules (90 mg total) by mouth daily. (Needs to be seen before next refill)   EPINEPHrine 0.3 mg/0.3 mL Soaj injection Commonly known as: EPI-PEN Inject 0.3 mLs (0.3 mg total) into the muscle as needed.   Fish Oil 1000 MG Caps Take 1,000 mg by mouth daily.   fluticasone 50 MCG/ACT nasal spray Commonly known as: Flonase Place 2 sprays into both nostrils daily. What changed: when to take this   folic acid 1 MG tablet Commonly known as: FOLVITE Take 1 mg by mouth daily.   gabapentin 600 MG tablet Commonly known as: NEURONTIN Take 1 tablet by mouth 4 times daily   hydrOXYzine 25 MG tablet Commonly known as: ATARAX/VISTARIL Take 1 tablet (25 mg total) by mouth 3 (three) times daily as needed. (Needs to be seen before next refill)   ipratropium-albuterol 0.5-2.5 (3) MG/3ML Soln Commonly known as: DUONEB Take 3 mLs by nebulization every 6 (six) hours as needed. (Needs to be seen before next refill)   Iron 325 (65 Fe) MG Tabs Take 1 tablet (325 mg total) by mouth daily.   levETIRAcetam 1000 MG tablet Commonly known as: KEPPRA Take 1 tablet (1,000 mg total) by mouth at bedtime.   levETIRAcetam 750 MG tablet Commonly known as: KEPPRA Take 1 tablet (750 mg total) by mouth every morning.   levothyroxine 50 MCG tablet Commonly known as: SYNTHROID Take 1 tablet (50 mcg total) by mouth daily before breakfast.   Magnesium 400 MG Caps Take 400 mg by mouth daily.   Melatonin 10 MG Tabs Take 10 mg by mouth at bedtime.   metoprolol tartrate 25 MG tablet Commonly known as: LOPRESSOR Take 12.5 mg by mouth 2 (two)  times daily.   multivitamin tablet Take 1 tablet by mouth daily.   Narcan 4 MG/0.1ML Liqd nasal spray kit Generic drug: naloxone Place 0.4 mg into the nose once.   ondansetron 4 MG tablet Commonly known as: ZOFRAN TAKE 1 TABLET BY MOUTH 4 TIMES DAILY AS NEEDED What changed:   how much to take  how to take this  when to take this  reasons to take this  additional instructions   pantoprazole 40 MG tablet Commonly known as: PROTONIX Take 40 mg by mouth 2 (two) times daily.   potassium chloride 10 MEQ tablet Commonly known as: KLOR-CON Take 10 mEq by mouth daily.   Proventil HFA 108 (90 Base) MCG/ACT inhaler Generic drug: albuterol Inhale 1-2 puffs into the lungs every 4 (four) hours as needed for wheezing or shortness of breath.   QUEtiapine 200 MG tablet Commonly known as: SEROQUEL Take 1 tablet (200 mg total) by mouth  at bedtime.   rosuvastatin 40 MG tablet Commonly known as: CRESTOR Take 1 tablet (40 mg total) by mouth daily.   traMADol 50 MG tablet Commonly known as: ULTRAM Take 50 mg by mouth every 8 (eight) hours as needed (pain).   Trelegy Ellipta 100-62.5-25 MCG/INH Aepb Generic drug: Fluticasone-Umeclidin-Vilant Inhale 1 puff into the lungs 1 day or 1 dose.   Tylenol PM Extra Strength 25-500 MG Tabs tablet Generic drug: diphenhydramine-acetaminophen Take 2 tablets by mouth at bedtime.   VITAMIN B-12 PO Take 1 tablet by mouth daily.   cyanocobalamin 100 MCG tablet Take 100 mcg by mouth daily.   Vitamin D-3 125 MCG (5000 UT) Tabs Take 5,000 Units by mouth daily.        Objective:   There were no vitals taken for this visit.  Wt Readings from Last 3 Encounters:  07/02/19 210 lb (95.3 kg)  05/21/19 215 lb (97.5 kg)  04/18/19 220 lb (99.8 kg)    Physical Exam Vitals and nursing note reviewed.  Constitutional:      General: She is not in acute distress.    Appearance: She is well-developed. She is not diaphoretic.  Eyes:      Conjunctiva/sclera: Conjunctivae normal.  Cardiovascular:     Rate and Rhythm: Normal rate and regular rhythm.     Heart sounds: Normal heart sounds. No murmur heard.   Pulmonary:     Effort: Pulmonary effort is normal. No respiratory distress.     Breath sounds: Wheezing (Patient has wheezes but is actually improved from where she has baseline) present.  Musculoskeletal:        General: No tenderness. Normal range of motion.  Skin:    General: Skin is warm and dry.     Findings: No rash.  Neurological:     Mental Status: She is alert and oriented to person, place, and time.     Coordination: Coordination normal.  Psychiatric:        Mood and Affect: Mood is anxious and depressed.        Behavior: Behavior normal.        Thought Content: Thought content does not include suicidal ideation. Thought content does not include suicidal plan.       Assessment & Plan:   Problem List Items Addressed This Visit      Cardiovascular and Mediastinum   Essential hypertension   Relevant Medications   rosuvastatin (CRESTOR) 40 MG tablet   Other Relevant Orders   CMP14+EGFR (Completed)     Respiratory   COPD (chronic obstructive pulmonary disease) (HCC)   Relevant Medications   fluticasone (FLONASE) 50 MCG/ACT nasal spray     Endocrine   Hypothyroid - Primary   Relevant Medications   levothyroxine (SYNTHROID) 50 MCG tablet   Other Relevant Orders   TSH (Completed)     Other   Hyperlipidemia LDL goal <100   Relevant Medications   rosuvastatin (CRESTOR) 40 MG tablet   Other Relevant Orders   Lipid panel (Completed)   Depression, recurrent (HCC)   Relevant Medications   DULoxetine (CYMBALTA) 60 MG capsule   hydrOXYzine (ATARAX/VISTARIL) 25 MG tablet   QUEtiapine (SEROQUEL) 200 MG tablet   Other Relevant Orders   CBC with Differential/Platelet (Completed)   Chronic pain syndrome   Relevant Medications   DULoxetine (CYMBALTA) 60 MG capsule   cyclobenzaprine (FLEXERIL) 10 MG  tablet   gabapentin (NEURONTIN) 600 MG tablet   levETIRAcetam (KEPPRA) 1000 MG tablet   levETIRAcetam (KEPPRA) 750  MG tablet   Other Relevant Orders   Ambulatory referral to [facility]   Anxiety   Relevant Medications   DULoxetine (CYMBALTA) 60 MG capsule   hydrOXYzine (ATARAX/VISTARIL) 25 MG tablet   QUEtiapine (SEROQUEL) 200 MG tablet    Other Visit Diagnoses    Urinary retention       Relevant Orders   For home use only DME Other see comment   Foley catheter in place       Relevant Orders   For home use only DME Other see comment      Patient has urinary retention and a Foley catheter in place since last hospitalization and is planning to follow-up with urology.  She needs supplies ordered and will order them.  She does have home health. Patient has anxiety and depression that is worsened since her husband's passing.  We will increase her antidepressants and see if we can help. Follow up plan: Return in about 3 months (around 04/19/2020), or if symptoms worsen or fail to improve, for COPD and thyroid and hypertension recheck and depression.  Counseling provided for all of the vaccine components No orders of the defined types were placed in this encounter.   Tasha Pina, MD [facility] 01/18/2020, 11:55 AM

## 2020-01-19 LAB — CBC WITH DIFFERENTIAL/PLATELET
Basophils Absolute: 0.1 10*3/uL (ref 0.0–0.2)
Basos: 1 %
EOS (ABSOLUTE): 0.4 10*3/uL (ref 0.0–0.4)
Eos: 6 %
Hematocrit: 35 % (ref 34.0–46.6)
Hemoglobin: 11.6 g/dL (ref 11.1–15.9)
Immature Grans (Abs): 0.1 10*3/uL (ref 0.0–0.1)
Immature Granulocytes: 1 %
Lymphocytes Absolute: 1.9 10*3/uL (ref 0.7–3.1)
Lymphs: 25 %
MCH: 31.4 pg (ref 26.6–33.0)
MCHC: 33.1 g/dL (ref 31.5–35.7)
MCV: 95 fL (ref 79–97)
Monocytes Absolute: 0.7 10*3/uL (ref 0.1–0.9)
Monocytes: 9 %
Neutrophils Absolute: 4.7 10*3/uL (ref 1.4–7.0)
Neutrophils: 58 %
Platelets: 228 10*3/uL (ref 150–450)
RBC: 3.7 x10E6/uL — ABNORMAL LOW (ref 3.77–5.28)
RDW: 13.4 % (ref 11.7–15.4)
WBC: 7.8 10*3/uL (ref 3.4–10.8)

## 2020-01-19 LAB — CMP14+EGFR
ALT: 25 IU/L (ref 0–32)
AST: 43 IU/L — ABNORMAL HIGH (ref 0–40)
Albumin/Globulin Ratio: 1.5 (ref 1.2–2.2)
Albumin: 4.2 g/dL (ref 3.8–4.8)
Alkaline Phosphatase: 76 IU/L (ref 48–121)
BUN/Creatinine Ratio: 26 (ref 12–28)
BUN: 23 mg/dL (ref 8–27)
Bilirubin Total: <0.2 mg/dL (ref 0.0–1.2)
CO2: 24 mmol/L (ref 20–29)
Calcium: 9.6 mg/dL (ref 8.7–10.3)
Chloride: 103 mmol/L (ref 96–106)
Creatinine, Ser: 0.88 mg/dL (ref 0.57–1.00)
GFR calc Af Amer: 78 mL/min/{1.73_m2} (ref >59)
GFR calc non Af Amer: 68 mL/min/{1.73_m2} (ref >59)
Globulin, Total: 2.8 g/dL (ref 1.5–4.5)
Glucose: 98 mg/dL (ref 65–99)
Potassium: 4.3 mmol/L (ref 3.5–5.2)
Sodium: 140 mmol/L (ref 134–144)
Total Protein: 7 g/dL (ref 6.0–8.5)

## 2020-01-19 LAB — TSH: TSH: 0.85 u[IU]/mL (ref 0.450–4.500)

## 2020-01-19 LAB — LIPID PANEL
Chol/HDL Ratio: 3.7 ratio (ref 0.0–4.4)
Cholesterol, Total: 132 mg/dL (ref 100–199)
HDL: 36 mg/dL — ABNORMAL LOW (ref >39)
LDL Chol Calc (NIH): 70 mg/dL (ref 0–99)
Triglycerides: 151 mg/dL — ABNORMAL HIGH (ref 0–149)
VLDL Cholesterol Cal: 26 mg/dL (ref 5–40)

## 2020-01-21 DIAGNOSIS — R339 Retention of urine, unspecified: Secondary | ICD-10-CM | POA: Diagnosis not present

## 2020-01-28 DIAGNOSIS — M62830 Muscle spasm of back: Secondary | ICD-10-CM | POA: Diagnosis not present

## 2020-01-28 DIAGNOSIS — G8929 Other chronic pain: Secondary | ICD-10-CM | POA: Diagnosis not present

## 2020-01-28 DIAGNOSIS — Z79899 Other long term (current) drug therapy: Secondary | ICD-10-CM | POA: Diagnosis not present

## 2020-01-28 DIAGNOSIS — M5416 Radiculopathy, lumbar region: Secondary | ICD-10-CM | POA: Diagnosis not present

## 2020-01-28 DIAGNOSIS — M5412 Radiculopathy, cervical region: Secondary | ICD-10-CM | POA: Diagnosis not present

## 2020-02-11 DIAGNOSIS — M62830 Muscle spasm of back: Secondary | ICD-10-CM | POA: Diagnosis not present

## 2020-02-11 DIAGNOSIS — G8929 Other chronic pain: Secondary | ICD-10-CM | POA: Diagnosis not present

## 2020-02-11 DIAGNOSIS — M5416 Radiculopathy, lumbar region: Secondary | ICD-10-CM | POA: Diagnosis not present

## 2020-02-11 DIAGNOSIS — M5412 Radiculopathy, cervical region: Secondary | ICD-10-CM | POA: Diagnosis not present

## 2020-02-25 DIAGNOSIS — M62830 Muscle spasm of back: Secondary | ICD-10-CM | POA: Diagnosis not present

## 2020-02-25 DIAGNOSIS — Z79899 Other long term (current) drug therapy: Secondary | ICD-10-CM | POA: Diagnosis not present

## 2020-02-25 DIAGNOSIS — G8929 Other chronic pain: Secondary | ICD-10-CM | POA: Diagnosis not present

## 2020-02-25 DIAGNOSIS — F17210 Nicotine dependence, cigarettes, uncomplicated: Secondary | ICD-10-CM | POA: Diagnosis not present

## 2020-02-25 DIAGNOSIS — M5416 Radiculopathy, lumbar region: Secondary | ICD-10-CM | POA: Diagnosis not present

## 2020-03-19 DIAGNOSIS — B9689 Other specified bacterial agents as the cause of diseases classified elsewhere: Secondary | ICD-10-CM | POA: Diagnosis not present

## 2020-03-19 DIAGNOSIS — R8271 Bacteriuria: Secondary | ICD-10-CM | POA: Diagnosis not present

## 2020-03-19 DIAGNOSIS — N390 Urinary tract infection, site not specified: Secondary | ICD-10-CM | POA: Diagnosis not present

## 2020-03-19 DIAGNOSIS — N319 Neuromuscular dysfunction of bladder, unspecified: Secondary | ICD-10-CM | POA: Diagnosis not present

## 2020-03-26 ENCOUNTER — Other Ambulatory Visit: Payer: Self-pay | Admitting: Family Medicine

## 2020-03-26 DIAGNOSIS — F419 Anxiety disorder, unspecified: Secondary | ICD-10-CM

## 2020-03-26 DIAGNOSIS — Z79891 Long term (current) use of opiate analgesic: Secondary | ICD-10-CM | POA: Diagnosis not present

## 2020-03-26 DIAGNOSIS — Z79899 Other long term (current) drug therapy: Secondary | ICD-10-CM | POA: Diagnosis not present

## 2020-03-26 DIAGNOSIS — M5416 Radiculopathy, lumbar region: Secondary | ICD-10-CM | POA: Diagnosis not present

## 2020-03-26 DIAGNOSIS — F17210 Nicotine dependence, cigarettes, uncomplicated: Secondary | ICD-10-CM | POA: Diagnosis not present

## 2020-03-26 DIAGNOSIS — F339 Major depressive disorder, recurrent, unspecified: Secondary | ICD-10-CM

## 2020-03-26 DIAGNOSIS — G8929 Other chronic pain: Secondary | ICD-10-CM | POA: Diagnosis not present

## 2020-04-06 DIAGNOSIS — R1084 Generalized abdominal pain: Secondary | ICD-10-CM | POA: Diagnosis not present

## 2020-04-06 DIAGNOSIS — Y999 Unspecified external cause status: Secondary | ICD-10-CM | POA: Diagnosis not present

## 2020-04-06 DIAGNOSIS — T83021A Displacement of indwelling urethral catheter, initial encounter: Secondary | ICD-10-CM | POA: Diagnosis not present

## 2020-04-06 DIAGNOSIS — Y846 Urinary catheterization as the cause of abnormal reaction of the patient, or of later complication, without mention of misadventure at the time of the procedure: Secondary | ICD-10-CM | POA: Diagnosis not present

## 2020-04-23 ENCOUNTER — Other Ambulatory Visit: Payer: Self-pay

## 2020-04-23 ENCOUNTER — Ambulatory Visit (INDEPENDENT_AMBULATORY_CARE_PROVIDER_SITE_OTHER): Payer: Medicare Other | Admitting: Family Medicine

## 2020-04-23 ENCOUNTER — Encounter: Payer: Self-pay | Admitting: Family Medicine

## 2020-04-23 VITALS — BP 138/72 | HR 81 | Temp 97.7°F | Ht 65.0 in | Wt 210.0 lb

## 2020-04-23 DIAGNOSIS — Z993 Dependence on wheelchair: Secondary | ICD-10-CM

## 2020-04-23 DIAGNOSIS — E785 Hyperlipidemia, unspecified: Secondary | ICD-10-CM | POA: Diagnosis not present

## 2020-04-23 DIAGNOSIS — Z780 Asymptomatic menopausal state: Secondary | ICD-10-CM

## 2020-04-23 DIAGNOSIS — I10 Essential (primary) hypertension: Secondary | ICD-10-CM | POA: Diagnosis not present

## 2020-04-23 DIAGNOSIS — J439 Emphysema, unspecified: Secondary | ICD-10-CM

## 2020-04-23 DIAGNOSIS — Z23 Encounter for immunization: Secondary | ICD-10-CM

## 2020-04-23 DIAGNOSIS — Z1159 Encounter for screening for other viral diseases: Secondary | ICD-10-CM | POA: Diagnosis not present

## 2020-04-23 DIAGNOSIS — E039 Hypothyroidism, unspecified: Secondary | ICD-10-CM

## 2020-04-23 MED ORDER — HYDROXYZINE HCL 25 MG PO TABS: 25.0000 mg | ORAL_TABLET | Freq: Three times a day (TID) | ORAL | 1 refills | Status: AC | PRN

## 2020-04-23 MED ORDER — PREDNISONE 20 MG PO TABS: ORAL_TABLET | ORAL | 0 refills | Status: DC

## 2020-04-23 NOTE — Progress Notes (Signed)
BP 138/72   Pulse 81   Temp 97.7 F (36.5 C)   Ht '5\' 5"'  (1.651 m)   Wt 210 lb (95.3 kg)   SpO2 100%   BMI 34.95 kg/m    Subjective:   Patient ID: Tasha Clarke, female    DOB: 11-12-1950, 69 y.o.   MRN: [id number]  HPI: Tasha Clarke is a 69 y.o. female presenting on 04/23/2020 for Medical Management of Chronic Issues, Depression, Chest Pain (comes and goes), and Hypothyroidism   HPI Hypothyroidism recheck Patient is coming in for thyroid recheck today as well. They deny any issues with hair changes or heat or cold problems or diarrhea or constipation. They deny any chest pain or palpitations. They are currently on levothyroxine 50 micrograms   Hyperlipidemia Patient is coming in for recheck of his hyperlipidemia. The patient is currently taking fish oil and Crestor. They deny any issues with myalgias or history of liver damage from it. They deny any focal numbness or weakness or chest pain.   Hypertension Patient is currently on works metoprolol, and their blood pressure today is 138/72. Patient denies any lightheadedness or dizziness. Patient denies headaches, blurred vision, chest pains, shortness of breath, or weakness. Denies any side effects from medication and is content with current medication.   COPD Patient is coming in for COPD recheck today.  He is currently on albuterol and Trelegy.  She is having a lot more congestion and coughing patient's wheezing has been increased.Marland Kitchen  He has 5nighttime symptoms per week and 7daytime symptoms per week currently.   Patient is wheelchair-bound and wants to get some aide to help at home because she is struggling getting around because she is becoming increasingly wheelchair-bound since her husband passed away last year.  Relevant past medical, surgical, family and social history reviewed and updated as indicated. Interim medical history since our last visit reviewed. Allergies and medications reviewed and  updated.  Review of Systems  Constitutional: Negative for chills and fever.  HENT: Positive for congestion. Negative for ear discharge and ear pain.   Eyes: Negative for redness and visual disturbance.  Respiratory: Positive for cough, shortness of breath and wheezing. Negative for chest tightness.   Cardiovascular: Negative for chest pain and leg swelling.  Genitourinary: Negative for difficulty urinating and dysuria.  Musculoskeletal: Negative for back pain and gait problem.  Skin: Negative for rash.  Neurological: Negative for dizziness, light-headedness and headaches.  Psychiatric/Behavioral: Negative for agitation and behavioral problems.  All other systems reviewed and are negative.   Per HPI unless specifically indicated above   Allergies as of 04/23/2020      Reactions   Iodinated Diagnostic Agents Anaphylaxis   Coded   Cephalexin Hives   Imdur [isosorbide Dinitrate] Nausea And Vomiting   Ketorolac Tromethamine Hives   Lac Bovis Nausea And Vomiting   Other Other (See Comments)   Alka seltzer causes blurred vision, fainting   Milk-related Compounds Nausea And Vomiting   Sodium Bicarbonate-citric Acid Nausea And Vomiting      Medication List       Accurate as of April 23, 2020  3:59 PM. If you have any questions, ask your nurse or doctor.        alendronate 70 MG tablet Commonly known as: FOSAMAX Take 1 tablet (70 mg total) by mouth every Monday. Take with a full glass of water on an empty stomach.   buprenorphine 20 MCG/HR Ptwk Commonly known as: BUTRANS Place 1 patch onto the skin  once a week. Sunday   cetirizine 10 MG tablet Commonly known as: EQ Allergy Relief (Cetirizine) Take 1 tablet (10 mg total) by mouth daily.   cyclobenzaprine 10 MG tablet Commonly known as: FLEXERIL Take 1 tablet (10 mg total) by mouth at bedtime.   dicyclomine 10 MG capsule Commonly known as: BENTYL Take 1 capsule (10 mg total) by mouth 4 (four) times daily as needed (for  diarrhea).   DULoxetine 60 MG capsule Commonly known as: CYMBALTA Take 2 capsules (120 mg total) by mouth daily.   EPINEPHrine 0.3 mg/0.3 mL Soaj injection Commonly known as: EPI-PEN Inject 0.3 mLs (0.3 mg total) into the muscle as needed.   Fish Oil 1000 MG Caps Take 1,000 mg by mouth daily.   fluticasone 50 MCG/ACT nasal spray Commonly known as: Flonase Place 2 sprays into both nostrils daily.   folic acid 1 MG tablet Commonly known as: FOLVITE Take 1 tablet (1 mg total) by mouth daily.   gabapentin 600 MG tablet Commonly known as: NEURONTIN Take 1 tablet (600 mg total) by mouth in the morning, at noon, in the evening, and at bedtime.   hydrOXYzine 25 MG tablet Commonly known as: ATARAX/VISTARIL Take 1 tablet (25 mg total) by mouth 3 (three) times daily as needed.   ipratropium-albuterol 0.5-2.5 (3) MG/3ML Soln Commonly known as: DUONEB Take 3 mLs by nebulization every 6 (six) hours as needed. (Needs to be seen before next refill)   Iron 325 (65 Fe) MG Tabs Take 1 tablet (325 mg total) by mouth daily.   levETIRAcetam 1000 MG tablet Commonly known as: KEPPRA Take 1 tablet (1,000 mg total) by mouth at bedtime.   levETIRAcetam 750 MG tablet Commonly known as: KEPPRA Take 1 tablet (750 mg total) by mouth every morning.   levothyroxine 50 MCG tablet Commonly known as: SYNTHROID Take 1 tablet (50 mcg total) by mouth daily before breakfast.   Magnesium 400 MG Caps Take 400 mg by mouth daily.   Melatonin 10 MG Tabs Take 10 mg by mouth at bedtime.   metoprolol tartrate 25 MG tablet Commonly known as: LOPRESSOR Take 12.5 mg by mouth 2 (two) times daily.   multivitamin tablet Take 1 tablet by mouth daily.   Narcan 4 MG/0.1ML Liqd nasal spray kit Generic drug: naloxone Place 0.4 mg into the nose once.   ondansetron 4 MG tablet Commonly known as: ZOFRAN TAKE 1 TABLET BY MOUTH 4 TIMES DAILY AS NEEDED What changed:   how much to take  how to take  this  when to take this  reasons to take this  additional instructions   pantoprazole 40 MG tablet Commonly known as: PROTONIX Take 1 tablet (40 mg total) by mouth 2 (two) times daily.   potassium chloride 10 MEQ tablet Commonly known as: KLOR-CON Take 10 mEq by mouth daily.   Proventil HFA 108 (90 Base) MCG/ACT inhaler Generic drug: albuterol Inhale 1-2 puffs into the lungs every 4 (four) hours as needed for wheezing or shortness of breath.   QUEtiapine 200 MG tablet Commonly known as: SEROQUEL TAKE 2 TABLETS BY MOUTH AT BEDTIME   rosuvastatin 40 MG tablet Commonly known as: CRESTOR Take 1 tablet (40 mg total) by mouth daily.   Trelegy Ellipta 100-62.5-25 MCG/INH Aepb Generic drug: Fluticasone-Umeclidin-Vilant Inhale 1 puff into the lungs 1 day or 1 dose.   Tylenol PM Extra Strength 25-500 MG Tabs tablet Generic drug: diphenhydramine-acetaminophen Take 2 tablets by mouth at bedtime.   VITAMIN B-12 PO Take 1  tablet by mouth daily.   cyanocobalamin 100 MCG tablet Take 100 mcg by mouth daily.   Vitamin D-3 125 MCG (5000 UT) Tabs Take 5,000 Units by mouth daily.        Objective:   BP 138/72   Pulse 81   Temp 97.7 F (36.5 C)   Ht '5\' 5"'  (1.651 m)   Wt 210 lb (95.3 kg)   SpO2 100%   BMI 34.95 kg/m   Wt Readings from Last 3 Encounters:  04/23/20 210 lb (95.3 kg)  01/18/20 188 lb (85.3 kg)  07/02/19 210 lb (95.3 kg)    Physical Exam Vitals and nursing note reviewed.  Constitutional:      General: She is not in acute distress.    Appearance: She is well-developed. She is not diaphoretic.  Eyes:     Conjunctiva/sclera: Conjunctivae normal.  Cardiovascular:     Rate and Rhythm: Normal rate and regular rhythm.     Heart sounds: Normal heart sounds. No murmur heard.   Pulmonary:     Effort: Pulmonary effort is normal. No respiratory distress.     Breath sounds: Wheezing and rhonchi present.  Musculoskeletal:        General: No tenderness. Normal  range of motion.  Skin:    General: Skin is warm and dry.     Findings: No rash.  Neurological:     Mental Status: She is alert and oriented to person, place, and time.     Coordination: Coordination normal.  Psychiatric:        Behavior: Behavior normal.       Assessment & Plan:   Problem List Items Addressed This Visit      Cardiovascular and Mediastinum   Essential hypertension   Relevant Orders   CMP14+EGFR (Completed)     Respiratory   COPD (chronic obstructive pulmonary disease) (HCC)   Relevant Medications   predniSONE (DELTASONE) 20 MG tablet     Endocrine   Hypothyroid - Primary   Relevant Orders   TSH (Completed)     Other   Hyperlipidemia LDL goal <100   Relevant Orders   CBC with Differential/Platelet (Completed)    Other Visit Diagnoses    Flu vaccine need       Relevant Orders   Flu Vaccine QUAD High Dose(Fluad) (Completed)   Postmenopausal       Relevant Orders   DG [facility] DEXA   Encounter for hepatitis C screening test for low risk patient       Relevant Orders   Hepatitis C antibody (Completed)   Wheelchair bound       Relevant Orders   Ambulatory referral to [facility]    Will give prednisone for breathing, also gave a sample of breast 3 breztri that she can try instead of her Trelegy and see if it does better for her, if it does better for her than we will consider sending in a prescription for it later.  Will refer to home health care for port management because she is wheelchair-bound and at home.  Follow up plan: Return in about 3 months (around 07/24/2020), or if symptoms worsen or fail to improve, for COPD and thyroid recheck.  Counseling provided for all of the vaccine components Orders Placed This Encounter  Procedures  . Flu Vaccine QUAD High Dose(Fluad)    Caryl Pina, MD [facility] 04/23/2020, 3:59 PM

## 2020-04-24 ENCOUNTER — Other Ambulatory Visit: Payer: Self-pay

## 2020-04-24 ENCOUNTER — Other Ambulatory Visit: Payer: Medicare Other

## 2020-04-24 DIAGNOSIS — D649 Anemia, unspecified: Secondary | ICD-10-CM

## 2020-04-24 LAB — CMP14+EGFR
ALT: 13 IU/L (ref 0–32)
AST: 20 IU/L (ref 0–40)
Albumin/Globulin Ratio: 1.4 (ref 1.2–2.2)
Albumin: 3.6 g/dL — ABNORMAL LOW (ref 3.8–4.8)
Alkaline Phosphatase: 65 IU/L (ref 44–121)
BUN/Creatinine Ratio: 20 (ref 12–28)
BUN: 19 mg/dL (ref 8–27)
Bilirubin Total: <0.2 mg/dL (ref 0.0–1.2)
CO2: 24 mmol/L (ref 20–29)
Calcium: 8.6 mg/dL — ABNORMAL LOW (ref 8.7–10.3)
Chloride: 105 mmol/L (ref 96–106)
Creatinine, Ser: 0.93 mg/dL (ref 0.57–1.00)
GFR calc Af Amer: 73 mL/min/{1.73_m2} (ref >59)
GFR calc non Af Amer: 63 mL/min/{1.73_m2} (ref >59)
Globulin, Total: 2.6 g/dL (ref 1.5–4.5)
Glucose: 134 mg/dL — ABNORMAL HIGH (ref 65–99)
Potassium: 4.9 mmol/L (ref 3.5–5.2)
Sodium: 141 mmol/L (ref 134–144)
Total Protein: 6.2 g/dL (ref 6.0–8.5)

## 2020-04-24 LAB — CBC WITH DIFFERENTIAL/PLATELET
Basophils Absolute: 0.1 10*3/uL (ref 0.0–0.2)
Basos: 1 %
EOS (ABSOLUTE): 0.2 10*3/uL (ref 0.0–0.4)
Eos: 3 %
Hematocrit: 25.2 % — ABNORMAL LOW (ref 34.0–46.6)
Hemoglobin: 7.2 g/dL — CL (ref 11.1–15.9)
Immature Grans (Abs): 0.1 10*3/uL (ref 0.0–0.1)
Immature Granulocytes: 1 %
Lymphocytes Absolute: 1.6 10*3/uL (ref 0.7–3.1)
Lymphs: 18 %
MCH: 23 pg — ABNORMAL LOW (ref 26.6–33.0)
MCHC: 28.6 g/dL — ABNORMAL LOW (ref 31.5–35.7)
MCV: 81 fL (ref 79–97)
Monocytes Absolute: 0.7 10*3/uL (ref 0.1–0.9)
Monocytes: 8 %
Neutrophils Absolute: 6.2 10*3/uL (ref 1.4–7.0)
Neutrophils: 69 %
Platelets: 210 10*3/uL (ref 150–450)
RBC: 3.13 x10E6/uL — ABNORMAL LOW (ref 3.77–5.28)
RDW: 17.7 % — ABNORMAL HIGH (ref 11.7–15.4)
WBC: 8.8 10*3/uL (ref 3.4–10.8)

## 2020-04-24 LAB — TSH: TSH: 1.48 u[IU]/mL (ref 0.450–4.500)

## 2020-04-24 LAB — HEPATITIS C ANTIBODY: Hep C Virus Ab: <0.1 s/co ratio (ref 0.0–0.9)

## 2020-04-24 NOTE — Addendum Note (Signed)
Addended by: Alphonzo Dublin on: 04/24/2020 03:22 PM   Modules accepted: Orders

## 2020-04-25 DIAGNOSIS — D124 Benign neoplasm of descending colon: Secondary | ICD-10-CM | POA: Diagnosis not present

## 2020-04-25 DIAGNOSIS — J9611 Chronic respiratory failure with hypoxia: Secondary | ICD-10-CM | POA: Diagnosis not present

## 2020-04-25 DIAGNOSIS — D62 Acute posthemorrhagic anemia: Secondary | ICD-10-CM | POA: Diagnosis not present

## 2020-04-25 DIAGNOSIS — K648 Other hemorrhoids: Secondary | ICD-10-CM | POA: Diagnosis not present

## 2020-04-25 DIAGNOSIS — D123 Benign neoplasm of transverse colon: Secondary | ICD-10-CM | POA: Diagnosis not present

## 2020-04-25 DIAGNOSIS — D125 Benign neoplasm of sigmoid colon: Secondary | ICD-10-CM | POA: Diagnosis not present

## 2020-04-25 DIAGNOSIS — K449 Diaphragmatic hernia without obstruction or gangrene: Secondary | ICD-10-CM | POA: Diagnosis not present

## 2020-04-25 DIAGNOSIS — R42 Dizziness and giddiness: Secondary | ICD-10-CM | POA: Diagnosis not present

## 2020-04-25 DIAGNOSIS — K219 Gastro-esophageal reflux disease without esophagitis: Secondary | ICD-10-CM | POA: Diagnosis not present

## 2020-04-25 DIAGNOSIS — D509 Iron deficiency anemia, unspecified: Secondary | ICD-10-CM | POA: Diagnosis not present

## 2020-04-25 DIAGNOSIS — F17210 Nicotine dependence, cigarettes, uncomplicated: Secondary | ICD-10-CM | POA: Diagnosis not present

## 2020-04-25 DIAGNOSIS — J449 Chronic obstructive pulmonary disease, unspecified: Secondary | ICD-10-CM | POA: Diagnosis not present

## 2020-04-25 DIAGNOSIS — E538 Deficiency of other specified B group vitamins: Secondary | ICD-10-CM | POA: Diagnosis not present

## 2020-04-25 DIAGNOSIS — Z79899 Other long term (current) drug therapy: Secondary | ICD-10-CM | POA: Diagnosis not present

## 2020-04-25 DIAGNOSIS — Z8711 Personal history of peptic ulcer disease: Secondary | ICD-10-CM | POA: Diagnosis not present

## 2020-04-25 DIAGNOSIS — Z8719 Personal history of other diseases of the digestive system: Secondary | ICD-10-CM | POA: Diagnosis not present

## 2020-04-25 DIAGNOSIS — G4730 Sleep apnea, unspecified: Secondary | ICD-10-CM | POA: Diagnosis not present

## 2020-04-25 DIAGNOSIS — E039 Hypothyroidism, unspecified: Secondary | ICD-10-CM | POA: Diagnosis not present

## 2020-04-25 DIAGNOSIS — K921 Melena: Secondary | ICD-10-CM | POA: Diagnosis not present

## 2020-04-26 DIAGNOSIS — D509 Iron deficiency anemia, unspecified: Secondary | ICD-10-CM | POA: Diagnosis not present

## 2020-04-26 DIAGNOSIS — Z8711 Personal history of peptic ulcer disease: Secondary | ICD-10-CM | POA: Diagnosis not present

## 2020-04-26 DIAGNOSIS — D123 Benign neoplasm of transverse colon: Secondary | ICD-10-CM | POA: Diagnosis not present

## 2020-04-26 DIAGNOSIS — K449 Diaphragmatic hernia without obstruction or gangrene: Secondary | ICD-10-CM | POA: Diagnosis not present

## 2020-04-26 DIAGNOSIS — J449 Chronic obstructive pulmonary disease, unspecified: Secondary | ICD-10-CM | POA: Diagnosis not present

## 2020-04-26 DIAGNOSIS — D124 Benign neoplasm of descending colon: Secondary | ICD-10-CM | POA: Diagnosis not present

## 2020-04-26 DIAGNOSIS — Z8719 Personal history of other diseases of the digestive system: Secondary | ICD-10-CM | POA: Diagnosis not present

## 2020-04-26 DIAGNOSIS — E039 Hypothyroidism, unspecified: Secondary | ICD-10-CM | POA: Diagnosis not present

## 2020-04-26 DIAGNOSIS — K648 Other hemorrhoids: Secondary | ICD-10-CM | POA: Diagnosis not present

## 2020-04-26 DIAGNOSIS — D500 Iron deficiency anemia secondary to blood loss (chronic): Secondary | ICD-10-CM | POA: Diagnosis not present

## 2020-04-26 DIAGNOSIS — Z79899 Other long term (current) drug therapy: Secondary | ICD-10-CM | POA: Diagnosis not present

## 2020-04-26 DIAGNOSIS — D125 Benign neoplasm of sigmoid colon: Secondary | ICD-10-CM | POA: Diagnosis not present

## 2020-04-26 DIAGNOSIS — F17210 Nicotine dependence, cigarettes, uncomplicated: Secondary | ICD-10-CM | POA: Diagnosis not present

## 2020-04-26 DIAGNOSIS — E538 Deficiency of other specified B group vitamins: Secondary | ICD-10-CM | POA: Diagnosis not present

## 2020-04-26 DIAGNOSIS — K219 Gastro-esophageal reflux disease without esophagitis: Secondary | ICD-10-CM | POA: Diagnosis not present

## 2020-04-26 DIAGNOSIS — J9611 Chronic respiratory failure with hypoxia: Secondary | ICD-10-CM | POA: Diagnosis not present

## 2020-04-26 DIAGNOSIS — D62 Acute posthemorrhagic anemia: Secondary | ICD-10-CM | POA: Diagnosis not present

## 2020-04-26 DIAGNOSIS — K921 Melena: Secondary | ICD-10-CM | POA: Diagnosis not present

## 2020-04-27 DIAGNOSIS — Z79899 Other long term (current) drug therapy: Secondary | ICD-10-CM | POA: Diagnosis not present

## 2020-04-27 DIAGNOSIS — D124 Benign neoplasm of descending colon: Secondary | ICD-10-CM | POA: Diagnosis not present

## 2020-04-27 DIAGNOSIS — F17210 Nicotine dependence, cigarettes, uncomplicated: Secondary | ICD-10-CM | POA: Diagnosis not present

## 2020-04-27 DIAGNOSIS — K921 Melena: Secondary | ICD-10-CM | POA: Diagnosis not present

## 2020-04-27 DIAGNOSIS — D509 Iron deficiency anemia, unspecified: Secondary | ICD-10-CM | POA: Diagnosis not present

## 2020-04-27 DIAGNOSIS — E039 Hypothyroidism, unspecified: Secondary | ICD-10-CM | POA: Diagnosis not present

## 2020-04-27 DIAGNOSIS — K219 Gastro-esophageal reflux disease without esophagitis: Secondary | ICD-10-CM | POA: Diagnosis not present

## 2020-04-27 DIAGNOSIS — K449 Diaphragmatic hernia without obstruction or gangrene: Secondary | ICD-10-CM | POA: Diagnosis not present

## 2020-04-27 DIAGNOSIS — D125 Benign neoplasm of sigmoid colon: Secondary | ICD-10-CM | POA: Diagnosis not present

## 2020-04-27 DIAGNOSIS — J9611 Chronic respiratory failure with hypoxia: Secondary | ICD-10-CM | POA: Diagnosis not present

## 2020-04-27 DIAGNOSIS — Z8719 Personal history of other diseases of the digestive system: Secondary | ICD-10-CM | POA: Diagnosis not present

## 2020-04-27 DIAGNOSIS — Z8711 Personal history of peptic ulcer disease: Secondary | ICD-10-CM | POA: Diagnosis not present

## 2020-04-27 DIAGNOSIS — D123 Benign neoplasm of transverse colon: Secondary | ICD-10-CM | POA: Diagnosis not present

## 2020-04-27 DIAGNOSIS — E538 Deficiency of other specified B group vitamins: Secondary | ICD-10-CM | POA: Diagnosis not present

## 2020-04-27 DIAGNOSIS — Z9981 Dependence on supplemental oxygen: Secondary | ICD-10-CM | POA: Diagnosis not present

## 2020-04-27 DIAGNOSIS — D500 Iron deficiency anemia secondary to blood loss (chronic): Secondary | ICD-10-CM | POA: Diagnosis not present

## 2020-04-27 DIAGNOSIS — J449 Chronic obstructive pulmonary disease, unspecified: Secondary | ICD-10-CM | POA: Diagnosis not present

## 2020-04-27 DIAGNOSIS — D62 Acute posthemorrhagic anemia: Secondary | ICD-10-CM | POA: Diagnosis not present

## 2020-04-27 DIAGNOSIS — K648 Other hemorrhoids: Secondary | ICD-10-CM | POA: Diagnosis not present

## 2020-04-28 DIAGNOSIS — K648 Other hemorrhoids: Secondary | ICD-10-CM | POA: Diagnosis not present

## 2020-04-28 DIAGNOSIS — Z8719 Personal history of other diseases of the digestive system: Secondary | ICD-10-CM | POA: Diagnosis not present

## 2020-04-28 DIAGNOSIS — D62 Acute posthemorrhagic anemia: Secondary | ICD-10-CM | POA: Diagnosis not present

## 2020-04-28 DIAGNOSIS — D509 Iron deficiency anemia, unspecified: Secondary | ICD-10-CM | POA: Diagnosis not present

## 2020-04-28 DIAGNOSIS — K449 Diaphragmatic hernia without obstruction or gangrene: Secondary | ICD-10-CM | POA: Diagnosis not present

## 2020-04-28 DIAGNOSIS — R195 Other fecal abnormalities: Secondary | ICD-10-CM | POA: Diagnosis not present

## 2020-04-28 DIAGNOSIS — D123 Benign neoplasm of transverse colon: Secondary | ICD-10-CM | POA: Diagnosis not present

## 2020-04-28 DIAGNOSIS — E538 Deficiency of other specified B group vitamins: Secondary | ICD-10-CM | POA: Diagnosis not present

## 2020-04-28 DIAGNOSIS — J9611 Chronic respiratory failure with hypoxia: Secondary | ICD-10-CM | POA: Diagnosis not present

## 2020-04-28 DIAGNOSIS — J449 Chronic obstructive pulmonary disease, unspecified: Secondary | ICD-10-CM | POA: Diagnosis not present

## 2020-04-28 DIAGNOSIS — F17210 Nicotine dependence, cigarettes, uncomplicated: Secondary | ICD-10-CM | POA: Diagnosis not present

## 2020-04-28 DIAGNOSIS — D124 Benign neoplasm of descending colon: Secondary | ICD-10-CM | POA: Diagnosis not present

## 2020-04-28 DIAGNOSIS — Z9981 Dependence on supplemental oxygen: Secondary | ICD-10-CM | POA: Diagnosis not present

## 2020-04-28 DIAGNOSIS — D125 Benign neoplasm of sigmoid colon: Secondary | ICD-10-CM | POA: Diagnosis not present

## 2020-04-28 DIAGNOSIS — K921 Melena: Secondary | ICD-10-CM | POA: Diagnosis not present

## 2020-04-28 DIAGNOSIS — Z79899 Other long term (current) drug therapy: Secondary | ICD-10-CM | POA: Diagnosis not present

## 2020-04-28 DIAGNOSIS — D649 Anemia, unspecified: Secondary | ICD-10-CM | POA: Diagnosis not present

## 2020-04-28 DIAGNOSIS — Z8711 Personal history of peptic ulcer disease: Secondary | ICD-10-CM | POA: Diagnosis not present

## 2020-04-28 DIAGNOSIS — K219 Gastro-esophageal reflux disease without esophagitis: Secondary | ICD-10-CM | POA: Diagnosis not present

## 2020-04-28 DIAGNOSIS — E039 Hypothyroidism, unspecified: Secondary | ICD-10-CM | POA: Diagnosis not present

## 2020-04-28 LAB — CBC WITH DIFFERENTIAL/PLATELET
Basophils Absolute: 0 10*3/uL (ref 0.0–0.2)
Basos: 1 %
EOS (ABSOLUTE): 0.3 10*3/uL (ref 0.0–0.4)
Eos: 3 %
Hematocrit: 23 % — ABNORMAL LOW (ref 34.0–46.6)
Hemoglobin: 6.6 g/dL — CL (ref 11.1–15.9)
Immature Grans (Abs): 0.1 10*3/uL (ref 0.0–0.1)
Immature Granulocytes: 1 %
Lymphocytes Absolute: 1.4 10*3/uL (ref 0.7–3.1)
Lymphs: 16 %
MCH: 23.5 pg — ABNORMAL LOW (ref 26.6–33.0)
MCHC: 28.7 g/dL — ABNORMAL LOW (ref 31.5–35.7)
MCV: 82 fL (ref 79–97)
Monocytes Absolute: 0.7 10*3/uL (ref 0.1–0.9)
Monocytes: 8 %
Neutrophils Absolute: 6.3 10*3/uL (ref 1.4–7.0)
Neutrophils: 71 %
Platelets: 201 10*3/uL (ref 150–450)
RBC: 2.81 x10E6/uL — ABNORMAL LOW (ref 3.77–5.28)
RDW: 17.6 % — ABNORMAL HIGH (ref 11.7–15.4)
WBC: 8.8 10*3/uL (ref 3.4–10.8)

## 2020-04-28 LAB — HEMOGLOBIN, FINGERSTICK

## 2020-04-29 ENCOUNTER — Telehealth: Payer: Self-pay | Admitting: Family Medicine

## 2020-04-29 DIAGNOSIS — D62 Acute posthemorrhagic anemia: Secondary | ICD-10-CM | POA: Diagnosis not present

## 2020-04-29 DIAGNOSIS — R531 Weakness: Secondary | ICD-10-CM | POA: Diagnosis not present

## 2020-04-29 NOTE — Telephone Encounter (Signed)
TRANSITIONAL CARE MANAGEMENT TELEPHONE OUTREACH NOTE   Contact Date: 04/29/2020 Contacted By: [place] INFORMATION Date of Discharge:04/28/2020 Discharge Facility: [facility] Principal Discharge Diagnosis:Acute posthemorrhagic anemia   Outpatient Follow Up Recommendations (copied from discharge summary) Follow with primary care physician in 1 week. CBC, BMP in 1 week. Follow with GI as outpatient to have outpatient small bowel capsule endoscopy  Continue CPAP at night. Continue 3 L of nasal cannula    Tasha Clarke is a female primary care patient of Dettinger, Fransisca Kaufmann, MD. An outgoing telephone call was made today and I spoke with Tasha Clarke.  Tasha Clarke condition(s) and treatment(s) were discussed. An opportunity to ask questions was provided and all were answered or forwarded as appropriate.    ACTIVITIES OF DAILY LIVING  Tasha Clarke lives with their family and she cannot perform ADLs independently. her primary caregiver is herself and family. she is able to depend on her primary caregiver(s) for consistent help. Transportation to appointments, to pick up medications, and to run errands is not a problem.  (Consider referral to Tasha Clarke if transportation or a consistent caregiver is a problem)   Fall Risk Fall Risk  04/23/2020 01/18/2020  Falls in the past year? 1 1  Number falls in past yr: 1 1  Injury with Fall? 1 0  Comment - -  Risk for fall due to : Impaired balance/gait Impaired balance/gait  Follow up Falls evaluation completed Falls evaluation completed    high [place] Modifications/Assistive Devices Wheelchair: No Cane: No Ramp: No Bedside Toilet: No Hospital Bed:  No Other: rolling walker   [place] she is not receiving home health services.     MEDICATION RECONCILIATION  Tasha Clarke has been able to pick-up all prescribed discharge medications from the pharmacy.    A post discharge medication reconciliation was performed and the complete medication list was reviewed with the patient/caregiver and is current as of 04/29/2020. Changes highlighted below.  Discontinued Medications   Current Medication List Allergies as of 04/29/2020      Reactions   Iodinated Diagnostic Agents Anaphylaxis   Coded   Cephalexin Hives   Imdur [isosorbide Dinitrate] Nausea And Vomiting   Ketorolac Tromethamine Hives   Lac Bovis Nausea And Vomiting   Other Other (See Comments)   Alka seltzer causes blurred vision, fainting   Milk-related Compounds Nausea And Vomiting   Sodium Bicarbonate-citric Acid Nausea And Vomiting      Medication List       Accurate as of April 29, 2020  9:35 AM. If you have any questions, ask your nurse or doctor.        alendronate 70 MG tablet Commonly known as: FOSAMAX Take 1 tablet (70 mg total) by mouth every Monday. Take with a full glass of water on an empty stomach.   buprenorphine 20 MCG/HR Ptwk Commonly known as: BUTRANS Place 1 patch onto the skin once a week. Sunday   cetirizine 10 MG tablet Commonly known as: EQ Allergy Relief (Cetirizine) Take 1 tablet (10 mg total) by mouth daily.   cyclobenzaprine 10 MG tablet Commonly known as: FLEXERIL Take 1 tablet (10 mg total) by mouth at bedtime.   dicyclomine 10 MG capsule Commonly known as: BENTYL Take 1 capsule (10 mg total) by mouth 4 (four) times daily as needed (for diarrhea).   DULoxetine 60 MG capsule Commonly known as: CYMBALTA Take 2 capsules (120 mg total)  by mouth daily.   EPINEPHrine 0.3 mg/0.3 mL Soaj injection Commonly known as: EPI-PEN Inject 0.3 mLs (0.3 mg total) into the muscle as needed.   Fish Oil 1000 MG Caps Take 1,000 mg by mouth daily.   fluticasone 50 MCG/ACT nasal spray Commonly known as: Flonase Place 2 sprays into both nostrils daily.   folic acid 1 MG tablet Commonly known as: FOLVITE Take 1 tablet (1 mg total) by mouth  daily.   gabapentin 600 MG tablet Commonly known as: NEURONTIN Take 1 tablet (600 mg total) by mouth in the morning, at noon, in the evening, and at bedtime.   hydrOXYzine 25 MG tablet Commonly known as: ATARAX/VISTARIL Take 1 tablet (25 mg total) by mouth 3 (three) times daily as needed.   ipratropium-albuterol 0.5-2.5 (3) MG/3ML Soln Commonly known as: DUONEB Take 3 mLs by nebulization every 6 (six) hours as needed. (Needs to be seen before next refill)   Iron 325 (65 Fe) MG Tabs Take 1 tablet (325 mg total) by mouth daily.   levETIRAcetam 1000 MG tablet Commonly known as: KEPPRA Take 1 tablet (1,000 mg total) by mouth at bedtime.   levETIRAcetam 750 MG tablet Commonly known as: KEPPRA Take 1 tablet (750 mg total) by mouth every morning.   levothyroxine 50 MCG tablet Commonly known as: SYNTHROID Take 1 tablet (50 mcg total) by mouth daily before breakfast.   Magnesium 400 MG Caps Take 400 mg by mouth daily.   Melatonin 10 MG Tabs Take 10 mg by mouth at bedtime.   metoprolol tartrate 25 MG tablet Commonly known as: LOPRESSOR Take 12.5 mg by mouth 2 (two) times daily.   multivitamin tablet Take 1 tablet by mouth daily.   Narcan 4 MG/0.1ML Liqd nasal spray kit Generic drug: naloxone Place 0.4 mg into the nose once.   ondansetron 4 MG tablet Commonly known as: ZOFRAN TAKE 1 TABLET BY MOUTH 4 TIMES DAILY AS NEEDED What changed:   how much to take  how to take this  when to take this  reasons to take this  additional instructions   pantoprazole 40 MG tablet Commonly known as: PROTONIX Take 1 tablet (40 mg total) by mouth 2 (two) times daily.   potassium chloride 10 MEQ tablet Commonly known as: KLOR-CON Take 10 mEq by mouth daily.   predniSONE 20 MG tablet Commonly known as: DELTASONE Take 3 tabs daily for 1 week, then 2 tabs daily for week 2, then 1 tab daily for week 3.   Proventil HFA 108 (90 Base) MCG/ACT inhaler Generic drug:  albuterol Inhale 1-2 puffs into the lungs every 4 (four) hours as needed for wheezing or shortness of breath.   QUEtiapine 200 MG tablet Commonly known as: SEROQUEL TAKE 2 TABLETS BY MOUTH AT BEDTIME   rosuvastatin 40 MG tablet Commonly known as: CRESTOR Take 1 tablet (40 mg total) by mouth daily.   Trelegy Ellipta 100-62.5-25 MCG/INH Aepb Generic drug: Fluticasone-Umeclidin-Vilant Inhale 1 puff into the lungs 1 day or 1 dose.   Tylenol PM Extra Strength 25-500 MG Tabs tablet Generic drug: diphenhydramine-acetaminophen Take 2 tablets by mouth at bedtime.   VITAMIN B-12 PO Take 1 tablet by mouth daily.   cyanocobalamin 100 MCG tablet Take 100 mcg by mouth daily.   Vitamin D-3 125 MCG (5000 UT) Tabs Take 5,000 Units by mouth daily.        PATIENT EDUCATION & FOLLOW-UP PLAN  An appointment for Transitional Care Management is scheduled with Evelina Dun, FNP on  05/05/20 at 2:10.  Take all medications as prescribed  Contact our office by calling [phone] if you have any questions or concerns

## 2020-05-01 DIAGNOSIS — G8929 Other chronic pain: Secondary | ICD-10-CM | POA: Diagnosis not present

## 2020-05-01 DIAGNOSIS — M5416 Radiculopathy, lumbar region: Secondary | ICD-10-CM | POA: Diagnosis not present

## 2020-05-01 DIAGNOSIS — F17210 Nicotine dependence, cigarettes, uncomplicated: Secondary | ICD-10-CM | POA: Diagnosis not present

## 2020-05-01 DIAGNOSIS — Z79899 Other long term (current) drug therapy: Secondary | ICD-10-CM | POA: Diagnosis not present

## 2020-05-05 ENCOUNTER — Ambulatory Visit: Payer: Medicare Other | Admitting: Family

## 2020-05-05 ENCOUNTER — Other Ambulatory Visit: Payer: Medicare Other

## 2020-05-07 ENCOUNTER — Encounter: Payer: Self-pay | Admitting: Family Medicine

## 2020-05-11 IMAGING — MG DIGITAL SCREENING BILATERAL MAMMOGRAM WITH TOMO AND CAD
6 of 12 series · 6 of 36 positions shown · non-contrast
Comparison: None.

CLINICAL DATA: Screening.

EXAM:
DIGITAL SCREENING BILATERAL MAMMOGRAM WITH TOMO AND CAD

[L MLO synth-2D]
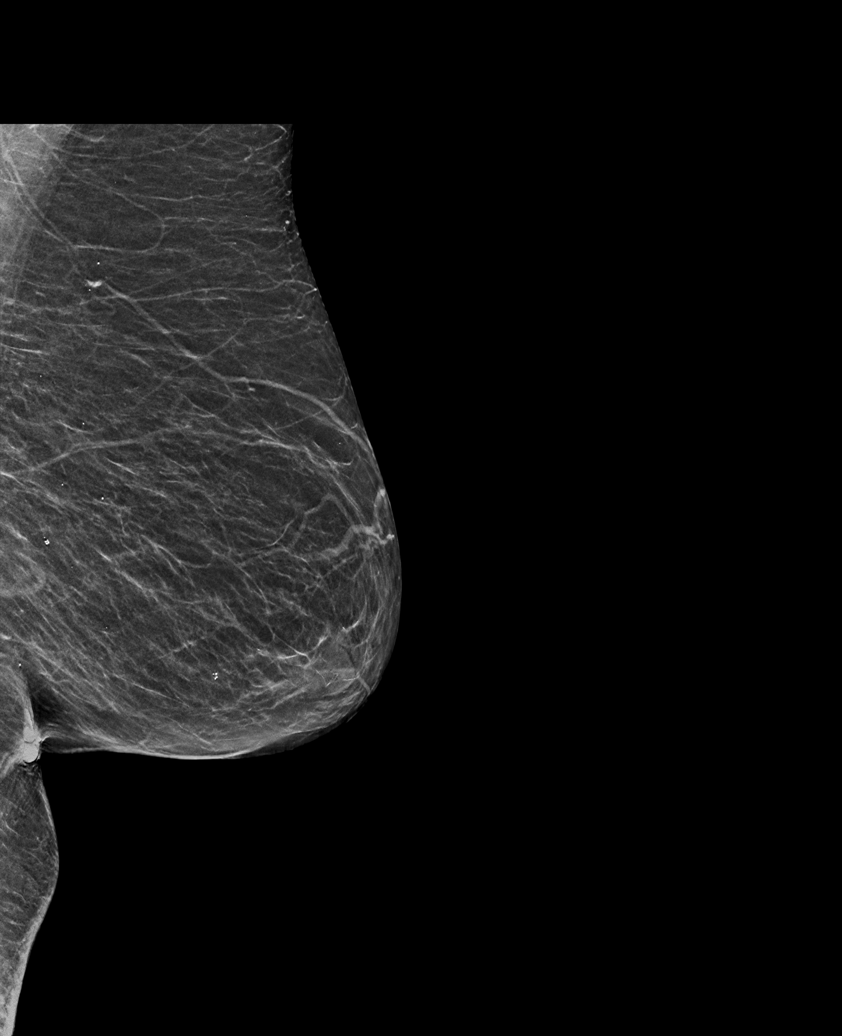

[R MLO synth-2D]
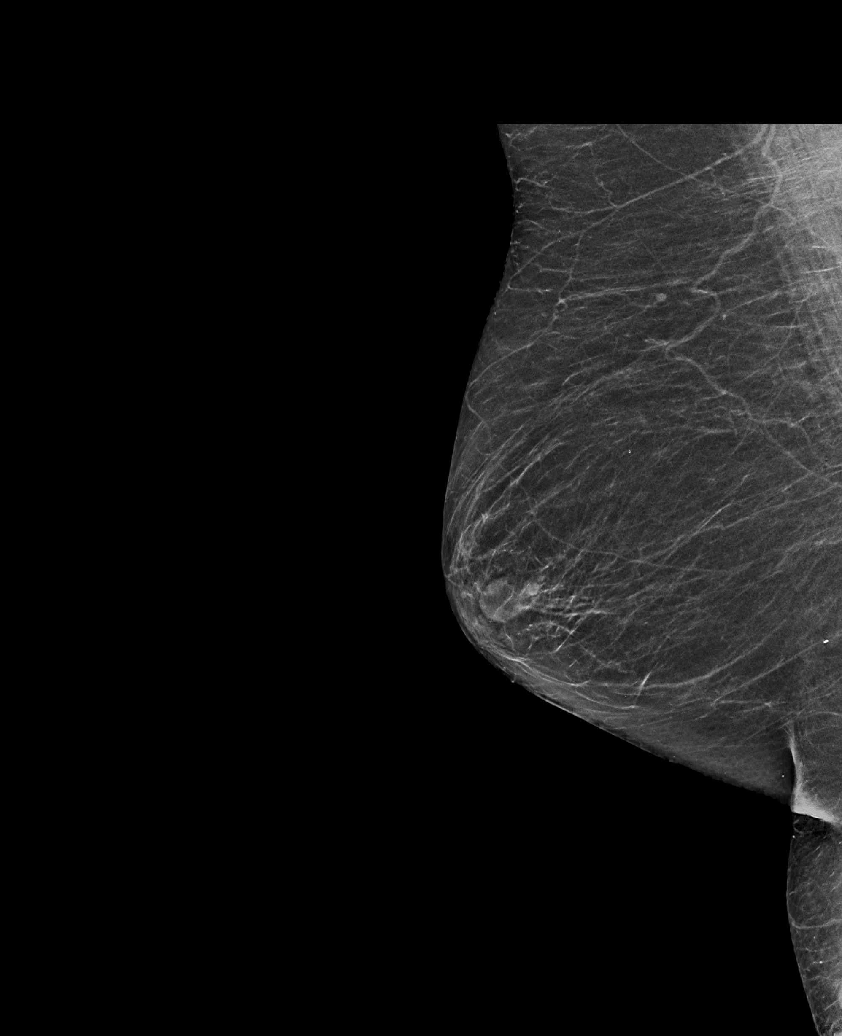

[R CC synth-2D (1 of 2)]
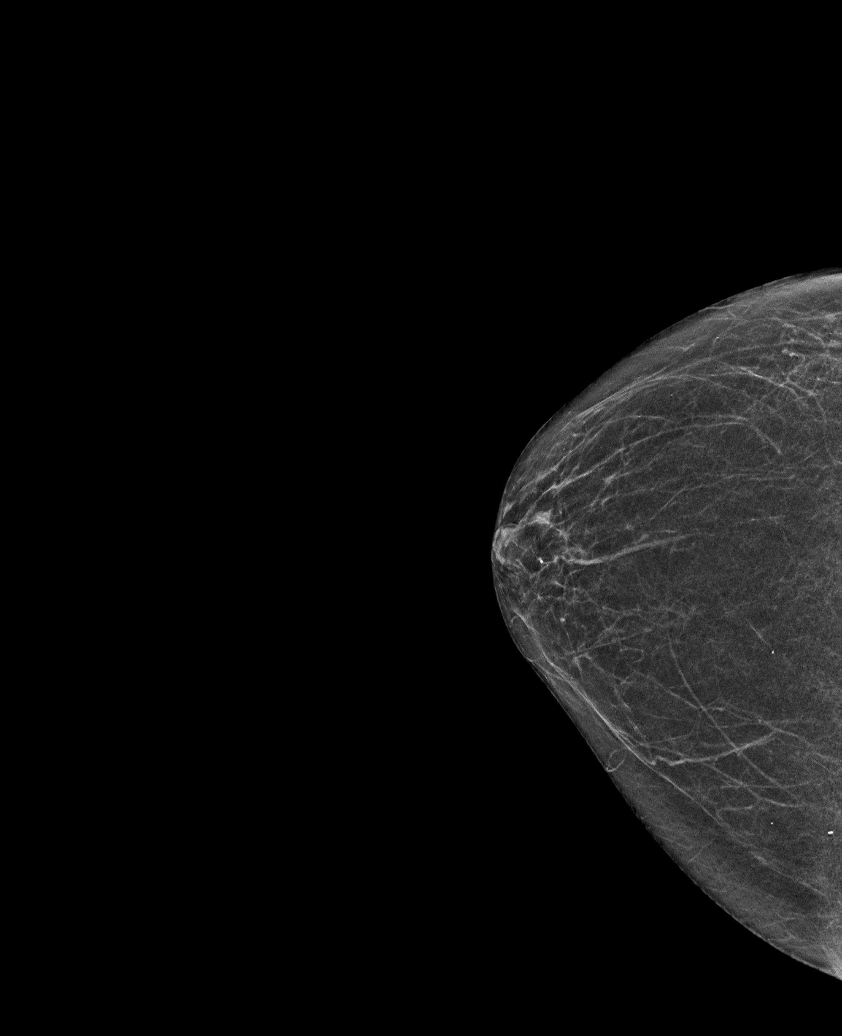

[L CC synth-2D (1 of 2)]
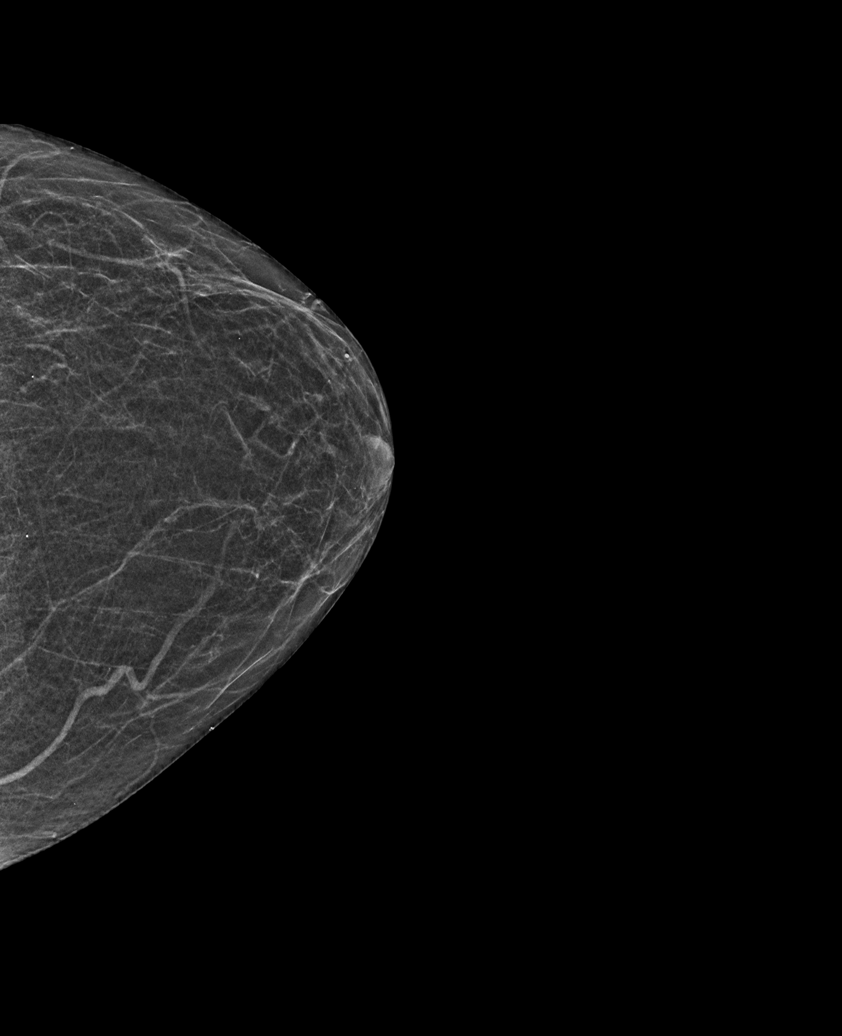

[L CC synth-2D (2 of 2)]
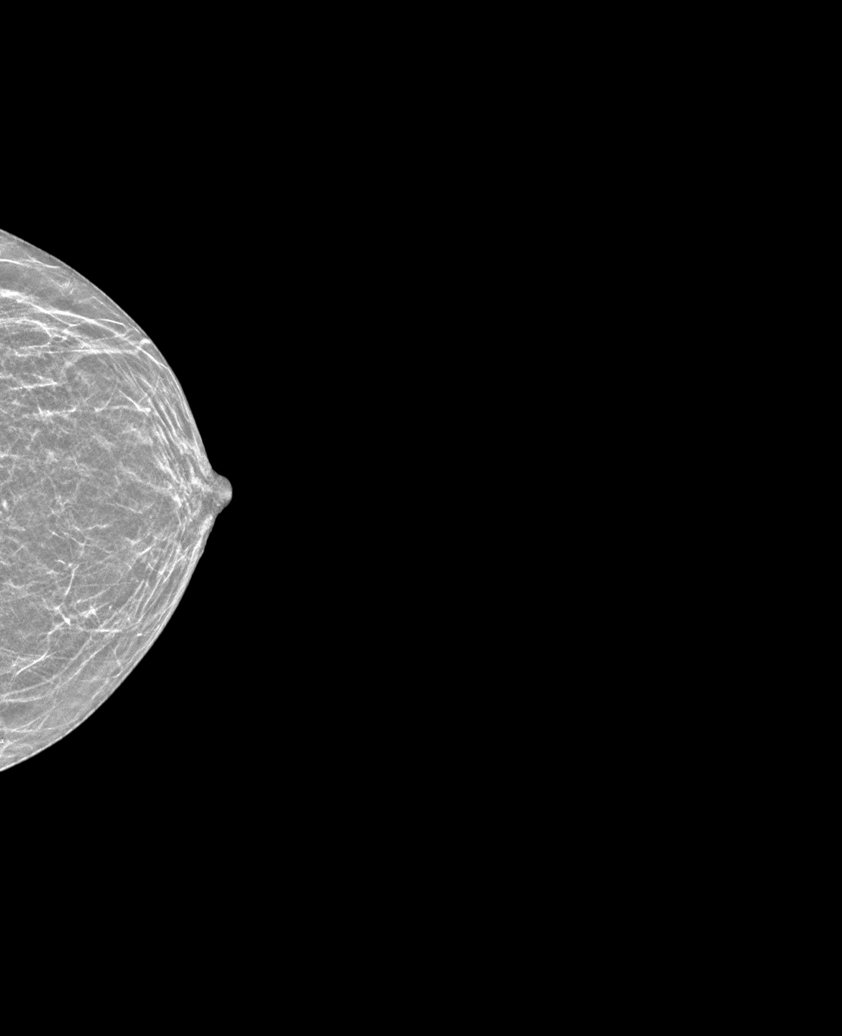

[R CC synth-2D (2 of 2)]
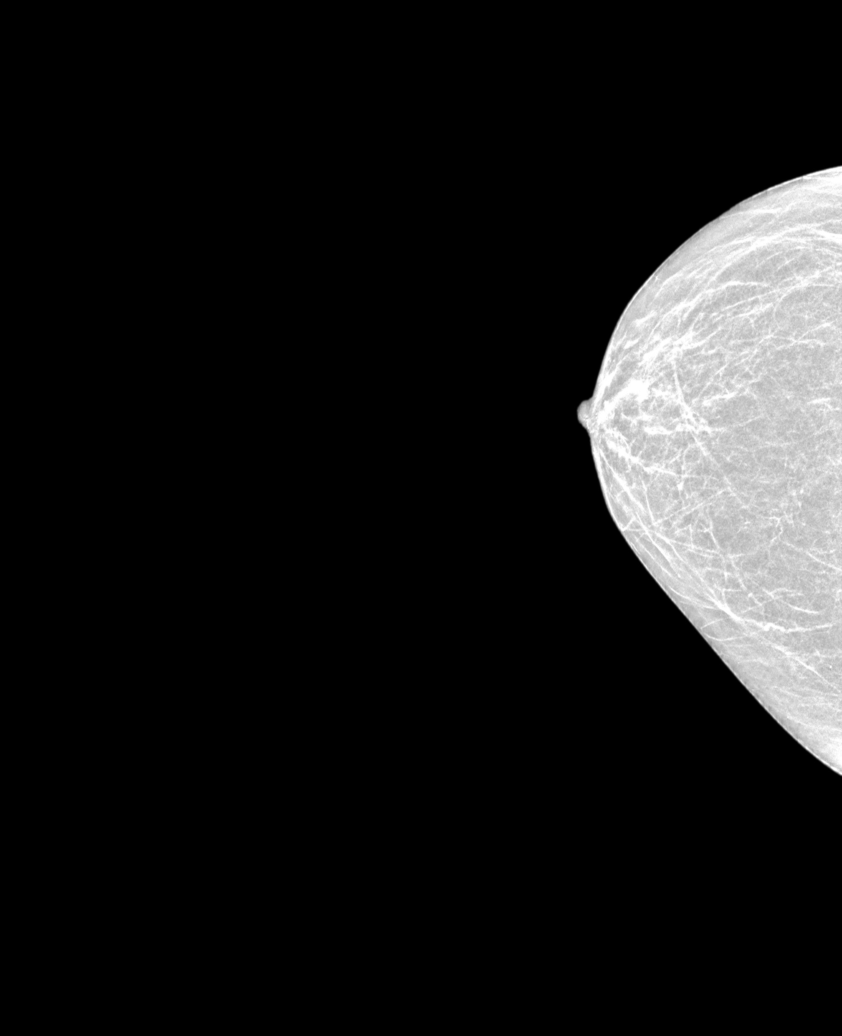

[6 of 36 positions shown; findings below may reference images not displayed]

ACR Breast Density Category b: There are scattered areas of
fibroglandular density.
FINDINGS: In the right breast, a possible mass warrants further evaluation. In
the left breast, no findings suspicious for malignancy. Images were
processed with CAD.
IMPRESSION: Further evaluation is suggested for possible mass in the right
breast.

RECOMMENDATION:
Ultrasound of the right breast. (Code:[id number])

The patient will be contacted regarding the findings, and additional
imaging will be scheduled.

BI-RADS CATEGORY  0: Incomplete. Need additional imaging evaluation
and/or prior mammograms for comparison.

## 2020-05-23 ENCOUNTER — Ambulatory Visit: Payer: Medicare Other | Admitting: Family Medicine

## 2020-05-27 ENCOUNTER — Inpatient Hospital Stay: Payer: Medicare Other | Admitting: Nurse Practitioner

## 2020-05-27 ENCOUNTER — Telehealth: Payer: Self-pay

## 2020-05-27 NOTE — Telephone Encounter (Signed)
Spoke with patient, appointment scheduled on 06/03/2020 at 3:00 pm with Shelah Lewandowsky.  Dr. Warrick Parisian has no appointments available.

## 2020-05-28 ENCOUNTER — Encounter: Payer: Self-pay | Admitting: Family Medicine

## 2020-05-29 DIAGNOSIS — J90 Pleural effusion, not elsewhere classified: Secondary | ICD-10-CM | POA: Diagnosis not present

## 2020-05-29 DIAGNOSIS — J811 Chronic pulmonary edema: Secondary | ICD-10-CM | POA: Diagnosis not present

## 2020-05-29 DIAGNOSIS — R059 Cough, unspecified: Secondary | ICD-10-CM | POA: Diagnosis not present

## 2020-05-29 DIAGNOSIS — Z8709 Personal history of other diseases of the respiratory system: Secondary | ICD-10-CM | POA: Diagnosis not present

## 2020-05-29 DIAGNOSIS — I493 Ventricular premature depolarization: Secondary | ICD-10-CM | POA: Diagnosis not present

## 2020-05-29 DIAGNOSIS — R55 Syncope and collapse: Secondary | ICD-10-CM | POA: Diagnosis not present

## 2020-05-29 DIAGNOSIS — Z9981 Dependence on supplemental oxygen: Secondary | ICD-10-CM | POA: Diagnosis not present

## 2020-05-29 DIAGNOSIS — R9431 Abnormal electrocardiogram [ECG] [EKG]: Secondary | ICD-10-CM | POA: Diagnosis not present

## 2020-05-29 DIAGNOSIS — R296 Repeated falls: Secondary | ICD-10-CM | POA: Diagnosis not present

## 2020-05-29 DIAGNOSIS — R509 Fever, unspecified: Secondary | ICD-10-CM | POA: Diagnosis not present

## 2020-05-29 DIAGNOSIS — D649 Anemia, unspecified: Secondary | ICD-10-CM | POA: Diagnosis not present

## 2020-05-30 DIAGNOSIS — R9431 Abnormal electrocardiogram [ECG] [EKG]: Secondary | ICD-10-CM | POA: Diagnosis not present

## 2020-06-03 ENCOUNTER — Encounter: Payer: Self-pay | Admitting: Nurse Practitioner

## 2020-06-03 ENCOUNTER — Ambulatory Visit (INDEPENDENT_AMBULATORY_CARE_PROVIDER_SITE_OTHER): Payer: Medicare Other | Admitting: Nurse Practitioner

## 2020-06-03 DIAGNOSIS — R079 Chest pain, unspecified: Secondary | ICD-10-CM | POA: Diagnosis not present

## 2020-06-03 DIAGNOSIS — D649 Anemia, unspecified: Secondary | ICD-10-CM

## 2020-06-03 DIAGNOSIS — M6281 Muscle weakness (generalized): Secondary | ICD-10-CM | POA: Diagnosis not present

## 2020-06-03 DIAGNOSIS — R296 Repeated falls: Secondary | ICD-10-CM | POA: Diagnosis not present

## 2020-06-03 DIAGNOSIS — R5383 Other fatigue: Secondary | ICD-10-CM | POA: Diagnosis not present

## 2020-06-03 DIAGNOSIS — W010XXA Fall on same level from slipping, tripping and stumbling without subsequent striking against object, initial encounter: Secondary | ICD-10-CM | POA: Diagnosis not present

## 2020-06-03 DIAGNOSIS — I517 Cardiomegaly: Secondary | ICD-10-CM | POA: Diagnosis not present

## 2020-06-03 DIAGNOSIS — I493 Ventricular premature depolarization: Secondary | ICD-10-CM | POA: Diagnosis not present

## 2020-06-03 DIAGNOSIS — Z043 Encounter for examination and observation following other accident: Secondary | ICD-10-CM | POA: Diagnosis not present

## 2020-06-03 DIAGNOSIS — R531 Weakness: Secondary | ICD-10-CM | POA: Diagnosis not present

## 2020-06-03 DIAGNOSIS — R9431 Abnormal electrocardiogram [ECG] [EKG]: Secondary | ICD-10-CM | POA: Diagnosis not present

## 2020-06-03 DIAGNOSIS — Y999 Unspecified external cause status: Secondary | ICD-10-CM | POA: Diagnosis not present

## 2020-06-03 DIAGNOSIS — S0990XA Unspecified injury of head, initial encounter: Secondary | ICD-10-CM | POA: Diagnosis not present

## 2020-06-03 NOTE — Progress Notes (Signed)
   Virtual Visit via telephone Note Due to COVID-19 pandemic this visit was conducted virtually. This visit type was conducted due to national recommendations for restrictions regarding the COVID-19 Pandemic (e.g. social distancing, sheltering in place) in an effort to limit this patient's exposure and mitigate transmission in our community. All issues noted in this document were discussed and addressed.  A physical exam was not performed with this format.  I connected with Tasha Clarke on 06/03/20 at 9:50 by telephone and verified that I am speaking with the correct person using two identifiers. Tasha Clarke is currently located at home and not sure who is currently with her during visit. The provider, Mary-Margaret Hassell Done, FNP is located in their office at time of visit.  I discussed the limitations, risks, security and privacy concerns of performing an evaluation and management service by telephone and the availability of in person appointments. I also discussed with the patient that there may be a patient responsible charge related to this service. The patient expressed understanding and agreed to proceed.   History and Present Illness:   Chief Complaint: Anemia   HPI Patient scheduled for telephone visit. She says she feels terrible. She has no energy and can hardly seat up. She went to ED at [facility] and had labs done but left before recieving care. Her hemoglobin then was 6.6 and she was not treated.   Review of Systems  Constitutional: Positive for malaise/fatigue. Negative for chills.  Respiratory: Positive for shortness of breath.   Cardiovascular: Negative.   Neurological: Positive for headaches.     Observations/Objective: Alert and oriented- answers all questions appropriately No distress Voice quivering   Assessment and Plan: Tasha Clarke in today with chief complaint of Anemia   1. Anemia, unspecified type Patient was  told to immediately go back to the hospital- she agreed and said she would call EMS to take her. Discussed how critical a hgb of 6.6 is.    Follow Up Instructions: In er asap    I discussed the assessment and treatment plan with the patient. The patient was provided an opportunity to ask questions and all were answered. The patient agreed with the plan and demonstrated an understanding of the instructions.   The patient was advised to call back or seek an in-person evaluation if the symptoms worsen or if the condition fails to improve as anticipated.  The above assessment and management plan was discussed with the patient. The patient verbalized understanding of and has agreed to the management plan. Patient is aware to call the clinic if symptoms persist or worsen. Patient is aware when to return to the clinic for a follow-up visit. Patient educated on when it is appropriate to go to the emergency department.   Time call ended:  10:03  I provided 13 minutes of non-face-to-face time during this encounter.    Mary-Margaret Hassell Done, FNP

## 2020-06-06 DIAGNOSIS — M5416 Radiculopathy, lumbar region: Secondary | ICD-10-CM | POA: Diagnosis not present

## 2020-06-06 DIAGNOSIS — Z79891 Long term (current) use of opiate analgesic: Secondary | ICD-10-CM | POA: Diagnosis not present

## 2020-06-06 DIAGNOSIS — M5412 Radiculopathy, cervical region: Secondary | ICD-10-CM | POA: Diagnosis not present

## 2020-06-06 DIAGNOSIS — Z79899 Other long term (current) drug therapy: Secondary | ICD-10-CM | POA: Diagnosis not present

## 2020-06-06 DIAGNOSIS — G8929 Other chronic pain: Secondary | ICD-10-CM | POA: Diagnosis not present

## 2020-06-06 DIAGNOSIS — F17210 Nicotine dependence, cigarettes, uncomplicated: Secondary | ICD-10-CM | POA: Diagnosis not present

## 2020-06-09 ENCOUNTER — Other Ambulatory Visit: Payer: Self-pay | Admitting: Family Medicine

## 2020-06-09 DIAGNOSIS — F419 Anxiety disorder, unspecified: Secondary | ICD-10-CM

## 2020-06-09 DIAGNOSIS — F339 Major depressive disorder, recurrent, unspecified: Secondary | ICD-10-CM

## 2020-07-15 DIAGNOSIS — Z79899 Other long term (current) drug therapy: Secondary | ICD-10-CM | POA: Diagnosis not present

## 2020-07-15 DIAGNOSIS — F411 Generalized anxiety disorder: Secondary | ICD-10-CM | POA: Diagnosis not present

## 2020-07-15 DIAGNOSIS — F333 Major depressive disorder, recurrent, severe with psychotic symptoms: Secondary | ICD-10-CM | POA: Diagnosis not present

## 2020-07-15 DIAGNOSIS — Z1339 Encounter for screening examination for other mental health and behavioral disorders: Secondary | ICD-10-CM | POA: Diagnosis not present

## 2020-07-15 DIAGNOSIS — E559 Vitamin D deficiency, unspecified: Secondary | ICD-10-CM | POA: Diagnosis not present

## 2020-07-15 DIAGNOSIS — R5383 Other fatigue: Secondary | ICD-10-CM | POA: Diagnosis not present

## 2020-07-21 DIAGNOSIS — G8929 Other chronic pain: Secondary | ICD-10-CM | POA: Diagnosis not present

## 2020-07-21 DIAGNOSIS — F17210 Nicotine dependence, cigarettes, uncomplicated: Secondary | ICD-10-CM | POA: Diagnosis not present

## 2020-07-21 DIAGNOSIS — Z79891 Long term (current) use of opiate analgesic: Secondary | ICD-10-CM | POA: Diagnosis not present

## 2020-07-21 DIAGNOSIS — F1120 Opioid dependence, uncomplicated: Secondary | ICD-10-CM | POA: Diagnosis not present

## 2020-07-21 DIAGNOSIS — Z79899 Other long term (current) drug therapy: Secondary | ICD-10-CM | POA: Diagnosis not present

## 2020-07-21 DIAGNOSIS — M5416 Radiculopathy, lumbar region: Secondary | ICD-10-CM | POA: Diagnosis not present

## 2020-07-24 ENCOUNTER — Other Ambulatory Visit: Payer: Medicare Other

## 2020-07-24 ENCOUNTER — Ambulatory Visit: Payer: Medicare Other | Admitting: Family Medicine

## 2020-07-29 DIAGNOSIS — M25521 Pain in right elbow: Secondary | ICD-10-CM | POA: Diagnosis not present

## 2020-07-29 DIAGNOSIS — R55 Syncope and collapse: Secondary | ICD-10-CM | POA: Diagnosis not present

## 2020-07-29 DIAGNOSIS — E876 Hypokalemia: Secondary | ICD-10-CM | POA: Diagnosis not present

## 2020-07-29 DIAGNOSIS — S299XXA Unspecified injury of thorax, initial encounter: Secondary | ICD-10-CM | POA: Diagnosis not present

## 2020-07-29 DIAGNOSIS — R4020 Unspecified coma: Secondary | ICD-10-CM | POA: Diagnosis not present

## 2020-07-29 DIAGNOSIS — S0990XA Unspecified injury of head, initial encounter: Secondary | ICD-10-CM | POA: Diagnosis not present

## 2020-07-29 DIAGNOSIS — M25512 Pain in left shoulder: Secondary | ICD-10-CM | POA: Diagnosis not present

## 2020-07-29 DIAGNOSIS — Y998 Other external cause status: Secondary | ICD-10-CM | POA: Diagnosis not present

## 2020-07-29 DIAGNOSIS — R1011 Right upper quadrant pain: Secondary | ICD-10-CM | POA: Diagnosis not present

## 2020-07-29 DIAGNOSIS — R0902 Hypoxemia: Secondary | ICD-10-CM | POA: Diagnosis not present

## 2020-07-29 DIAGNOSIS — Z23 Encounter for immunization: Secondary | ICD-10-CM | POA: Diagnosis not present

## 2020-07-29 DIAGNOSIS — S0181XA Laceration without foreign body of other part of head, initial encounter: Secondary | ICD-10-CM | POA: Diagnosis not present

## 2020-07-29 DIAGNOSIS — N179 Acute kidney failure, unspecified: Secondary | ICD-10-CM | POA: Diagnosis not present

## 2020-07-29 DIAGNOSIS — D509 Iron deficiency anemia, unspecified: Secondary | ICD-10-CM | POA: Diagnosis not present

## 2020-07-29 DIAGNOSIS — N390 Urinary tract infection, site not specified: Secondary | ICD-10-CM | POA: Diagnosis not present

## 2020-07-29 DIAGNOSIS — R9431 Abnormal electrocardiogram [ECG] [EKG]: Secondary | ICD-10-CM | POA: Diagnosis not present

## 2020-07-29 DIAGNOSIS — E860 Dehydration: Secondary | ICD-10-CM | POA: Diagnosis not present

## 2020-07-29 DIAGNOSIS — S01111A Laceration without foreign body of right eyelid and periocular area, initial encounter: Secondary | ICD-10-CM | POA: Diagnosis not present

## 2020-07-29 DIAGNOSIS — M25511 Pain in right shoulder: Secondary | ICD-10-CM | POA: Diagnosis not present

## 2020-07-29 DIAGNOSIS — W010XXA Fall on same level from slipping, tripping and stumbling without subsequent striking against object, initial encounter: Secondary | ICD-10-CM | POA: Diagnosis not present

## 2020-07-29 DIAGNOSIS — Z20822 Contact with and (suspected) exposure to covid-19: Secondary | ICD-10-CM | POA: Diagnosis not present

## 2020-07-29 DIAGNOSIS — W19XXXA Unspecified fall, initial encounter: Secondary | ICD-10-CM | POA: Diagnosis not present

## 2020-07-29 DIAGNOSIS — R569 Unspecified convulsions: Secondary | ICD-10-CM | POA: Diagnosis not present

## 2020-07-29 DIAGNOSIS — S199XXA Unspecified injury of neck, initial encounter: Secondary | ICD-10-CM | POA: Diagnosis not present

## 2020-07-29 DIAGNOSIS — Z043 Encounter for examination and observation following other accident: Secondary | ICD-10-CM | POA: Diagnosis not present

## 2020-07-29 DIAGNOSIS — S4991XA Unspecified injury of right shoulder and upper arm, initial encounter: Secondary | ICD-10-CM | POA: Diagnosis not present

## 2020-07-29 DIAGNOSIS — S3991XA Unspecified injury of abdomen, initial encounter: Secondary | ICD-10-CM | POA: Diagnosis not present

## 2020-07-30 DIAGNOSIS — E876 Hypokalemia: Secondary | ICD-10-CM | POA: Diagnosis not present

## 2020-07-30 DIAGNOSIS — R55 Syncope and collapse: Secondary | ICD-10-CM | POA: Diagnosis not present

## 2020-07-30 DIAGNOSIS — N390 Urinary tract infection, site not specified: Secondary | ICD-10-CM | POA: Diagnosis not present

## 2020-07-30 DIAGNOSIS — S0181XA Laceration without foreign body of other part of head, initial encounter: Secondary | ICD-10-CM | POA: Diagnosis not present

## 2020-07-31 DIAGNOSIS — F32A Depression, unspecified: Secondary | ICD-10-CM | POA: Diagnosis not present

## 2020-07-31 DIAGNOSIS — N390 Urinary tract infection, site not specified: Secondary | ICD-10-CM | POA: Diagnosis not present

## 2020-07-31 DIAGNOSIS — S0181XA Laceration without foreign body of other part of head, initial encounter: Secondary | ICD-10-CM | POA: Diagnosis not present

## 2020-07-31 DIAGNOSIS — D509 Iron deficiency anemia, unspecified: Secondary | ICD-10-CM | POA: Diagnosis not present

## 2020-08-01 DIAGNOSIS — R21 Rash and other nonspecific skin eruption: Secondary | ICD-10-CM | POA: Diagnosis not present

## 2020-08-01 DIAGNOSIS — D509 Iron deficiency anemia, unspecified: Secondary | ICD-10-CM | POA: Diagnosis not present

## 2020-08-01 DIAGNOSIS — M2550 Pain in unspecified joint: Secondary | ICD-10-CM | POA: Diagnosis not present

## 2020-08-01 DIAGNOSIS — N179 Acute kidney failure, unspecified: Secondary | ICD-10-CM | POA: Diagnosis not present

## 2020-08-01 DIAGNOSIS — S0181XA Laceration without foreign body of other part of head, initial encounter: Secondary | ICD-10-CM | POA: Diagnosis not present

## 2020-08-01 DIAGNOSIS — Z7401 Bed confinement status: Secondary | ICD-10-CM | POA: Diagnosis not present

## 2020-08-01 DIAGNOSIS — Z23 Encounter for immunization: Secondary | ICD-10-CM | POA: Diagnosis not present

## 2020-08-01 DIAGNOSIS — R55 Syncope and collapse: Secondary | ICD-10-CM | POA: Diagnosis not present

## 2020-08-01 DIAGNOSIS — I951 Orthostatic hypotension: Secondary | ICD-10-CM | POA: Diagnosis not present

## 2020-08-01 DIAGNOSIS — E876 Hypokalemia: Secondary | ICD-10-CM | POA: Diagnosis not present

## 2020-08-01 DIAGNOSIS — J42 Unspecified chronic bronchitis: Secondary | ICD-10-CM | POA: Diagnosis not present

## 2020-08-01 DIAGNOSIS — E039 Hypothyroidism, unspecified: Secondary | ICD-10-CM | POA: Diagnosis not present

## 2020-08-01 DIAGNOSIS — W19XXXD Unspecified fall, subsequent encounter: Secondary | ICD-10-CM | POA: Diagnosis not present

## 2020-08-01 DIAGNOSIS — N390 Urinary tract infection, site not specified: Secondary | ICD-10-CM | POA: Diagnosis not present

## 2020-08-01 DIAGNOSIS — K219 Gastro-esophageal reflux disease without esophagitis: Secondary | ICD-10-CM | POA: Diagnosis not present

## 2020-08-01 DIAGNOSIS — F29 Unspecified psychosis not due to a substance or known physiological condition: Secondary | ICD-10-CM | POA: Diagnosis not present

## 2020-08-01 DIAGNOSIS — E669 Obesity, unspecified: Secondary | ICD-10-CM | POA: Diagnosis not present

## 2020-08-01 DIAGNOSIS — R339 Retention of urine, unspecified: Secondary | ICD-10-CM | POA: Diagnosis not present

## 2020-08-01 DIAGNOSIS — Z20822 Contact with and (suspected) exposure to covid-19: Secondary | ICD-10-CM | POA: Diagnosis not present

## 2020-08-01 DIAGNOSIS — J449 Chronic obstructive pulmonary disease, unspecified: Secondary | ICD-10-CM | POA: Diagnosis not present

## 2020-08-01 DIAGNOSIS — D500 Iron deficiency anemia secondary to blood loss (chronic): Secondary | ICD-10-CM | POA: Diagnosis not present

## 2020-08-01 DIAGNOSIS — R404 Transient alteration of awareness: Secondary | ICD-10-CM | POA: Diagnosis not present

## 2020-08-01 DIAGNOSIS — S0181XD Laceration without foreign body of other part of head, subsequent encounter: Secondary | ICD-10-CM | POA: Diagnosis not present

## 2020-08-01 DIAGNOSIS — J411 Mucopurulent chronic bronchitis: Secondary | ICD-10-CM | POA: Diagnosis not present

## 2020-08-01 DIAGNOSIS — E860 Dehydration: Secondary | ICD-10-CM | POA: Diagnosis not present

## 2020-08-01 DIAGNOSIS — G4730 Sleep apnea, unspecified: Secondary | ICD-10-CM | POA: Diagnosis not present

## 2020-08-01 DIAGNOSIS — R5381 Other malaise: Secondary | ICD-10-CM | POA: Diagnosis not present

## 2020-08-01 DIAGNOSIS — G9341 Metabolic encephalopathy: Secondary | ICD-10-CM | POA: Diagnosis not present

## 2020-08-01 DIAGNOSIS — R1011 Right upper quadrant pain: Secondary | ICD-10-CM | POA: Diagnosis not present

## 2020-08-04 DIAGNOSIS — R55 Syncope and collapse: Secondary | ICD-10-CM | POA: Diagnosis not present

## 2020-08-05 DIAGNOSIS — J411 Mucopurulent chronic bronchitis: Secondary | ICD-10-CM | POA: Diagnosis not present

## 2020-08-05 DIAGNOSIS — E876 Hypokalemia: Secondary | ICD-10-CM | POA: Diagnosis not present

## 2020-08-05 DIAGNOSIS — N390 Urinary tract infection, site not specified: Secondary | ICD-10-CM | POA: Diagnosis not present

## 2020-08-05 DIAGNOSIS — R5381 Other malaise: Secondary | ICD-10-CM | POA: Diagnosis not present

## 2020-08-05 DIAGNOSIS — N179 Acute kidney failure, unspecified: Secondary | ICD-10-CM | POA: Diagnosis not present

## 2020-08-05 DIAGNOSIS — D509 Iron deficiency anemia, unspecified: Secondary | ICD-10-CM | POA: Diagnosis not present

## 2020-08-07 DIAGNOSIS — N390 Urinary tract infection, site not specified: Secondary | ICD-10-CM | POA: Diagnosis not present

## 2020-08-07 DIAGNOSIS — R5381 Other malaise: Secondary | ICD-10-CM | POA: Diagnosis not present

## 2020-08-07 DIAGNOSIS — R339 Retention of urine, unspecified: Secondary | ICD-10-CM | POA: Diagnosis not present

## 2020-08-08 DIAGNOSIS — N390 Urinary tract infection, site not specified: Secondary | ICD-10-CM | POA: Diagnosis not present

## 2020-08-15 DIAGNOSIS — R21 Rash and other nonspecific skin eruption: Secondary | ICD-10-CM | POA: Diagnosis not present

## 2020-08-15 DIAGNOSIS — D509 Iron deficiency anemia, unspecified: Secondary | ICD-10-CM | POA: Diagnosis not present

## 2020-08-20 DIAGNOSIS — Z23 Encounter for immunization: Secondary | ICD-10-CM | POA: Diagnosis not present

## 2020-08-21 ENCOUNTER — Telehealth: Payer: Self-pay

## 2020-08-21 NOTE — Telephone Encounter (Signed)
Recd a call from [facility] with [facility] at [phone], pt is uin their care as she has been in and out of the hosp, appt requested as she still has a port etc.  Tasha Clarke could not advise if she has been anywhere else to have it flushed     Rachid Parham

## 2020-08-22 DIAGNOSIS — R600 Localized edema: Secondary | ICD-10-CM | POA: Diagnosis not present

## 2020-08-22 DIAGNOSIS — R1084 Generalized abdominal pain: Secondary | ICD-10-CM | POA: Diagnosis not present

## 2020-08-22 DIAGNOSIS — T402X5A Adverse effect of other opioids, initial encounter: Secondary | ICD-10-CM | POA: Diagnosis not present

## 2020-08-22 DIAGNOSIS — K648 Other hemorrhoids: Secondary | ICD-10-CM | POA: Diagnosis not present

## 2020-08-22 DIAGNOSIS — K5903 Drug induced constipation: Secondary | ICD-10-CM | POA: Diagnosis not present

## 2020-08-22 DIAGNOSIS — R339 Retention of urine, unspecified: Secondary | ICD-10-CM | POA: Diagnosis not present

## 2020-08-22 DIAGNOSIS — R109 Unspecified abdominal pain: Secondary | ICD-10-CM | POA: Diagnosis not present

## 2020-08-22 DIAGNOSIS — K625 Hemorrhage of anus and rectum: Secondary | ICD-10-CM | POA: Diagnosis not present

## 2020-08-22 DIAGNOSIS — S0181XD Laceration without foreign body of other part of head, subsequent encounter: Secondary | ICD-10-CM | POA: Diagnosis not present

## 2020-08-25 ENCOUNTER — Ambulatory Visit: Payer: Medicare HMO | Admitting: Family Medicine

## 2020-08-25 ENCOUNTER — Other Ambulatory Visit: Payer: Medicare HMO

## 2020-08-26 ENCOUNTER — Other Ambulatory Visit: Payer: Self-pay

## 2020-08-26 DIAGNOSIS — R600 Localized edema: Secondary | ICD-10-CM | POA: Diagnosis not present

## 2020-08-26 DIAGNOSIS — N390 Urinary tract infection, site not specified: Secondary | ICD-10-CM | POA: Diagnosis not present

## 2020-08-26 DIAGNOSIS — R9431 Abnormal electrocardiogram [ECG] [EKG]: Secondary | ICD-10-CM | POA: Diagnosis not present

## 2020-08-26 DIAGNOSIS — K625 Hemorrhage of anus and rectum: Secondary | ICD-10-CM | POA: Diagnosis not present

## 2020-08-26 DIAGNOSIS — K5903 Drug induced constipation: Secondary | ICD-10-CM | POA: Diagnosis not present

## 2020-08-26 NOTE — Patient Outreach (Signed)
[facility] [facility]) Care Management  08/26/2020  Tasha Clarke July 12, 1951 [id number]     Transition of Care Referral  Referral Date: 08/26/2020 Referral Source: [facility] Discharge Report Date of Discharge: 08/23/2020 Facility: [place]: [facility]    Referral received. Upon chart review and Patient Ping platform patient remains inpatient at facility.    Plan: RN CM will close referral at this time.    Enzo Montgomery, RN,BSN,CCM [place] Management Telephonic Care Management Coordinator Direct Phone: [phone] Toll Free: [phone] Fax: [phone]

## 2020-08-27 ENCOUNTER — Encounter: Payer: Self-pay | Admitting: Family Medicine

## 2020-08-27 DIAGNOSIS — R600 Localized edema: Secondary | ICD-10-CM | POA: Diagnosis not present

## 2020-08-29 ENCOUNTER — Other Ambulatory Visit: Payer: Self-pay | Admitting: *Deleted

## 2020-08-29 DIAGNOSIS — D631 Anemia in chronic kidney disease: Secondary | ICD-10-CM

## 2020-09-01 ENCOUNTER — Telehealth: Payer: Self-pay

## 2020-09-01 ENCOUNTER — Inpatient Hospital Stay: Payer: Medicare HMO

## 2020-09-01 ENCOUNTER — Other Ambulatory Visit: Payer: Self-pay

## 2020-09-01 ENCOUNTER — Inpatient Hospital Stay: Payer: Medicare HMO | Attending: Hematology & Oncology

## 2020-09-01 ENCOUNTER — Encounter: Payer: Self-pay | Admitting: Family

## 2020-09-01 ENCOUNTER — Inpatient Hospital Stay (HOSPITAL_BASED_OUTPATIENT_CLINIC_OR_DEPARTMENT_OTHER): Payer: Medicare HMO | Admitting: Family

## 2020-09-01 VITALS — BP 138/61 | HR 72 | Temp 98.0°F | Resp 17 | Ht 65.0 in | Wt 216.0 lb

## 2020-09-01 DIAGNOSIS — D631 Anemia in chronic kidney disease: Secondary | ICD-10-CM

## 2020-09-01 DIAGNOSIS — N189 Chronic kidney disease, unspecified: Secondary | ICD-10-CM

## 2020-09-01 DIAGNOSIS — D509 Iron deficiency anemia, unspecified: Secondary | ICD-10-CM | POA: Insufficient documentation

## 2020-09-01 DIAGNOSIS — E785 Hyperlipidemia, unspecified: Secondary | ICD-10-CM | POA: Diagnosis not present

## 2020-09-01 DIAGNOSIS — D508 Other iron deficiency anemias: Secondary | ICD-10-CM

## 2020-09-01 DIAGNOSIS — D500 Iron deficiency anemia secondary to blood loss (chronic): Secondary | ICD-10-CM | POA: Diagnosis not present

## 2020-09-01 LAB — CMP (CANCER CENTER ONLY)
ALT: 10 U/L (ref 0–44)
AST: 20 U/L (ref 15–41)
Albumin: 3.4 g/dL — ABNORMAL LOW (ref 3.5–5.0)
Alkaline Phosphatase: 75 U/L (ref 38–126)
Anion gap: 7 (ref 5–15)
BUN: 26 mg/dL — ABNORMAL HIGH (ref 8–23)
CO2: 33 mmol/L — ABNORMAL HIGH (ref 22–32)
Calcium: 9.6 mg/dL (ref 8.9–10.3)
Chloride: 99 mmol/L (ref 98–111)
Creatinine: 1.1 mg/dL — ABNORMAL HIGH (ref 0.44–1.00)
GFR, Estimated: 55 mL/min — ABNORMAL LOW (ref >60)
Glucose, Bld: 108 mg/dL — ABNORMAL HIGH (ref 70–99)
Potassium: 3.9 mmol/L (ref 3.5–5.1)
Sodium: 139 mmol/L (ref 135–145)
Total Bilirubin: 0.2 mg/dL — ABNORMAL LOW (ref 0.3–1.2)
Total Protein: 6.3 g/dL — ABNORMAL LOW (ref 6.5–8.1)

## 2020-09-01 LAB — CBC WITH DIFFERENTIAL (CANCER CENTER ONLY)
Abs Immature Granulocytes: 0.05 10*3/uL (ref 0.00–0.07)
Basophils Absolute: 0 10*3/uL (ref 0.0–0.1)
Basophils Relative: 1 %
Eosinophils Absolute: 0.4 10*3/uL (ref 0.0–0.5)
Eosinophils Relative: 6 %
HCT: 28 % — ABNORMAL LOW (ref 36.0–46.0)
Hemoglobin: 7.9 g/dL — ABNORMAL LOW (ref 12.0–15.0)
Immature Granulocytes: 1 %
Lymphocytes Relative: 18 %
Lymphs Abs: 1.3 10*3/uL (ref 0.7–4.0)
MCH: 24.8 pg — ABNORMAL LOW (ref 26.0–34.0)
MCHC: 28.2 g/dL — ABNORMAL LOW (ref 30.0–36.0)
MCV: 88.1 fL (ref 80.0–100.0)
Monocytes Absolute: 0.9 10*3/uL (ref 0.1–1.0)
Monocytes Relative: 12 %
Neutro Abs: 4.5 10*3/uL (ref 1.7–7.7)
Neutrophils Relative %: 62 %
Platelet Count: 265 10*3/uL (ref 150–400)
RBC: 3.18 MIL/uL — ABNORMAL LOW (ref 3.87–5.11)
WBC Count: 7.1 10*3/uL (ref 4.0–10.5)
nRBC: 0 % (ref 0.0–0.2)

## 2020-09-01 LAB — RETICULOCYTES
Immature Retic Fract: 33.9 % — ABNORMAL HIGH (ref 2.3–15.9)
RBC.: 3.17 MIL/uL — ABNORMAL LOW (ref 3.87–5.11)
Retic Count, Absolute: 154.4 10*3/uL (ref 19.0–186.0)
Retic Ct Pct: 4.9 % — ABNORMAL HIGH (ref 0.4–3.1)

## 2020-09-01 MED ORDER — SODIUM CHLORIDE 0.9 % IV SOLN
200.0000 mg | Freq: Once | INTRAVENOUS | Status: AC
  Administered 2020-09-01: 200 mg via INTRAVENOUS
  Filled 2020-09-01: qty 200

## 2020-09-01 MED ORDER — SODIUM CHLORIDE 0.9% FLUSH
10.0000 mL | Freq: Once | INTRAVENOUS | Status: AC | PRN
  Administered 2020-09-01: 10 mL
  Filled 2020-09-01: qty 10

## 2020-09-01 MED ORDER — SODIUM CHLORIDE 0.9 % IV SOLN
Freq: Once | INTRAVENOUS | Status: AC
  Filled 2020-09-01: qty 250

## 2020-09-01 MED ORDER — HEPARIN SOD (PORK) LOCK FLUSH 100 UNIT/ML IV SOLN
500.0000 [IU] | Freq: Once | INTRAVENOUS | Status: AC | PRN
  Administered 2020-09-01: 500 [IU]
  Filled 2020-09-01: qty 5

## 2020-09-01 NOTE — Telephone Encounter (Signed)
Called and left a vm with all appts per 09/01/20 los and pt may view them on mychart as well     Tasha Clarke

## 2020-09-01 NOTE — Patient Instructions (Signed)

## 2020-09-01 NOTE — Progress Notes (Signed)
Hematology and Oncology Follow Up Visit  Tasha Clarke [id number] 1950-08-15 70 y.o. 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 We last saw her in January 2021. Hgb is 7.9, MCV 88, WBC count 7.1 and platelets 265.   She had a fall in January and states that she had a large laceration over the right eye requiring stitches. She is now living in a local assisted living facility here in [facility].  Unfortunately, her beloved husband passed away last year in 10/25/22 and she is still struggling with losing him. Thankfully their children have been present and are a good support system for her.  She states that she is now noting blood in her stool again. Now that she is in [place] she needs a local GI doctor. We will get her over to see Dr. [name] Lemma with [facility] for further work up and treatment. She does have history of bleeding gastric ulcers.  No other blood loss noted. No petechiae. She is currently on Protonix daily.  She states that she wears 3L [place] supplemental O2 24 hours a day. She will take off when she goes out to smoke. She states that she is smoking 1/2 ppd. She notes chronic SOB with exertion.  No fever, n/v, cough, rash, dizziness, abdominal pain or changes in bowel or bladder habits.  She has a urinary catheter in place and is unsure if this will eventually be discontinued or permanent.  She states that she has occasional episodes of chest pain and takes nitroglycerin when needed which she feels has been effective.  She has palpitations which she associates with stress.  She is not ambulatory at this time.  She has swelling in both lower extremities. She has a venous stasis  ulcer in the left out calf which is dry and intact at this time.   Pedal pulses are 1+.  She notes chills and that her fingertips stay cold.   ECOG Performance Status: 3 - Symptomatic, >50% confined to bed  Medications:  Allergies as of 09/01/2020      Reactions   Iodinated Diagnostic Agents Anaphylaxis   Coded   Cephalexin Hives   Imdur [isosorbide Dinitrate] Nausea And Vomiting   Ketorolac Tromethamine Hives   Lac Bovis Nausea And Vomiting   Other Other (See Comments)   Alka seltzer causes blurred vision, fainting   Nickel Itching   redness   Milk-related Compounds Nausea And Vomiting   Sodium Bicarbonate-citric Acid Nausea And Vomiting      Medication List       Accurate as of September 01, 2020 10:16 AM. If you have any questions, ask your nurse or doctor.        STOP taking these medications   predniSONE 20 MG tablet Commonly known as: DELTASONE Stopped by: Laverna Peace, NP     TAKE these medications   albuterol 108 (90 Base) MCG/ACT inhaler Commonly known as: VENTOLIN HFA Inhale 1-2 puffs into the lungs every 4 (four) hours as needed for wheezing or shortness of breath.   alendronate 70 MG tablet Commonly known as: FOSAMAX Take 1 tablet (70 mg total) by mouth every Monday. Take with a full glass of water on an empty stomach.   buprenorphine 20 MCG/HR  Ptwk Commonly known as: BUTRANS Place 1 patch onto the skin once a week. Sunday   cetirizine 10 MG tablet Commonly known as: EQ Allergy Relief (Cetirizine) Take 1 tablet (10 mg total) by mouth daily.   cyclobenzaprine 10 MG tablet Commonly known as: FLEXERIL TAKE 1 TABLET BY MOUTH AT BEDTIME   dicyclomine 10 MG capsule Commonly known as: BENTYL Take 1 capsule (10 mg total) by mouth 4 (four) times daily as needed (for diarrhea).   diphenhydramine-acetaminophen 25-500 MG Tabs tablet Commonly known as: TYLENOL PM Take 2 tablets by mouth at bedtime.   DULoxetine 60 MG capsule Commonly known as: CYMBALTA Take 2 capsules by mouth once daily   EPINEPHrine 0.3 mg/0.3  mL Soaj injection Commonly known as: EPI-PEN Inject 0.3 mLs (0.3 mg total) into the muscle as needed.   Fish Oil 1000 MG Caps Take 1,000 mg by mouth daily.   fluticasone 50 MCG/ACT nasal spray Commonly known as: Flonase Place 2 sprays into both nostrils daily.   Fluticasone-Umeclidin-Vilant 100-62.5-25 MCG/INH Aepb Inhale 1 puff into the lungs 1 day or 1 dose.   folic acid 1 MG tablet Commonly known as: FOLVITE Take 1 tablet (1 mg total) by mouth daily.   furosemide 20 MG tablet Commonly known as: LASIX Take 1 tablet by mouth daily.   gabapentin 600 MG tablet Commonly known as: NEURONTIN Take 1 tablet (600 mg total) by mouth in the morning, at noon, in the evening, and at bedtime.   hydrOXYzine 25 MG tablet Commonly known as: ATARAX/VISTARIL Take 1 tablet (25 mg total) by mouth 3 (three) times daily as needed.   ipratropium-albuterol 0.5-2.5 (3) MG/3ML Soln Commonly known as: DUONEB Take 3 mLs by nebulization every 6 (six) hours as needed. (Needs to be seen before next refill)   Iron 325 (65 Fe) MG Tabs Take 1 tablet (325 mg total) by mouth daily.   levETIRAcetam 1000 MG tablet Commonly known as: KEPPRA Take by mouth.   levETIRAcetam 1000 MG tablet Commonly known as: KEPPRA Take 1 tablet (1,000 mg total) by mouth at bedtime.   levETIRAcetam 750 MG tablet Commonly known as: KEPPRA Take 1 tablet (750 mg total) by mouth every morning.   levothyroxine 50 MCG tablet Commonly known as: SYNTHROID Take 1 tablet (50 mcg total) by mouth daily before breakfast.   Magnesium 400 MG Caps Take 400 mg by mouth daily.   magnesium oxide 400 MG tablet Commonly known as: MAG-OX Take by mouth.   Melatonin 10 MG Tabs Take 10 mg by mouth at bedtime.   metoprolol tartrate 25 MG tablet Commonly known as: LOPRESSOR Take 12.5 mg by mouth 2 (two) times daily.   multivitamin tablet Take 1 tablet by mouth daily.   naloxone 4 MG/0.1ML Liqd nasal spray kit Commonly known as:  NARCAN Place 0.4 mg into the nose once.   ondansetron 4 MG tablet Commonly known as: ZOFRAN TAKE 1 TABLET BY MOUTH 4 TIMES DAILY AS NEEDED What changed:   how much to take  how to take this  when to take this  reasons to take this  additional instructions   pantoprazole 40 MG tablet Commonly known as: PROTONIX Take 1 tablet (40 mg total) by mouth 2 (two) times daily.   Percocet 10-325 MG tablet Generic drug: oxyCODONE-acetaminophen Take 1 tablet by mouth every 6 (six) hours.   potassium chloride 10 MEQ tablet Commonly known as: KLOR-CON Take 10 mEq by mouth daily.   QUEtiapine 200 MG tablet Commonly known as: SEROQUEL  TAKE 2 TABLETS BY MOUTH AT BEDTIME   rosuvastatin 40 MG tablet Commonly known as: CRESTOR Take 1 tablet (40 mg total) by mouth daily.   tiZANidine 4 MG tablet Commonly known as: ZANAFLEX Take 4 mg by mouth 3 (three) times daily.   VITAMIN B-12 PO Take 1 tablet by mouth daily.   cyanocobalamin 100 MCG tablet Take 100 mcg by mouth daily.   Vitamin D-3 125 MCG (5000 UT) Tabs Take 5,000 Units by mouth daily.       Allergies:  Allergies  Allergen Reactions  . Iodinated Diagnostic Agents Anaphylaxis    Coded  . Cephalexin Hives  . Imdur [Isosorbide Dinitrate] Nausea And Vomiting  . Ketorolac Tromethamine Hives  . Lac Bovis Nausea And Vomiting  . Other Other (See Comments)    Alka seltzer causes blurred vision, fainting  . Nickel Itching    redness  . Milk-Related Compounds Nausea And Vomiting  . Sodium Bicarbonate-Citric Acid Nausea And Vomiting    Past Medical History, Surgical history, Social history, and Family History were reviewed and updated.  Review of Systems: All other 10 point review of systems is negative.   Physical Exam:  height is '5\' 5"'  (1.651 m) and weight is 216 lb (98 kg). Her oral temperature is 98 F (36.7 C). Her blood pressure is 138/61 and her pulse is 72. Her respiration is 17 and oxygen saturation is 98%.    Wt Readings from Last 3 Encounters:  09/01/20 216 lb (98 kg)  04/23/20 210 lb (95.3 kg)  01/18/20 188 lb (85.3 kg)    Ocular: Sclerae unicteric, pupils equal, round and reactive to light Ear-nose-throat: Oropharynx clear, dentition fair Lymphatic: No cervical or supraclavicular adenopathy Lungs no rales or rhonchi, good excursion bilaterally Heart regular rate and rhythm, no murmur appreciated Abd soft, nontender, positive bowel sounds MSK no focal spinal tenderness, no joint edema Neuro: non-focal, well-oriented, appropriate affect Breasts: Deferred   Lab Results  Component Value Date   WBC 7.1 09/01/2020   HGB 7.9 (L) 09/01/2020   HCT 28.0 (L) 09/01/2020   MCV 88.1 09/01/2020   PLT 265 09/01/2020   Lab Results  Component Value Date   FERRITIN 107 07/17/2019   IRON 19 (L) 07/17/2019   TIBC 196 (L) 07/17/2019   UIBC 177 07/17/2019   IRONPCTSAT 10 (L) 07/17/2019   Lab Results  Component Value Date   RETICCTPCT 4.9 (H) 09/01/2020   RBC 3.17 (L) 09/01/2020   RBC 3.18 (L) 09/01/2020   Lab Results  Component Value Date   KPAFRELGTCHN 35.9 (H) 11/16/2017   LAMBDASER 39.8 (H) 11/16/2017   KAPLAMBRATIO 0.90 11/16/2017   No results found for: Kandis Cocking, IGMSERUM Lab Results  Component Value Date   TOTALPROTELP 6.6 11/16/2017   ALBUMINELP 3.3 11/16/2017   A1GS 0.3 11/16/2017   A2GS 0.8 11/16/2017   BETS 1.1 11/16/2017   GAMS 1.1 11/16/2017   MSPIKE Not Observed 11/16/2017   SPEI Comment 11/16/2017     Chemistry      Component Value Date/Time   NA 141 04/23/2020 1627   K 4.9 04/23/2020 1627   CL 105 04/23/2020 1627   CO2 24 04/23/2020 1627   BUN 19 04/23/2020 1627   CREATININE 0.93 04/23/2020 1627   CREATININE 1.38 (H) 08/07/2019 1422      Component Value Date/Time   CALCIUM 8.6 (L) 04/23/2020 1627   ALKPHOS 65 04/23/2020 1627   AST 20 04/23/2020 1627   AST 23 08/07/2019 1422   ALT  13 04/23/2020 1627   ALT 13 08/07/2019 1422   BILITOT <0.2  04/23/2020 1627   BILITOT 0.2 (L) 08/07/2019 1422       Impression and Plan: Tasha Clarke is a very pleasant 70 yo caucasian female withmultifactorial anemia.  She notes GI blood loss so we have placed urgent GI referral.  She will get 5 doses of IV iron starting today.  Follow-up in 4 weeks.  She was encouraged to contact our office with any questions or concerns.   Laverna Peace, NP 2/21/202210:16 AM

## 2020-09-01 NOTE — Patient Instructions (Signed)

## 2020-09-02 ENCOUNTER — Other Ambulatory Visit: Payer: Self-pay | Admitting: Family

## 2020-09-02 DIAGNOSIS — R600 Localized edema: Secondary | ICD-10-CM | POA: Diagnosis not present

## 2020-09-02 DIAGNOSIS — F4323 Adjustment disorder with mixed anxiety and depressed mood: Secondary | ICD-10-CM | POA: Diagnosis not present

## 2020-09-02 DIAGNOSIS — G4700 Insomnia, unspecified: Secondary | ICD-10-CM | POA: Diagnosis not present

## 2020-09-02 LAB — IRON AND TIBC
Iron: 15 ug/dL — ABNORMAL LOW (ref 41–142)
Saturation Ratios: 4 % — ABNORMAL LOW (ref 21–57)
TIBC: 370 ug/dL (ref 236–444)
UIBC: 355 ug/dL (ref 120–384)

## 2020-09-02 LAB — FERRITIN: Ferritin: 32 ng/mL (ref 11–307)

## 2020-09-04 ENCOUNTER — Inpatient Hospital Stay: Payer: Medicare HMO

## 2020-09-08 ENCOUNTER — Other Ambulatory Visit: Payer: Self-pay

## 2020-09-08 ENCOUNTER — Inpatient Hospital Stay: Payer: Medicare HMO

## 2020-09-08 VITALS — BP 100/55 | HR 75 | Temp 99.5°F | Resp 19

## 2020-09-08 DIAGNOSIS — D508 Other iron deficiency anemias: Secondary | ICD-10-CM

## 2020-09-08 DIAGNOSIS — R339 Retention of urine, unspecified: Secondary | ICD-10-CM | POA: Diagnosis not present

## 2020-09-08 DIAGNOSIS — I951 Orthostatic hypotension: Secondary | ICD-10-CM | POA: Diagnosis not present

## 2020-09-08 DIAGNOSIS — S0181XD Laceration without foreign body of other part of head, subsequent encounter: Secondary | ICD-10-CM | POA: Diagnosis not present

## 2020-09-08 DIAGNOSIS — J9612 Chronic respiratory failure with hypercapnia: Secondary | ICD-10-CM | POA: Diagnosis not present

## 2020-09-08 DIAGNOSIS — D509 Iron deficiency anemia, unspecified: Secondary | ICD-10-CM | POA: Diagnosis not present

## 2020-09-08 DIAGNOSIS — D500 Iron deficiency anemia secondary to blood loss (chronic): Secondary | ICD-10-CM | POA: Diagnosis not present

## 2020-09-08 DIAGNOSIS — D631 Anemia in chronic kidney disease: Secondary | ICD-10-CM

## 2020-09-08 DIAGNOSIS — N189 Chronic kidney disease, unspecified: Secondary | ICD-10-CM | POA: Diagnosis not present

## 2020-09-08 DIAGNOSIS — J411 Mucopurulent chronic bronchitis: Secondary | ICD-10-CM | POA: Diagnosis not present

## 2020-09-08 DIAGNOSIS — N179 Acute kidney failure, unspecified: Secondary | ICD-10-CM | POA: Diagnosis not present

## 2020-09-08 DIAGNOSIS — J9611 Chronic respiratory failure with hypoxia: Secondary | ICD-10-CM | POA: Diagnosis not present

## 2020-09-08 MED ORDER — SODIUM CHLORIDE 0.9% FLUSH
10.0000 mL | Freq: Once | INTRAVENOUS | Status: AC | PRN
  Administered 2020-09-08: 10 mL
  Filled 2020-09-08: qty 10

## 2020-09-08 MED ORDER — SODIUM CHLORIDE 0.9 % IV SOLN
200.0000 mg | Freq: Once | INTRAVENOUS | Status: AC
  Administered 2020-09-08: 200 mg via INTRAVENOUS
  Filled 2020-09-08: qty 200

## 2020-09-08 MED ORDER — SODIUM CHLORIDE 0.9 % IV SOLN
Freq: Once | INTRAVENOUS | Status: AC
  Filled 2020-09-08: qty 250

## 2020-09-08 MED ORDER — HEPARIN SOD (PORK) LOCK FLUSH 100 UNIT/ML IV SOLN
500.0000 [IU] | Freq: Once | INTRAVENOUS | Status: AC | PRN
  Administered 2020-09-08: 500 [IU]
  Filled 2020-09-08: qty 5

## 2020-09-08 NOTE — Patient Instructions (Signed)

## 2020-09-09 DIAGNOSIS — M5416 Radiculopathy, lumbar region: Secondary | ICD-10-CM | POA: Diagnosis not present

## 2020-09-09 DIAGNOSIS — Z79899 Other long term (current) drug therapy: Secondary | ICD-10-CM | POA: Diagnosis not present

## 2020-09-09 DIAGNOSIS — M62830 Muscle spasm of back: Secondary | ICD-10-CM | POA: Diagnosis not present

## 2020-09-09 DIAGNOSIS — F1120 Opioid dependence, uncomplicated: Secondary | ICD-10-CM | POA: Diagnosis not present

## 2020-09-09 DIAGNOSIS — G8929 Other chronic pain: Secondary | ICD-10-CM | POA: Diagnosis not present

## 2020-09-10 ENCOUNTER — Other Ambulatory Visit: Payer: Self-pay

## 2020-09-10 NOTE — Patient Outreach (Addendum)
[facility] [facility]) Care Management  09/10/2020  [place] 1951-02-01 [id number]     Transition of Care Referral  Referral Date: 09/10/2020 Referral Source: [facility] Discharge Report Date of Discharge: 09/08/2020 Facility: [place]: [facility]    Outreach attempt # 1 to patient. No answer after several rings.     Plan: RN CM will make outreach attempt to patient within 3-4 business days.  RN CM will send unsuccessful outreach letter to patient.    Enzo Montgomery, RN,BSN,CCM [place] Management Telephonic Care Management Coordinator Direct Phone: [phone] Toll Free: [phone] Fax: [phone]

## 2020-09-11 ENCOUNTER — Telehealth: Payer: Self-pay

## 2020-09-11 ENCOUNTER — Other Ambulatory Visit: Payer: Self-pay

## 2020-09-11 ENCOUNTER — Inpatient Hospital Stay: Payer: Medicare HMO

## 2020-09-11 NOTE — Telephone Encounter (Signed)
[facility] at [phone] called regarding missing todays appts,  todays appt ahs been r/s to 3/4 and all other appts have been verified,  All appts will also be printed for pt and given to her for the driver 02/15/27   Sharie Amorin

## 2020-09-11 NOTE — Patient Outreach (Signed)
[place] [facility]) Care Management  09/11/2020  [place] [birth year] [id number]   Transition of Care Referral  Referral Date: 09/10/2020 Referral Source: [facility] Discharge Report Date of Discharge: 09/08/2020 Facility: [place] [facility]   Unsuccessful outreach attempt # 2 to patient.    Plan: RN CM will make outreach attempt to patient within 3-4 business days.   Enzo Montgomery, RN,BSN,CCM [place] Management Telephonic Care Management Coordinator Direct Phone: [phone] Toll Free: [phone] Fax: [phone]

## 2020-09-11 NOTE — Telephone Encounter (Signed)
[facility] called back from [phone] and stated that they found that the patient is no longer in their facility.  I have called the home number and the vm is full, other nu,ber listed [phone] is also full and I was given pts son number [phone] and not valid either.  Burman Nieves has been made aware of the phone attempts/potential no show for the 09/12/20 appt   Ruxin Ransome

## 2020-09-12 ENCOUNTER — Other Ambulatory Visit: Payer: Self-pay

## 2020-09-12 ENCOUNTER — Ambulatory Visit: Payer: Medicare HMO

## 2020-09-12 NOTE — Patient Outreach (Signed)
[facility]) Care Management  09/12/2020  [place]wood 30-Jul-1950 [id number]   Transition of Care Referral  Referral Date:09/10/2020 Referral Source:[employer] Discharge Report Date of Discharge:09/08/2020 Facility:[facility] Insurance:[employer] Medicare   Outreach attempt # 3 to patient. No answer at present and unable to leave message.     Plan: RN CM will make outreach attempt to patient within 3-4 business days.   Enzo Montgomery, RN,BSN,CCM [place] Management Telephonic Care Management Coordinator Direct Phone: [phone] Toll Free: [phone] Fax: [phone]

## 2020-09-15 ENCOUNTER — Inpatient Hospital Stay: Payer: Medicare HMO | Attending: Hematology & Oncology

## 2020-09-28 DIAGNOSIS — E876 Hypokalemia: Secondary | ICD-10-CM | POA: Diagnosis not present

## 2020-09-28 DIAGNOSIS — I700 Atherosclerosis of aorta: Secondary | ICD-10-CM | POA: Diagnosis not present

## 2020-09-28 DIAGNOSIS — N390 Urinary tract infection, site not specified: Secondary | ICD-10-CM | POA: Diagnosis not present

## 2020-09-28 DIAGNOSIS — J449 Chronic obstructive pulmonary disease, unspecified: Secondary | ICD-10-CM | POA: Diagnosis not present

## 2020-09-28 DIAGNOSIS — I2510 Atherosclerotic heart disease of native coronary artery without angina pectoris: Secondary | ICD-10-CM | POA: Diagnosis not present

## 2020-09-28 DIAGNOSIS — R197 Diarrhea, unspecified: Secondary | ICD-10-CM | POA: Diagnosis not present

## 2020-09-28 DIAGNOSIS — R1032 Left lower quadrant pain: Secondary | ICD-10-CM | POA: Diagnosis not present

## 2020-09-28 DIAGNOSIS — R109 Unspecified abdominal pain: Secondary | ICD-10-CM | POA: Diagnosis not present

## 2020-09-28 DIAGNOSIS — Z9049 Acquired absence of other specified parts of digestive tract: Secondary | ICD-10-CM | POA: Diagnosis not present

## 2020-09-28 DIAGNOSIS — Z9981 Dependence on supplemental oxygen: Secondary | ICD-10-CM | POA: Diagnosis not present

## 2020-09-28 DIAGNOSIS — Z90710 Acquired absence of both cervix and uterus: Secondary | ICD-10-CM | POA: Diagnosis not present

## 2020-09-28 DIAGNOSIS — Z978 Presence of other specified devices: Secondary | ICD-10-CM | POA: Diagnosis not present

## 2020-09-29 ENCOUNTER — Inpatient Hospital Stay: Payer: Medicare HMO

## 2020-09-29 ENCOUNTER — Inpatient Hospital Stay: Payer: Medicare HMO | Admitting: Family

## 2020-10-06 ENCOUNTER — Other Ambulatory Visit: Payer: Self-pay

## 2020-10-06 NOTE — Patient Outreach (Signed)
[facility]) Care Management  10/06/2020  Tasha Clarke 1950-09-21 [id number]    Transition of Care Referral  Referral Date:09/10/2020 Referral Source:[employer] Discharge Report Date of Discharge:09/08/2020 Facility:[facility] Insurance:[employer] Medicare    Multiple attempts to establish contact with patient without success. No response from letter mailed to patient. Case is being closed at this time.     Plan: RN CM will close case at this time.  Enzo Montgomery, RN,BSN,CCM [facility] Telephonic Care Management Coordinator Direct Phone: [phone] Toll Free: [phone] Fax: [phone]

## 2021-01-02 ENCOUNTER — Other Ambulatory Visit: Payer: Self-pay | Admitting: *Deleted

## 2021-01-02 ENCOUNTER — Telehealth: Payer: Self-pay | Admitting: *Deleted

## 2021-01-02 DIAGNOSIS — F339 Major depressive disorder, recurrent, unspecified: Secondary | ICD-10-CM

## 2021-01-02 MED ORDER — PANTOPRAZOLE SODIUM 40 MG PO TBEC: 40.0000 mg | DELAYED_RELEASE_TABLET | Freq: Two times a day (BID) | ORAL | 0 refills | Status: AC

## 2021-01-02 MED ORDER — FUROSEMIDE 20 MG PO TABS: 20.0000 mg | ORAL_TABLET | Freq: Every day | ORAL | 0 refills | Status: AC

## 2021-01-02 MED ORDER — FLUTICASONE PROPIONATE 50 MCG/ACT NA SUSP: 2.0000 | Freq: Every day | NASAL | 0 refills | Status: DC

## 2021-01-02 MED ORDER — DULOXETINE HCL 60 MG PO CPEP
120.0000 mg | ORAL_CAPSULE | Freq: Every day | ORAL | 0 refills | Status: AC
Start: 2021-01-02 — End: ?

## 2021-01-02 MED ORDER — LEVETIRACETAM 1000 MG PO TABS: 1000.0000 mg | ORAL_TABLET | Freq: Every day | ORAL | 0 refills | Status: AC

## 2021-01-02 MED ORDER — ALENDRONATE SODIUM 70 MG PO TABS: 70.0000 mg | ORAL_TABLET | ORAL | 0 refills | Status: AC

## 2021-01-02 MED ORDER — ROSUVASTATIN CALCIUM 40 MG PO TABS: 40.0000 mg | ORAL_TABLET | Freq: Every day | ORAL | 0 refills | Status: AC

## 2021-01-02 NOTE — Telephone Encounter (Signed)
Lattie Haw, Nurse Case Manager, [employer] states she contacted patient for post hospital discharge. Patient was admitted for UTI, Sepsis.   NCM states patient reports: 1 fall w/ dizziness. Blood in catheter Bloody dresssing at cath site Thoracic Pain back to front, eart level Swelling hands, feet Emesis this AM SOB  NCM states patient says she feels as if a seizure is coming on and reports last seizure was 2 years ago.  Patient has declined rehab an home health.  Patient lives in a trailer next to sons house (no running water or electricity).  Patient states son is currently ill and daughter is out on long haul trucking. NCM strongly advised ER visit.  Patient was reluctant.  NCM asked [place] to please call patient and convince of the  importance of an ER visit given the described symptoms. I spoke with Nurse clinical team lead Jamie Bullins and she advised that I contact patient and reinforce the need for patient to be seen in the ED. I contacted the patient and advised that it is very important that she follow through with emergency services. Patient stated that she would get her self together and either find someone to take her or contact EMS/911

## 2021-01-09 DIAGNOSIS — F17210 Nicotine dependence, cigarettes, uncomplicated: Secondary | ICD-10-CM | POA: Diagnosis not present

## 2021-01-09 DIAGNOSIS — F1120 Opioid dependence, uncomplicated: Secondary | ICD-10-CM | POA: Diagnosis not present

## 2021-01-09 DIAGNOSIS — M5416 Radiculopathy, lumbar region: Secondary | ICD-10-CM | POA: Diagnosis not present

## 2021-01-09 DIAGNOSIS — M5412 Radiculopathy, cervical region: Secondary | ICD-10-CM | POA: Diagnosis not present

## 2021-01-09 DIAGNOSIS — Z79899 Other long term (current) drug therapy: Secondary | ICD-10-CM | POA: Diagnosis not present

## 2021-01-09 DIAGNOSIS — G8929 Other chronic pain: Secondary | ICD-10-CM | POA: Diagnosis not present

## 2021-01-15 DIAGNOSIS — Z79899 Other long term (current) drug therapy: Secondary | ICD-10-CM | POA: Diagnosis not present

## 2021-02-11 DIAGNOSIS — G8929 Other chronic pain: Secondary | ICD-10-CM | POA: Diagnosis not present

## 2021-02-11 DIAGNOSIS — F17210 Nicotine dependence, cigarettes, uncomplicated: Secondary | ICD-10-CM | POA: Diagnosis not present

## 2021-02-11 DIAGNOSIS — Z79899 Other long term (current) drug therapy: Secondary | ICD-10-CM | POA: Diagnosis not present

## 2021-02-11 DIAGNOSIS — F1120 Opioid dependence, uncomplicated: Secondary | ICD-10-CM | POA: Diagnosis not present

## 2021-02-11 DIAGNOSIS — M5412 Radiculopathy, cervical region: Secondary | ICD-10-CM | POA: Diagnosis not present

## 2021-02-11 DIAGNOSIS — M5416 Radiculopathy, lumbar region: Secondary | ICD-10-CM | POA: Diagnosis not present

## 2021-02-13 DIAGNOSIS — Z79899 Other long term (current) drug therapy: Secondary | ICD-10-CM | POA: Diagnosis not present

## 2021-03-03 DIAGNOSIS — G458 Other transient cerebral ischemic attacks and related syndromes: Secondary | ICD-10-CM | POA: Diagnosis not present

## 2021-03-03 DIAGNOSIS — I700 Atherosclerosis of aorta: Secondary | ICD-10-CM | POA: Diagnosis not present

## 2021-03-03 DIAGNOSIS — E039 Hypothyroidism, unspecified: Secondary | ICD-10-CM | POA: Diagnosis not present

## 2021-03-03 DIAGNOSIS — J441 Chronic obstructive pulmonary disease with (acute) exacerbation: Secondary | ICD-10-CM | POA: Diagnosis not present

## 2021-03-03 DIAGNOSIS — B964 Proteus (mirabilis) (morganii) as the cause of diseases classified elsewhere: Secondary | ICD-10-CM | POA: Diagnosis not present

## 2021-03-03 DIAGNOSIS — T83518A Infection and inflammatory reaction due to other urinary catheter, initial encounter: Secondary | ICD-10-CM | POA: Diagnosis not present

## 2021-03-03 DIAGNOSIS — R339 Retention of urine, unspecified: Secondary | ICD-10-CM | POA: Diagnosis not present

## 2021-03-03 DIAGNOSIS — B9629 Other Escherichia coli [E. coli] as the cause of diseases classified elsewhere: Secondary | ICD-10-CM | POA: Diagnosis not present

## 2021-03-03 DIAGNOSIS — A419 Sepsis, unspecified organism: Secondary | ICD-10-CM | POA: Diagnosis not present

## 2021-03-03 DIAGNOSIS — N390 Urinary tract infection, site not specified: Secondary | ICD-10-CM | POA: Diagnosis not present

## 2021-03-03 DIAGNOSIS — I2510 Atherosclerotic heart disease of native coronary artery without angina pectoris: Secondary | ICD-10-CM | POA: Diagnosis not present

## 2021-03-03 DIAGNOSIS — J449 Chronic obstructive pulmonary disease, unspecified: Secondary | ICD-10-CM | POA: Diagnosis not present

## 2021-03-03 DIAGNOSIS — Z87440 Personal history of urinary (tract) infections: Secondary | ICD-10-CM | POA: Diagnosis not present

## 2021-03-03 DIAGNOSIS — J9611 Chronic respiratory failure with hypoxia: Secondary | ICD-10-CM | POA: Diagnosis not present

## 2021-03-03 DIAGNOSIS — I959 Hypotension, unspecified: Secondary | ICD-10-CM | POA: Diagnosis not present

## 2021-03-03 DIAGNOSIS — J479 Bronchiectasis, uncomplicated: Secondary | ICD-10-CM | POA: Diagnosis not present

## 2021-03-03 DIAGNOSIS — N200 Calculus of kidney: Secondary | ICD-10-CM | POA: Diagnosis not present

## 2021-03-03 DIAGNOSIS — D3502 Benign neoplasm of left adrenal gland: Secondary | ICD-10-CM | POA: Diagnosis not present

## 2021-03-03 DIAGNOSIS — Z87891 Personal history of nicotine dependence: Secondary | ICD-10-CM | POA: Diagnosis not present

## 2021-03-03 DIAGNOSIS — F419 Anxiety disorder, unspecified: Secondary | ICD-10-CM | POA: Diagnosis not present

## 2021-03-03 DIAGNOSIS — B961 Klebsiella pneumoniae [K. pneumoniae] as the cause of diseases classified elsewhere: Secondary | ICD-10-CM | POA: Diagnosis not present

## 2021-03-03 DIAGNOSIS — G629 Polyneuropathy, unspecified: Secondary | ICD-10-CM | POA: Diagnosis not present

## 2021-03-03 DIAGNOSIS — J9612 Chronic respiratory failure with hypercapnia: Secondary | ICD-10-CM | POA: Diagnosis not present

## 2021-03-03 DIAGNOSIS — N319 Neuromuscular dysfunction of bladder, unspecified: Secondary | ICD-10-CM | POA: Diagnosis not present

## 2021-03-03 DIAGNOSIS — J9690 Respiratory failure, unspecified, unspecified whether with hypoxia or hypercapnia: Secondary | ICD-10-CM | POA: Diagnosis not present

## 2021-03-03 DIAGNOSIS — Z66 Do not resuscitate: Secondary | ICD-10-CM | POA: Diagnosis not present

## 2021-03-03 DIAGNOSIS — Z20822 Contact with and (suspected) exposure to covid-19: Secondary | ICD-10-CM | POA: Diagnosis not present

## 2021-03-03 DIAGNOSIS — Z1612 Extended spectrum beta lactamase (ESBL) resistance: Secondary | ICD-10-CM | POA: Diagnosis not present

## 2021-03-03 DIAGNOSIS — E440 Moderate protein-calorie malnutrition: Secondary | ICD-10-CM | POA: Diagnosis not present

## 2021-03-03 DIAGNOSIS — T83511A Infection and inflammatory reaction due to indwelling urethral catheter, initial encounter: Secondary | ICD-10-CM | POA: Diagnosis not present

## 2021-03-03 DIAGNOSIS — E860 Dehydration: Secondary | ICD-10-CM | POA: Diagnosis not present

## 2021-03-03 DIAGNOSIS — G40909 Epilepsy, unspecified, not intractable, without status epilepticus: Secondary | ICD-10-CM | POA: Diagnosis not present

## 2021-03-03 DIAGNOSIS — R4182 Altered mental status, unspecified: Secondary | ICD-10-CM | POA: Diagnosis not present

## 2021-03-03 DIAGNOSIS — R109 Unspecified abdominal pain: Secondary | ICD-10-CM | POA: Diagnosis not present

## 2021-03-03 DIAGNOSIS — T83510A Infection and inflammatory reaction due to cystostomy catheter, initial encounter: Secondary | ICD-10-CM | POA: Diagnosis not present

## 2021-03-03 DIAGNOSIS — F32A Depression, unspecified: Secondary | ICD-10-CM | POA: Diagnosis not present

## 2021-03-03 DIAGNOSIS — G4733 Obstructive sleep apnea (adult) (pediatric): Secondary | ICD-10-CM | POA: Diagnosis not present

## 2021-03-03 DIAGNOSIS — B952 Enterococcus as the cause of diseases classified elsewhere: Secondary | ICD-10-CM | POA: Diagnosis not present

## 2021-03-03 DIAGNOSIS — D509 Iron deficiency anemia, unspecified: Secondary | ICD-10-CM | POA: Diagnosis not present

## 2021-03-03 DIAGNOSIS — R0602 Shortness of breath: Secondary | ICD-10-CM | POA: Diagnosis not present

## 2021-03-03 DIAGNOSIS — R001 Bradycardia, unspecified: Secondary | ICD-10-CM | POA: Diagnosis not present

## 2021-03-03 DIAGNOSIS — E785 Hyperlipidemia, unspecified: Secondary | ICD-10-CM | POA: Diagnosis not present

## 2021-03-11 ENCOUNTER — Telehealth: Payer: Self-pay | Admitting: Family Medicine

## 2021-03-12 NOTE — Telephone Encounter (Signed)
TC w/ Tammy from [facility] She tried several times to get a hold of pt. She had marketing try as well, they finally got a hold of her caregiver, who said pt was not interested in [facility] at this time. [facility] nurse is concerned about pt since she has a superpubic catheter.

## 2021-03-12 NOTE — Telephone Encounter (Signed)
I agree to be concerned for her, if she will not see them then please schedule an appointment for her to be seen ASAP with [place] and maybe we can encourage her to go that direction.  If she will not see [place] or them and we cannot talk to her and just her caregiver and maybe we need to contact [facility]

## 2021-03-12 NOTE — Telephone Encounter (Addendum)
Informed Tasha Clarke with HH of Dr. Merita Norton recommendations.  She agrees.  Tasha Clarke gave niece's number that is taking care of patient currently. [phone]. She is not currently on the HIPAA form.  [phone]- [name]- pt no longer there per receptionist.  [name]- straight to vmail and vmail is full.  Called Tasha Clarke [phone] )who is the step daughter. she does not manage pts care.  Says that since her father died pt has been hard to get in touch with. States that pts son Tasha Clarke does most of pts care. She provided a number for him [phone].  Tried calling the son today. No answer and vmail is full.
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
## 2021-03-19 ENCOUNTER — Other Ambulatory Visit: Payer: Self-pay | Admitting: Family Medicine

## 2021-04-23 ENCOUNTER — Telehealth: Payer: Self-pay | Admitting: Family Medicine

## 2021-04-23 NOTE — Telephone Encounter (Signed)
No answer unable to leave a  message for patient to call back and schedule Medicare Annual Wellness Visit (AWV) to be completed by video or phone.   Last AWV: 12/11/2018     Please schedule at anytime with [facility] Health Advisor.  45 minute appointment  Any questions, please contact me at [phone]

## 2021-12-17 ENCOUNTER — Telehealth: Payer: Self-pay

## 2021-12-17 NOTE — Telephone Encounter (Signed)
Nurse from Dr Sheppard Evens office in [place] called to verify dosage of Keppra.  Advised that we have not seen pt since November 2021. At that time pt was taking '1000mg'$  nightly. Prior to that she was taking '750mg'$  nightly.  Faxed to [phone]

## 2022-08-13 ENCOUNTER — Encounter: Payer: Self-pay | Admitting: Family Medicine

## 2023-01-10 DEATH — deceased

## 2023-05-13 ENCOUNTER — Encounter: Payer: Self-pay | Admitting: Family Medicine

## 2024-02-21 ENCOUNTER — Other Ambulatory Visit: Payer: Self-pay | Admitting: *Deleted
